# Patient Record
Sex: Female | Born: 1935 | Race: White | Hispanic: No | Marital: Single | Smoking: Never smoker
Health system: Southern US, Community
[De-identification: ages and names within clinical notes are randomized; demographics above are authoritative.]

## PROBLEM LIST (undated history)

## (undated) DIAGNOSIS — G809 Cerebral palsy, unspecified: Secondary | ICD-10-CM

## (undated) DIAGNOSIS — E785 Hyperlipidemia, unspecified: Secondary | ICD-10-CM

## (undated) DIAGNOSIS — R4189 Other symptoms and signs involving cognitive functions and awareness: Secondary | ICD-10-CM

## (undated) DIAGNOSIS — K5900 Constipation, unspecified: Secondary | ICD-10-CM

## (undated) DIAGNOSIS — J9601 Acute respiratory failure with hypoxia: Secondary | ICD-10-CM

## (undated) HISTORY — PX: OTHER SURGICAL HISTORY: SHX169

## (undated) HISTORY — PX: TONSILLECTOMY: SUR1361

## (undated) HISTORY — DX: Acute respiratory failure with hypoxia: J96.01

---

## 1998-11-15 ENCOUNTER — Other Ambulatory Visit: Admission: RE | Admit: 1998-11-15 | Discharge: 1998-11-15 | Payer: Self-pay | Admitting: Family Medicine

## 1999-08-25 ENCOUNTER — Encounter: Admission: RE | Admit: 1999-08-25 | Discharge: 1999-08-25 | Payer: Self-pay | Admitting: Family Medicine

## 1999-08-25 ENCOUNTER — Encounter: Payer: Self-pay | Admitting: Family Medicine

## 2001-06-17 ENCOUNTER — Emergency Department (HOSPITAL_COMMUNITY): Admission: EM | Admit: 2001-06-17 | Discharge: 2001-06-17 | Payer: Self-pay

## 2001-06-24 ENCOUNTER — Emergency Department (HOSPITAL_COMMUNITY): Admission: EM | Admit: 2001-06-24 | Discharge: 2001-06-24 | Payer: Self-pay | Admitting: Emergency Medicine

## 2002-11-26 ENCOUNTER — Encounter: Admission: RE | Admit: 2002-11-26 | Discharge: 2002-11-26 | Payer: Self-pay | Admitting: Family Medicine

## 2002-11-26 ENCOUNTER — Encounter: Payer: Self-pay | Admitting: Family Medicine

## 2008-01-28 ENCOUNTER — Encounter: Admission: RE | Admit: 2008-01-28 | Discharge: 2008-01-28 | Payer: Self-pay | Admitting: Family Medicine

## 2009-04-06 ENCOUNTER — Encounter: Admission: RE | Admit: 2009-04-06 | Discharge: 2009-04-06 | Payer: Self-pay | Admitting: Family Medicine

## 2009-08-26 ENCOUNTER — Emergency Department (HOSPITAL_COMMUNITY): Admission: EM | Admit: 2009-08-26 | Discharge: 2009-08-27 | Payer: Self-pay | Admitting: Emergency Medicine

## 2010-01-14 ENCOUNTER — Encounter: Admission: RE | Admit: 2010-01-14 | Discharge: 2010-01-14 | Payer: Self-pay | Admitting: Sports Medicine

## 2011-08-14 DIAGNOSIS — F4321 Adjustment disorder with depressed mood: Secondary | ICD-10-CM | POA: Diagnosis not present

## 2011-08-14 DIAGNOSIS — F411 Generalized anxiety disorder: Secondary | ICD-10-CM | POA: Diagnosis not present

## 2011-08-17 DIAGNOSIS — R062 Wheezing: Secondary | ICD-10-CM | POA: Diagnosis not present

## 2011-08-17 DIAGNOSIS — R498 Other voice and resonance disorders: Secondary | ICD-10-CM | POA: Diagnosis not present

## 2011-08-17 DIAGNOSIS — R05 Cough: Secondary | ICD-10-CM | POA: Diagnosis not present

## 2011-08-21 DIAGNOSIS — J159 Unspecified bacterial pneumonia: Secondary | ICD-10-CM | POA: Diagnosis not present

## 2011-08-21 DIAGNOSIS — F411 Generalized anxiety disorder: Secondary | ICD-10-CM | POA: Diagnosis not present

## 2011-08-21 DIAGNOSIS — I10 Essential (primary) hypertension: Secondary | ICD-10-CM | POA: Diagnosis not present

## 2011-10-05 DIAGNOSIS — G809 Cerebral palsy, unspecified: Secondary | ICD-10-CM | POA: Diagnosis not present

## 2011-10-05 DIAGNOSIS — B351 Tinea unguium: Secondary | ICD-10-CM | POA: Diagnosis not present

## 2011-10-05 DIAGNOSIS — M79609 Pain in unspecified limb: Secondary | ICD-10-CM | POA: Diagnosis not present

## 2011-10-05 DIAGNOSIS — M19079 Primary osteoarthritis, unspecified ankle and foot: Secondary | ICD-10-CM | POA: Diagnosis not present

## 2011-10-09 DIAGNOSIS — F4321 Adjustment disorder with depressed mood: Secondary | ICD-10-CM | POA: Diagnosis not present

## 2011-10-09 DIAGNOSIS — F411 Generalized anxiety disorder: Secondary | ICD-10-CM | POA: Diagnosis not present

## 2011-10-25 DIAGNOSIS — M1990 Unspecified osteoarthritis, unspecified site: Secondary | ICD-10-CM | POA: Diagnosis not present

## 2011-10-25 DIAGNOSIS — K5900 Constipation, unspecified: Secondary | ICD-10-CM | POA: Diagnosis not present

## 2011-10-25 DIAGNOSIS — G809 Cerebral palsy, unspecified: Secondary | ICD-10-CM | POA: Diagnosis not present

## 2011-10-25 DIAGNOSIS — F411 Generalized anxiety disorder: Secondary | ICD-10-CM | POA: Diagnosis not present

## 2011-11-27 DIAGNOSIS — F411 Generalized anxiety disorder: Secondary | ICD-10-CM | POA: Diagnosis not present

## 2011-11-30 DIAGNOSIS — E875 Hyperkalemia: Secondary | ICD-10-CM | POA: Diagnosis not present

## 2011-11-30 DIAGNOSIS — D649 Anemia, unspecified: Secondary | ICD-10-CM | POA: Diagnosis not present

## 2011-12-01 DIAGNOSIS — R059 Cough, unspecified: Secondary | ICD-10-CM | POA: Diagnosis not present

## 2011-12-01 DIAGNOSIS — R498 Other voice and resonance disorders: Secondary | ICD-10-CM | POA: Diagnosis not present

## 2011-12-01 DIAGNOSIS — R05 Cough: Secondary | ICD-10-CM | POA: Diagnosis not present

## 2011-12-01 DIAGNOSIS — J069 Acute upper respiratory infection, unspecified: Secondary | ICD-10-CM | POA: Diagnosis not present

## 2011-12-18 DIAGNOSIS — F411 Generalized anxiety disorder: Secondary | ICD-10-CM | POA: Diagnosis not present

## 2012-01-24 DIAGNOSIS — H3530 Unspecified macular degeneration: Secondary | ICD-10-CM | POA: Diagnosis not present

## 2012-01-24 DIAGNOSIS — H04129 Dry eye syndrome of unspecified lacrimal gland: Secondary | ICD-10-CM | POA: Diagnosis not present

## 2012-01-24 DIAGNOSIS — H52209 Unspecified astigmatism, unspecified eye: Secondary | ICD-10-CM | POA: Diagnosis not present

## 2012-01-24 DIAGNOSIS — Z961 Presence of intraocular lens: Secondary | ICD-10-CM | POA: Diagnosis not present

## 2012-01-28 ENCOUNTER — Encounter (HOSPITAL_COMMUNITY): Payer: Self-pay | Admitting: Emergency Medicine

## 2012-01-28 ENCOUNTER — Encounter (HOSPITAL_COMMUNITY)
Admission: EM | Disposition: A | Discharge status: Skilled Nursing Facility | Payer: Self-pay | Source: Ambulatory Visit | Attending: Internal Medicine

## 2012-01-28 ENCOUNTER — Inpatient Hospital Stay (HOSPITAL_COMMUNITY)
Admission: EM | Admit: 2012-01-28 | Discharge: 2012-01-31 | DRG: 382 | Disposition: A | Discharge status: Skilled Nursing Facility | Payer: Medicare Other | Source: Ambulatory Visit | Attending: Internal Medicine | Admitting: Internal Medicine

## 2012-01-28 DIAGNOSIS — Z5189 Encounter for other specified aftercare: Secondary | ICD-10-CM

## 2012-01-28 DIAGNOSIS — K2210 Ulcer of esophagus without bleeding: Secondary | ICD-10-CM | POA: Diagnosis not present

## 2012-01-28 DIAGNOSIS — K2211 Ulcer of esophagus with bleeding: Secondary | ICD-10-CM | POA: Diagnosis not present

## 2012-01-28 DIAGNOSIS — K920 Hematemesis: Secondary | ICD-10-CM | POA: Diagnosis present

## 2012-01-28 DIAGNOSIS — R58 Hemorrhage, not elsewhere classified: Secondary | ICD-10-CM | POA: Diagnosis not present

## 2012-01-28 DIAGNOSIS — G3184 Mild cognitive impairment, so stated: Secondary | ICD-10-CM | POA: Diagnosis present

## 2012-01-28 DIAGNOSIS — D500 Iron deficiency anemia secondary to blood loss (chronic): Secondary | ICD-10-CM | POA: Diagnosis not present

## 2012-01-28 DIAGNOSIS — K5900 Constipation, unspecified: Secondary | ICD-10-CM | POA: Diagnosis not present

## 2012-01-28 DIAGNOSIS — K259 Gastric ulcer, unspecified as acute or chronic, without hemorrhage or perforation: Secondary | ICD-10-CM | POA: Diagnosis not present

## 2012-01-28 DIAGNOSIS — D62 Acute posthemorrhagic anemia: Secondary | ICD-10-CM | POA: Diagnosis not present

## 2012-01-28 DIAGNOSIS — K2990 Gastroduodenitis, unspecified, without bleeding: Secondary | ICD-10-CM | POA: Diagnosis not present

## 2012-01-28 DIAGNOSIS — E876 Hypokalemia: Secondary | ICD-10-CM | POA: Diagnosis not present

## 2012-01-28 DIAGNOSIS — R112 Nausea with vomiting, unspecified: Secondary | ICD-10-CM | POA: Diagnosis not present

## 2012-01-28 DIAGNOSIS — K2901 Acute gastritis with bleeding: Secondary | ICD-10-CM | POA: Diagnosis not present

## 2012-01-28 DIAGNOSIS — F411 Generalized anxiety disorder: Secondary | ICD-10-CM | POA: Diagnosis not present

## 2012-01-28 DIAGNOSIS — K922 Gastrointestinal hemorrhage, unspecified: Secondary | ICD-10-CM | POA: Diagnosis present

## 2012-01-28 DIAGNOSIS — M2459 Contracture, other specified joint: Secondary | ICD-10-CM | POA: Diagnosis present

## 2012-01-28 DIAGNOSIS — D649 Anemia, unspecified: Secondary | ICD-10-CM | POA: Diagnosis present

## 2012-01-28 DIAGNOSIS — E785 Hyperlipidemia, unspecified: Secondary | ICD-10-CM

## 2012-01-28 DIAGNOSIS — R5381 Other malaise: Secondary | ICD-10-CM | POA: Diagnosis not present

## 2012-01-28 DIAGNOSIS — K449 Diaphragmatic hernia without obstruction or gangrene: Secondary | ICD-10-CM | POA: Diagnosis present

## 2012-01-28 DIAGNOSIS — G809 Cerebral palsy, unspecified: Secondary | ICD-10-CM | POA: Diagnosis present

## 2012-01-28 DIAGNOSIS — I499 Cardiac arrhythmia, unspecified: Secondary | ICD-10-CM | POA: Diagnosis not present

## 2012-01-28 HISTORY — DX: Constipation, unspecified: K59.00

## 2012-01-28 HISTORY — DX: Cerebral palsy, unspecified: G80.9

## 2012-01-28 HISTORY — DX: Hyperlipidemia, unspecified: E78.5

## 2012-01-28 HISTORY — DX: Hematemesis: K92.0

## 2012-01-28 HISTORY — PX: ESOPHAGOGASTRODUODENOSCOPY: SHX5428

## 2012-01-28 LAB — CBC WITH DIFFERENTIAL/PLATELET
Basophils Absolute: 0.1 10*3/uL (ref 0.0–0.1)
Basophils Relative: 1 % (ref 0–1)
Eosinophils Absolute: 0.1 10*3/uL (ref 0.0–0.7)
Hemoglobin: 13.7 g/dL (ref 12.0–15.0)
MCH: 29.1 pg (ref 26.0–34.0)
MCHC: 33.4 g/dL (ref 30.0–36.0)
Monocytes Absolute: 0.5 10*3/uL (ref 0.1–1.0)
Monocytes Relative: 7 % (ref 3–12)
Neutrophils Relative %: 68 % (ref 43–77)
RDW: 14.3 % (ref 11.5–15.5)

## 2012-01-28 LAB — TYPE AND SCREEN
ABO/RH(D): O POS
Antibody Screen: NEGATIVE

## 2012-01-28 LAB — COMPREHENSIVE METABOLIC PANEL
Albumin: 2.8 g/dL — ABNORMAL LOW (ref 3.5–5.2)
BUN: 15 mg/dL (ref 6–23)
Creatinine, Ser: 0.54 mg/dL (ref 0.50–1.10)
Potassium: 3.8 mEq/L (ref 3.5–5.1)
Total Protein: 5.5 g/dL — ABNORMAL LOW (ref 6.0–8.3)

## 2012-01-28 LAB — URINALYSIS, ROUTINE W REFLEX MICROSCOPIC
Glucose, UA: NEGATIVE mg/dL
pH: 7.5 (ref 5.0–8.0)

## 2012-01-28 LAB — URINE MICROSCOPIC-ADD ON

## 2012-01-28 LAB — PROTIME-INR: Prothrombin Time: 13.6 seconds (ref 11.6–15.2)

## 2012-01-28 LAB — HEMOGLOBIN: Hemoglobin: 12.7 g/dL (ref 12.0–15.0)

## 2012-01-28 LAB — OCCULT BLOOD, POC DEVICE: Fecal Occult Bld: POSITIVE

## 2012-01-28 LAB — HEMATOCRIT
HCT: 41.5 % (ref 36.0–46.0)
HCT: 38.2 % (ref 36.0–46.0)

## 2012-01-28 SURGERY — EGD (ESOPHAGOGASTRODUODENOSCOPY)
Anesthesia: Moderate Sedation

## 2012-01-28 MED ORDER — BUTAMBEN-TETRACAINE-BENZOCAINE 2-2-14 % EX AERO
INHALATION_SPRAY | CUTANEOUS | Status: DC | PRN
  Administered 2012-01-28: 2 via TOPICAL

## 2012-01-28 MED ORDER — MIDAZOLAM HCL 10 MG/2ML IJ SOLN
INTRAMUSCULAR | Status: AC
  Filled 2012-01-28: qty 4

## 2012-01-28 MED ORDER — ONDANSETRON HCL 4 MG/2ML IJ SOLN
INTRAMUSCULAR | Status: AC
  Filled 2012-01-28: qty 2

## 2012-01-28 MED ORDER — PANTOPRAZOLE SODIUM 40 MG IV SOLR
40.0000 mg | Freq: Two times a day (BID) | INTRAVENOUS | Status: DC
  Administered 2012-01-28 – 2012-01-31 (×6): 40 mg via INTRAVENOUS
  Filled 2012-01-28 (×8): qty 40

## 2012-01-28 MED ORDER — ONDANSETRON HCL 4 MG/2ML IJ SOLN
4.0000 mg | Freq: Once | INTRAMUSCULAR | Status: AC
  Administered 2012-01-28: 4 mg via INTRAVENOUS

## 2012-01-28 MED ORDER — ONDANSETRON HCL 4 MG/2ML IJ SOLN: 4.0000 mg | Freq: Four times a day (QID) | INTRAMUSCULAR | Status: DC | PRN

## 2012-01-28 MED ORDER — CARBOXYMETHYLCELLULOSE SODIUM 0.5 % OP SOLN: 1.0000 [drp] | Freq: Three times a day (TID) | OPHTHALMIC | Status: DC | PRN

## 2012-01-28 MED ORDER — PANTOPRAZOLE SODIUM 40 MG IV SOLR
40.0000 mg | Freq: Once | INTRAVENOUS | Status: AC
  Administered 2012-01-28: 40 mg via INTRAVENOUS

## 2012-01-28 MED ORDER — POLYVINYL ALCOHOL 1.4 % OP SOLN
1.0000 [drp] | Freq: Three times a day (TID) | OPHTHALMIC | Status: DC | PRN
  Filled 2012-01-28: qty 15

## 2012-01-28 MED ORDER — SODIUM CHLORIDE 0.9 % IJ SOLN
3.0000 mL | Freq: Two times a day (BID) | INTRAMUSCULAR | Status: DC
  Administered 2012-01-29 – 2012-01-31 (×4): 3 mL via INTRAVENOUS

## 2012-01-28 MED ORDER — SODIUM CHLORIDE 0.9 % IV SOLN
INTRAVENOUS | Status: DC
  Administered 2012-01-28: 75 mL via INTRAVENOUS
  Administered 2012-01-29: 06:00:00 via INTRAVENOUS

## 2012-01-28 MED ORDER — ACETAMINOPHEN 325 MG PO TABS
650.0000 mg | ORAL_TABLET | Freq: Four times a day (QID) | ORAL | Status: DC | PRN
  Administered 2012-01-29 – 2012-01-30 (×4): 650 mg via ORAL
  Filled 2012-01-28 (×5): qty 2

## 2012-01-28 MED ORDER — FENTANYL CITRATE 0.05 MG/ML IJ SOLN
INTRAMUSCULAR | Status: AC
  Filled 2012-01-28: qty 4

## 2012-01-28 MED ORDER — MIDAZOLAM HCL 10 MG/2ML IJ SOLN
INTRAMUSCULAR | Status: DC | PRN
  Administered 2012-01-28: 1 mg via INTRAVENOUS
  Administered 2012-01-28: 2 mg via INTRAVENOUS
  Administered 2012-01-28: 1 mg via INTRAVENOUS

## 2012-01-28 MED ORDER — SODIUM CHLORIDE 0.9 % IV SOLN
Freq: Once | INTRAVENOUS | Status: AC
  Administered 2012-01-28: 05:00:00 via INTRAVENOUS

## 2012-01-28 MED ORDER — FENTANYL CITRATE 0.05 MG/ML IJ SOLN
INTRAMUSCULAR | Status: DC | PRN
  Administered 2012-01-28: 25 ug via INTRAVENOUS
  Administered 2012-01-28 (×2): 12.5 ug via INTRAVENOUS

## 2012-01-28 MED ORDER — ALBUTEROL SULFATE (5 MG/ML) 0.5% IN NEBU: 2.5000 mg | INHALATION_SOLUTION | RESPIRATORY_TRACT | Status: DC | PRN

## 2012-01-28 MED ORDER — ONDANSETRON HCL 4 MG PO TABS: 4.0000 mg | ORAL_TABLET | Freq: Four times a day (QID) | ORAL | Status: DC | PRN

## 2012-01-28 MED ORDER — ACETAMINOPHEN 650 MG RE SUPP: 650.0000 mg | Freq: Four times a day (QID) | RECTAL | Status: DC | PRN

## 2012-01-28 MED ORDER — SODIUM CHLORIDE 0.9 % IV SOLN: Freq: Once | INTRAVENOUS | Status: DC

## 2012-01-28 NOTE — H&P (Signed)
PCP:   Oneal Grout, MD   Chief Complaint:  Vomiting black material.  HPI: 76 year old female patient, nursing home resident, with past medical history of cerebral palsy, lower extremity contractures, hyperlipidemia not on medications, no prior GI bleed was sent to the emergency department secondary to vomiting black material. Patient gives history of vomiting about 3 times approximately 3 weeks ago. At that time there was no coffee grounds or blood. This was not associated with abdominal pain or diarrhea. It resolved spontaneously. According to her first cousins at the bedside, this was probably secondary to overeating. Last night at approximately 9:25 PM, patient spontaneously started vomiting black colored material. There was no bright red blood. This was not associated with abdominal pain or black stools. No dizziness or lightheadedness. She had 2 such episodes at the nursing facility and she was transferred to the emergency department for further evaluation and management. In the emergency department she had 2 more episodes and the last one approximately 2 hours ago. The emesis tested positive for occult blood. Rectal exam by ED physician did not reveal melena and tested positive for blood. Patient denies using NSAIDs. Appetite is good. No history of weight loss. Last colonoscopy approximately 15 years ago. Patient has received IV Protonix in the hospitalist service is requested to admit for further evaluation and management. Gastroenterology has been consulted by the EDP.  Past Medical History: Past Medical History  Diagnosis Date  . Constipation   . Cerebral palsy   . Hyperlipidemia     Past Surgical History: Past Surgical History  Procedure Date  . Right hip surgery   . Tonsillectomy     Allergies:  No Known Allergies  Medications: Prior to Admission medications   Medication Sig Start Date End Date Taking? Authorizing Provider  butalbital-acetaminophen-caffeine (FIORICET,  ESGIC) 50-325-40 MG per tablet Take 1 tablet by mouth 2 (two) times daily as needed. headache   Yes Historical Provider, MD  carboxymethylcellulose (REFRESH PLUS) 0.5 % SOLN Place 1 drop into both eyes 3 (three) times daily as needed.   Yes Historical Provider, MD  citalopram (CELEXA) 20 MG tablet Take 20 mg by mouth daily.   Yes Historical Provider, MD  polyethylene glycol (MIRALAX / GLYCOLAX) packet Take 17 g by mouth at bedtime.   Yes Historical Provider, MD  promethazine (PHENERGAN) 25 MG suppository Place 25 mg rectally every 12 (twelve) hours as needed. nausea   Yes Historical Provider, MD    Family History: History reviewed. No pertinent family history. Patient and her first cousins at the bedside deny any family history.  Social History:  reports that she has never smoked. She does not have any smokeless tobacco history on file. She reports that she does not drink alcohol or use illicit drugs. patient is a resident of a skilled nursing facility. She is mostly wheelchair mobile but ambulates with the help of a walker. She has 4 cousins that look out for her and Mr. Marikay Alar is her healthcare power of attorney.  Review of Systems:  All systems reviewed and the proximal history of presenting illness, is negative. Specifically no dysuria or urinary frequency or fever or chills. No chest pain or dyspnea. Occasional headaches.  Physical Exam: Filed Vitals:   01/28/12 0600 01/28/12 0615 01/28/12 0645 01/28/12 0700  BP: 150/89 135/106 134/83 143/78  Pulse: 97 100 99 95  Temp:      TempSrc:      Resp: 19 23 21 18   SpO2: 96% 95% 99% 97%  General appearance: Moderately built and nourished female patient who is lying comfortably in the gurney and is in no obvious distress.  Head: Nontraumatic and normocephalic.  Eyes: Pupils equally reacting to light and accommodation.  Ears: Normal  Nose: No acute findings. No sinus tenderness.  Throat: Mucosa is moist. No oral thrush. No blood or  coffee grounds. Neck: Supple. No JVD or carotid bruit. Lymph nodes: No lymphadenopathy.  Resp: Clear to auscultation. No increased work of breathing.  Cardio: First and second heart sounds heard, regular rate and rythm. No murmurs or JVD or gallop or pedal edema.   GI: Non distended. Soft and nontender. No organomegaly or masses appreciated. Normal bowel sounds heard.  Extremities: Bilateral lower extremity muscle wasting and contractures. Grade 2/2 power at least in lower extremities. Symmetric 5/5 power in upper extremities.  Skin: No other acute findings.  Neurologic: Alert and oriented. No focal neurological deficits.   Labs on Admission:   [facility] 01/28/12 0536  NA 140  K 3.8  CL 107  CO2 24  GLUCOSE 104*  BUN 15  CREATININE 0.54  CALCIUM 8.3*  MG --  PHOS --    Basename 01/28/12 0536  AST 14  ALT 9  ALKPHOS 61  BILITOT 0.3  PROT 5.5*  ALBUMIN 2.8*   No results found for this basename: LIPASE:2,AMYLASE:2 in the last 72 hours  Basename 01/28/12 0536  WBC 7.1  NEUTROABS 4.9  HGB 13.7  HCT 41.0  MCV 87.0  PLT 206   No results found for this basename: CKTOTAL:3,CKMB:3,CKMBINDEX:3,TROPONINI:3 in the last 72 hours No results found for this basename: TSH,T4TOTAL,FREET3,T3FREE,THYROIDAB in the last 72 hours No results found for this basename: VITAMINB12:2,FOLATE:2,FERRITIN:2,TIBC:2,IRON:2,RETICCTPCT:2 in the last 72 hours  Radiological Exams on Admission: No results found.    Assessment/Plan Present on Admission:  .Acute upper GI bleed  1. Acute upper GI bleed: Differential diagnosis: Mallory-Weiss tear, gastritis or peptic ulcer disease (however no other symptoms such as abdominal pain, weight loss or decreased appetite). Admit to telemetry. Clear liquids by mouth. IV PPI. Monitor H&H frequently. GI consult and by EDP-will wait for recommendations-conservative management versus EGD. 2. History of hyperlipidemia: Patient was taken off Lipitor by her previous  PCP Dr. Lupe Carney, secondary to myalgia. 3. History of cerebral palsy with lower extremity contractures and possible mild cognitive impairment 4. Full Code: This was confirmed with the patient in the presence of a family.  Discussed the patient's care with the patient and her cousins Ms. Blondell Reveal and Mr. Julio Storr at the bedside, answered questions and updated care.  Shaima Sardinas 01/28/2012, 8:34 AM

## 2012-01-28 NOTE — ED Notes (Addendum)
MD at bedside. Dr. Hongalgi 

## 2012-01-28 NOTE — ED Provider Notes (Signed)
Medical screening examination/treatment/procedure(s) were conducted as a shared visit with non-physician practitioner(s) and myself.  I personally evaluated the patient during the encounter  Pt with upper GI bleed, though BP stable, HGB stable.  She did have coffee ground emesis here.  She denies pain or other complaints at this time D/w triad to admit Also, d/w dr schooler with GI he is aware of patient BP 143/78  Pulse 95  Temp 98.5 F (36.9 C) (Oral)  Resp 18  SpO2 97%   Joya Gaskins, MD 01/28/12 [phone]

## 2012-01-28 NOTE — Brief Op Note (Addendum)
Large distal esophageal ulcer and edematous and nodular GEJ concerning for a lesion. Biopsies taken. Patient still sedated postprocedure. Clears when awake. IV PPI Q 12 hours. See Endopro note.

## 2012-01-28 NOTE — ED Provider Notes (Signed)
History     CSN: [id number]  Arrival date & time 01/28/12  4098   First MD Initiated Contact with Patient 01/28/12 0440      Chief Complaint  Patient presents with  . Hematemesis    (Consider location/radiation/quality/duration/timing/severity/associated sxs/prior treatment) HPI Comments: Patient here with vomiting dark coffee ground emesis since this evening - patient reports normal appetite this evening ate all of her dinner and snack, she states that starting at 2300 she began to feel nauseous and asked for a nausea pill - states they gave her phenergan suppository instead which has not helped - she states that starting at about 3am she began to vomit - she reports dark black coffee ground emesis x 4 - denies bright red blood - denies fever, chills, abdominal pain, constipation or diarrhea, denies blood in stool recently and reports no prior history of GI bleed - she states she is not on coumadin or blood thinners.  Patient is a 76 y.o. female presenting with vomiting. The history is provided by the patient, the nursing home and the EMS personnel. No language interpreter was used.  Emesis  This is a new problem. The current episode started 1 to 2 hours ago. The problem occurs 2 to 4 times per day. The problem has not changed since onset.Vomiting appearance: black coffee ground emesis. There has been no fever. Pertinent negatives include no abdominal pain, no arthralgias, no chills, no cough, no diarrhea, no fever, no headaches, no myalgias, no sweats and no URI.    Past Medical History  Diagnosis Date  . Constipation   . Cerebral palsy     History reviewed. No pertinent past surgical history.  History reviewed. No pertinent family history.  History  Substance Use Topics  . Smoking status: Never Smoker   . Smokeless tobacco: Not on file  . Alcohol Use: No    OB History    Grav Para Term Preterm Abortions TAB SAB Ect Mult Living                  Review of Systems    Constitutional: Negative for fever and chills.  HENT: Negative for neck pain.   Eyes: Negative for pain.  Respiratory: Negative for cough.   Gastrointestinal: Positive for nausea and vomiting. Negative for abdominal pain, diarrhea, blood in stool and rectal pain.  Genitourinary: Negative for dysuria.  Musculoskeletal: Negative for myalgias and arthralgias.  Neurological: Negative for headaches.  All other systems reviewed and are negative.    Allergies  Review of patient's allergies indicates no known allergies.  Home Medications  No current outpatient prescriptions on file.  BP 149/85  Pulse 104  Temp 98.5 F (36.9 C) (Oral)  Resp 27  SpO2 97%  Physical Exam  Nursing note and vitals reviewed. Constitutional: She is oriented to person, place, and time. She appears well-developed and well-nourished. No distress.  HENT:  Head: Normocephalic and atraumatic.  Right Ear: External ear normal.  Left Ear: External ear normal.  Nose: Nose normal.  Mouth/Throat: Oropharynx is clear and moist. No oropharyngeal exudate.  Eyes: Conjunctivae are normal. Pupils are equal, round, and reactive to light. No scleral icterus.  Neck: Normal range of motion. Neck supple.  Cardiovascular: Normal rate, regular rhythm and normal heart sounds.  Exam reveals no gallop and no friction rub.   No murmur heard. Pulmonary/Chest: Effort normal and breath sounds normal. No respiratory distress. She has no wheezes. She has no rales. She exhibits no tenderness.  Abdominal: Soft.  Bowel sounds are normal. She exhibits no distension and no mass. There is no tenderness. There is no rebound and no guarding.  Musculoskeletal: Normal range of motion. She exhibits no edema and no tenderness.  Lymphadenopathy:    She has no cervical adenopathy.  Neurological: She is alert and oriented to person, place, and time. No cranial nerve deficit. She exhibits normal muscle tone. Coordination normal.  Skin: Skin is warm and  dry. No rash noted. No erythema. No pallor.  Psychiatric: She has a normal mood and affect. Her behavior is normal. Judgment and thought content normal.    ED Course  Procedures (including critical care time)  Labs Reviewed - No data to display No results found.   No diagnosis found.    MDM  Patient here with upper and lower GI bleed acutely - she has no prior history of GI bleed, denies any pain currently but feel that she will likely need admission for this as it is a new event.  As she is not having pain, I do not feel that imaging is necessary at this time - patient to be followed by Dr. Bebe Shaggy who will get the patient admitted.        Izola Price [place], [place] 01/28/12 [phone]

## 2012-01-28 NOTE — ED Notes (Addendum)
Per EMS:  Pt comes from [facility] and has been vomiting dark red blood since 2100 yesterday about 3-4 episodes of emesis.    Pt has had 4mg  zofran PTA

## 2012-01-28 NOTE — ED Notes (Signed)
Put pt on a bedpan, when getting her off she started vomiting. Pt was cleaned up and put a new gown on.

## 2012-01-28 NOTE — Progress Notes (Signed)
01/28/12 0836  OTHER  CSW Follow Up Status Follow-up required     Unit based LCSW to be consulted as pt is from [facility].   Dionne Milo MSW [facility] Emergency Dept. Weekend/Social Worker [phone]

## 2012-01-28 NOTE — ED Notes (Signed)
Family at bedside. 

## 2012-01-28 NOTE — Interval H&P Note (Signed)
History and Physical Interval Note:  01/28/2012 1:42 PM  [name]  has presented today for surgery, with the diagnosis of coffee ground emesis  The various methods of treatment have been discussed with the patient and family. After consideration of risks, benefits and other options for treatment, the patient has consented to  Procedure(s) (LRB): ESOPHAGOGASTRODUODENOSCOPY (EGD) (N/A) as a surgical intervention .  The patient's history has been reviewed, patient examined, no change in status, stable for surgery.  I have reviewed the patients' chart and labs.  Questions were answered to the patient's satisfaction.     Jody Silas C.

## 2012-01-28 NOTE — Progress Notes (Signed)
01/28/12 0835  Discharge Planning  Type of Residence Skilled nursing facility  Care Facility Name [facility] No  Support Systems Other relatives Tajuanna Burnett [phone])  Do you have any problems obtaining your medications? No  Family/patient expects to be discharged to: Skilled nursing facility  Once you are discharged, how will you get to your follow-up appointment? Other (Comment) (Facility)  Expected Discharge Date 02/02/12  Case Management Consult Needed No  Social Work Consult Needed Yes (Comment)

## 2012-01-28 NOTE — Consult Note (Signed)
Referring Provider: Dr. Zadie Rhine Primary Care Physician:  Oneal Grout, MD Primary Gastroenterologist:  Gentry Fitz  Reason for Consultation:  Coffee grounds emesis  HPI: [name] is a 76 y.o. female with cerebral palsy who is accompanied by two of her cousins and had the acute onset of coffee grounds emesis last night at "9:25pm" The vomiting of the black fluid occurred twice at the nursing home and then occurred again in the ER at 0600AM and was described by her family as a large amount. Denies any associated abdominal pain, denies melena/hematochezia. Denies any preceding nausea. No NSAIDs. Never had this happen before. Thinks she had an EGD years ago and had a colonoscopy over 15 years ago. Reports having non-bloody vomiting 3 weeks ago that she thinks was due to overeating.      Past Medical History  Diagnosis Date  . Constipation   . Cerebral palsy   . Hyperlipidemia     Past Surgical History  Procedure Date  . Right hip surgery   . Tonsillectomy     Prior to Admission medications   Medication Sig Start Date End Date Taking? Authorizing Provider  butalbital-acetaminophen-caffeine (FIORICET, ESGIC) 50-325-40 MG per tablet Take 1 tablet by mouth 2 (two) times daily as needed. headache   Yes Historical Provider, MD  carboxymethylcellulose (REFRESH PLUS) 0.5 % SOLN Place 1 drop into both eyes 3 (three) times daily as needed.   Yes Historical Provider, MD  citalopram (CELEXA) 20 MG tablet Take 20 mg by mouth daily.   Yes Historical Provider, MD  polyethylene glycol (MIRALAX / GLYCOLAX) packet Take 17 g by mouth at bedtime.   Yes Historical Provider, MD  promethazine (PHENERGAN) 25 MG suppository Place 25 mg rectally every 12 (twelve) hours as needed. nausea   Yes Historical Provider, MD    Scheduled Meds:   . sodium chloride   Intravenous Once  . ondansetron (ZOFRAN) IV  4 mg Intravenous Once  . pantoprazole (PROTONIX) IV  40 mg Intravenous Once   Continuous  Infusions:  PRN Meds:.  Allergies as of 01/28/2012  . (No Known Allergies)    History reviewed. No pertinent family history.  History   Social History  . Marital Status: Single    Spouse Name: N/A    Number of Children: N/A  . Years of Education: N/A   Occupational History  . Not on file.   Social History Main Topics  . Smoking status: Never Smoker   . Smokeless tobacco: Not on file  . Alcohol Use: No  . Drug Use: No  . Sexually Active:    Other Topics Concern  . Not on file   Social History Narrative  . No narrative on file    Review of Systems: All negative except as stated above in HPI.  Physical Exam: Vital signs: Filed Vitals:   01/28/12 1019  BP: 124/72  Pulse: 86  Temp: 98.5  Resp: 18     General:   Elderly, Awake, Alert,  Thin, lower extremity contractures, NAD  Lungs:  Clear throughout to auscultation.   No wheezes, crackles, or rhonchi. No acute distress. Heart:  Regular rate and rhythm; no murmurs, clicks, rubs,  or gallops. Abdomen: soft, NT, ND, +BS  Rectal:  Deferred Ext: LE contractures, no edema  GI:  Lab Results:  Basename 01/28/12 0536  WBC 7.1  HGB 13.7  HCT 41.0  PLT 206   BMET  Basename 01/28/12 0536  NA 140  K 3.8  CL 107  CO2 24  GLUCOSE 104*  BUN 15  CREATININE 0.54  CALCIUM 8.3*   LFT  Basename 01/28/12 0536  PROT 5.5*  ALBUMIN 2.8*  AST 14  ALT 9  ALKPHOS 61  BILITOT 0.3  BILIDIR --  IBILI --   PT/INR  Basename 01/28/12 0830  LABPROT 13.6  INR 1.02     Studies/Results: No results found.  Impression/Plan: 76yo with coffee grounds emesis X 3 without any abdominal pain or melena and no history of ulcers or NSAIDs. EGD planned for today to look for peptic ulcer disease. Continue IV PPI Q 12 hours. NPO. IVFs.    LOS: 0 days   Doniven Vanpatten C.  01/28/2012, 11:07 AM

## 2012-01-28 NOTE — Op Note (Signed)
[facility] [address] [place], [place]  [address]  ENDOSCOPY PROCEDURE REPORT  PATIENT:  [name], [name]  MR#:  [id number] BIRTHDATE:  05/06/1936, 76 yrs. old  GENDER:  female  ENDOSCOPIST:  Charlott Rakes, MD Referred by:  PROCEDURE DATE:  01/28/2012 PROCEDURE:  EGD with biopsy, 43239 ASA CLASS:  Class II INDICATIONS:  hematemesis  MEDICATIONS:   Fentanyl 50 mcg IV, Versed 4 mg IV, Cetacaine spray x 2  TOPICAL ANESTHETIC:  DESCRIPTION OF PROCEDURE:   After the risks benefits and alternatives of the procedure were thoroughly explained, informed consent was obtained.  The Pentax Gastroscope [id number] endoscope was introduced through the mouth and advanced to the second portion of the duodenum, without limitations.  The instrument was slowly withdrawn as the mucosa was fully examined. <<PROCEDUREIMAGES>>  FINDINGS:  The endoscope was inserted into the oropharynx and esophagus was intubated.  The gastroesophageal junction was noted to be 30 cm from the incisors. Endoscope was advanced into the stomach  which revealed clear fluid in the stomach without any mucosal abnormalities The endoscope was advanced to the duodenal bulb and second portion of duodenum which were unremarkable.  The endoscope was withdrawn back into the stomach and retroflexion was done which revealed edematous mucosa in the cardia with a medium-sized hiatal hernia and normal-appearing fundus. The endoscope was straightened and upon withdrawing back into the GE junction it was noted to be ulcerated circumferentially with an area of edema and erythema that covered half of the circumference of the lumen. This area was nodular and concerning for a gastroesophageal lesion. It was noted at the proximal portion of the hiatal hernia at the Z-line. The hiatal hernia extended from 30 cm from the incisors to 35 cm from the incisors. The circumferential esophageal ulcer was adjacent to  this gastroesophageal junction lesion. The circumferential ulcer extended from 30 cm to 25 cm from the incisors. Biopsies were taken of the lesion to send for histologic purposes in appearance of the lesion was concerning for adenocarcinoma.  COMPLICATIONS:  None  ENDOSCOPIC IMPRESSION:  1. Gastroesophageal junction lesion - status post biopsies of lesion 2. Large distal esophageal ulcer 3. Medium-sized hiatal hernia  RECOMMENDATIONS:    1. F/U on path 2. IV PPI Q 12 hours 3. Clear liquid diet  REPEAT EXAM:  N/A  ______________________________ Charlott Rakes, MD  CC:  n. eSIGNEDCharlott Rakes at 01/28/2012 02:26 PM  Page 2 of 3   Aquilla Hacker, [phone]

## 2012-01-29 ENCOUNTER — Encounter (HOSPITAL_COMMUNITY): Payer: Self-pay | Admitting: Gastroenterology

## 2012-01-29 DIAGNOSIS — E876 Hypokalemia: Secondary | ICD-10-CM | POA: Diagnosis not present

## 2012-01-29 DIAGNOSIS — K922 Gastrointestinal hemorrhage, unspecified: Secondary | ICD-10-CM

## 2012-01-29 DIAGNOSIS — D649 Anemia, unspecified: Secondary | ICD-10-CM

## 2012-01-29 DIAGNOSIS — K2210 Ulcer of esophagus without bleeding: Secondary | ICD-10-CM | POA: Diagnosis present

## 2012-01-29 HISTORY — DX: Hypokalemia: E87.6

## 2012-01-29 LAB — CBC
HCT: 35.7 % — ABNORMAL LOW (ref 36.0–46.0)
Hemoglobin: 11.8 g/dL — ABNORMAL LOW (ref 12.0–15.0)
MCH: 29.1 pg (ref 26.0–34.0)
MCV: 87.9 fL (ref 78.0–100.0)
Platelets: 207 10*3/uL (ref 150–400)
RBC: 4.06 MIL/uL (ref 3.87–5.11)
WBC: 6.1 10*3/uL (ref 4.0–10.5)

## 2012-01-29 LAB — BASIC METABOLIC PANEL
CO2: 23 mEq/L (ref 19–32)
Calcium: 8 mg/dL — ABNORMAL LOW (ref 8.4–10.5)
Chloride: 109 mEq/L (ref 96–112)
Glucose, Bld: 88 mg/dL (ref 70–99)
Potassium: 3.1 mEq/L — ABNORMAL LOW (ref 3.5–5.1)
Sodium: 141 mEq/L (ref 135–145)

## 2012-01-29 MED ORDER — POTASSIUM CHLORIDE CRYS ER 20 MEQ PO TBCR
40.0000 meq | EXTENDED_RELEASE_TABLET | Freq: Once | ORAL | Status: AC
  Administered 2012-01-29: 40 meq via ORAL
  Filled 2012-01-29: qty 2

## 2012-01-29 MED ORDER — POLYETHYLENE GLYCOL 3350 17 G PO PACK
17.0000 g | PACK | Freq: Every day | ORAL | Status: DC
  Administered 2012-01-29 – 2012-01-30 (×2): 17 g via ORAL
  Filled 2012-01-29 (×3): qty 1

## 2012-01-29 MED ORDER — CITALOPRAM HYDROBROMIDE 20 MG PO TABS
20.0000 mg | ORAL_TABLET | Freq: Every day | ORAL | Status: DC
  Administered 2012-01-29 – 2012-01-31 (×3): 20 mg via ORAL
  Filled 2012-01-29 (×3): qty 1

## 2012-01-29 NOTE — Clinical Social Work Psychosocial (Signed)
     Clinical Social Work Department BRIEF PSYCHOSOCIAL ASSESSMENT 01/29/2012  Patient:  [name], [name]     Account Number:  [id number]     Admit date:  01/28/2012  Clinical Social Worker:  Jacelyn Grip  Date/Time:  01/29/2012 03:30 PM  Referred by:  CSW  Date Referred:  01/29/2012 Referred for  SNF Placement   Other Referral:   Interview type:  Patient Other interview type:   and patient cousin, Karoline Fleer    PSYCHOSOCIAL DATA Living Status:  FACILITY Admitted from facility:  [facility] Level of care:  Skilled Nursing Facility Primary support name:  Vernona Rieger Ildefonso/cousin/[phone] Primary support relationship to patient:  FAMILY Degree of support available:   adequate    CURRENT CONCERNS Current Concerns  Post-Acute Placement   Other Concerns:    SOCIAL WORK ASSESSMENT / PLAN CSW reviewed chart and noted pt admitted from [facility]. CSW met with pt at bedside to discuss disposition planning. Pt confirmed that she is currently a resident at [facility] and is interested in exploring other facilities. Pt reported a few concerns that she has at facility. Pt provided permission for this CSW to contact pt cousin, Lyncoln Maskell to discuss. CSW contacted pt cousin, Alece Koppel who discussed that pt was placed into [facility] from home and pt is long term at [facility]. Pt cousin stated that pt has not used any Medicare days for placement and would like to clarify with other facilities if Medicare days could be used if Medicaid bed is not immediately available. Pt and pt cousin both report interest in [facility] and Rehab. CSW completed FL2 and initiated SNF search in [facility] with the exemption of [employer]. CSW to follow up with pt and pt cousin in regard to bed offers. CSW notified MD of pt interest in exploring other facilities and requested for pt to have PT evaluation. CSW to facilitate pt discharge needs when pt medically  stable for discharge.   Assessment/plan status:  Psychosocial Support/Ongoing Assessment of Needs Other assessment/ plan:   discharge planning   Information/referral to community resources:   [facility] list    PATIENTS/FAMILYS RESPONSE TO PLAN OF CARE: Pt alert and oriented x 4. Pt stated that she has been a resident at [facility] since May 2012 and hopeful for a new placement. Pt niece appears supportive and actively involved in pt care.

## 2012-01-29 NOTE — Progress Notes (Signed)
Subjective:   No further episodes of vomiting since yesterday morning. Tolerating clear liquids. Denies any other complaints. Last BM 2 days ago.  Objective  Vital signs in last 24 hours: Filed Vitals:   01/28/12 1550 01/28/12 2105 01/29/12 0621 01/29/12 1415  BP: 104/55 99/64 103/64 121/72  Pulse:  72 81 81  Temp:  98.2 F (36.8 C) 98.8 F (37.1 C) 98.1 F (36.7 C)  TempSrc:  Oral Oral Oral  Resp: 15 16 18 18   Height:      Weight:      SpO2: 96% 91% 91% 92%   Weight change:   Intake/Output Summary (Last 24 hours) at 01/29/12 1426 Last data filed at 01/29/12 0859  Gross per 24 hour  Intake   1180 ml  Output      0 ml  Net   1180 ml    Physical Exam:  General Exam: Comfortable.  Respiratory System: Clear. No increased work of breathing.  Cardiovascular System: First and second heart sounds heard. Regular rate and rhythm. No JVD/murmurs.  Gastrointestinal System: Abdomen is non distended, soft and normal bowel sounds heard. Non-tender. Central Nervous System: Alert and oriented. No focal neurological deficits. Extremities: Bilateral lower extremity contractures.  Labs:  Basic Metabolic Panel:  Lab 01/28/12 1610 01/28/12 0536  NA 141 140  K 3.1* 3.8  CL 109 107  CO2 23 24  GLUCOSE 88 104*  BUN 8 15  CREATININE 0.55 0.54  CALCIUM 8.0* 8.3*  ALB -- --  PHOS -- --   Liver Function Tests:  Lab 01/28/12 0536  AST 14  ALT 9  ALKPHOS 61  BILITOT 0.3  PROT 5.5*  ALBUMIN 2.8*   No results found for this basename: LIPASE:3,AMYLASE:3 in the last 168 hours No results found for this basename: AMMONIA:3 in the last 168 hours CBC:  Lab 01/28/12 2306 01/28/12 2030 01/28/12 1200 01/28/12 0536  WBC 6.1 -- -- 7.1  NEUTROABS -- -- -- 4.9  HGB 11.8* 12.7 13.7 --  HCT 35.7* 38.2 41.5 --  MCV 87.9 -- -- 87.0  PLT 207 -- -- 206   Cardiac Enzymes: No results found for this basename: CKTOTAL:5,CKMB:5,CKMBINDEX:5,TROPONINI:5 in the last 168 hours CBG: No results  found for this basename: GLUCAP:5 in the last 168 hours  Iron Studies: No results found for this basename: IRON,TIBC,TRANSFERRIN,FERRITIN in the last 72 hours Studies/Results: No results found. Medications:    . DISCONTD: sodium chloride 75 mL/hr at 01/29/12 0531      . citalopram  20 mg Oral Daily  . pantoprazole (PROTONIX) IV  40 mg Intravenous Q12H  . polyethylene glycol  17 g Oral QHS  . potassium chloride  40 mEq Oral Once  . sodium chloride  3 mL Intravenous Q12H  . DISCONTD: sodium chloride   Intravenous Once    I  have reviewed scheduled and prn medications.     Problem/Plan: Principal Problem:  *Acute upper GI bleed Active Problems:  Hematemesis  1. Acute upper GI bleed: Secondary to distal esophageal ulcer. EGD also showed nodular GEJ concerning for malignancy-biopsy pending. No further episodes of vomiting or hematemesis. Advancing to full liquid diet. Continue IV twice a day PPI. GI following. 2. Hypokalemia: Replete and follow BMP in a.m. 3. Anemia:? Dilutional. No overt bleeding. Follow up CBC in a.m. 4. History of hyperlipidemia: Patient was taken off Lipitor by her previous PCP Dr. Lupe Carney, secondary to myalgia. 5. History of cerebral palsy with lower extremity contractures and possible mild cognitive impairment  6. Full Code: This was confirmed with the patient in the presence of a family  Disposition: If stable, possible discharge 7/2.  [name] 01/29/2012,2:26 PM  LOS: 1 day

## 2012-01-29 NOTE — Progress Notes (Signed)
Patient ID: [name], female   DOB: 1935-10-26, 76 y.o.   MRN: [id number] [facility] Gastroenterology Progress Note  [name] 76 y.o. 1936-03-12   Subjective: Sitting up in bed and just finished clear liquids. Tolerated well without nausea or pain. Denies abdominal pain. No BMs or flatus.  Objective: Vital signs: Filed Vitals:   01/29/12 0621  BP: 103/64  Pulse: 81  Temp: 98.8 F (37.1 C)  Resp: 18    Physical Exam: Gen: alert, elderly, no acute distress   Lab Results:  [facility] 01/28/12 2306 01/28/12 0536  NA 141 140  K 3.1* 3.8  CL 109 107  CO2 23 24  GLUCOSE 88 104*  BUN 8 15  CREATININE 0.55 0.54  CALCIUM 8.0* 8.3*  MG -- --  PHOS -- --    Basename 01/28/12 0536  AST 14  ALT 9  ALKPHOS 61  BILITOT 0.3  PROT 5.5*  ALBUMIN 2.8*    Basename 01/28/12 2306 01/28/12 2030 01/28/12 0536  WBC 6.1 -- 7.1  NEUTROABS -- -- 4.9  HGB 11.8* 12.7 --  HCT 35.7* 38.2 --  MCV 87.9 -- 87.0  PLT 207 -- 206      Assessment/Plan: 76yo with distal esophageal ulcer and nodular GEJ concerning for malignancy. S/P biopsies that are pending. No more hematemesis. Continue IV PPI Q 12 hours. Will change to full liquids now and if tolerates advance further tomorrow.   Jaysiah Marchetta C. 01/29/2012, 1:08 PM

## 2012-01-29 NOTE — Care Management Note (Signed)
    Page 1 of 1   01/31/2012     1:15:40 PM   CARE MANAGEMENT NOTE 01/31/2012  Patient:  [name], [name]   Account Number:  [id number]  Date Initiated:  01/29/2012  Documentation initiated by:  Letha Cape  Subjective/Objective Assessment:   dx upper gib  admit- from [facility] SNF     Action/Plan:   Anticipated DC Date:  01/31/2012   Anticipated DC Plan:  SKILLED NURSING FACILITY  In-house referral  Clinical Social Worker      DC Planning Services  CM consult      Choice offered to / List presented to:             Status of service:  Completed, signed off Medicare Important Message given?   (If response is "NO", the following Medicare IM given date fields will be blank) Date Medicare IM given:   Date Additional Medicare IM given:    Discharge Disposition:  SKILLED NURSING FACILITY  Per UR Regulation:  Reviewed for med. necessity/level of care/duration of stay  If discussed at Long Length of Stay Meetings, dates discussed:    Comments:  01/31/12 13:14 Letha Cape RN, BSN [phone] patient dc to snf today.  01/29/12 14:31 Letha Cape RN, BSN [phone] patient is from [facility], CSW referral.

## 2012-01-29 NOTE — Plan of Care (Signed)
Problem: Phase I Progression Outcomes Goal: Initial discharge plan identified Outcome: Completed/Met Date Met:  01/29/12 Patient to return to snf when medically cleared.

## 2012-01-29 NOTE — Progress Notes (Addendum)
Clinical Social Work Department CLINICAL SOCIAL WORK PLACEMENT NOTE 01/29/2012  Patient:  [name], [name]  Account Number:  [id number] Admit date:  01/28/2012  Clinical Social Worker:  Jacelyn Grip  Date/time:  01/29/2012 04:45 PM  Clinical Social Work is seeking post-discharge placement for this patient at the following level of care:   SKILLED NURSING   (*CSW will update this form in Epic as items are completed)   01/29/2012  Patient/family provided with [facility] Department of Clinical Social Work's list of facilities offering this level of care within the geographic area requested by the patient (or if unable, by the patient's family).  01/29/2012  Patient/family informed of their freedom to choose among providers that offer the needed level of care, that participate in Medicare, Medicaid or managed care program needed by the patient, have an available bed and are willing to accept the patient.  01/29/2012  Patient/family informed of [facility]' ownership interest in [facility], as well as of the fact that they are under no obligation to receive care at this facility.  PASARR submitted to EDS on  PASARR number received from EDS on   FL2 transmitted to all facilities in geographic area requested by pt/family on  01/29/2012 FL2 transmitted to all facilities within larger geographic area on 01/30/2012  Patient informed that his/her managed care company has contracts with or will negotiate with  certain facilities, including the following:     Patient/family informed of bed offers received:  01/30/2012 Patient chooses bed at [facility] Physician recommends and patient chooses bed at  SNF  Patient to be transferred to  on  01/31/2012 Patient to be transferred to facility by   EMS  The following physician request were entered in Epic:   Additional Comments:   Jacklynn Lewis, MSW, [facility]  Clinical Social Work [phone]

## 2012-01-30 LAB — BASIC METABOLIC PANEL
BUN: 6 mg/dL (ref 6–23)
Creatinine, Ser: 0.58 mg/dL (ref 0.50–1.10)
GFR calc Af Amer: >90 mL/min (ref >90)
GFR calc non Af Amer: 89 mL/min — ABNORMAL LOW (ref >90)
Glucose, Bld: 89 mg/dL (ref 70–99)
Potassium: 4.2 mEq/L (ref 3.5–5.1)

## 2012-01-30 LAB — CBC
HCT: 38 % (ref 36.0–46.0)
Hemoglobin: 12.5 g/dL (ref 12.0–15.0)
MCH: 28.6 pg (ref 26.0–34.0)
MCHC: 32.9 g/dL (ref 30.0–36.0)
MCV: 87 fL (ref 78.0–100.0)
RDW: 14.2 % (ref 11.5–15.5)

## 2012-01-30 MED ORDER — SUCRALFATE 1 GM/10ML PO SUSP
1.0000 g | Freq: Three times a day (TID) | ORAL | Status: DC
  Administered 2012-01-30 – 2012-01-31 (×4): 1 g via ORAL
  Filled 2012-01-30 (×7): qty 10

## 2012-01-30 NOTE — Progress Notes (Signed)
Patient ID: [name], female   DOB: 23-Dec-1935, 76 y.o.   MRN: [id number] [facility] Gastroenterology Progress Note  LORELEY SCHWALL 76 y.o. 1935-12-26   Subjective: Doing well, tolerating full liquids. Biopsies showed ulcerated mucosa without malignancy. No H. Pylori.  Objective: Vital signs in last 24 hours: Filed Vitals:   01/30/12 0558  BP: 123/74  Pulse: 69  Temp: 98.2 F (36.8 C)  Resp: 18    Physical Exam: Gen: alert, no acute distress Abd: soft, NT, ND, +BS  Lab Results:  [facility] 01/30/12 0617 01/28/12 2306  NA 143 141  K 4.2 3.1*  CL 109 109  CO2 27 23  GLUCOSE 89 88  BUN 6 8  CREATININE 0.58 0.55  CALCIUM 8.7 8.0*  MG -- --  PHOS -- --    Basename 01/28/12 0536  AST 14  ALT 9  ALKPHOS 61  BILITOT 0.3  PROT 5.5*  ALBUMIN 2.8*    Basename 01/30/12 0617 01/28/12 2306 01/28/12 0536  WBC 4.8 6.1 --  NEUTROABS -- -- 4.9  HGB 12.5 11.8* --  HCT 38.0 35.7* --  MCV 87.0 87.9 --  PLT 205 207 --    Basename 01/28/12 0830  LABPROT 13.6  INR 1.02      Assessment/Plan: 76yo s/p hematemesis that has resolved and was found on EGD to have a large distal esophageal ulcer. Biopsies were benign. Will add Carafate and continue IV PPI until on solid food. Advance to soft food for dinner and advance further as tolerated. As outpt needs PPI PO BID and Carafate QID until seen by me in f/u. May need an EUS vs. Repeat EGD in 3-4 weeks to reevaluate esophageal ulcer and repeat biopsies, will do sooner if GI bleeding recurs. Patient in good spirits now since I told her the results of the biopsies. Please have her f/u with me in 3-4 weeks. Will sign off. Call us back if questions.   Samnang Shugars C. 01/30/2012, 12:26 PM

## 2012-01-30 NOTE — Progress Notes (Signed)
Clinical Social Worker continuing to follow for disposition planning. Clinical Social Worker spoke to pt at bedside and spoke to pt cousin, Vernona Rieger via telephone. Clinical Social Worker provided SNF bed offers. Pt cousin visited some other facilities and pt and pt cousin preference would be for pt to transfer to [facility] if availability. Clinical Social Worker spoke to [facility] and they do not have any availability at this time. Clinical Child psychotherapist discussed with pt and pt cousin and they would like pt to return to [facility] if [facility] has no availability. Clinical Social Worker notified [facility] and they can accept pt back on 7/3. Clinical Social Worker to facilitate pt discharge needs.  Jacklynn Lewis, MSW, LCSWA  Clinical Social Work [phone]

## 2012-01-30 NOTE — Progress Notes (Addendum)
Subjective:   No further episodes of vomiting. Mild headache and nausea. Had a normal BM today.  Objective  Vital signs in last 24 hours: Filed Vitals:   01/29/12 1415 01/29/12 2344 01/30/12 0558 01/30/12 1300  BP: 121/72 111/69 123/74 116/74  Pulse: 81 72 69 86  Temp: 98.1 F (36.7 C) 98.1 F (36.7 C) 98.2 F (36.8 C) 98.7 F (37.1 C)  TempSrc: Oral Oral Oral Oral  Resp: 18 16 18 18   Height:      Weight:      SpO2: 92% 92% 93% 92%   Weight change:   Intake/Output Summary (Last 24 hours) at 01/30/12 1802 Last data filed at 01/30/12 0957  Gross per 24 hour  Intake    360 ml  Output      0 ml  Net    360 ml    Physical Exam:  General Exam: Comfortable.  Respiratory System: Clear. No increased work of breathing.  Cardiovascular System: First and second heart sounds heard. Regular rate and rhythm. No JVD/murmurs.  Gastrointestinal System: Abdomen is non distended, soft and normal bowel sounds heard. Non-tender. Central Nervous System: Alert and oriented. No focal neurological deficits. Extremities: Bilateral lower extremity contractures.  Labs:  Basic Metabolic Panel:  Lab 01/30/12 1610 01/28/12 2306 01/28/12 0536  NA 143 141 140  K 4.2 3.1* 3.8  CL 109 109 107  CO2 27 23 24   GLUCOSE 89 88 104*  BUN 6 8 15   CREATININE 0.58 0.55 0.54  CALCIUM 8.7 8.0* 8.3*  ALB -- -- --  PHOS -- -- --   Liver Function Tests:  Lab 01/28/12 0536  AST 14  ALT 9  ALKPHOS 61  BILITOT 0.3  PROT 5.5*  ALBUMIN 2.8*   No results found for this basename: LIPASE:3,AMYLASE:3 in the last 168 hours No results found for this basename: AMMONIA:3 in the last 168 hours CBC:  Lab 01/30/12 0617 01/28/12 2306 01/28/12 2030 01/28/12 0536  WBC 4.8 6.1 -- 7.1  NEUTROABS -- -- -- 4.9  HGB 12.5 11.8* 12.7 --  HCT 38.0 35.7* 38.2 --  MCV 87.0 87.9 -- 87.0  PLT 205 207 -- 206   Cardiac Enzymes: No results found for this basename: CKTOTAL:5,CKMB:5,CKMBINDEX:5,TROPONINI:5 in the last 168  hours CBG: No results found for this basename: GLUCAP:5 in the last 168 hours  Iron Studies: No results found for this basename: IRON,TIBC,TRANSFERRIN,FERRITIN in the last 72 hours Studies/Results: No results found. Medications:      . citalopram  20 mg Oral Daily  . pantoprazole (PROTONIX) IV  40 mg Intravenous Q12H  . polyethylene glycol  17 g Oral QHS  . sodium chloride  3 mL Intravenous Q12H  . sucralfate  1 g Oral TID WC & HS    I  have reviewed scheduled and prn medications.     Problem/Plan: Principal Problem:  *Acute upper GI bleed Active Problems:  Hematemesis  Esophageal ulcer  Hypokalemia  Anemia  1. Acute upper GI bleed: Secondary to distal esophageal ulcer. EGD also showed nodular GEJ-biopsies were benign. No further episodes of vomiting or hematemesis. Continue IV PPI and GI is adding Carafate. Slowly advancing diet. As per GI, twice a day PPI and Carafate 4 times a day until followup with GI as outpatient. Patient may need EUS versus repeat EGD as outpatient to reevaluate esophageal ulcer. Followup with GI as outpatient in 3-4 weeks. 2. Hypokalemia: Repleted. 3. Anemia: Stable. 4. History of hyperlipidemia: Patient was taken off Lipitor by her  previous PCP Dr. Lupe Carney, secondary to myalgia. 5. History of cerebral palsy with lower extremity contractures and possible mild cognitive impairment  6. Full Code: This was confirmed with the patient in the presence of a family  Disposition: Possible discharge on 7/3, pending skilled nursing facility bed availability.   Addendum:  7 beat NSVT noted on telemetry. Asymptomatic and stable. Tele otherwise with NSR. Check Mg and 2 D Echo for LV function.  [name] 01/30/2012,6:02 PM  LOS: 2 days

## 2012-01-30 NOTE — Progress Notes (Signed)
Nutrition Brief Note:   RD pulled to pt from health history and low BMI. Pt states her weight is stable and appetite is normal. Denies drinking any nutrition supplements at the nursing facility. Denies any needs at this time.   Chart reviewed, no nutrition interventions at this time. Please consult if needed.   Clarene Duke RD, LDN Pager [phone] After Hours pager [phone]

## 2012-01-30 NOTE — Evaluation (Signed)
Physical Therapy Evaluation Patient Details Name: [name] MRN: [id number] DOB: 1936/02/19 Today's Date: 01/30/2012 Time: 4098-1191 PT Time Calculation (min): 28 min  PT Assessment / Plan / Recommendation Clinical Impression  [name] is 76 y/o female with history of cerebral palsy and bilateral LE contractures, admitted with black emesis. Presents to physical therapy with decreased mobility secondary to weakness and prolonged bed rest. Will benefit physical therapy in the acute setting to address these and the below problem list so as to decrease burden of care at the next venue. Agree with SNF for d/c plan.     PT Assessment  Patient needs continued PT services    Follow Up Recommendations  Skilled nursing facility    Barriers to Discharge        Equipment Recommendations  Defer to next venue    Recommendations for Other Services     Frequency Min 2X/week    Precautions / Restrictions Precautions Precautions: Fall Precaution Comments: baseline cerebral palsy, knee flexion contractures         Mobility  Bed Mobility Bed Mobility: Supine to Sit;Sitting - Scoot to Edge of Bed Supine to Sit: 3: Mod assist Sitting - Scoot to Edge of Bed: 3: Mod assist Details for Bed Mobility Assistance: faciliatory and verbal sequencing cues, use of pad to scoot hip EOB, heavy use of her arms to stabilize on the EOB Transfers Transfers: Sit to Stand;Stand to Sit;Stand Pivot Transfers Sit to Stand: 1: +2 Total assist;With upper extremity assist Sit to Stand: Patient Percentage: 40% Stand to Sit: 1: +2 Total assist;With upper extremity assist Stand to Sit: Patient Percentage: 40% Stand Pivot Transfers: 1: +2 Total assist Stand Pivot Transfers: Patient Percentage: 20% Details for Transfer Assistance: heavy assist to stand, pt not really extending either leg for supported standing, not able to take steps to pivot but holding onto therapist and nursing tech to support herself during  transfer Ambulation/Gait Ambulation/Gait Assistance: Not tested (comment)    Exercises     PT Diagnosis: Abnormality of gait;Difficulty walking;Generalized weakness  PT Problem List: Decreased strength;Decreased range of motion;Decreased activity tolerance;Decreased balance;Decreased mobility;Decreased coordination;Impaired tone PT Treatment Interventions: DME instruction;Functional mobility training;Therapeutic exercise;Balance training;Neuromuscular re-education;Patient/family education;Wheelchair mobility training;Therapeutic activities   PT Goals Acute Rehab PT Goals PT Goal Formulation: With patient Time For Goal Achievement: 02/13/12 Potential to Achieve Goals: Fair Pt will Roll Supine to Right Side: with supervision PT Goal: Rolling Supine to Right Side - Progress: Goal set today Pt will Roll Supine to Left Side: with supervision PT Goal: Rolling Supine to Left Side - Progress: Goal set today Pt will go Supine/Side to Sit: with min assist PT Goal: Supine/Side to Sit - Progress: Goal set today Pt will Sit at Edge of Bed: with modified independence;3-5 min PT Goal: Sit at Edge Of Bed - Progress: Goal set today Pt will go Sit to Supine/Side: with min assist PT Goal: Sit to Supine/Side - Progress: Goal set today Pt will go Sit to Stand: with mod assist PT Goal: Sit to Stand - Progress: Goal set today Pt will go Stand to Sit: with mod assist PT Goal: Stand to Sit - Progress: Goal set today Pt will Transfer Bed to Chair/Chair to Bed: with mod assist PT Transfer Goal: Bed to Chair/Chair to Bed - Progress: Goal set today Pt will Perform Home Exercise Program: with supervision, verbal cues required/provided PT Goal: Perform Home Exercise Program - Progress: Goal set today  Visit Information  Last PT Received On: 01/30/12 Assistance  Needed: +2    Subjective Data  Subjective: I tried calling but noone came so I believe I wet the bed.    Prior Functioning  Home Living Lives  With: Alone Available Help at Discharge: Skilled Nursing Facility Home Layout: One level Home Adaptive Equipment: Walker - standard Additional Comments: reports she was walking 100 ft with the therapist at [facility] Prior Function Level of Independence: Needs assistance Vocation: On disability Communication Communication: No difficulties    Cognition  Overall Cognitive Status: History of cognitive impairments - at baseline Arousal/Alertness: Awake/alert Orientation Level: Appears intact for tasks assessed Behavior During Session: [facility] for tasks performed Cognition - Other Comments: baseline cerebral palsy, somewhat slowed mentation but functional and good safety awareness, afraid of falls    Extremity/Trunk Assessment Right Upper Extremity Assessment RUE ROM/Strength/Tone: WFL for tasks assessed RUE Sensation: WFL - Light Touch;WFL - Proprioception RUE Coordination: WFL - gross/fine motor Left Upper Extremity Assessment LUE ROM/Strength/Tone: WFL for tasks assessed LUE Sensation: WFL - Light Touch;WFL - Proprioception LUE Coordination: WFL - gross/fine motor Right Lower Extremity Assessment RLE ROM/Strength/Tone: Deficits RLE ROM/Strength/Tone Deficits: knee flexion contracture, holds legs/trunk rotated to the right, legs adducted (history of CP), grossly 2/5 RLE Sensation: WFL - Light Touch RLE Coordination: Deficits RLE Coordination Deficits: hypertonal bilateral LE Left Lower Extremity Assessment LLE ROM/Strength/Tone: Deficits LLE ROM/Strength/Tone Deficits: knee flexion contracture, holds legs/trunk rotated to the right, legs adducted (history of CP), grossly 2/5 LLE Sensation: WFL - Light Touch LLE Coordination: Deficits LLE Coordination Deficits: hypertonal bil. LE Trunk Assessment Trunk Assessment: Kyphotic (decreased lordosis)   Balance Static Sitting Balance Static Sitting - Balance Support: Bilateral upper extremity supported Static Sitting - Level of  Assistance: 5: Stand by assistance Static Sitting - Comment/# of Minutes: significant posterior pelvic tilt with decreased lordosis needing heavy upper extremity assist to support herself EOB, decreased trunk control, increased kyphosis but able to keep herself on the EOB with upper extremity assist (diplegic CP?)   End of Session PT - End of Session Equipment Utilized During Treatment: Gait belt Activity Tolerance: Patient tolerated treatment well Patient left: in chair;with call bell/phone within reach Nurse Communication: Mobility status (nursing tech present during transfer)  GP     [facility] HELEN 01/30/2012, 2:30 PM

## 2012-01-31 DIAGNOSIS — D62 Acute posthemorrhagic anemia: Secondary | ICD-10-CM | POA: Diagnosis not present

## 2012-01-31 DIAGNOSIS — K5900 Constipation, unspecified: Secondary | ICD-10-CM | POA: Diagnosis not present

## 2012-01-31 DIAGNOSIS — G809 Cerebral palsy, unspecified: Secondary | ICD-10-CM | POA: Diagnosis not present

## 2012-01-31 DIAGNOSIS — D649 Anemia, unspecified: Secondary | ICD-10-CM | POA: Diagnosis not present

## 2012-01-31 DIAGNOSIS — K2211 Ulcer of esophagus with bleeding: Secondary | ICD-10-CM | POA: Diagnosis not present

## 2012-01-31 DIAGNOSIS — R58 Hemorrhage, not elsewhere classified: Secondary | ICD-10-CM | POA: Diagnosis not present

## 2012-01-31 DIAGNOSIS — F411 Generalized anxiety disorder: Secondary | ICD-10-CM | POA: Diagnosis not present

## 2012-01-31 DIAGNOSIS — R5381 Other malaise: Secondary | ICD-10-CM | POA: Diagnosis not present

## 2012-01-31 DIAGNOSIS — E876 Hypokalemia: Secondary | ICD-10-CM | POA: Diagnosis not present

## 2012-01-31 DIAGNOSIS — K922 Gastrointestinal hemorrhage, unspecified: Secondary | ICD-10-CM | POA: Diagnosis not present

## 2012-01-31 DIAGNOSIS — K2210 Ulcer of esophagus without bleeding: Secondary | ICD-10-CM | POA: Diagnosis not present

## 2012-01-31 MED ORDER — SUCRALFATE 1 GM/10ML PO SUSP: 1.0000 g | Freq: Three times a day (TID) | ORAL | Status: DC

## 2012-01-31 MED ORDER — PANTOPRAZOLE SODIUM 40 MG PO TBEC: 40.0000 mg | DELAYED_RELEASE_TABLET | Freq: Two times a day (BID) | ORAL | Status: DC

## 2012-01-31 NOTE — Progress Notes (Signed)
Clinical Social Work:  Spoke with patient's daughter Vernona Rieger with regards to discharge today.  Made contact with [facility] however there are no beds available today.  Family has placed patient on waiting list.  Patient to return back to [facility] today, via EMS.  All are agreeable.  Will complete dc paperwork and packet.  Will arrange transport.  No other needs, will sign off.  Hannah Nail, LCSW  For [employer]

## 2012-01-31 NOTE — Discharge Summary (Signed)
PATIENT DETAILS Name: [name] Age: 76 y.o. Sex: female Date of Birth: 13-Jan-1936 MRN: [id number]. Admit Date: 01/28/2012 Admitting Physician: Elease Etienne, MD [id number], [facility], MD  PRIMARY DISCHARGE DIAGNOSIS:  Principal Problem:  *Acute upper GI bleed Active Problems:  Hematemesis  Esophageal ulcer  Hypokalemia  Anemia      PAST MEDICAL HISTORY: Past Medical History  Diagnosis Date  . Constipation   . Cerebral palsy   . Hyperlipidemia     DISCHARGE MEDICATIONS: Medication List  As of 01/31/2012 10:10 AM   TAKE these medications         butalbital-acetaminophen-caffeine 50-325-40 MG per tablet   Commonly known as: FIORICET, ESGIC   Take 1 tablet by mouth 2 (two) times daily as needed. headache      carboxymethylcellulose 0.5 % Soln   Commonly known as: REFRESH PLUS   Place 1 drop into both eyes 3 (three) times daily as needed.      citalopram 20 MG tablet   Commonly known as: CELEXA   Take 20 mg by mouth daily.      pantoprazole 40 MG tablet   Commonly known as: PROTONIX   Take 1 tablet (40 mg total) by mouth 2 (two) times daily.      polyethylene glycol packet   Commonly known as: MIRALAX / GLYCOLAX   Take 17 g by mouth at bedtime.      promethazine 25 MG suppository   Commonly known as: PHENERGAN   Place 25 mg rectally every 12 (twelve) hours as needed. nausea      sucralfate 1 GM/10ML suspension   Commonly known as: CARAFATE   Take 10 mLs (1 g total) by mouth 4 (four) times daily -  with meals and at bedtime.             BRIEF HPI:  See H&P, Labs, Consult and Test reports for all details in brief, patient is a 76 year old resident of a skilled nursing facility with a past medical history of cerebral palsy and lower extremity contractures was sent to the hospital on 01/28/2012 4 hematemesis. There is no history of melena. Patient was then admitted to the hospitalist service for further evaluation and treatment.  CONSULTATIONS:   GI-Dr  Charlott Rakes  PERTINENT RADIOLOGIC STUDIES: No results found.   PERTINENT LAB RESULTS: CBC:  Basename 01/30/12 0617 01/28/12 2306  WBC 4.8 6.1  HGB 12.5 11.8*  HCT 38.0 35.7*  PLT 205 207   CMET CMP     Component Value Date/Time   NA 143 01/30/2012 0617   K 4.2 01/30/2012 0617   CL 109 01/30/2012 0617   CO2 27 01/30/2012 0617   GLUCOSE 89 01/30/2012 0617   BUN 6 01/30/2012 0617   CREATININE 0.58 01/30/2012 0617   CALCIUM 8.7 01/30/2012 0617   PROT 5.5* 01/28/2012 0536   ALBUMIN 2.8* 01/28/2012 0536   AST 14 01/28/2012 0536   ALT 9 01/28/2012 0536   ALKPHOS 61 01/28/2012 0536   BILITOT 0.3 01/28/2012 0536   GFRNONAA 89* 01/30/2012 0617   GFRAA >90 01/30/2012 0617    GFR Estimated Creatinine Clearance: 51.7 ml/min (by C-G formula based on Cr of 0.58). No results found for this basename: LIPASE:2,AMYLASE:2 in the last 72 hours No results found for this basename: CKTOTAL:3,CKMB:3,CKMBINDEX:3,TROPONINI:3 in the last 72 hours No components found with this basename: POCBNP:3 No results found for this basename: DDIMER:2 in the last 72 hours No results found for this basename: HGBA1C:2 in the last 72  hours No results found for this basename: CHOL:2,HDL:2,LDLCALC:2,TRIG:2,CHOLHDL:2,LDLDIRECT:2 in the last 72 hours No results found for this basename: TSH,T4TOTAL,FREET3,T3FREE,THYROIDAB in the last 72 hours No results found for this basename: VITAMINB12:2,FOLATE:2,FERRITIN:2,TIBC:2,IRON:2,RETICCTPCT:2 in the last 72 hours Coags: No results found for this basename: PT:2,INR:2 in the last 72 hours Microbiology: No results found for this or any previous visit (from the past 240 hour(s)).   BRIEF HOSPITAL COURSE:   Principal Problem:  *Acute upper GI bleed -This was secondary to a distal esophagitis ulcer -EGD was done this admission-biopsies were benign-there was no evidence of H. pylori. -Diet was slowly advanced to a dysphagia 3 diet -Await inpatient she was maintained on IV Protonix twice  daily and sucralfate, Dr. Bosie Clos recommends keeping the patient on twice a day dosing of PPI and 4 times a day dosing of Carafate and he sees her in his office -She probably will require a second look endoscopy anemia future to assess/document ulcer healing-we will defer this to gastroenterology  Anemia -Minimal anemia secondary to blood loss -She was not transfused PRBC this admission  Hypokalemia  -This is secondary to vomiting/GI loss  -This was repeated   Rest of the patient's medical issues were stable  TODAY-DAY OF DISCHARGE:  Subjective:   Aquilla Hacker today has no headache,no chest abdominal pain,no new weakness tingling or numbness, feels much better  and is tolerating a dysphagia 3 diet with no issues. There has been no further hematemesis  Objective:   Blood pressure 113/72, pulse 70, temperature 98.2 F (36.8 C), temperature source Oral, resp. rate 20, height 5\' 6"  (1.676 m), weight 53.071 kg (117 lb), SpO2 93.00%.  Intake/Output Summary (Last 24 hours) at 01/31/12 1010 Last data filed at 01/30/12 1854  Gross per 24 hour  Intake    240 ml  Output      0 ml  Net    240 ml    Exam Awake Alert, Oriented *3, No new F.N deficits, Normal affect [place].AT,PERRAL Supple Neck,No JVD, No cervical lymphadenopathy appriciated.  Symmetrical Chest wall movement, Good air movement bilaterally, CTAB RRR,No Gallops,Rubs or new Murmurs, No Parasternal Heave +ve B.Sounds, Abd Soft, Non tender, No organomegaly appriciated, No rebound -guarding or rigidity. No Cyanosis, Clubbing or edema, No new Rash or bruise  DISPOSITION:  Back to skilled nursing facility   DISCHARGE INSTRUCTIONS:    -Diet: Dysphagia 3 diet  Follow-up Information    Schedule an appointment as soon as possible for a visit with Oneal Grout, MD.   Contact information:   [address] [facility] [place] [address] [phone]       Follow up with Shirley Friar., MD. Schedule an  appointment as soon as possible for a visit in 2 weeks.   Contact information:   [address] [employer], [place]. [place] [address] [phone]          Total Time spent on discharge equals 45 minutes.  SignedJeoffrey Massed 01/31/2012 10:10 AM

## 2012-01-31 NOTE — Progress Notes (Addendum)
Pt discharged to floor ,report given to RN at [facility].

## 2012-02-05 DIAGNOSIS — G809 Cerebral palsy, unspecified: Secondary | ICD-10-CM | POA: Diagnosis not present

## 2012-02-05 DIAGNOSIS — F411 Generalized anxiety disorder: Secondary | ICD-10-CM | POA: Diagnosis not present

## 2012-02-05 DIAGNOSIS — K5900 Constipation, unspecified: Secondary | ICD-10-CM | POA: Diagnosis not present

## 2012-02-05 DIAGNOSIS — K922 Gastrointestinal hemorrhage, unspecified: Secondary | ICD-10-CM | POA: Diagnosis not present

## 2012-02-13 DIAGNOSIS — E785 Hyperlipidemia, unspecified: Secondary | ICD-10-CM | POA: Diagnosis not present

## 2012-02-13 DIAGNOSIS — E875 Hyperkalemia: Secondary | ICD-10-CM | POA: Diagnosis not present

## 2012-02-13 DIAGNOSIS — D649 Anemia, unspecified: Secondary | ICD-10-CM | POA: Diagnosis not present

## 2012-02-20 DIAGNOSIS — K922 Gastrointestinal hemorrhage, unspecified: Secondary | ICD-10-CM | POA: Diagnosis not present

## 2012-02-20 DIAGNOSIS — E876 Hypokalemia: Secondary | ICD-10-CM | POA: Diagnosis not present

## 2012-02-20 DIAGNOSIS — G44209 Tension-type headache, unspecified, not intractable: Secondary | ICD-10-CM | POA: Diagnosis not present

## 2012-02-20 DIAGNOSIS — G809 Cerebral palsy, unspecified: Secondary | ICD-10-CM | POA: Diagnosis not present

## 2012-02-20 DIAGNOSIS — H04129 Dry eye syndrome of unspecified lacrimal gland: Secondary | ICD-10-CM | POA: Diagnosis not present

## 2012-03-03 DIAGNOSIS — R05 Cough: Secondary | ICD-10-CM | POA: Diagnosis not present

## 2012-03-21 DIAGNOSIS — R197 Diarrhea, unspecified: Secondary | ICD-10-CM | POA: Diagnosis not present

## 2012-03-22 DIAGNOSIS — N390 Urinary tract infection, site not specified: Secondary | ICD-10-CM | POA: Diagnosis not present

## 2012-03-22 DIAGNOSIS — D649 Anemia, unspecified: Secondary | ICD-10-CM | POA: Diagnosis not present

## 2012-03-22 DIAGNOSIS — R197 Diarrhea, unspecified: Secondary | ICD-10-CM | POA: Diagnosis not present

## 2012-03-23 DIAGNOSIS — I509 Heart failure, unspecified: Secondary | ICD-10-CM | POA: Diagnosis not present

## 2012-03-23 DIAGNOSIS — E876 Hypokalemia: Secondary | ICD-10-CM | POA: Diagnosis not present

## 2012-03-25 ENCOUNTER — Encounter (HOSPITAL_COMMUNITY): Payer: Self-pay | Admitting: Internal Medicine

## 2012-03-25 ENCOUNTER — Inpatient Hospital Stay (HOSPITAL_COMMUNITY)
Admission: EM | Admit: 2012-03-25 | Discharge: 2012-03-30 | DRG: 372 | Disposition: A | Discharge status: Skilled Nursing Facility | Payer: Medicare Other | Attending: Internal Medicine | Admitting: Internal Medicine

## 2012-03-25 DIAGNOSIS — R279 Unspecified lack of coordination: Secondary | ICD-10-CM | POA: Diagnosis not present

## 2012-03-25 DIAGNOSIS — R197 Diarrhea, unspecified: Secondary | ICD-10-CM | POA: Diagnosis not present

## 2012-03-25 DIAGNOSIS — D649 Anemia, unspecified: Secondary | ICD-10-CM | POA: Diagnosis not present

## 2012-03-25 DIAGNOSIS — R262 Difficulty in walking, not elsewhere classified: Secondary | ICD-10-CM | POA: Diagnosis not present

## 2012-03-25 DIAGNOSIS — A0472 Enterocolitis due to Clostridium difficile, not specified as recurrent: Secondary | ICD-10-CM | POA: Diagnosis not present

## 2012-03-25 DIAGNOSIS — K529 Noninfective gastroenteritis and colitis, unspecified: Secondary | ICD-10-CM

## 2012-03-25 DIAGNOSIS — K2210 Ulcer of esophagus without bleeding: Secondary | ICD-10-CM | POA: Diagnosis not present

## 2012-03-25 DIAGNOSIS — K6389 Other specified diseases of intestine: Secondary | ICD-10-CM | POA: Diagnosis not present

## 2012-03-25 DIAGNOSIS — G809 Cerebral palsy, unspecified: Secondary | ICD-10-CM | POA: Diagnosis present

## 2012-03-25 DIAGNOSIS — Z8719 Personal history of other diseases of the digestive system: Secondary | ICD-10-CM

## 2012-03-25 DIAGNOSIS — E876 Hypokalemia: Secondary | ICD-10-CM | POA: Diagnosis not present

## 2012-03-25 DIAGNOSIS — K5289 Other specified noninfective gastroenteritis and colitis: Secondary | ICD-10-CM | POA: Diagnosis not present

## 2012-03-25 DIAGNOSIS — N390 Urinary tract infection, site not specified: Secondary | ICD-10-CM | POA: Diagnosis present

## 2012-03-25 DIAGNOSIS — K922 Gastrointestinal hemorrhage, unspecified: Secondary | ICD-10-CM

## 2012-03-25 DIAGNOSIS — K920 Hematemesis: Secondary | ICD-10-CM

## 2012-03-25 DIAGNOSIS — E785 Hyperlipidemia, unspecified: Secondary | ICD-10-CM | POA: Diagnosis not present

## 2012-03-25 DIAGNOSIS — R112 Nausea with vomiting, unspecified: Secondary | ICD-10-CM | POA: Diagnosis not present

## 2012-03-25 DIAGNOSIS — M6281 Muscle weakness (generalized): Secondary | ICD-10-CM | POA: Diagnosis not present

## 2012-03-25 DIAGNOSIS — K8689 Other specified diseases of pancreas: Secondary | ICD-10-CM | POA: Diagnosis not present

## 2012-03-25 DIAGNOSIS — M62838 Other muscle spasm: Secondary | ICD-10-CM | POA: Diagnosis not present

## 2012-03-25 HISTORY — DX: Urinary tract infection, site not specified: N39.0

## 2012-03-25 LAB — URINALYSIS, ROUTINE W REFLEX MICROSCOPIC
Bilirubin Urine: NEGATIVE
Glucose, UA: NEGATIVE mg/dL
Protein, ur: NEGATIVE mg/dL
Urobilinogen, UA: 0.2 mg/dL (ref 0.0–1.0)

## 2012-03-25 LAB — CBC WITH DIFFERENTIAL/PLATELET
Basophils Relative: 1 % (ref 0–1)
Eosinophils Absolute: 0.2 10*3/uL (ref 0.0–0.7)
HCT: 34.7 % — ABNORMAL LOW (ref 36.0–46.0)
Hemoglobin: 11.8 g/dL — ABNORMAL LOW (ref 12.0–15.0)
Lymphs Abs: 1.6 10*3/uL (ref 0.7–4.0)
MCH: 28.5 pg (ref 26.0–34.0)
MCHC: 34 g/dL (ref 30.0–36.0)
MCV: 83.8 fL (ref 78.0–100.0)
Monocytes Absolute: 1.5 10*3/uL — ABNORMAL HIGH (ref 0.1–1.0)
Neutro Abs: 5.3 10*3/uL (ref 1.7–7.7)
Neutrophils Relative %: 62 % (ref 43–77)

## 2012-03-25 LAB — URINE MICROSCOPIC-ADD ON

## 2012-03-25 LAB — BASIC METABOLIC PANEL
BUN: <3 mg/dL — ABNORMAL LOW (ref 6–23)
Calcium: 8.3 mg/dL — ABNORMAL LOW (ref 8.4–10.5)
Creatinine, Ser: 0.4 mg/dL — ABNORMAL LOW (ref 0.50–1.10)
GFR calc non Af Amer: >90 mL/min (ref >90)
Glucose, Bld: 88 mg/dL (ref 70–99)

## 2012-03-25 LAB — LIPASE, BLOOD: Lipase: 32 U/L (ref 11–59)

## 2012-03-25 LAB — HEPATIC FUNCTION PANEL
Albumin: 2.3 g/dL — ABNORMAL LOW (ref 3.5–5.2)
Total Bilirubin: 0.3 mg/dL (ref 0.3–1.2)

## 2012-03-25 MED ORDER — PANTOPRAZOLE SODIUM 40 MG PO TBEC
40.0000 mg | DELAYED_RELEASE_TABLET | Freq: Two times a day (BID) | ORAL | Status: DC
  Administered 2012-03-26 – 2012-03-27 (×4): 40 mg via ORAL
  Filled 2012-03-25 (×3): qty 1

## 2012-03-25 MED ORDER — POTASSIUM CHLORIDE CRYS ER 20 MEQ PO TBCR
40.0000 meq | EXTENDED_RELEASE_TABLET | Freq: Once | ORAL | Status: AC
  Administered 2012-03-25: 40 meq via ORAL
  Filled 2012-03-25: qty 2

## 2012-03-25 MED ORDER — CEFTRIAXONE SODIUM 1 G IJ SOLR
1.0000 g | INTRAMUSCULAR | Status: DC
  Administered 2012-03-26: 1 g via INTRAVENOUS
  Filled 2012-03-25: qty 10

## 2012-03-25 MED ORDER — SUCRALFATE 1 GM/10ML PO SUSP
1.0000 g | Freq: Three times a day (TID) | ORAL | Status: DC
  Administered 2012-03-27 – 2012-03-30 (×13): 1 g via ORAL
  Filled 2012-03-25 (×18): qty 10

## 2012-03-25 MED ORDER — POLYVINYL ALCOHOL 1.4 % OP SOLN
1.0000 [drp] | Freq: Three times a day (TID) | OPHTHALMIC | Status: DC | PRN
  Filled 2012-03-25: qty 15

## 2012-03-25 MED ORDER — DEXTROSE 5 % IV SOLN
1.0000 g | Freq: Once | INTRAVENOUS | Status: AC
  Administered 2012-03-25: 1 g via INTRAVENOUS
  Filled 2012-03-25: qty 10

## 2012-03-25 MED ORDER — KETOCONAZOLE 2 % EX SHAM
1.0000 "application " | MEDICATED_SHAMPOO | CUTANEOUS | Status: DC
  Filled 2012-03-25 (×2): qty 120

## 2012-03-25 MED ORDER — CITALOPRAM HYDROBROMIDE 20 MG PO TABS
20.0000 mg | ORAL_TABLET | Freq: Every day | ORAL | Status: DC
  Administered 2012-03-26 – 2012-03-30 (×5): 20 mg via ORAL
  Filled 2012-03-25 (×5): qty 1

## 2012-03-25 MED ORDER — TRIAMCINOLONE ACETONIDE 0.1 % EX CREA
1.0000 "application " | TOPICAL_CREAM | Freq: Two times a day (BID) | CUTANEOUS | Status: DC | PRN
  Filled 2012-03-25: qty 15

## 2012-03-25 MED ORDER — SODIUM CHLORIDE 0.9 % IV BOLUS (SEPSIS)
500.0000 mL | Freq: Once | INTRAVENOUS | Status: AC
  Administered 2012-03-25: 1000 mL via INTRAVENOUS

## 2012-03-25 MED ORDER — ACETAMINOPHEN 325 MG PO TABS: 650.0000 mg | ORAL_TABLET | Freq: Four times a day (QID) | ORAL | Status: DC | PRN

## 2012-03-25 MED ORDER — POTASSIUM CHLORIDE IN NACL 20-0.9 MEQ/L-% IV SOLN
INTRAVENOUS | Status: AC
  Administered 2012-03-26: via INTRAVENOUS
  Filled 2012-03-25 (×2): qty 1000

## 2012-03-25 MED ORDER — BUTALBITAL-APAP-CAFFEINE 50-325-40 MG PO TABS: 1.0000 | ORAL_TABLET | Freq: Two times a day (BID) | ORAL | Status: DC | PRN

## 2012-03-25 MED ORDER — ACETAMINOPHEN 650 MG RE SUPP: 650.0000 mg | Freq: Four times a day (QID) | RECTAL | Status: DC | PRN

## 2012-03-25 MED ORDER — SODIUM CHLORIDE 0.9 % IV SOLN: Freq: Once | INTRAVENOUS | Status: DC

## 2012-03-25 MED ORDER — SODIUM CHLORIDE 0.9 % IV BOLUS (SEPSIS): 500.0000 mL | Freq: Once | INTRAVENOUS | Status: DC

## 2012-03-25 MED ORDER — ONDANSETRON HCL 4 MG/2ML IJ SOLN
4.0000 mg | Freq: Four times a day (QID) | INTRAMUSCULAR | Status: DC | PRN
  Administered 2012-03-27: 4 mg via INTRAVENOUS
  Filled 2012-03-25: qty 2

## 2012-03-25 MED ORDER — PROMETHAZINE HCL 25 MG RE SUPP: 25.0000 mg | Freq: Two times a day (BID) | RECTAL | Status: DC | PRN

## 2012-03-25 MED ORDER — POTASSIUM CHLORIDE ER 10 MEQ PO TBCR
20.0000 meq | EXTENDED_RELEASE_TABLET | ORAL | Status: DC
  Filled 2012-03-25: qty 2

## 2012-03-25 MED ORDER — ONDANSETRON HCL 4 MG PO TABS: 4.0000 mg | ORAL_TABLET | Freq: Four times a day (QID) | ORAL | Status: DC | PRN

## 2012-03-25 NOTE — ED Notes (Signed)
Family requesting to speak with registration representative.  Secretary asked to call and ensure registration completed.  Family informed of efforts.

## 2012-03-25 NOTE — H&P (Signed)
[name] is an 75 y.o. female.   Patient was seen and examined on March 25, 2012 at 8:15 PM. PCP - Dr. Oneal Grout. Chief Complaint: Diarrhea. HPI: 76 year-old female with history of cerebral palsy with lower extremity contractures who was recently admitted for upper GI bleed secondary to esophageal ulcer has been experiencing diarrhea over the last one week. Initially the diarrhea was associated nausea and vomiting. Denies any abdominal pain though did have some subjective feeling of fever and chills. Denies any blood in the diarrhea. At the nursing home patient had stool for C. difficile which was negative. Patient was found to have leukocytosis. UA examined at the nursing home was negative. Since patient has persistent diarrhea patient was transferred to ER for further workup. In the ER workup shows patient has UTI. Patient is hemodynamic stable. Patient will be admitted for further observation and management.  Past Medical History  Diagnosis Date  . Constipation   . Cerebral palsy   . Hyperlipidemia     Past Surgical History  Procedure Date  . Right hip surgery   . Tonsillectomy   . Esophagogastroduodenoscopy 01/28/2012    Procedure: ESOPHAGOGASTRODUODENOSCOPY (EGD);  Surgeon: Shirley Friar, MD;  Location: [facility] ENDOSCOPY;  Service: Endoscopy;  Laterality: N/A;    History reviewed. No pertinent family history. Social History:  reports that she has never smoked. She does not have any smokeless tobacco history on file. She reports that she does not drink alcohol or use illicit drugs.  Allergies: No Known Allergies   (Not in a hospital admission)  Results for orders placed during the hospital encounter of 03/25/12 (from the past 48 hour(s))  CBC WITH DIFFERENTIAL     Status: Abnormal   Collection Time   03/25/12  5:16 PM      Component Value Range Comment   WBC 8.7  4.0 - 10.5 K/uL    RBC 4.14  3.87 - 5.11 MIL/uL    Hemoglobin 11.8 (*) 12.0 - 15.0 g/dL    HCT 96.0 (*)  45.4 - 46.0 %    MCV 83.8  78.0 - 100.0 fL    MCH 28.5  26.0 - 34.0 pg    MCHC 34.0  30.0 - 36.0 g/dL    RDW 09.8  11.9 - 14.7 %    Platelets 300  150 - 400 K/uL    Neutrophils Relative 62  43 - 77 %    Lymphocytes Relative 18  12 - 46 %    Monocytes Relative 17 (*) 3 - 12 %    Eosinophils Relative 2  0 - 5 %    Basophils Relative 1  0 - 1 %    Neutro Abs 5.3  1.7 - 7.7 K/uL    Lymphs Abs 1.6  0.7 - 4.0 K/uL    Monocytes Absolute 1.5 (*) 0.1 - 1.0 K/uL    Eosinophils Absolute 0.2  0.0 - 0.7 K/uL    Basophils Absolute 0.1  0.0 - 0.1 K/uL    Smear Review MORPHOLOGY UNREMARKABLE     BASIC METABOLIC PANEL     Status: Abnormal   Collection Time   03/25/12  5:16 PM      Component Value Range Comment   Sodium 138  135 - 145 mEq/L    Potassium 3.2 (*) 3.5 - 5.1 mEq/L    Chloride 105  96 - 112 mEq/L    CO2 24  19 - 32 mEq/L    Glucose, Bld 88  70 - 99 mg/dL    BUN <3 (*) 6 - 23 mg/dL    Creatinine, Ser 6.21 (*) 0.50 - 1.10 mg/dL    Calcium 8.3 (*) 8.4 - 10.5 mg/dL    GFR calc non Af Amer >90  >90 mL/min    GFR calc Af Amer >90  >90 mL/min   URINALYSIS, ROUTINE W REFLEX MICROSCOPIC     Status: Abnormal   Collection Time   03/25/12  6:14 PM      Component Value Range Comment   Color, Urine YELLOW  YELLOW    APPearance CLOUDY (*) CLEAR    Specific Gravity, Urine 1.011  1.005 - 1.030    pH 7.0  5.0 - 8.0    Glucose, UA NEGATIVE  NEGATIVE mg/dL    Hgb urine dipstick MODERATE (*) NEGATIVE    Bilirubin Urine NEGATIVE  NEGATIVE    Ketones, ur 40 (*) NEGATIVE mg/dL    Protein, ur NEGATIVE  NEGATIVE mg/dL    Urobilinogen, UA 0.2  0.0 - 1.0 mg/dL    Nitrite NEGATIVE  NEGATIVE    Leukocytes, UA LARGE (*) NEGATIVE   URINE MICROSCOPIC-ADD ON     Status: Abnormal   Collection Time   03/25/12  6:14 PM      Component Value Range Comment   WBC, UA TOO NUMEROUS TO COUNT  <3 WBC/hpf    RBC / HPF 0-2  <3 RBC/hpf    Bacteria, UA FEW (*) RARE    Urine-Other MUCOUS PRESENT      No results  found.  Review of Systems  Constitutional: Positive for fever.  HENT: Negative.   Eyes: Negative.   Respiratory: Negative.   Cardiovascular: Negative.   Gastrointestinal: Positive for nausea, vomiting and diarrhea.  Genitourinary: Negative.   Musculoskeletal: Negative.   Skin: Negative.   Neurological: Negative.   Endo/Heme/Allergies: Negative.   Psychiatric/Behavioral: Negative.     Blood pressure 113/65, pulse 84, temperature 99.5 F (37.5 C), temperature source Oral, resp. rate 21, SpO2 93.00%. Physical Exam  Constitutional: She is oriented to person, place, and time. She appears well-developed and well-nourished. No distress.  HENT:  Head: Normocephalic and atraumatic.  Right Ear: External ear normal.  Left Ear: External ear normal.  Nose: Nose normal.  Mouth/Throat: Oropharynx is clear and moist. No oropharyngeal exudate.  Eyes: Conjunctivae are normal. Pupils are equal, round, and reactive to light. Right eye exhibits no discharge. Left eye exhibits no discharge. No scleral icterus.  Neck: Normal range of motion. Neck supple.  Cardiovascular: Normal rate and regular rhythm.   Respiratory: Effort normal and breath sounds normal. No respiratory distress. She has no wheezes. She has no rales.  GI: Soft. Bowel sounds are normal. She exhibits no distension. There is no tenderness. There is no rebound and no guarding.  Musculoskeletal:       Contractures both lower extremities.  Neurological: She is alert and oriented to person, place, and time.  Skin: Skin is warm and dry. She is not diaphoretic.  Psychiatric: Her behavior is normal.     Assessment/Plan #1. Diarrhea - most likely viral gastroenteritis. Check for stool culture and C. difficile. Gently hydrate. Since the symptoms have been ongoing for last one week we will check LFTs lipase and CT abdomen pelvis. #2. UTI - patient has been started on ceftriaxone which will be continued. Check urine culture. #3. Recent  admission for GI bleed secondary to esophageal ulcer - continue Protonix and Carafate. Monitor CBC. #4. Cerebral palsy with  lower extremity contractures.  CODE STATUS - full code.   Kris No N. 03/25/2012, 8:30 PM

## 2012-03-25 NOTE — ED Notes (Signed)
Patient provided with clear liquids and more warm blankets per request.  Toilet offered at this time but patient declined.

## 2012-03-25 NOTE — ED Provider Notes (Signed)
History     CSN: [id number]  Arrival date & time 03/25/12  1327   First MD Initiated Contact with Patient 03/25/12 1616      Chief Complaint  Patient presents with  . Diarrhea    (Consider location/radiation/quality/duration/timing/severity/associated sxs/prior treatment) HPI Comments: [name] 76 y.o. female   The chief complaint is: Patient presents with:   Diarrhea   The patient has medical history significant for:   Past Medical History:   Constipation                                                 Cerebral palsy                                               Hyperlipidemia                                              Patient presents with NVD that began Tuesday night. Patient has history of PUD, diagnosed 6.29.13 and states she has been adherent to her medications, but has pain on eating. Patient states that Saturday she had a fever of 101, with associated chills. Reports dysuria and frequency. Denies sweats. Denies CP, palpitations, SOB. Denies hematemesis. Denies abdominal surgeries. Patient relative was told by nursing staff at "[facility]" that a "GI bug" has been going around.       The history is provided by the patient.    Past Medical History  Diagnosis Date  . Constipation   . Cerebral palsy   . Hyperlipidemia     Past Surgical History  Procedure Date  . Right hip surgery   . Tonsillectomy   . Esophagogastroduodenoscopy 01/28/2012    Procedure: ESOPHAGOGASTRODUODENOSCOPY (EGD);  Surgeon: Shirley Friar, MD;  [facility];  Service: Endoscopy;  Laterality: N/A;    No family history on file.  History  Substance Use Topics  . Smoking status: Never Smoker   . Smokeless tobacco: Not on file  . Alcohol Use: No    OB History    Grav Para Term Preterm Abortions TAB SAB Ect Mult Living                  Review of Systems  Constitutional: Positive for chills. Negative for fever and diaphoresis.  Respiratory: Negative for  shortness of breath.   Cardiovascular: Negative for chest pain and palpitations.  Gastrointestinal: Positive for nausea, vomiting and diarrhea. Negative for blood in stool.  Genitourinary: Positive for dysuria and frequency.  All other systems reviewed and are negative.    Allergies  Review of patient's allergies indicates no known allergies.  Home Medications   Current Outpatient Rx  Name Route Sig Dispense Refill  . BUTALBITAL-APAP-CAFFEINE 50-325-40 MG PO TABS Oral Take 1 tablet by mouth 2 (two) times daily as needed. headache    . CARBOXYMETHYLCELLULOSE SODIUM 0.5 % OP SOLN Both Eyes Place 1 drop into both eyes 3 (three) times daily as needed.    Marland Kitchen CITALOPRAM HYDROBROMIDE 20 MG PO TABS Oral Take 20 mg by mouth daily.    Marland Kitchen KETOCONAZOLE 2 %  EX SHAM Topical Apply 1 application topically once a week. For dry itchy scalp    . PANTOPRAZOLE SODIUM 40 MG PO TBEC Oral Take 1 tablet (40 mg total) by mouth 2 (two) times daily.    Marland Kitchen POLYETHYLENE GLYCOL 3350 PO PACK Oral Take 17 g by mouth at bedtime.    Marland Kitchen POTASSIUM CHLORIDE ER 10 MEQ PO TBCR Oral Take 20 mEq by mouth 3 (three) times a week. Sat, sun, mon    . PROMETHAZINE HCL 25 MG RE SUPP Rectal Place 25 mg rectally every 12 (twelve) hours as needed. nausea    . SUCRALFATE 1 GM/10ML PO SUSP Oral Take 1 g by mouth 4 (four) times daily -  with meals and at bedtime.    . TRIAMCINOLONE ACETONIDE 0.1 % EX CREA Topical Apply 1 application topically 2 (two) times daily as needed. For dermatitis areas      BP 120/75  Pulse 85  Temp 99.5 F (37.5 C) (Oral)  Resp 18  SpO2 92%  Physical Exam  Nursing note and vitals reviewed. Constitutional: She appears well-developed and well-nourished. She appears distressed.  HENT:  Head: Normocephalic and atraumatic.  Mouth/Throat: Oropharynx is clear and moist.  Neck: Normal range of motion. Neck supple.  Cardiovascular: Normal rate, regular rhythm and normal heart sounds.   Pulmonary/Chest: Effort  normal and breath sounds normal.  Abdominal: Soft. Bowel sounds are normal. There is no tenderness.  Neurological: She is alert.  Skin: Skin is warm and dry.    ED Course  Procedures (including critical care time)  Labs Reviewed  CBC WITH DIFFERENTIAL - Abnormal; Notable for the following:    Hemoglobin 11.8 (*)     HCT 34.7 (*)     Monocytes Relative 17 (*)     Monocytes Absolute 1.5 (*)     All other components within normal limits  BASIC METABOLIC PANEL - Abnormal; Notable for the following:    Potassium 3.2 (*)     BUN <3 (*)     Creatinine, Ser 0.40 (*)     Calcium 8.3 (*)     All other components within normal limits  URINALYSIS, ROUTINE W REFLEX MICROSCOPIC - Abnormal; Notable for the following:    APPearance CLOUDY (*)     Hgb urine dipstick MODERATE (*)     Ketones, ur 40 (*)     Leukocytes, UA LARGE (*)     All other components within normal limits  URINE MICROSCOPIC-ADD ON - Abnormal; Notable for the following:    Bacteria, UA FEW (*)     All other components within normal limits   No results found.   1. Diarrhea   2. Hypokalemia   3. UTI (lower urinary tract infection)       MDM  Patient presented for NVD since Tuesday. Patient is a resident of [facility]. Patient given fluids in ED. CBC: unremarkable. BMP: hypokalemia, replacement given. UA: remarkable for moderate blood, bacteria, TNTC WBCS. Patient started on Rocephin in ED. Patient will most likely be admitted per Dr. Donnetta Hutching.        Pixie Casino, PA-C 03/25/12 2132

## 2012-03-25 NOTE — ED Notes (Signed)
This RN and 2 others of the nursing staff attempted to place in and out catheter without success.  Patient is now on the bed pan.  Patient did void today while in the ED.  Communicated with MD, Adriana Simas regarding difficulty with in and out catheter.  Patient is too contracted with her CP for in and out catheter.

## 2012-03-25 NOTE — ED Notes (Signed)
Patient still awaiting initial assessment by provider.  Patient continues to request something to eat or ice chips.  Per MD, Zammit, patient okay to have ice chips.

## 2012-03-25 NOTE — ED Notes (Signed)
Labs drawn with PIV placement

## 2012-03-26 ENCOUNTER — Inpatient Hospital Stay (HOSPITAL_COMMUNITY): Payer: Medicare Other

## 2012-03-26 ENCOUNTER — Encounter (HOSPITAL_COMMUNITY): Payer: Self-pay | Admitting: Radiology

## 2012-03-26 DIAGNOSIS — E876 Hypokalemia: Secondary | ICD-10-CM

## 2012-03-26 DIAGNOSIS — K2210 Ulcer of esophagus without bleeding: Secondary | ICD-10-CM

## 2012-03-26 DIAGNOSIS — K529 Noninfective gastroenteritis and colitis, unspecified: Secondary | ICD-10-CM

## 2012-03-26 DIAGNOSIS — R197 Diarrhea, unspecified: Secondary | ICD-10-CM

## 2012-03-26 HISTORY — DX: Noninfective gastroenteritis and colitis, unspecified: K52.9

## 2012-03-26 LAB — COMPREHENSIVE METABOLIC PANEL
Alkaline Phosphatase: 102 U/L (ref 39–117)
BUN: <3 mg/dL — ABNORMAL LOW (ref 6–23)
Chloride: 108 mEq/L (ref 96–112)
Creatinine, Ser: 0.38 mg/dL — ABNORMAL LOW (ref 0.50–1.10)
GFR calc Af Amer: >90 mL/min (ref >90)
Glucose, Bld: 92 mg/dL (ref 70–99)
Potassium: 3.3 mEq/L — ABNORMAL LOW (ref 3.5–5.1)
Total Bilirubin: 0.2 mg/dL — ABNORMAL LOW (ref 0.3–1.2)

## 2012-03-26 LAB — CBC
HCT: 35 % — ABNORMAL LOW (ref 36.0–46.0)
MCH: 28.4 pg (ref 26.0–34.0)
MCV: 83.5 fL (ref 78.0–100.0)
Platelets: 316 10*3/uL (ref 150–400)
Platelets: 331 10*3/uL (ref 150–400)
RBC: 4.05 MIL/uL (ref 3.87–5.11)
RBC: 4.19 MIL/uL (ref 3.87–5.11)
RDW: 14.7 % (ref 11.5–15.5)
WBC: 7.7 10*3/uL (ref 4.0–10.5)

## 2012-03-26 MED ORDER — METRONIDAZOLE IN NACL 5-0.79 MG/ML-% IV SOLN
500.0000 mg | Freq: Three times a day (TID) | INTRAVENOUS | Status: DC
  Administered 2012-03-26 – 2012-03-27 (×4): 500 mg via INTRAVENOUS
  Filled 2012-03-26 (×7): qty 100

## 2012-03-26 MED ORDER — IOHEXOL 300 MG/ML  SOLN
100.0000 mL | Freq: Once | INTRAMUSCULAR | Status: AC | PRN
  Administered 2012-03-26: 100 mL via INTRAVENOUS

## 2012-03-26 MED ORDER — CIPROFLOXACIN IN D5W 400 MG/200ML IV SOLN
400.0000 mg | Freq: Two times a day (BID) | INTRAVENOUS | Status: DC
  Administered 2012-03-26 (×2): 400 mg via INTRAVENOUS
  Filled 2012-03-26 (×3): qty 200

## 2012-03-26 MED ORDER — MUPIROCIN 2 % EX OINT
1.0000 "application " | TOPICAL_OINTMENT | Freq: Two times a day (BID) | CUTANEOUS | Status: DC
  Administered 2012-03-26 – 2012-03-29 (×8): 1 via NASAL
  Filled 2012-03-26: qty 22

## 2012-03-26 MED ORDER — IOHEXOL 300 MG/ML  SOLN
20.0000 mL | INTRAMUSCULAR | Status: AC
  Administered 2012-03-26 (×2): 20 mL via ORAL

## 2012-03-26 MED ORDER — POTASSIUM CHLORIDE 10 MEQ/100ML IV SOLN
10.0000 meq | INTRAVENOUS | Status: AC
  Administered 2012-03-26 (×3): 10 meq via INTRAVENOUS
  Filled 2012-03-26 (×3): qty 100

## 2012-03-26 MED ORDER — CHLORHEXIDINE GLUCONATE CLOTH 2 % EX PADS
6.0000 | MEDICATED_PAD | Freq: Every day | CUTANEOUS | Status: AC
  Administered 2012-03-26 – 2012-03-30 (×5): 6 via TOPICAL

## 2012-03-26 NOTE — Clinical Documentation Improvement (Signed)
DOCUMENTATION CLARIFICATION QUERY  THIS DOCUMENT IS NOT A PERMANENT PART OF THE MEDICAL RECORD  Please update your documentation within the medical record to reflect your response to this query.                                                                                        03/26/12   Dear Dr.Jahmya Onofrio / Associates,  In a better effort to capture your patient's severity of illness, reflect appropriate length of stay and utilization of resources, a review of the patient medical record has revealed the following indicators.   Based on your clinical judgment, please clarify and document in a progress note and/or discharge summary the clinical condition associated with the following supporting information: In responding to this query please exercise your independent judgment.  The fact that a query is asked, does not imply that any particular answer is desired or expected.   Hello Dr. Isidoro Donning!  [name] was admitted with experiencing diarrhea over the last one week. Patient also with history of cerebral palsy with lower extremity contractures. If possible, please help clarify the suspected diagnosis causing (these symptoms/observations). Thank you!   Possible Clinical Conditions?  - Lower Extremities Paraplegia  - Lower Extremities Paraparesis  - Other condition (please document in the progress notes and/or discharge summary)  - Cannot Clinically determine at this time      Reviewed: Patient has diarrhea due to C. difficile colitis. I cannot clinically determine at this time what the diagnosis is about chronic left extremity contractures. The C. difficile colitis has been mentioned in my progress notes. Rod Can, MD  Thank [name],  Saul Fordyce  Clinical Documentation Specialist: [phone] Pager  Health Information Management [place]

## 2012-03-26 NOTE — Progress Notes (Signed)
Subjective: Patient seen and examined. Patient relates abdominal pain when she eats. She has had multiple BM.  She denies abdominal pain.  Objective: Filed Vitals:   03/25/12 2115 03/25/12 2130 03/25/12 2145 03/25/12 2221  BP: 118/71 125/68  132/81  Pulse: 85 84  84  Temp:   97.9 F (36.6 C) 97.9 F (36.6 C)  TempSrc:   Oral Oral  Resp: 22 22  20   Height:    4\' 10"  (1.473 m)  Weight:    54.4 kg (119 lb 14.9 oz)  SpO2: 92% 94% 94% 93%   Weight change:    General: Alert, awake,in no acute distress.  HEENT: No bruits, no goiter.  Heart: Regular rate and rhythm, without murmurs, rubs, gallops.  Lungs: Crackles bases, bilateral air movement.  Abdomen: Soft, nontender, nondistended, positive bowel sounds, no rigidity, no gurding.  Extremities; No edema.    Lab Results:  [facility] 03/25/12 1716  NA 138  K 3.2*  CL 105  CO2 24  GLUCOSE 88  BUN <3*  CREATININE 0.40*  CALCIUM 8.3*  MG --  PHOS --    Basename 03/25/12 2230  AST 21  ALT 14  ALKPHOS 103  BILITOT 0.3  PROT 5.5*  ALBUMIN 2.3*    Basename 03/25/12 2230  LIPASE 32  AMYLASE --    Basename 03/25/12 1716  WBC 8.7  NEUTROABS 5.3  HGB 11.8*  HCT 34.7*  MCV 83.8  PLT 300    Micro Results: Recent Results (from the past 240 hour(s))  MRSA PCR SCREENING     Status: Abnormal   Collection Time   03/26/12  1:18 AM      Component Value Range Status Comment   MRSA by PCR POSITIVE (*) NEGATIVE Final     Studies/Results: Ct Abdomen Pelvis W Contrast  03/26/2012  *RADIOLOGY REPORT*  Clinical Data: Nausea, vomiting, diarrhea.  CT ABDOMEN AND PELVIS WITH CONTRAST  Technique:  Multidetector CT imaging of the abdomen and pelvis was performed following the standard protocol during bolus administration of intravenous contrast.  Contrast: 1 OMNIPAQUE IOHEXOL 300 MG/ML  SOLN, OMNIPAQUE IOHEXOL 300 MG/ML  SOLN  Comparison: None.  Findings: Bilateral pleural effusions with associated consolidations;  atelectasis versus infiltrate. The heart size is upper normal.  Moderate hiatal hernia.  Unremarkable liver.  Distended gallbladder.  No radiodense gallstones.  No biliary duct dilatation.  Mild prominence of the main pancreatic duct.  Otherwise homogeneous pancreatic enhancement.  Unremarkable spleen and adrenal glands.  Symmetric renal enhancement.  No hydronephrosis or hydroureter.  Colonic wall thickening, most pronounced involving the transverse, ascending colon, and cecum.  The appendix is distended up to 12 mm and fails to opacify with contrast.  No free intraperitoneal air or fluid.  No lymphadenopathy.  There is scattered atherosclerotic calcification of the aorta and its branches. No aneurysmal dilatation.  Thin-walled bladder.  Unremarkable CT appearance to the uterus.  No adnexal mass.  Small amount of free fluid within the pelvis.  Mild stranding of the mesorectal fat.  Postoperative changes of the right femur.  Osteopenia.  Multilevel degenerative changes. Fatty atrophy of the paraspinal musculature.  IMPRESSION: Colonic wall thickening, most pronounced proximally.  In keeping with a nonspecific colitis with infectious, inflammatory, and ischemic considerations.  Pseudomembranous colitis is on the differential.  The appendix is distended up to 12 mm.  This may be secondary to the above described process; however, appendicitis or mucocele is not excluded.  Bilateral pleural effusions with associated consolidations; atelectasis versus  pneumonia.  Moderate hiatal hernia.  Dilated main pancreatic duct.  No obstructing lesion identified by CT.  Can be further evaluated with ERCP or MRCP.  Discussed via telephone with [name], NP covering the patient (pager [phone]) at 04:30 a.m. on 03/26/2012   Original Report Authenticated By: Waneta Martins, M.D.     Medications: I have reviewed the patient's current medications.  1. Diarrhea - Check for stool culture. CT show colitis, infectious, inflammatory,  vs ischemic. I will start Cipro and flagyl. Will make patient NPO. C diff pending. Continue with IV fluids.   2. UTI - Received a dose of Ceftriaxone. Cipro should cover. Follow up urine culture.  3. Recent admission for GI bleed secondary to esophageal ulcer - continue Protonix and Carafate. Monitor CBC.  4. Cerebral palsy with lower extremity contractures: support care.  5-Hypokalemia: received 1 dose 40 meq. K-CL with IV. B-met pending this am.  6-CT result: The appendix is distended up to 12 mm:  appendicitis or mucocele is  not excluded: I will check Lactic acid, surgery consulted.        LOS: 1 day   Xzander Gilham M.D.  [facility] 03/26/2012, 7:34 AM

## 2012-03-26 NOTE — Consult Note (Signed)
Reason for Consult:Possible appendicitis Referring Physician: [name] is an 76 y.o. female.  HPI: This is a 76 yo female with cerebral palsy who is a resident at [facility].  She presents with crampy diarrhea over the last week.  The patient states that when she eats, she develops cramping in the mid-abdomen, followed by watery greenish diarrhea.  The cramping resolves after the diarrhea.  She remains hungry.  She was also found to have a urinary tract infection with leukocytosis.  She is currently being ruled out for C. Diff.  Currently she has no other complaints except for the diarrhea.   Past Medical History  Diagnosis Date  . Constipation   . Cerebral palsy   . Hyperlipidemia     Past Surgical History  Procedure Date  . Right hip surgery   . Tonsillectomy   . Esophagogastroduodenoscopy 01/28/2012    Procedure: ESOPHAGOGASTRODUODENOSCOPY (EGD);  Surgeon: Shirley Friar, MD;  Location: [facility] ENDOSCOPY;  Service: Endoscopy;  Laterality: N/A;    History reviewed. No pertinent family history.  Social History:  reports that she has never smoked. She does not have any smokeless tobacco history on file. She reports that she does not drink alcohol or use illicit drugs.  Allergies: No Known Allergies  Medications: I have reviewed the patient's current medications.  Results for orders placed during the hospital encounter of 03/25/12 (from the past 48 hour(s))  CBC WITH DIFFERENTIAL     Status: Abnormal   Collection Time   03/25/12  5:16 PM      Component Value Range Comment   WBC 8.7  4.0 - 10.5 K/uL    RBC 4.14  3.87 - 5.11 MIL/uL    Hemoglobin 11.8 (*) 12.0 - 15.0 g/dL    HCT 16.1 (*) 09.6 - 46.0 %    MCV 83.8  78.0 - 100.0 fL    MCH 28.5  26.0 - 34.0 pg    MCHC 34.0  30.0 - 36.0 g/dL    RDW 04.5  40.9 - 81.1 %    Platelets 300  150 - 400 K/uL    Neutrophils Relative 62  43 - 77 %    Lymphocytes Relative 18  12 - 46 %    Monocytes Relative 17 (*) 3 - 12 %     Eosinophils Relative 2  0 - 5 %    Basophils Relative 1  0 - 1 %    Neutro Abs 5.3  1.7 - 7.7 K/uL    Lymphs Abs 1.6  0.7 - 4.0 K/uL    Monocytes Absolute 1.5 (*) 0.1 - 1.0 K/uL    Eosinophils Absolute 0.2  0.0 - 0.7 K/uL    Basophils Absolute 0.1  0.0 - 0.1 K/uL    Smear Review MORPHOLOGY UNREMARKABLE     BASIC METABOLIC PANEL     Status: Abnormal   Collection Time   03/25/12  5:16 PM      Component Value Range Comment   Sodium 138  135 - 145 mEq/L    Potassium 3.2 (*) 3.5 - 5.1 mEq/L    Chloride 105  96 - 112 mEq/L    CO2 24  19 - 32 mEq/L    Glucose, Bld 88  70 - 99 mg/dL    BUN <3 (*) 6 - 23 mg/dL    Creatinine, Ser 9.14 (*) 0.50 - 1.10 mg/dL    Calcium 8.3 (*) 8.4 - 10.5 mg/dL    GFR calc  non Af Amer >90  >90 mL/min    GFR calc Af Amer >90  >90 mL/min   URINALYSIS, ROUTINE W REFLEX MICROSCOPIC     Status: Abnormal   Collection Time   03/25/12  6:14 PM      Component Value Range Comment   Color, Urine YELLOW  YELLOW    APPearance CLOUDY (*) CLEAR    Specific Gravity, Urine 1.011  1.005 - 1.030    pH 7.0  5.0 - 8.0    Glucose, UA NEGATIVE  NEGATIVE mg/dL    Hgb urine dipstick MODERATE (*) NEGATIVE    Bilirubin Urine NEGATIVE  NEGATIVE    Ketones, ur 40 (*) NEGATIVE mg/dL    Protein, ur NEGATIVE  NEGATIVE mg/dL    Urobilinogen, UA 0.2  0.0 - 1.0 mg/dL    Nitrite NEGATIVE  NEGATIVE    Leukocytes, UA LARGE (*) NEGATIVE   URINE MICROSCOPIC-ADD ON     Status: Abnormal   Collection Time   03/25/12  6:14 PM      Component Value Range Comment   WBC, UA TOO NUMEROUS TO COUNT  <3 WBC/hpf    RBC / HPF 0-2  <3 RBC/hpf    Bacteria, UA FEW (*) RARE    Urine-Other MUCOUS PRESENT     HEPATIC FUNCTION PANEL     Status: Abnormal   Collection Time   03/25/12 10:30 PM      Component Value Range Comment   Total Protein 5.5 (*) 6.0 - 8.3 g/dL    Albumin 2.3 (*) 3.5 - 5.2 g/dL    AST 21  0 - 37 U/L    ALT 14  0 - 35 U/L    Alkaline Phosphatase 103  39 - 117 U/L    Total Bilirubin  0.3  0.3 - 1.2 mg/dL    Bilirubin, Direct <1.6  0.0 - 0.3 mg/dL    Indirect Bilirubin NOT CALCULATED  0.3 - 0.9 mg/dL   LIPASE, BLOOD     Status: Normal   Collection Time   03/25/12 10:30 PM      Component Value Range Comment   Lipase 32  11 - 59 U/L   MRSA PCR SCREENING     Status: Abnormal   Collection Time   03/26/12  1:18 AM      Component Value Range Comment   MRSA by PCR POSITIVE (*) NEGATIVE   CBC     Status: Abnormal   Collection Time   03/26/12  7:49 AM      Component Value Range Comment   WBC 7.7  4.0 - 10.5 K/uL    RBC 4.05  3.87 - 5.11 MIL/uL    Hemoglobin 11.4 (*) 12.0 - 15.0 g/dL    HCT 10.9 (*) 60.4 - 46.0 %    MCV 84.0  78.0 - 100.0 fL    MCH 28.1  26.0 - 34.0 pg    MCHC 33.5  30.0 - 36.0 g/dL    RDW 54.0  98.1 - 19.1 %    Platelets 316  150 - 400 K/uL     Ct Abdomen Pelvis W Contrast  03/26/2012  *RADIOLOGY REPORT*  Clinical Data: Nausea, vomiting, diarrhea.  CT ABDOMEN AND PELVIS WITH CONTRAST  Technique:  Multidetector CT imaging of the abdomen and pelvis was performed following the standard protocol during bolus administration of intravenous contrast.  Contrast: 1 OMNIPAQUE IOHEXOL 300 MG/ML  SOLN, OMNIPAQUE IOHEXOL 300 MG/ML  SOLN  Comparison: None.  Findings: Bilateral  pleural effusions with associated consolidations; atelectasis versus infiltrate. The heart size is upper normal.  Moderate hiatal hernia.  Unremarkable liver.  Distended gallbladder.  No radiodense gallstones.  No biliary duct dilatation.  Mild prominence of the main pancreatic duct.  Otherwise homogeneous pancreatic enhancement.  Unremarkable spleen and adrenal glands.  Symmetric renal enhancement.  No hydronephrosis or hydroureter.  Colonic wall thickening, most pronounced involving the transverse, ascending colon, and cecum.  The appendix is distended up to 12 mm and fails to opacify with contrast.  No free intraperitoneal air or fluid.  No lymphadenopathy.  There is scattered atherosclerotic  calcification of the aorta and its branches. No aneurysmal dilatation.  Thin-walled bladder.  Unremarkable CT appearance to the uterus.  No adnexal mass.  Small amount of free fluid within the pelvis.  Mild stranding of the mesorectal fat.  Postoperative changes of the right femur.  Osteopenia.  Multilevel degenerative changes. Fatty atrophy of the paraspinal musculature.  IMPRESSION: Colonic wall thickening, most pronounced proximally.  In keeping with a nonspecific colitis with infectious, inflammatory, and ischemic considerations.  Pseudomembranous colitis is on the differential.  The appendix is distended up to 12 mm.  This may be secondary to the above described process; however, appendicitis or mucocele is not excluded.  Bilateral pleural effusions with associated consolidations; atelectasis versus pneumonia.  Moderate hiatal hernia.  Dilated main pancreatic duct.  No obstructing lesion identified by CT.  Can be further evaluated with ERCP or MRCP.  Discussed via telephone with [name], NP covering the patient (pager [phone]) at 04:30 a.m. on 03/26/2012   Original Report Authenticated By: Waneta Martins, M.D.     Review of Systems  Eyes: Negative.   Gastrointestinal: Positive for abdominal pain and diarrhea.  Genitourinary: Positive for dysuria.  Neurological: Negative.   Endo/Heme/Allergies: Negative.    Blood pressure 132/81, pulse 84, temperature 97.9 F (36.6 C), temperature source Oral, resp. rate 20, height 4\' 10"  (1.473 m), weight 119 lb 14.9 oz (54.4 kg), SpO2 93.00%. Physical Exam Elderly female in NAD Lungs - CTA B CV - RRR Abd - soft, non-distended; no palpable masses No abdominal tenderness in any quadrants No sign of hernia  Assessment/Plan: Colitis - possible C. Diff; significant thickening of the colonic wall on CT scan.  Doubt this is ischemic, as the patient has very little abdominal tenderness.  Mildly distended appendix, but no clinical signs of appendicitis (normal  WBC this AM, no tenderness)  Recs:  No acute surgical indiciations.  Rule out and treat C. Diff.  Agree with current antibiotics.  Will follow for now.   Prestyn Mahn K. 03/26/2012, 8:53 AM

## 2012-03-26 NOTE — Progress Notes (Signed)
CRITICAL VALUE ALERT  Critical value received:  POSITIVE MRSA PCR  Date of notification:  03/26/2012  Time of notification:  0450  Critical value read back:yes  Nurse who received alert:  Danaly Bari   MD notified (1st page):  NP [facility]  Time of first page:  0455  MD notified (2nd page):  Time of second page:  Responding MD:  NP [facility]  Time MD responded:  [phone]

## 2012-03-27 DIAGNOSIS — A0472 Enterocolitis due to Clostridium difficile, not specified as recurrent: Secondary | ICD-10-CM | POA: Diagnosis present

## 2012-03-27 DIAGNOSIS — Z8719 Personal history of other diseases of the digestive system: Secondary | ICD-10-CM

## 2012-03-27 DIAGNOSIS — K5289 Other specified noninfective gastroenteritis and colitis: Secondary | ICD-10-CM

## 2012-03-27 LAB — URINE CULTURE

## 2012-03-27 LAB — BASIC METABOLIC PANEL
BUN: <3 mg/dL — ABNORMAL LOW (ref 6–23)
Calcium: 8.7 mg/dL (ref 8.4–10.5)
Creatinine, Ser: 0.42 mg/dL — ABNORMAL LOW (ref 0.50–1.10)
GFR calc Af Amer: >90 mL/min (ref >90)
GFR calc non Af Amer: >90 mL/min (ref >90)
Glucose, Bld: 80 mg/dL (ref 70–99)
Potassium: 3.4 mEq/L — ABNORMAL LOW (ref 3.5–5.1)

## 2012-03-27 LAB — CBC
HCT: 34.7 % — ABNORMAL LOW (ref 36.0–46.0)
MCH: 28.4 pg (ref 26.0–34.0)
MCHC: 33.4 g/dL (ref 30.0–36.0)
RDW: 15 % (ref 11.5–15.5)

## 2012-03-27 MED ORDER — METRONIDAZOLE IN NACL 5-0.79 MG/ML-% IV SOLN
500.0000 mg | Freq: Three times a day (TID) | INTRAVENOUS | Status: DC
  Administered 2012-03-27 – 2012-03-30 (×9): 500 mg via INTRAVENOUS
  Filled 2012-03-27 (×10): qty 100

## 2012-03-27 MED ORDER — FAMOTIDINE 20 MG PO TABS
20.0000 mg | ORAL_TABLET | Freq: Two times a day (BID) | ORAL | Status: DC
  Administered 2012-03-27 – 2012-03-30 (×7): 20 mg via ORAL
  Filled 2012-03-27 (×8): qty 1

## 2012-03-27 MED ORDER — VANCOMYCIN 50 MG/ML ORAL SOLUTION
500.0000 mg | Freq: Four times a day (QID) | ORAL | Status: DC
  Administered 2012-03-27 – 2012-03-30 (×11): 500 mg via ORAL
  Filled 2012-03-27 (×15): qty 10

## 2012-03-27 MED ORDER — SACCHAROMYCES BOULARDII 250 MG PO CAPS
250.0000 mg | ORAL_CAPSULE | Freq: Two times a day (BID) | ORAL | Status: DC
  Administered 2012-03-27 – 2012-03-30 (×7): 250 mg via ORAL
  Filled 2012-03-27 (×8): qty 1

## 2012-03-27 NOTE — Progress Notes (Addendum)
Patient ID: [name]  female  ZOX:[id number]    DOB: [birth year]    DOA: 03/25/2012  PCP: Oneal Grout, MD  Subjective: Diarrhea resolving, no abdominal pain, no nausea vomiting. No fever or chills  Objective: Weight change:  No intake or output data in the 24 hours ending 03/27/12 1159 Blood pressure 136/63, pulse 74, temperature 98.1 F (36.7 C), temperature source Oral, resp. rate 18, height 4\' 10"  (1.473 m), weight 54.4 kg (119 lb 14.9 oz), SpO2 91.00%.  Physical Exam: General: Alert and awake, not in any acute distress. HEENT: anicteric sclera, pupils reactive to light and accommodation, EOMI CVS: S1-S2 clear, no murmur rubs or gallops Chest: clear to auscultation bilaterally, no wheezing, rales or rhonchi Abdomen: soft nontender, nondistended, normal bowel sounds, no organomegaly Extremities: no cyanosis, clubbing or edema noted bilaterally   Lab Results: Basic Metabolic Panel:  Lab 03/27/12 8119 03/26/12 0749  NA 141 142  K 3.4* 3.3*  CL 107 108  CO2 24 24  GLUCOSE 80 92  BUN <3* <3*  CREATININE 0.42* 0.38*  CALCIUM 8.7 8.2*  MG -- --  PHOS -- --   Liver Function Tests:  Lab 03/26/12 0749 03/25/12 2230  AST 14 21  ALT 11 14  ALKPHOS 102 103  BILITOT 0.2* 0.3  PROT 5.3* 5.5*  ALBUMIN 2.2* 2.3*    Lab 03/25/12 2230  LIPASE 32  AMYLASE --   CBC:  Lab 03/27/12 0648 03/26/12 0846 03/25/12 1716  WBC 7.1 8.6 --  NEUTROABS -- -- 5.3  HGB 11.6* 11.9* --  HCT 34.7* 35.0* --  MCV 84.8 83.5 --  PLT 341 331 --      Micro Results: Recent Results (from the past 240 hour(s))  CLOSTRIDIUM DIFFICILE BY PCR     Status: Abnormal   Collection Time   03/25/12 11:33 PM      Component Value Range Status Comment   C difficile by pcr POSITIVE (*) NEGATIVE Final   STOOL CULTURE     Status: Normal (Preliminary result)   Collection Time   03/25/12 11:33 PM      Component Value Range Status Comment   Specimen Description STOOL   Final    Special Requests NONE    Final    Culture Culture reincubated for better growth   Final    Report Status PENDING   Incomplete   URINE CULTURE     Status: Normal   Collection Time   03/26/12 12:46 AM      Component Value Range Status Comment   Specimen Description URINE, RANDOM   Final    Special Requests NONE   Final    Culture  Setup Time 03/26/2012 08:52   Final    Colony Count 9,000 COLONIES/ML   Final    Culture INSIGNIFICANT GROWTH   Final    Report Status 03/27/2012 FINAL   Final   MRSA PCR SCREENING     Status: Abnormal   Collection Time   03/26/12  1:18 AM      Component Value Range Status Comment   MRSA by PCR POSITIVE (*) NEGATIVE Final     Studies/Results: Ct Abdomen Pelvis W Contrast  03/26/2012  *RADIOLOGY REPORT*  Clinical Data: Nausea, vomiting, diarrhea.  CT ABDOMEN AND PELVIS WITH CONTRAST  Technique:  Multidetector CT imaging of the abdomen and pelvis was performed following the standard protocol during bolus administration of intravenous contrast.  Contrast: 1 OMNIPAQUE IOHEXOL 300 MG/ML  SOLN, OMNIPAQUE IOHEXOL 300 MG/ML  SOLN  Comparison: None.  Findings: Bilateral pleural effusions with associated consolidations; atelectasis versus infiltrate. The heart size is upper normal.  Moderate hiatal hernia.  Unremarkable liver.  Distended gallbladder.  No radiodense gallstones.  No biliary duct dilatation.  Mild prominence of the main pancreatic duct.  Otherwise homogeneous pancreatic enhancement.  Unremarkable spleen and adrenal glands.  Symmetric renal enhancement.  No hydronephrosis or hydroureter.  Colonic wall thickening, most pronounced involving the transverse, ascending colon, and cecum.  The appendix is distended up to 12 mm and fails to opacify with contrast.  No free intraperitoneal air or fluid.  No lymphadenopathy.  There is scattered atherosclerotic calcification of the aorta and its branches. No aneurysmal dilatation.  Thin-walled bladder.  Unremarkable CT appearance to the uterus.  No  adnexal mass.  Small amount of free fluid within the pelvis.  Mild stranding of the mesorectal fat.  Postoperative changes of the right femur.  Osteopenia.  Multilevel degenerative changes. Fatty atrophy of the paraspinal musculature.  IMPRESSION: Colonic wall thickening, most pronounced proximally.  In keeping with a nonspecific colitis with infectious, inflammatory, and ischemic considerations.  Pseudomembranous colitis is on the differential.  The appendix is distended up to 12 mm.  This may be secondary to the above described process; however, appendicitis or mucocele is not excluded.  Bilateral pleural effusions with associated consolidations; atelectasis versus pneumonia.  Moderate hiatal hernia.  Dilated main pancreatic duct.  No obstructing lesion identified by CT.  Can be further evaluated with ERCP or MRCP.  Discussed via telephone with [name], NP covering the patient (pager [phone]) at 04:30 a.m. on 03/26/2012   Original Report Authenticated By: Waneta Martins, M.D.     Medications: Scheduled Meds:   . Chlorhexidine Gluconate Cloth  6 each Topical Q0600  . citalopram  20 mg Oral Daily  . ketoconazole  1 application Topical Weekly  . metronidazole  500 mg Intravenous Q8H  . mupirocin ointment  1 application Nasal BID  . pantoprazole  40 mg Oral BID  . potassium chloride  10 mEq Intravenous Q1 Hr x 3  . sodium chloride  500 mL Intravenous Once  . sucralfate  1 g Oral TID WC & HS  . DISCONTD: ciprofloxacin  400 mg Intravenous Q12H   Continuous Infusions:   . 0.9 % NaCl with KCl 20 mEq / L 75 mL/hr at 03/26/12 0025     Assessment/Plan: Principal Problem:  *C. difficile colitis: - C. diff PCR positive. Continue Flagyl IV for today, if patient tolerating liquid diet, will change to oral Flagyl for 14 days - Added florastor, appreciate surgery recommendations  Active Problems:  UTI (lower urinary tract infection) - Urine culture shows only 9000 colonies, with insignificant  growth, DC ciprofloxacin in light of ongoing C. difficile colitis  Cerebral palsy; at baseline   H/O: GI bleed - Will place on Pepcid twice a day. Patient can be transitioned back to PPI once C. difficile colitis has resolved.  Hypokalemia: Replaced  DVT Prophylaxis: SCDs  Code Status: Full   Disposition: Not medically ready   LOS: 2 days   Sarena Jezek M.D. [facility] 03/27/2012, 11:59 AM Pager: [phone]  If 7PM-7AM, please contact night-coverage [email or web address] Password [email or web address]  Addendum: - Patient is now tolerating liquid diet. As patient had pseudomembranous colitis on the CT abdomen and pelvis, will treat the C. difficile colitis as severe per the order pathway. I also discussed in detail with Dr. Algis Liming (ID on-call who also recommended treating the patient with  oral vancomycin x 14 days and IV Flagyl for couple of more days until she has improved.    Allyn Bartelson M.D. [facility] 03/27/2012, 3:38 PM  Pager: [phone]

## 2012-03-27 NOTE — Clinical Social Work Psychosocial (Signed)
     Clinical Social Work Department BRIEF PSYCHOSOCIAL ASSESSMENT 03/27/2012  Patient:  [name], [name]     Account Number:  [id number]     Admit date:  03/25/2012  Clinical Social Worker:  Peggyann Shoals  Date/Time:  03/27/2012 05:03 PM  Referred by:  Physician  Date Referred:  03/27/2012 Referred for  Other - See comment   Other Referral:   Pt was admitted from a facility   Interview type:  Patient Other interview type:    PSYCHOSOCIAL DATA Living Status:  FACILITY Admitted from facility:  [facility] Level of care:  Skilled Nursing Facility Primary support name:  [name] Primary support relationship to patient:  FAMILY Degree of support available:   Supportive. Pt's cousins    CURRENT CONCERNS Current Concerns  Post-Acute Placement   Other Concerns:    SOCIAL WORK ASSESSMENT / PLAN CSW met with pt to address consult. Pt was admitted from [facility]. Facility is able to accept pt when she is ready. Pt stated that she has been at [facility] for a year, however she would like to go to another facility. CSW explained the process of transfering SNFs. Pt stated that she was on the list for [facility].    Pt stated that her cousin is a support and is agreeable to this CSW speaking with her. CSW will attempt to contact pt's counsin and address SNF transfer.    CSW will send referral for SNF to [facility] (pt's choice) to inquire about bed availability. CSW will continue to follow.   Assessment/plan status:  Psychosocial Support/Ongoing Assessment of Needs Other assessment/ plan:   Information/referral to community resources:   SNF List    PATIENTS/FAMILYS RESPONSE TO PLAN OF CARE: Pt was alert and oriented. Pt was very pleasant and would like to go to [facility] at discharge.

## 2012-03-27 NOTE — Progress Notes (Signed)
Subjective: Resting in bed comfortably, no c/o of further diarrhea episodes per patient.  Objective: Vital signs in last 24 hours: Temp:  [97.5 F (36.4 C)-98.1 F (36.7 C)] 98.1 F (36.7 C) (08/28 0600) Pulse Rate:  [74-88] 74  (08/28 0600) Resp:  [18] 18  (08/28 0600) BP: (130-136)/(63-88) 136/63 mmHg (08/28 0600) SpO2:  [91 %-95 %] 91 % (08/28 0600) Last BM Date: 03/26/12  Intake/Output from previous day:   Intake/Output this shift:    General appearance: alert, cooperative, appears stated age and no distress Abdomen: diffusely tender, but no focal area of discomfort. No bloating, +BS,flatus. Stool x1 recorded. Stool was C-Diff +  on Flagyl and Cipro. VSS, afebrile WBC wnl.  Lab Results:   Basename 03/27/12 0648 03/26/12 0846  WBC 7.1 8.6  HGB 11.6* 11.9*  HCT 34.7* 35.0*  PLT 341 331   BMET  Basename 03/27/12 0648 03/26/12 0749  NA 141 142  K 3.4* 3.3*  CL 107 108  CO2 24 24  GLUCOSE 80 92  BUN <3* <3*  CREATININE 0.42* 0.38*  CALCIUM 8.7 8.2*   PT/INR No results found for this basename: LABPROT:2,INR:2 in the last 72 hours ABG No results found for this basename: PHART:2,PCO2:2,PO2:2,HCO3:2 in the last 72 hours  Studies/Results: Ct Abdomen Pelvis W Contrast  03/26/2012  *RADIOLOGY REPORT*  Clinical Data: Nausea, vomiting, diarrhea.  CT ABDOMEN AND PELVIS WITH CONTRAST  Technique:  Multidetector CT imaging of the abdomen and pelvis was performed following the standard protocol during bolus administration of intravenous contrast.  Contrast: 1 OMNIPAQUE IOHEXOL 300 MG/ML  SOLN, OMNIPAQUE IOHEXOL 300 MG/ML  SOLN  Comparison: None.  Findings: Bilateral pleural effusions with associated consolidations; atelectasis versus infiltrate. The heart size is upper normal.  Moderate hiatal hernia.  Unremarkable liver.  Distended gallbladder.  No radiodense gallstones.  No biliary duct dilatation.  Mild prominence of the main pancreatic duct.  Otherwise homogeneous  pancreatic enhancement.  Unremarkable spleen and adrenal glands.  Symmetric renal enhancement.  No hydronephrosis or hydroureter.  Colonic wall thickening, most pronounced involving the transverse, ascending colon, and cecum.  The appendix is distended up to 12 mm and fails to opacify with contrast.  No free intraperitoneal air or fluid.  No lymphadenopathy.  There is scattered atherosclerotic calcification of the aorta and its branches. No aneurysmal dilatation.  Thin-walled bladder.  Unremarkable CT appearance to the uterus.  No adnexal mass.  Small amount of free fluid within the pelvis.  Mild stranding of the mesorectal fat.  Postoperative changes of the right femur.  Osteopenia.  Multilevel degenerative changes. Fatty atrophy of the paraspinal musculature.  IMPRESSION: Colonic wall thickening, most pronounced proximally.  In keeping with a nonspecific colitis with infectious, inflammatory, and ischemic considerations.  Pseudomembranous colitis is on the differential.  The appendix is distended up to 12 mm.  This may be secondary to the above described process; however, appendicitis or mucocele is not excluded.  Bilateral pleural effusions with associated consolidations; atelectasis versus pneumonia.  Moderate hiatal hernia.  Dilated main pancreatic duct.  No obstructing lesion identified by CT.  Can be further evaluated with ERCP or MRCP.  Discussed via telephone with [name], NP covering the patient (pager [phone]) at 04:30 a.m. on 03/26/2012   Original Report Authenticated By: Waneta Martins, M.D.     Anti-infectives: Anti-infectives     Start     Dose/Rate Route Frequency Ordered Stop   03/26/12 0800   ciprofloxacin (CIPRO) IVPB 400 mg  Status:  Discontinued        400 mg 200 mL/hr over 60 Minutes Intravenous Every 12 hours 03/26/12 0723 03/27/12 0756   03/26/12 0800   metroNIDAZOLE (FLAGYL) IVPB 500 mg        500 mg 100 mL/hr over 60 Minutes Intravenous Every 8 hours 03/26/12 0723      03/25/12 2230   cefTRIAXone (ROCEPHIN) 1 g in dextrose 5 % 50 mL IVPB  Status:  Discontinued        1 g 100 mL/hr over 30 Minutes Intravenous Every 24 hours 03/25/12 2214 03/26/12 0722   03/25/12 1930   cefTRIAXone (ROCEPHIN) 1 g in dextrose 5 % 50 mL IVPB        1 g 100 mL/hr over 30 Minutes Intravenous  Once 03/25/12 1921 03/25/12 2034          Assessment/Plan: s/p * No surgery found *  1. Colitis: significant thickening of the colonic wall on CT scan. 2. C-Diff positive: Agree with current abx therapy. 3. No acute surgical indications:  4.WBC continues to trend downward, and she remains w/o any clinical signs of acute appendicitis.  5.We will sign off at this point.    LOS: 2 days    [name] 03/27/2012

## 2012-03-27 NOTE — Progress Notes (Signed)
Agree with above.  No clinical signs of appendicitis.  Diarrhea resolving.  Call us if needed.  Wilmon Arms. Corliss Skains, MD, [facility]  03/27/2012 9:58 AM

## 2012-03-28 NOTE — Progress Notes (Signed)
Patient ID: [name]  female  [id number]:[id number]    DOB: 1936/06/02    DOA: 03/25/2012  PCP: Oneal Grout, MD  Subjective: Diarrhea resolving, no abdominal pain, no nausea vomiting. No fever or chills. Tolerating full liquid diet.   Objective: Weight change:   Intake/Output Summary (Last 24 hours) at 03/28/12 1052 Last data filed at 03/27/12 1847  Gross per 24 hour  Intake    100 ml  Output      0 ml  Net    100 ml   Blood pressure 119/62, pulse 77, temperature 97.8 F (36.6 C), temperature source Oral, resp. rate 18, height 4\' 10"  (1.473 m), weight 54.4 kg (119 lb 14.9 oz), SpO2 92.00%.  Physical Exam: General: Alert and awake, not in any acute distress. HEENT: anicteric sclera, pupils reactive to light and accommodation, EOMI CVS: S1-S2 clear, no murmur rubs or gallops Chest: clear to auscultation bilaterally, no wheezing, rales or rhonchi Abdomen: soft nontender, nondistended, normal bowel sounds, no organomegaly Extremities: no cyanosis, clubbing or edema noted bilaterally   Lab Results: Basic Metabolic Panel:  Lab 03/27/12 4034 03/26/12 0749  NA 141 142  K 3.4* 3.3*  CL 107 108  CO2 24 24  GLUCOSE 80 92  BUN <3* <3*  CREATININE 0.42* 0.38*  CALCIUM 8.7 8.2*  MG -- --  PHOS -- --   Liver Function Tests:  Lab 03/26/12 0749 03/25/12 2230  AST 14 21  ALT 11 14  ALKPHOS 102 103  BILITOT 0.2* 0.3  PROT 5.3* 5.5*  ALBUMIN 2.2* 2.3*    Lab 03/25/12 2230  LIPASE 32  AMYLASE --   CBC:  Lab 03/27/12 0648 03/26/12 0846 03/25/12 1716  WBC 7.1 8.6 --  NEUTROABS -- -- 5.3  HGB 11.6* 11.9* --  HCT 34.7* 35.0* --  MCV 84.8 83.5 --  PLT 341 331 --      Micro Results: Recent Results (from the past 240 hour(s))  CLOSTRIDIUM DIFFICILE BY PCR     Status: Abnormal   Collection Time   03/25/12 11:33 PM      Component Value Range Status Comment   C difficile by pcr POSITIVE (*) NEGATIVE Final   STOOL CULTURE     Status: Normal (Preliminary result)   Collection Time   03/25/12 11:33 PM      Component Value Range Status Comment   Specimen Description STOOL   Final    Special Requests NONE   Final    Culture Culture reincubated for better growth   Final    Report Status PENDING   Incomplete   URINE CULTURE     Status: Normal   Collection Time   03/26/12 12:46 AM      Component Value Range Status Comment   Specimen Description URINE, RANDOM   Final    Special Requests NONE   Final    Culture  Setup Time 03/26/2012 08:52   Final    Colony Count 9,000 COLONIES/ML   Final    Culture INSIGNIFICANT GROWTH   Final    Report Status 03/27/2012 FINAL   Final   MRSA PCR SCREENING     Status: Abnormal   Collection Time   03/26/12  1:18 AM      Component Value Range Status Comment   MRSA by PCR POSITIVE (*) NEGATIVE Final     Studies/Results: Ct Abdomen Pelvis W Contrast  03/26/2012  *RADIOLOGY REPORT*  Clinical Data: Nausea, vomiting, diarrhea.  CT ABDOMEN AND PELVIS WITH CONTRAST  Technique:  Multidetector CT imaging of the abdomen and pelvis was performed following the standard protocol during bolus administration of intravenous contrast.  Contrast: 1 OMNIPAQUE IOHEXOL 300 MG/ML  SOLN, OMNIPAQUE IOHEXOL 300 MG/ML  SOLN  Comparison: None.  Findings: Bilateral pleural effusions with associated consolidations; atelectasis versus infiltrate. The heart size is upper normal.  Moderate hiatal hernia.  Unremarkable liver.  Distended gallbladder.  No radiodense gallstones.  No biliary duct dilatation.  Mild prominence of the main pancreatic duct.  Otherwise homogeneous pancreatic enhancement.  Unremarkable spleen and adrenal glands.  Symmetric renal enhancement.  No hydronephrosis or hydroureter.  Colonic wall thickening, most pronounced involving the transverse, ascending colon, and cecum.  The appendix is distended up to 12 mm and fails to opacify with contrast.  No free intraperitoneal air or fluid.  No lymphadenopathy.  There is scattered  atherosclerotic calcification of the aorta and its branches. No aneurysmal dilatation.  Thin-walled bladder.  Unremarkable CT appearance to the uterus.  No adnexal mass.  Small amount of free fluid within the pelvis.  Mild stranding of the mesorectal fat.  Postoperative changes of the right femur.  Osteopenia.  Multilevel degenerative changes. Fatty atrophy of the paraspinal musculature.  IMPRESSION: Colonic wall thickening, most pronounced proximally.  In keeping with a nonspecific colitis with infectious, inflammatory, and ischemic considerations.  Pseudomembranous colitis is on the differential.  The appendix is distended up to 12 mm.  This may be secondary to the above described process; however, appendicitis or mucocele is not excluded.  Bilateral pleural effusions with associated consolidations; atelectasis versus pneumonia.  Moderate hiatal hernia.  Dilated main pancreatic duct.  No obstructing lesion identified by CT.  Can be further evaluated with ERCP or MRCP.  Discussed via telephone with [name], NP covering the patient (pager [phone]) at 04:30 a.m. on 03/26/2012   Original Report Authenticated By: Waneta Martins, M.D.     Medications: Scheduled Meds:    . Chlorhexidine Gluconate Cloth  6 each Topical Q0600  . citalopram  20 mg Oral Daily  . famotidine  20 mg Oral BID  . ketoconazole  1 application Topical Weekly  . vancomycin  500 mg Oral Q6H   And  . metronidazole  500 mg Intravenous Q8H  . mupirocin ointment  1 application Nasal BID  . saccharomyces boulardii  250 mg Oral BID  . sodium chloride  500 mL Intravenous Once  . sucralfate  1 g Oral TID WC & HS  . DISCONTD: metronidazole  500 mg Intravenous Q8H  . DISCONTD: pantoprazole  40 mg Oral BID   Continuous Infusions:    Assessment/Plan: Principal Problem:  *C. difficile colitis: severe with pseudomembranous colitis - Continue Flagyl IV (started on 8/27) for another day or two, vancomycin oral x 14 days, discussed with  ID - if diarrhea is completely resolved, will DC flagyl - Added florastor, appreciate surgery recommendations  Active Problems:  UTI (lower urinary tract infection) - Urine culture shows only 9000 colonies, with insignificant growth, DC'ed ciprofloxacin in light of ongoing C. difficile colitis  Cerebral palsy; at baseline   H/O: GI bleed - Continue Pepcid twice a day. No PPI until C. difficile colitis has resolved.  DVT Prophylaxis: SCDs  Code Status: Full   Disposition: Not medically ready, may be in next 24-48hrs if diarrhea improves   LOS: 3 days   RAI,RIPUDEEP M.D. [facility] 03/28/2012, 10:52 AM Pager: [phone]  If 7PM-7AM, please contact night-coverage [email or web address] Password [email or web address]

## 2012-03-28 NOTE — ED Provider Notes (Signed)
Medical screening examination/treatment/procedure(s) were conducted as a shared visit with non-physician practitioner(s) and myself.  I personally evaluated the patient during the encounter.  Elderly  patient with diarrhea and urinary tract infection.  Not suitable to return to assisted living.  Admit  Donnetta Hutching, MD 03/28/12 [phone]

## 2012-03-28 NOTE — Clinical Social Work Note (Signed)
CSW spoke with pt's cousin, Vernona Rieger, regarding discharge plan. Pt's cousin explained that pt and family has initiated referral for [facility] and pt is a long term SNF pt. CSW shared that referral has been sent to [facility] to potential placement. CSW will continue to follow.   Dede Query, MSW, [facility] [phone]

## 2012-03-28 NOTE — Clinical Social Work Placement (Addendum)
    Clinical Social Work Department CLINICAL SOCIAL WORK PLACEMENT NOTE 03/28/2012  Patient:  [name], [name]  Account Number:  [id number] Admit date:  03/25/2012  Clinical Social Worker:  Peggyann Shoals  Date/time:  03/28/2012 05:10 PM  Clinical Social Work is seeking post-discharge placement for this patient at the following level of care:   SKILLED NURSING   (*CSW will update this form in Epic as items are completed)   03/27/2012  Patient/family provided with [facility] Department of Clinical Social Work's list of facilities offering this level of care within the geographic area requested by the patient (or if unable, by the patient's family).  03/27/2012  Patient/family informed of their freedom to choose among providers that offer the needed level of care, that participate in Medicare, Medicaid or managed care program needed by the patient, have an available bed and are willing to accept the patient.  03/27/2012  Patient/family informed of [facility]' ownership interest in [facility], as well as of the fact that they are under no obligation to receive care at this facility.  PASARR submitted to [employer]DS on Existing PASARR PASARR number received from [employer] on   FL2 transmitted to all facilities in geographic area requested by pt/family on  03/28/2012 FL2 transmitted to all facilities within larger geographic area on   Patient informed that his/her managed care company has contracts with or will negotiate with  certain facilities, including the following:     Patient/family informed of bed offers received:  03/28/2012  Patient chooses bed at [facility] Physician recommends and patient chooses bed at    Patient to be transferred to  on  03/30/2012 Patient to be transferred to facility by [facility] Dorma Russell)  The following physician request were entered in Epic:   Additional Comments:  Ashley Jacobs, MSW LCSW [phone]

## 2012-03-29 LAB — BASIC METABOLIC PANEL
BUN: <3 mg/dL — ABNORMAL LOW (ref 6–23)
Calcium: 8.9 mg/dL (ref 8.4–10.5)
Chloride: 103 mEq/L (ref 96–112)
Creatinine, Ser: 0.47 mg/dL — ABNORMAL LOW (ref 0.50–1.10)
GFR calc Af Amer: >90 mL/min (ref >90)
GFR calc non Af Amer: >90 mL/min (ref >90)

## 2012-03-29 LAB — STOOL CULTURE

## 2012-03-29 MED ORDER — SACCHAROMYCES BOULARDII 250 MG PO CAPS: 250.0000 mg | ORAL_CAPSULE | Freq: Two times a day (BID) | ORAL | Status: AC

## 2012-03-29 MED ORDER — POTASSIUM CHLORIDE CRYS ER 20 MEQ PO TBCR
40.0000 meq | EXTENDED_RELEASE_TABLET | Freq: Once | ORAL | Status: AC
  Administered 2012-03-29: 40 meq via ORAL
  Filled 2012-03-29 (×2): qty 2

## 2012-03-29 MED ORDER — CHOLESTYRAMINE LIGHT 4 G PO PACK
4.0000 g | PACK | Freq: Two times a day (BID) | ORAL | Status: AC
  Administered 2012-03-29 (×2): 4 g via ORAL
  Filled 2012-03-29 (×2): qty 1

## 2012-03-29 MED ORDER — ACETAMINOPHEN 325 MG PO TABS: 650.0000 mg | ORAL_TABLET | Freq: Four times a day (QID) | ORAL | Status: AC | PRN

## 2012-03-29 MED ORDER — VANCOMYCIN 50 MG/ML ORAL SOLUTION: 500.0000 mg | Freq: Four times a day (QID) | ORAL | Status: AC

## 2012-03-29 MED ORDER — FAMOTIDINE 20 MG PO TABS: 20.0000 mg | ORAL_TABLET | Freq: Two times a day (BID) | ORAL | Status: DC

## 2012-03-29 NOTE — Progress Notes (Signed)
Patient ID: [name]  female  WUJ:[id number]    DOB: Jun 14, 1936    DOA: 03/25/2012  PCP: Oneal Grout, MD  Subjective: Diarrhea resolving, although 1 episode today AM. no abdominal pain, no nausea vomiting. No fever or chills. Tolerating solids.   Objective: Weight change:   Intake/Output Summary (Last 24 hours) at 03/29/12 1521 Last data filed at 03/29/12 0951  Gross per 24 hour  Intake    440 ml  Output      0 ml  Net    440 ml   Blood pressure 133/71, pulse 74, temperature 98.2 F (36.8 C), temperature source Oral, resp. rate 18, height 4\' 10"  (1.473 m), weight 54.4 kg (119 lb 14.9 oz), SpO2 94.00%.  Physical Exam: General: Alert and awake, not in any acute distress. HEENT: anicteric sclera, pupils reactive to light and accommodation, EOMI CVS: S1-S2 clear, no murmur rubs or gallops Chest: clear to auscultation bilaterally, no wheezing, rales or rhonchi Abdomen: soft nontender, nondistended, normal bowel sounds, no organomegaly Extremities: no cyanosis, clubbing or edema noted bilaterally   Lab Results: Basic Metabolic Panel:  Lab 03/29/12 9562 03/27/12 0648  NA 138 141  K 3.2* 3.4*  CL 103 107  CO2 24 24  GLUCOSE 120* 80  BUN <3* <3*  CREATININE 0.47* 0.42*  CALCIUM 8.9 8.7  MG -- --  PHOS -- --   Liver Function Tests:  Lab 03/26/12 0749 03/25/12 2230  AST 14 21  ALT 11 14  ALKPHOS 102 103  BILITOT 0.2* 0.3  PROT 5.3* 5.5*  ALBUMIN 2.2* 2.3*    Lab 03/25/12 2230  LIPASE 32  AMYLASE --   CBC:  Lab 03/27/12 0648 03/26/12 0846 03/25/12 1716  WBC 7.1 8.6 --  NEUTROABS -- -- 5.3  HGB 11.6* 11.9* --  HCT 34.7* 35.0* --  MCV 84.8 83.5 --  PLT 341 331 --      Micro Results: Recent Results (from the past 240 hour(s))  CLOSTRIDIUM DIFFICILE BY PCR     Status: Abnormal   Collection Time   03/25/12 11:33 PM      Component Value Range Status Comment   C difficile by pcr POSITIVE (*) NEGATIVE Final   STOOL CULTURE     Status: Normal   Collection  Time   03/25/12 11:33 PM      Component Value Range Status Comment   Specimen Description STOOL   Final    Special Requests NONE   Final    Culture     Final    Value: NO SALMONELLA, SHIGELLA, CAMPYLOBACTER, YERSINIA, OR E.COLI 0157:H7 ISOLATED   Report Status 03/29/2012 FINAL   Final   URINE CULTURE     Status: Normal   Collection Time   03/26/12 12:46 AM      Component Value Range Status Comment   Specimen Description URINE, RANDOM   Final    Special Requests NONE   Final    Culture  Setup Time 03/26/2012 08:52   Final    Colony Count 9,000 COLONIES/ML   Final    Culture INSIGNIFICANT GROWTH   Final    Report Status 03/27/2012 FINAL   Final   MRSA PCR SCREENING     Status: Abnormal   Collection Time   03/26/12  1:18 AM      Component Value Range Status Comment   MRSA by PCR POSITIVE (*) NEGATIVE Final     Studies/Results: Ct Abdomen Pelvis W Contrast  03/26/2012  *RADIOLOGY REPORT*  Clinical Data: Nausea, vomiting, diarrhea.  CT ABDOMEN AND PELVIS WITH CONTRAST  Technique:  Multidetector CT imaging of the abdomen and pelvis was performed following the standard protocol during bolus administration of intravenous contrast.  Contrast: 1 OMNIPAQUE IOHEXOL 300 MG/ML  SOLN, OMNIPAQUE IOHEXOL 300 MG/ML  SOLN  Comparison: None.  Findings: Bilateral pleural effusions with associated consolidations; atelectasis versus infiltrate. The heart size is upper normal.  Moderate hiatal hernia.  Unremarkable liver.  Distended gallbladder.  No radiodense gallstones.  No biliary duct dilatation.  Mild prominence of the main pancreatic duct.  Otherwise homogeneous pancreatic enhancement.  Unremarkable spleen and adrenal glands.  Symmetric renal enhancement.  No hydronephrosis or hydroureter.  Colonic wall thickening, most pronounced involving the transverse, ascending colon, and cecum.  The appendix is distended up to 12 mm and fails to opacify with contrast.  No free intraperitoneal air or fluid.  No  lymphadenopathy.  There is scattered atherosclerotic calcification of the aorta and its branches. No aneurysmal dilatation.  Thin-walled bladder.  Unremarkable CT appearance to the uterus.  No adnexal mass.  Small amount of free fluid within the pelvis.  Mild stranding of the mesorectal fat.  Postoperative changes of the right femur.  Osteopenia.  Multilevel degenerative changes. Fatty atrophy of the paraspinal musculature.  IMPRESSION: Colonic wall thickening, most pronounced proximally.  In keeping with a nonspecific colitis with infectious, inflammatory, and ischemic considerations.  Pseudomembranous colitis is on the differential.  The appendix is distended up to 12 mm.  This may be secondary to the above described process; however, appendicitis or mucocele is not excluded.  Bilateral pleural effusions with associated consolidations; atelectasis versus pneumonia.  Moderate hiatal hernia.  Dilated main pancreatic duct.  No obstructing lesion identified by CT.  Can be further evaluated with ERCP or MRCP.  Discussed via telephone with [name], NP covering the patient (pager [phone]) at 04:30 a.m. on 03/26/2012   Original Report Authenticated By: Waneta Martins, M.D.     Medications: Scheduled Meds:    . Chlorhexidine Gluconate Cloth  6 each Topical Q0600  . cholestyramine light  4 g Oral BID  . citalopram  20 mg Oral Daily  . famotidine  20 mg Oral BID  . ketoconazole  1 application Topical Weekly  . vancomycin  500 mg Oral Q6H   And  . metronidazole  500 mg Intravenous Q8H  . mupirocin ointment  1 application Nasal BID  . saccharomyces boulardii  250 mg Oral BID  . sodium chloride  500 mL Intravenous Once  . sucralfate  1 g Oral TID WC & HS   Continuous Infusions:    Assessment/Plan: Principal Problem:  *C. difficile colitis: severe with possible pseudomembranous colitis: 1 episode of diarrhea today - Continue Flagyl IV (started on 8/27),  vancomycin oral x 14 days, discussed with  ID -Will give cholestyramine today, continue florastor, no surgery, low fiber diet    Active Problems:  UTI (lower urinary tract infection) - Urine culture shows only 9000 colonies, with insignificant growth, DC'ed ciprofloxacin in light of ongoing C. difficile colitis  Cerebral palsy; at baseline   H/O: GI bleed - Continue Pepcid twice a day. No PPI until C. difficile colitis has resolved.  Hypokalemia: Replaced  DVT Prophylaxis: SCDs  Code Status: Full   Disposition: Hold DC today, plan in AM hopefully if no diarrhea   LOS: 4 days   RAI,RIPUDEEP M.D. [facility] 03/29/2012, 3:21 PM Pager: [phone]  If 7PM-7AM, please contact night-coverage [email or web address]  Password [email or web address]

## 2012-03-29 NOTE — Progress Notes (Signed)
Clinical Social Work: Coverage for 4N  Patient to dc to SNF today, however due to some progressive medical issues, will hold dc. Patient has a bed at [facility] and will dc on 03/30/2012. Will not return to [facility] at DC, but wants [facility].  Patient aware of hold of DC and plan is for DC summary and med list to be faxed to facility before 4pm today. Family will sign admission paperwork this afternoon around 2pm. DC will be for Saturday 03/30/2012.  Will leave report for weekend CSW.  Ashley Jacobs, MSW LCSW [phone]

## 2012-03-29 NOTE — Discharge Summary (Signed)
Physician Discharge Summary  Patient ID: [name] MRN: [id number] DOB/AGE: [id number] y.o.  Admit date: 03/25/2012 Discharge date: 03/29/2012  Primary Care Physician:  Oneal Grout, MD  Discharge Diagnoses:    .Diarrhea .UTI (lower urinary tract infection) .Cerebral palsy .C. difficile colitis Hypokalemia likely secondary to diarrhea replaced  Consults:  General surgery, Dr Corliss Skains  Discharge Medications: Medication List  As of 03/29/2012  3:04 PM   STOP taking these medications         pantoprazole 40 MG tablet      polyethylene glycol packet      potassium chloride 10 MEQ tablet      promethazine 25 MG suppository         TAKE these medications         acetaminophen 325 MG tablet   Commonly known as: TYLENOL   Take 2 tablets (650 mg total) by mouth every 6 (six) hours as needed (or Fever >/= 101).      butalbital-acetaminophen-caffeine 50-325-40 MG per tablet   Commonly known as: FIORICET, ESGIC   Take 1 tablet by mouth 2 (two) times daily as needed. headache      carboxymethylcellulose 0.5 % Soln   Commonly known as: REFRESH PLUS   Place 1 drop into both eyes 3 (three) times daily as needed.      citalopram 20 MG tablet   Commonly known as: CELEXA   Take 20 mg by mouth daily.      famotidine 20 MG tablet   Commonly known as: PEPCID   Take 1 tablet (20 mg total) by mouth 2 (two) times daily.      ketoconazole 2 % shampoo   Commonly known as: NIZORAL   Apply 1 application topically once a week. For dry itchy scalp      saccharomyces boulardii 250 MG capsule   Commonly known as: FLORASTOR   Take 1 capsule (250 mg total) by mouth 2 (two) times daily.      sucralfate 1 GM/10ML suspension   Commonly known as: CARAFATE   Take 1 g by mouth 4 (four) times daily -  with meals and at bedtime.      triamcinolone cream 0.1 %   Commonly known as: KENALOG   Apply 1 application topically 2 (two) times daily as needed. For dermatitis areas      vancomycin  50 mg/mL oral solution   Commonly known as: VANCOCIN   Take 10 mLs (500 mg total) by mouth every 6 (six) hours. For 14 days, stop date on 04/14/12             Brief H and P: For complete details please refer to admission H and P, but in brief patient is a 76 year old female with history of cerebral palsy, hyperlipidemia, constipation who was recently admitted for upper GI bleed secondary to esophageal ulcer has been experiencing diarrhea over the last one week. Initially the diarrhea was associated nausea and vomiting. Denies any abdominal pain though did have some subjective feeling of fever and chills. Denies any blood in the diarrhea. At the nursing home patient had stool for C. difficile which was negative. Patient was found to have leukocytosis. UA examined at the nursing home was negative. Since patient has persistent diarrhea patient was transferred to ER for further workup. In the ER workup shows patient has UTI.   Hospital Course:  C. difficile colitis: Considered severe with possible pseudomembranous colitis, however patient had no abdominal tenderness or leukocytosis, fevers or  chills. Patient had presented with crampy diarrhea over the last week and had a recent hospitalization. CT of the abdomen and pelvis was obtained which showed colonic wall thickening, most pronounced proximally possible infectious/inflammatory or ischemic colitis or pseudomembranous colitis is also on differential. Appendix was distended up to 12 MM, hence general surgery was consulted Per their recommendations, doubt this was ischemic colitis as patient had no abdominal tenderness. Patient had no clinical signs of appendicitis with normal WBC, no abdominal tenderness. Patient was found to have positive C-Diff which explains her symptoms of diarrhea and CT findings. Ciprofloxacin was discontinued and patient was continued on Flagyl and oral vancomycin was added. She is to continue her vancomycin for 14 days per ID  recommendations, and I discussed in detail with Dr. Algis Liming on the phone. Patient is currently tolerating low fiber diet and should continue until the diarrhea has completely resolved.  UTI (lower urinary tract infection)  - Urine culture shows only 9000 colonies, with insignificant growth, DC'ed ciprofloxacin in light of ongoing C. difficile colitis.   Cerebral palsy; at baseline   H/O: GI bleed, patient was placed on Pepcid. No PPI until C. difficile colitis has resolved.    Day of Discharge BP 133/71  Pulse 74  Temp 98.2 F (36.8 C) (Oral)  Resp 18  Ht 4\' 10"  (1.473 m)  Wt 54.4 kg (119 lb 14.9 oz)  BMI 25.07 kg/m2  SpO2 94%  Physical Exam: General: Alert and awake oriented, not in any acute distress. HEENT: anicteric sclera, pupils reactive to light and accommodation CVS: S1-S2 clear no murmur rubs or gallops Chest: clear to auscultation bilaterally, no wheezing rales or rhonchi Abdomen: soft nontender, nondistended, normal bowel sounds, no organomegaly Extremities: no cyanosis, clubbing or edema noted bilaterally    The results of significant diagnostics from this hospitalization (including imaging, microbiology, ancillary and laboratory) are listed below for reference.    LAB RESULTS: Basic Metabolic Panel:  Lab 03/29/12 4098 03/27/12 0648  NA 138 141  K 3.2* 3.4*  CL 103 107  CO2 24 24  GLUCOSE 120* 80  BUN <3* <3*  CREATININE 0.47* 0.42*  CALCIUM 8.9 8.7  MG -- --  PHOS -- --   Liver Function Tests:  Lab 03/26/12 0749 03/25/12 2230  AST 14 21  ALT 11 14  ALKPHOS 102 103  BILITOT 0.2* 0.3  PROT 5.3* 5.5*  ALBUMIN 2.2* 2.3*    Lab 03/25/12 2230  LIPASE 32  AMYLASE --   CBC:  Lab 03/27/12 0648 03/26/12 0846 03/25/12 1716  WBC 7.1 8.6 --  NEUTROABS -- -- 5.3  HGB 11.6* 11.9* --  HCT 34.7* 35.0* --  MCV 84.8 -- --  PLT 341 331 --    Significant Diagnostic Studies:  Ct Abdomen Pelvis W Contrast  03/26/2012  *RADIOLOGY REPORT*  Clinical  Data: Nausea, vomiting, diarrhea.  CT ABDOMEN AND PELVIS WITH CONTRAST  Technique:  Multidetector CT imaging of the abdomen and pelvis was performed following the standard protocol during bolus administration of intravenous contrast.  Contrast: 1 OMNIPAQUE IOHEXOL 300 MG/ML  SOLN, OMNIPAQUE IOHEXOL 300 MG/ML  SOLN  Comparison: None.  Findings: Bilateral pleural effusions with associated consolidations; atelectasis versus infiltrate. The heart size is upper normal.  Moderate hiatal hernia.  Unremarkable liver.  Distended gallbladder.  No radiodense gallstones.  No biliary duct dilatation.  Mild prominence of the main pancreatic duct.  Otherwise homogeneous pancreatic enhancement.  Unremarkable spleen and adrenal glands.  Symmetric renal enhancement.  No  hydronephrosis or hydroureter.  Colonic wall thickening, most pronounced involving the transverse, ascending colon, and cecum.  The appendix is distended up to 12 mm and fails to opacify with contrast.  No free intraperitoneal air or fluid.  No lymphadenopathy.  There is scattered atherosclerotic calcification of the aorta and its branches. No aneurysmal dilatation.  Thin-walled bladder.  Unremarkable CT appearance to the uterus.  No adnexal mass.  Small amount of free fluid within the pelvis.  Mild stranding of the mesorectal fat.  Postoperative changes of the right femur.  Osteopenia.  Multilevel degenerative changes. Fatty atrophy of the paraspinal musculature.  IMPRESSION: Colonic wall thickening, most pronounced proximally.  In keeping with a nonspecific colitis with infectious, inflammatory, and ischemic considerations.  Pseudomembranous colitis is on the differential.  The appendix is distended up to 12 mm.  This may be secondary to the above described process; however, appendicitis or mucocele is not excluded.  Bilateral pleural effusions with associated consolidations; atelectasis versus pneumonia.  Moderate hiatal hernia.  Dilated main pancreatic duct.   No obstructing lesion identified by CT.  Can be further evaluated with ERCP or MRCP.  Discussed via telephone with [name], NP covering the patient (pager [phone]) at 04:30 a.m. on 03/26/2012   Original Report Authenticated By: Waneta Martins, M.D.      Disposition and Follow-up: Discharge Orders    Future Orders Please Complete By Expires   Increase activity slowly      Discharge instructions      Comments:   Discharge diet: low fiber until diarrhea resolves completely       DISPOSITION: Skilled nursing Saturday  DIET: Low fiber diet until her aerosols completely ACTIVITY: As tolerated   DISCHARGE FOLLOW-UP Follow-up Information    Follow up with Oneal Grout, MD. Schedule an appointment as soon as possible for a visit in 10 days. (for hospital follow-up)    Contact information:   [address] [facility] [place] [address] [phone]          Time spent on Discharge: 45 minutes  Signed:   Khamille Beynon M.D. [facility] 03/29/2012, 3:04 PM Pager: [phone]

## 2012-03-30 DIAGNOSIS — G589 Mononeuropathy, unspecified: Secondary | ICD-10-CM | POA: Diagnosis not present

## 2012-03-30 DIAGNOSIS — F4321 Adjustment disorder with depressed mood: Secondary | ICD-10-CM | POA: Diagnosis not present

## 2012-03-30 DIAGNOSIS — D239 Other benign neoplasm of skin, unspecified: Secondary | ICD-10-CM | POA: Diagnosis not present

## 2012-03-30 DIAGNOSIS — K2210 Ulcer of esophagus without bleeding: Secondary | ICD-10-CM | POA: Diagnosis not present

## 2012-03-30 DIAGNOSIS — A0472 Enterocolitis due to Clostridium difficile, not specified as recurrent: Secondary | ICD-10-CM | POA: Diagnosis not present

## 2012-03-30 DIAGNOSIS — I70209 Unspecified atherosclerosis of native arteries of extremities, unspecified extremity: Secondary | ICD-10-CM | POA: Diagnosis not present

## 2012-03-30 DIAGNOSIS — B351 Tinea unguium: Secondary | ICD-10-CM | POA: Diagnosis not present

## 2012-03-30 DIAGNOSIS — R059 Cough, unspecified: Secondary | ICD-10-CM | POA: Diagnosis not present

## 2012-03-30 DIAGNOSIS — F329 Major depressive disorder, single episode, unspecified: Secondary | ICD-10-CM | POA: Diagnosis not present

## 2012-03-30 DIAGNOSIS — M6281 Muscle weakness (generalized): Secondary | ICD-10-CM | POA: Diagnosis not present

## 2012-03-30 DIAGNOSIS — M2010 Hallux valgus (acquired), unspecified foot: Secondary | ICD-10-CM | POA: Diagnosis not present

## 2012-03-30 DIAGNOSIS — Z961 Presence of intraocular lens: Secondary | ICD-10-CM | POA: Diagnosis not present

## 2012-03-30 DIAGNOSIS — R262 Difficulty in walking, not elsewhere classified: Secondary | ICD-10-CM | POA: Diagnosis not present

## 2012-03-30 DIAGNOSIS — R109 Unspecified abdominal pain: Secondary | ICD-10-CM | POA: Diagnosis not present

## 2012-03-30 DIAGNOSIS — E039 Hypothyroidism, unspecified: Secondary | ICD-10-CM | POA: Diagnosis not present

## 2012-03-30 DIAGNOSIS — H3530 Unspecified macular degeneration: Secondary | ICD-10-CM | POA: Diagnosis not present

## 2012-03-30 DIAGNOSIS — L570 Actinic keratosis: Secondary | ICD-10-CM | POA: Diagnosis not present

## 2012-03-30 DIAGNOSIS — R279 Unspecified lack of coordination: Secondary | ICD-10-CM | POA: Diagnosis not present

## 2012-03-30 DIAGNOSIS — M62838 Other muscle spasm: Secondary | ICD-10-CM | POA: Diagnosis not present

## 2012-03-30 DIAGNOSIS — R112 Nausea with vomiting, unspecified: Secondary | ICD-10-CM | POA: Diagnosis not present

## 2012-03-30 DIAGNOSIS — K5289 Other specified noninfective gastroenteritis and colitis: Secondary | ICD-10-CM | POA: Diagnosis not present

## 2012-03-30 DIAGNOSIS — N390 Urinary tract infection, site not specified: Secondary | ICD-10-CM | POA: Diagnosis not present

## 2012-03-30 DIAGNOSIS — D649 Anemia, unspecified: Secondary | ICD-10-CM | POA: Diagnosis not present

## 2012-03-30 DIAGNOSIS — G809 Cerebral palsy, unspecified: Secondary | ICD-10-CM | POA: Diagnosis not present

## 2012-03-30 DIAGNOSIS — R509 Fever, unspecified: Secondary | ICD-10-CM | POA: Diagnosis not present

## 2012-03-30 DIAGNOSIS — C4492 Squamous cell carcinoma of skin, unspecified: Secondary | ICD-10-CM | POA: Diagnosis not present

## 2012-03-30 DIAGNOSIS — J811 Chronic pulmonary edema: Secondary | ICD-10-CM | POA: Diagnosis not present

## 2012-03-30 DIAGNOSIS — M25669 Stiffness of unspecified knee, not elsewhere classified: Secondary | ICD-10-CM | POA: Diagnosis not present

## 2012-03-30 DIAGNOSIS — J111 Influenza due to unidentified influenza virus with other respiratory manifestations: Secondary | ICD-10-CM | POA: Diagnosis not present

## 2012-03-30 DIAGNOSIS — R293 Abnormal posture: Secondary | ICD-10-CM | POA: Diagnosis not present

## 2012-03-30 DIAGNOSIS — Z5189 Encounter for other specified aftercare: Secondary | ICD-10-CM | POA: Diagnosis not present

## 2012-03-30 DIAGNOSIS — K219 Gastro-esophageal reflux disease without esophagitis: Secondary | ICD-10-CM | POA: Diagnosis not present

## 2012-03-30 DIAGNOSIS — M25569 Pain in unspecified knee: Secondary | ICD-10-CM | POA: Diagnosis not present

## 2012-03-30 DIAGNOSIS — Z8719 Personal history of other diseases of the digestive system: Secondary | ICD-10-CM | POA: Diagnosis not present

## 2012-03-30 DIAGNOSIS — E559 Vitamin D deficiency, unspecified: Secondary | ICD-10-CM | POA: Diagnosis not present

## 2012-03-30 DIAGNOSIS — R197 Diarrhea, unspecified: Secondary | ICD-10-CM | POA: Diagnosis not present

## 2012-03-30 DIAGNOSIS — E876 Hypokalemia: Secondary | ICD-10-CM | POA: Diagnosis not present

## 2012-03-30 DIAGNOSIS — N360 Urethral fistula: Secondary | ICD-10-CM | POA: Diagnosis not present

## 2012-03-30 DIAGNOSIS — E785 Hyperlipidemia, unspecified: Secondary | ICD-10-CM | POA: Diagnosis not present

## 2012-03-30 DIAGNOSIS — K922 Gastrointestinal hemorrhage, unspecified: Secondary | ICD-10-CM | POA: Diagnosis not present

## 2012-03-30 DIAGNOSIS — J069 Acute upper respiratory infection, unspecified: Secondary | ICD-10-CM | POA: Diagnosis not present

## 2012-03-30 DIAGNOSIS — M79609 Pain in unspecified limb: Secondary | ICD-10-CM | POA: Diagnosis not present

## 2012-03-30 DIAGNOSIS — I10 Essential (primary) hypertension: Secondary | ICD-10-CM | POA: Diagnosis not present

## 2012-03-30 DIAGNOSIS — R6889 Other general symptoms and signs: Secondary | ICD-10-CM | POA: Diagnosis not present

## 2012-03-30 DIAGNOSIS — M21619 Bunion of unspecified foot: Secondary | ICD-10-CM | POA: Diagnosis not present

## 2012-03-30 LAB — BASIC METABOLIC PANEL
Calcium: 8.8 mg/dL (ref 8.4–10.5)
Creatinine, Ser: 0.5 mg/dL (ref 0.50–1.10)
GFR calc Af Amer: >90 mL/min (ref >90)
GFR calc non Af Amer: >90 mL/min (ref >90)
Sodium: 138 mEq/L (ref 135–145)

## 2012-03-30 NOTE — Progress Notes (Signed)
Patient ID: [name]  female  ZOX:[id number]    DOB: 07-21-1936    DOA: 03/25/2012  PCP: Oneal Grout, MD  Subjective: Diarrhea resolved, formed stool today. no abdominal pain, no nausea vomiting. No fever or chills. Tolerating solids.   Objective: Weight change:   Intake/Output Summary (Last 24 hours) at 03/30/12 0927 Last data filed at 03/30/12 0817  Gross per 24 hour  Intake    480 ml  Output      0 ml  Net    480 ml   Blood pressure 151/89, pulse 72, temperature 98.3 F (36.8 C), temperature source Oral, resp. rate 18, height 4\' 10"  (1.473 m), weight 54.4 kg (119 lb 14.9 oz), SpO2 90.00%.  Physical Exam: General: Alert and awake, not in any acute distress. HEENT: anicteric sclera, pupils reactive to light and accommodation, EOMI CVS: S1-S2 clear, no murmur rubs or gallops Chest: clear to auscultation bilaterally, no wheezing, rales or rhonchi Abdomen: soft nontender, nondistended, normal bowel sounds, no organomegaly Extremities: no cyanosis, clubbing or edema noted bilaterally   Lab Results: Basic Metabolic Panel:  Lab 03/30/12 8119 03/29/12 0856  NA 138 138  K 3.4* 3.2*  CL 106 103  CO2 24 24  GLUCOSE 99 120*  BUN 3* <3*  CREATININE 0.50 0.47*  CALCIUM 8.8 8.9  MG -- --  PHOS -- --   Liver Function Tests:  Lab 03/26/12 0749 03/25/12 2230  AST 14 21  ALT 11 14  ALKPHOS 102 103  BILITOT 0.2* 0.3  PROT 5.3* 5.5*  ALBUMIN 2.2* 2.3*    Lab 03/25/12 2230  LIPASE 32  AMYLASE --   CBC:  Lab 03/27/12 0648 03/26/12 0846 03/25/12 1716  WBC 7.1 8.6 --  NEUTROABS -- -- 5.3  HGB 11.6* 11.9* --  HCT 34.7* 35.0* --  MCV 84.8 83.5 --  PLT 341 331 --      Micro Results: Recent Results (from the past 240 hour(s))  CLOSTRIDIUM DIFFICILE BY PCR     Status: Abnormal   Collection Time   03/25/12 11:33 PM      Component Value Range Status Comment   C difficile by pcr POSITIVE (*) NEGATIVE Final   STOOL CULTURE     Status: Normal   Collection Time   03/25/12 11:33 PM      Component Value Range Status Comment   Specimen Description STOOL   Final    Special Requests NONE   Final    Culture     Final    Value: NO SALMONELLA, SHIGELLA, CAMPYLOBACTER, YERSINIA, OR E.COLI 0157:H7 ISOLATED   Report Status 03/29/2012 FINAL   Final   URINE CULTURE     Status: Normal   Collection Time   03/26/12 12:46 AM      Component Value Range Status Comment   Specimen Description URINE, RANDOM   Final    Special Requests NONE   Final    Culture  Setup Time 03/26/2012 08:52   Final    Colony Count 9,000 COLONIES/ML   Final    Culture INSIGNIFICANT GROWTH   Final    Report Status 03/27/2012 FINAL   Final   MRSA PCR SCREENING     Status: Abnormal   Collection Time   03/26/12  1:18 AM      Component Value Range Status Comment   MRSA by PCR POSITIVE (*) NEGATIVE Final     Studies/Results: Ct Abdomen Pelvis W Contrast  03/26/2012  *RADIOLOGY REPORT*  Clinical Data: Nausea,  vomiting, diarrhea.  CT ABDOMEN AND PELVIS WITH CONTRAST  Technique:  Multidetector CT imaging of the abdomen and pelvis was performed following the standard protocol during bolus administration of intravenous contrast.  Contrast: 1 OMNIPAQUE IOHEXOL 300 MG/ML  SOLN, OMNIPAQUE IOHEXOL 300 MG/ML  SOLN  Comparison: None.  Findings: Bilateral pleural effusions with associated consolidations; atelectasis versus infiltrate. The heart size is upper normal.  Moderate hiatal hernia.  Unremarkable liver.  Distended gallbladder.  No radiodense gallstones.  No biliary duct dilatation.  Mild prominence of the main pancreatic duct.  Otherwise homogeneous pancreatic enhancement.  Unremarkable spleen and adrenal glands.  Symmetric renal enhancement.  No hydronephrosis or hydroureter.  Colonic wall thickening, most pronounced involving the transverse, ascending colon, and cecum.  The appendix is distended up to 12 mm and fails to opacify with contrast.  No free intraperitoneal air or fluid.  No  lymphadenopathy.  There is scattered atherosclerotic calcification of the aorta and its branches. No aneurysmal dilatation.  Thin-walled bladder.  Unremarkable CT appearance to the uterus.  No adnexal mass.  Small amount of free fluid within the pelvis.  Mild stranding of the mesorectal fat.  Postoperative changes of the right femur.  Osteopenia.  Multilevel degenerative changes. Fatty atrophy of the paraspinal musculature.  IMPRESSION: Colonic wall thickening, most pronounced proximally.  In keeping with a nonspecific colitis with infectious, inflammatory, and ischemic considerations.  Pseudomembranous colitis is on the differential.  The appendix is distended up to 12 mm.  This may be secondary to the above described process; however, appendicitis or mucocele is not excluded.  Bilateral pleural effusions with associated consolidations; atelectasis versus pneumonia.  Moderate hiatal hernia.  Dilated main pancreatic duct.  No obstructing lesion identified by CT.  Can be further evaluated with ERCP or MRCP.  Discussed via telephone with [name], NP covering the patient (pager [phone]) at 04:30 a.m. on 03/26/2012   Original Report Authenticated By: Waneta Martins, M.D.     Medications: Scheduled Meds:    . Chlorhexidine Gluconate Cloth  6 each Topical Q0600  . cholestyramine light  4 g Oral BID  . citalopram  20 mg Oral Daily  . famotidine  20 mg Oral BID  . ketoconazole  1 application Topical Weekly  . vancomycin  500 mg Oral Q6H   And  . metronidazole  500 mg Intravenous Q8H  . mupirocin ointment  1 application Nasal BID  . potassium chloride  40 mEq Oral Once  . saccharomyces boulardii  250 mg Oral BID  . sodium chloride  500 mL Intravenous Once  . sucralfate  1 g Oral TID WC & HS   Continuous Infusions:    Assessment/Plan: Principal Problem:  *C. difficile colitis: severe with possible pseudomembranous colitis: diarrhea resolved - DC flagyl IV, cont  vancomycin oral x 14 days,  discussed with ID - continue florastor, no surgery, low fiber diet    Active Problems:  UTI (lower urinary tract infection) - Urine culture shows only 9000 colonies, with insignificant growth, DC'ed ciprofloxacin in light of ongoing C. difficile colitis  Cerebral palsy; at baseline   H/O: GI bleed - Continue Pepcid twice a day. No PPI until C. difficile colitis has resolved.  Hypokalemia: Replaced  DVT Prophylaxis: SCDs  Code Status: Full   Disposition: DC summary done, patient stable for DC today to SNF   LOS: 5 days   Platon Arocho M.D. [facility] 03/30/2012, 9:27 AM Pager: [phone]  If 7PM-7AM, please contact night-coverage [email or web address] Password [email or web address]

## 2012-03-30 NOTE — Progress Notes (Signed)
Pt seen asleep in bed, RR even and regular.

## 2012-03-30 NOTE — Progress Notes (Signed)
Pt to transfer to [facility] today via [facility]. Pt, pt's family Cassandr Cederberg), and SNF aware of d/c. RN to call report to SNF. D/C packet complete with chart copy and signed FL2. CSW signing off.  Dellie Burns, MSW, LCSWA [phone] (Weekends 8:00am-4:30pm)

## 2012-03-30 NOTE — Progress Notes (Signed)
Patient discharged to SNF via EMS transport.  Assisted patient to get dressed in personal clothing.  Patient left unit with discharge packet.  Patient left unit in stable condition.  Family requested to come pick up personal belongings instead of being sent via EMS.  Family arrived on unit to pick up patients belongings, including braces, teds and shoes.

## 2012-04-03 DIAGNOSIS — A0472 Enterocolitis due to Clostridium difficile, not specified as recurrent: Secondary | ICD-10-CM | POA: Diagnosis not present

## 2012-04-03 DIAGNOSIS — G809 Cerebral palsy, unspecified: Secondary | ICD-10-CM | POA: Diagnosis not present

## 2012-04-03 DIAGNOSIS — N390 Urinary tract infection, site not specified: Secondary | ICD-10-CM | POA: Diagnosis not present

## 2012-04-22 DIAGNOSIS — K219 Gastro-esophageal reflux disease without esophagitis: Secondary | ICD-10-CM | POA: Diagnosis not present

## 2012-04-22 DIAGNOSIS — K2210 Ulcer of esophagus without bleeding: Secondary | ICD-10-CM | POA: Diagnosis not present

## 2012-04-24 DIAGNOSIS — Z8719 Personal history of other diseases of the digestive system: Secondary | ICD-10-CM | POA: Diagnosis not present

## 2012-04-24 DIAGNOSIS — N390 Urinary tract infection, site not specified: Secondary | ICD-10-CM | POA: Diagnosis not present

## 2012-04-24 DIAGNOSIS — K5289 Other specified noninfective gastroenteritis and colitis: Secondary | ICD-10-CM | POA: Diagnosis not present

## 2012-04-24 DIAGNOSIS — A0472 Enterocolitis due to Clostridium difficile, not specified as recurrent: Secondary | ICD-10-CM | POA: Diagnosis not present

## 2012-04-30 DIAGNOSIS — B351 Tinea unguium: Secondary | ICD-10-CM | POA: Diagnosis not present

## 2012-04-30 DIAGNOSIS — H3530 Unspecified macular degeneration: Secondary | ICD-10-CM | POA: Diagnosis not present

## 2012-04-30 DIAGNOSIS — M25569 Pain in unspecified knee: Secondary | ICD-10-CM | POA: Diagnosis not present

## 2012-04-30 DIAGNOSIS — R197 Diarrhea, unspecified: Secondary | ICD-10-CM | POA: Diagnosis not present

## 2012-04-30 DIAGNOSIS — I70209 Unspecified atherosclerosis of native arteries of extremities, unspecified extremity: Secondary | ICD-10-CM | POA: Diagnosis not present

## 2012-04-30 DIAGNOSIS — M6281 Muscle weakness (generalized): Secondary | ICD-10-CM | POA: Diagnosis not present

## 2012-04-30 DIAGNOSIS — R262 Difficulty in walking, not elsewhere classified: Secondary | ICD-10-CM | POA: Diagnosis not present

## 2012-04-30 DIAGNOSIS — M21619 Bunion of unspecified foot: Secondary | ICD-10-CM | POA: Diagnosis not present

## 2012-04-30 DIAGNOSIS — D239 Other benign neoplasm of skin, unspecified: Secondary | ICD-10-CM | POA: Diagnosis not present

## 2012-04-30 DIAGNOSIS — M2010 Hallux valgus (acquired), unspecified foot: Secondary | ICD-10-CM | POA: Diagnosis not present

## 2012-04-30 DIAGNOSIS — K219 Gastro-esophageal reflux disease without esophagitis: Secondary | ICD-10-CM | POA: Diagnosis not present

## 2012-04-30 DIAGNOSIS — M25669 Stiffness of unspecified knee, not elsewhere classified: Secondary | ICD-10-CM | POA: Diagnosis not present

## 2012-04-30 DIAGNOSIS — L570 Actinic keratosis: Secondary | ICD-10-CM | POA: Diagnosis not present

## 2012-04-30 DIAGNOSIS — M62838 Other muscle spasm: Secondary | ICD-10-CM | POA: Diagnosis not present

## 2012-04-30 DIAGNOSIS — G809 Cerebral palsy, unspecified: Secondary | ICD-10-CM | POA: Diagnosis not present

## 2012-04-30 DIAGNOSIS — R112 Nausea with vomiting, unspecified: Secondary | ICD-10-CM | POA: Diagnosis not present

## 2012-04-30 DIAGNOSIS — N390 Urinary tract infection, site not specified: Secondary | ICD-10-CM | POA: Diagnosis not present

## 2012-04-30 DIAGNOSIS — J069 Acute upper respiratory infection, unspecified: Secondary | ICD-10-CM | POA: Diagnosis not present

## 2012-04-30 DIAGNOSIS — D649 Anemia, unspecified: Secondary | ICD-10-CM | POA: Diagnosis not present

## 2012-04-30 DIAGNOSIS — Z5189 Encounter for other specified aftercare: Secondary | ICD-10-CM | POA: Diagnosis not present

## 2012-04-30 DIAGNOSIS — J811 Chronic pulmonary edema: Secondary | ICD-10-CM | POA: Diagnosis not present

## 2012-04-30 DIAGNOSIS — E559 Vitamin D deficiency, unspecified: Secondary | ICD-10-CM | POA: Diagnosis not present

## 2012-04-30 DIAGNOSIS — E039 Hypothyroidism, unspecified: Secondary | ICD-10-CM | POA: Diagnosis not present

## 2012-04-30 DIAGNOSIS — I10 Essential (primary) hypertension: Secondary | ICD-10-CM | POA: Diagnosis not present

## 2012-04-30 DIAGNOSIS — R6889 Other general symptoms and signs: Secondary | ICD-10-CM | POA: Diagnosis not present

## 2012-04-30 DIAGNOSIS — R279 Unspecified lack of coordination: Secondary | ICD-10-CM | POA: Diagnosis not present

## 2012-04-30 DIAGNOSIS — E785 Hyperlipidemia, unspecified: Secondary | ICD-10-CM | POA: Diagnosis not present

## 2012-04-30 DIAGNOSIS — E876 Hypokalemia: Secondary | ICD-10-CM | POA: Diagnosis not present

## 2012-04-30 DIAGNOSIS — R293 Abnormal posture: Secondary | ICD-10-CM | POA: Diagnosis not present

## 2012-04-30 DIAGNOSIS — M79609 Pain in unspecified limb: Secondary | ICD-10-CM | POA: Diagnosis not present

## 2012-04-30 DIAGNOSIS — R05 Cough: Secondary | ICD-10-CM | POA: Diagnosis not present

## 2012-04-30 DIAGNOSIS — R509 Fever, unspecified: Secondary | ICD-10-CM | POA: Diagnosis not present

## 2012-04-30 DIAGNOSIS — A0472 Enterocolitis due to Clostridium difficile, not specified as recurrent: Secondary | ICD-10-CM | POA: Diagnosis not present

## 2012-04-30 DIAGNOSIS — K5289 Other specified noninfective gastroenteritis and colitis: Secondary | ICD-10-CM | POA: Diagnosis not present

## 2012-04-30 DIAGNOSIS — Z961 Presence of intraocular lens: Secondary | ICD-10-CM | POA: Diagnosis not present

## 2012-04-30 DIAGNOSIS — C4492 Squamous cell carcinoma of skin, unspecified: Secondary | ICD-10-CM | POA: Diagnosis not present

## 2012-05-06 DIAGNOSIS — R112 Nausea with vomiting, unspecified: Secondary | ICD-10-CM | POA: Diagnosis not present

## 2012-05-06 DIAGNOSIS — R197 Diarrhea, unspecified: Secondary | ICD-10-CM | POA: Diagnosis not present

## 2012-05-06 DIAGNOSIS — R509 Fever, unspecified: Secondary | ICD-10-CM | POA: Diagnosis not present

## 2012-05-07 DIAGNOSIS — R112 Nausea with vomiting, unspecified: Secondary | ICD-10-CM | POA: Diagnosis not present

## 2012-05-07 DIAGNOSIS — R197 Diarrhea, unspecified: Secondary | ICD-10-CM | POA: Diagnosis not present

## 2012-05-08 DIAGNOSIS — R197 Diarrhea, unspecified: Secondary | ICD-10-CM | POA: Diagnosis not present

## 2012-05-08 DIAGNOSIS — R112 Nausea with vomiting, unspecified: Secondary | ICD-10-CM | POA: Diagnosis not present

## 2012-05-08 DIAGNOSIS — E876 Hypokalemia: Secondary | ICD-10-CM | POA: Diagnosis not present

## 2012-05-09 DIAGNOSIS — E876 Hypokalemia: Secondary | ICD-10-CM | POA: Diagnosis not present

## 2012-05-09 DIAGNOSIS — R112 Nausea with vomiting, unspecified: Secondary | ICD-10-CM | POA: Diagnosis not present

## 2012-05-09 DIAGNOSIS — R509 Fever, unspecified: Secondary | ICD-10-CM | POA: Diagnosis not present

## 2012-05-09 DIAGNOSIS — R197 Diarrhea, unspecified: Secondary | ICD-10-CM | POA: Diagnosis not present

## 2012-05-10 DIAGNOSIS — R197 Diarrhea, unspecified: Secondary | ICD-10-CM | POA: Diagnosis not present

## 2012-05-10 DIAGNOSIS — E876 Hypokalemia: Secondary | ICD-10-CM | POA: Diagnosis not present

## 2012-05-10 DIAGNOSIS — A0472 Enterocolitis due to Clostridium difficile, not specified as recurrent: Secondary | ICD-10-CM | POA: Diagnosis not present

## 2012-05-10 DIAGNOSIS — R112 Nausea with vomiting, unspecified: Secondary | ICD-10-CM | POA: Diagnosis not present

## 2012-05-14 DIAGNOSIS — A0472 Enterocolitis due to Clostridium difficile, not specified as recurrent: Secondary | ICD-10-CM | POA: Diagnosis not present

## 2012-05-14 DIAGNOSIS — E876 Hypokalemia: Secondary | ICD-10-CM | POA: Diagnosis not present

## 2012-05-14 DIAGNOSIS — N390 Urinary tract infection, site not specified: Secondary | ICD-10-CM | POA: Diagnosis not present

## 2012-08-07 DIAGNOSIS — K5289 Other specified noninfective gastroenteritis and colitis: Secondary | ICD-10-CM | POA: Diagnosis not present

## 2012-08-08 DIAGNOSIS — K5289 Other specified noninfective gastroenteritis and colitis: Secondary | ICD-10-CM | POA: Diagnosis not present

## 2012-09-09 DIAGNOSIS — B351 Tinea unguium: Secondary | ICD-10-CM | POA: Diagnosis not present

## 2012-09-09 DIAGNOSIS — M79609 Pain in unspecified limb: Secondary | ICD-10-CM | POA: Diagnosis not present

## 2012-09-09 DIAGNOSIS — M21619 Bunion of unspecified foot: Secondary | ICD-10-CM | POA: Diagnosis not present

## 2012-09-18 DIAGNOSIS — J069 Acute upper respiratory infection, unspecified: Secondary | ICD-10-CM | POA: Diagnosis not present

## 2012-09-25 DIAGNOSIS — J069 Acute upper respiratory infection, unspecified: Secondary | ICD-10-CM | POA: Diagnosis not present

## 2012-10-07 DIAGNOSIS — E876 Hypokalemia: Secondary | ICD-10-CM | POA: Diagnosis not present

## 2012-10-07 DIAGNOSIS — G809 Cerebral palsy, unspecified: Secondary | ICD-10-CM | POA: Diagnosis not present

## 2012-10-07 DIAGNOSIS — A0472 Enterocolitis due to Clostridium difficile, not specified as recurrent: Secondary | ICD-10-CM | POA: Diagnosis not present

## 2012-12-02 DIAGNOSIS — E876 Hypokalemia: Secondary | ICD-10-CM | POA: Diagnosis not present

## 2012-12-02 DIAGNOSIS — G809 Cerebral palsy, unspecified: Secondary | ICD-10-CM | POA: Diagnosis not present

## 2012-12-02 DIAGNOSIS — A0472 Enterocolitis due to Clostridium difficile, not specified as recurrent: Secondary | ICD-10-CM | POA: Diagnosis not present

## 2013-01-22 DIAGNOSIS — Z961 Presence of intraocular lens: Secondary | ICD-10-CM | POA: Diagnosis not present

## 2013-01-22 DIAGNOSIS — H3530 Unspecified macular degeneration: Secondary | ICD-10-CM | POA: Diagnosis not present

## 2013-02-13 DIAGNOSIS — J069 Acute upper respiratory infection, unspecified: Secondary | ICD-10-CM | POA: Diagnosis not present

## 2013-02-27 DIAGNOSIS — G809 Cerebral palsy, unspecified: Secondary | ICD-10-CM | POA: Diagnosis not present

## 2013-02-27 DIAGNOSIS — M25569 Pain in unspecified knee: Secondary | ICD-10-CM | POA: Diagnosis not present

## 2013-02-27 DIAGNOSIS — E876 Hypokalemia: Secondary | ICD-10-CM | POA: Diagnosis not present

## 2013-03-10 DIAGNOSIS — C4492 Squamous cell carcinoma of skin, unspecified: Secondary | ICD-10-CM | POA: Diagnosis not present

## 2013-03-10 DIAGNOSIS — D239 Other benign neoplasm of skin, unspecified: Secondary | ICD-10-CM | POA: Diagnosis not present

## 2013-03-10 DIAGNOSIS — L570 Actinic keratosis: Secondary | ICD-10-CM | POA: Diagnosis not present

## 2013-04-09 DIAGNOSIS — A0472 Enterocolitis due to Clostridium difficile, not specified as recurrent: Secondary | ICD-10-CM | POA: Diagnosis not present

## 2013-04-09 DIAGNOSIS — E876 Hypokalemia: Secondary | ICD-10-CM | POA: Diagnosis not present

## 2013-04-09 DIAGNOSIS — J069 Acute upper respiratory infection, unspecified: Secondary | ICD-10-CM | POA: Diagnosis not present

## 2013-04-11 DIAGNOSIS — E876 Hypokalemia: Secondary | ICD-10-CM | POA: Diagnosis not present

## 2013-04-11 DIAGNOSIS — J069 Acute upper respiratory infection, unspecified: Secondary | ICD-10-CM | POA: Diagnosis not present

## 2013-04-11 DIAGNOSIS — A0472 Enterocolitis due to Clostridium difficile, not specified as recurrent: Secondary | ICD-10-CM | POA: Diagnosis not present

## 2013-04-15 DIAGNOSIS — K219 Gastro-esophageal reflux disease without esophagitis: Secondary | ICD-10-CM | POA: Diagnosis not present

## 2013-06-12 DIAGNOSIS — J069 Acute upper respiratory infection, unspecified: Secondary | ICD-10-CM | POA: Diagnosis not present

## 2013-06-12 DIAGNOSIS — A0472 Enterocolitis due to Clostridium difficile, not specified as recurrent: Secondary | ICD-10-CM | POA: Diagnosis not present

## 2013-06-12 DIAGNOSIS — E876 Hypokalemia: Secondary | ICD-10-CM | POA: Diagnosis not present

## 2013-08-01 DIAGNOSIS — J811 Chronic pulmonary edema: Secondary | ICD-10-CM | POA: Diagnosis not present

## 2013-08-07 DIAGNOSIS — M25569 Pain in unspecified knee: Secondary | ICD-10-CM | POA: Diagnosis not present

## 2013-08-07 DIAGNOSIS — F329 Major depressive disorder, single episode, unspecified: Secondary | ICD-10-CM | POA: Diagnosis not present

## 2013-08-07 DIAGNOSIS — J111 Influenza due to unidentified influenza virus with other respiratory manifestations: Secondary | ICD-10-CM | POA: Diagnosis not present

## 2013-08-07 DIAGNOSIS — F3289 Other specified depressive episodes: Secondary | ICD-10-CM | POA: Diagnosis not present

## 2013-08-07 DIAGNOSIS — K219 Gastro-esophageal reflux disease without esophagitis: Secondary | ICD-10-CM | POA: Diagnosis not present

## 2013-08-14 DIAGNOSIS — F4321 Adjustment disorder with depressed mood: Secondary | ICD-10-CM | POA: Diagnosis not present

## 2013-08-20 DIAGNOSIS — J069 Acute upper respiratory infection, unspecified: Secondary | ICD-10-CM | POA: Diagnosis not present

## 2013-08-20 DIAGNOSIS — A0472 Enterocolitis due to Clostridium difficile, not specified as recurrent: Secondary | ICD-10-CM | POA: Diagnosis not present

## 2013-08-20 DIAGNOSIS — E876 Hypokalemia: Secondary | ICD-10-CM | POA: Diagnosis not present

## 2013-09-02 DIAGNOSIS — R109 Unspecified abdominal pain: Secondary | ICD-10-CM | POA: Diagnosis not present

## 2013-09-02 DIAGNOSIS — A0472 Enterocolitis due to Clostridium difficile, not specified as recurrent: Secondary | ICD-10-CM | POA: Diagnosis not present

## 2013-09-02 DIAGNOSIS — E876 Hypokalemia: Secondary | ICD-10-CM | POA: Diagnosis not present

## 2013-09-08 DIAGNOSIS — E876 Hypokalemia: Secondary | ICD-10-CM | POA: Diagnosis not present

## 2013-09-08 DIAGNOSIS — M6281 Muscle weakness (generalized): Secondary | ICD-10-CM | POA: Diagnosis not present

## 2013-09-08 DIAGNOSIS — G809 Cerebral palsy, unspecified: Secondary | ICD-10-CM | POA: Diagnosis not present

## 2013-09-11 DIAGNOSIS — D649 Anemia, unspecified: Secondary | ICD-10-CM | POA: Diagnosis not present

## 2013-09-11 DIAGNOSIS — E785 Hyperlipidemia, unspecified: Secondary | ICD-10-CM | POA: Diagnosis not present

## 2013-09-11 DIAGNOSIS — I10 Essential (primary) hypertension: Secondary | ICD-10-CM | POA: Diagnosis not present

## 2013-10-15 DIAGNOSIS — B351 Tinea unguium: Secondary | ICD-10-CM | POA: Diagnosis not present

## 2013-10-15 DIAGNOSIS — M79609 Pain in unspecified limb: Secondary | ICD-10-CM | POA: Diagnosis not present

## 2013-10-15 DIAGNOSIS — G589 Mononeuropathy, unspecified: Secondary | ICD-10-CM | POA: Diagnosis not present

## 2013-10-15 DIAGNOSIS — I70209 Unspecified atherosclerosis of native arteries of extremities, unspecified extremity: Secondary | ICD-10-CM | POA: Diagnosis not present

## 2013-10-16 DIAGNOSIS — F4321 Adjustment disorder with depressed mood: Secondary | ICD-10-CM | POA: Diagnosis not present

## 2013-10-31 DIAGNOSIS — F3289 Other specified depressive episodes: Secondary | ICD-10-CM | POA: Diagnosis not present

## 2013-10-31 DIAGNOSIS — M25569 Pain in unspecified knee: Secondary | ICD-10-CM | POA: Diagnosis not present

## 2013-10-31 DIAGNOSIS — K219 Gastro-esophageal reflux disease without esophagitis: Secondary | ICD-10-CM | POA: Diagnosis not present

## 2013-10-31 DIAGNOSIS — F329 Major depressive disorder, single episode, unspecified: Secondary | ICD-10-CM | POA: Diagnosis not present

## 2013-11-28 DIAGNOSIS — D649 Anemia, unspecified: Secondary | ICD-10-CM | POA: Diagnosis not present

## 2014-01-06 DIAGNOSIS — N390 Urinary tract infection, site not specified: Secondary | ICD-10-CM | POA: Diagnosis not present

## 2014-01-12 DIAGNOSIS — N390 Urinary tract infection, site not specified: Secondary | ICD-10-CM | POA: Diagnosis not present

## 2014-01-12 DIAGNOSIS — IMO0002 Reserved for concepts with insufficient information to code with codable children: Secondary | ICD-10-CM | POA: Diagnosis not present

## 2014-01-12 DIAGNOSIS — M6281 Muscle weakness (generalized): Secondary | ICD-10-CM | POA: Diagnosis not present

## 2014-01-16 DIAGNOSIS — M79609 Pain in unspecified limb: Secondary | ICD-10-CM | POA: Diagnosis not present

## 2014-01-16 DIAGNOSIS — M2010 Hallux valgus (acquired), unspecified foot: Secondary | ICD-10-CM | POA: Diagnosis not present

## 2014-01-16 DIAGNOSIS — D649 Anemia, unspecified: Secondary | ICD-10-CM | POA: Diagnosis not present

## 2014-01-16 DIAGNOSIS — M6281 Muscle weakness (generalized): Secondary | ICD-10-CM | POA: Diagnosis not present

## 2014-01-16 DIAGNOSIS — N390 Urinary tract infection, site not specified: Secondary | ICD-10-CM | POA: Diagnosis not present

## 2014-01-16 DIAGNOSIS — IMO0002 Reserved for concepts with insufficient information to code with codable children: Secondary | ICD-10-CM | POA: Diagnosis not present

## 2014-01-16 DIAGNOSIS — B351 Tinea unguium: Secondary | ICD-10-CM | POA: Diagnosis not present

## 2014-01-20 DIAGNOSIS — G809 Cerebral palsy, unspecified: Secondary | ICD-10-CM | POA: Diagnosis not present

## 2014-01-20 DIAGNOSIS — N390 Urinary tract infection, site not specified: Secondary | ICD-10-CM | POA: Diagnosis not present

## 2014-01-20 DIAGNOSIS — R21 Rash and other nonspecific skin eruption: Secondary | ICD-10-CM | POA: Diagnosis not present

## 2014-01-20 DIAGNOSIS — M6281 Muscle weakness (generalized): Secondary | ICD-10-CM | POA: Diagnosis not present

## 2014-03-02 DIAGNOSIS — H52209 Unspecified astigmatism, unspecified eye: Secondary | ICD-10-CM | POA: Diagnosis not present

## 2014-03-02 DIAGNOSIS — H3530 Unspecified macular degeneration: Secondary | ICD-10-CM | POA: Diagnosis not present

## 2014-03-02 DIAGNOSIS — H524 Presbyopia: Secondary | ICD-10-CM | POA: Diagnosis not present

## 2014-03-02 DIAGNOSIS — Z961 Presence of intraocular lens: Secondary | ICD-10-CM | POA: Diagnosis not present

## 2014-03-05 DIAGNOSIS — E785 Hyperlipidemia, unspecified: Secondary | ICD-10-CM | POA: Diagnosis not present

## 2014-03-05 DIAGNOSIS — E559 Vitamin D deficiency, unspecified: Secondary | ICD-10-CM | POA: Diagnosis not present

## 2014-03-05 DIAGNOSIS — E039 Hypothyroidism, unspecified: Secondary | ICD-10-CM | POA: Diagnosis not present

## 2014-03-05 DIAGNOSIS — I10 Essential (primary) hypertension: Secondary | ICD-10-CM | POA: Diagnosis not present

## 2014-03-05 DIAGNOSIS — D649 Anemia, unspecified: Secondary | ICD-10-CM | POA: Diagnosis not present

## 2014-03-09 DIAGNOSIS — M25569 Pain in unspecified knee: Secondary | ICD-10-CM | POA: Diagnosis not present

## 2014-03-09 DIAGNOSIS — G809 Cerebral palsy, unspecified: Secondary | ICD-10-CM | POA: Diagnosis not present

## 2014-03-09 DIAGNOSIS — M2559 Pain in other specified joint: Secondary | ICD-10-CM | POA: Diagnosis not present

## 2014-03-10 DIAGNOSIS — M24569 Contracture, unspecified knee: Secondary | ICD-10-CM | POA: Diagnosis not present

## 2014-03-10 DIAGNOSIS — G809 Cerebral palsy, unspecified: Secondary | ICD-10-CM | POA: Diagnosis not present

## 2014-03-10 DIAGNOSIS — M25669 Stiffness of unspecified knee, not elsewhere classified: Secondary | ICD-10-CM | POA: Diagnosis not present

## 2014-03-11 DIAGNOSIS — G809 Cerebral palsy, unspecified: Secondary | ICD-10-CM | POA: Diagnosis not present

## 2014-03-11 DIAGNOSIS — M25669 Stiffness of unspecified knee, not elsewhere classified: Secondary | ICD-10-CM | POA: Diagnosis not present

## 2014-03-11 DIAGNOSIS — M24569 Contracture, unspecified knee: Secondary | ICD-10-CM | POA: Diagnosis not present

## 2014-03-12 DIAGNOSIS — G809 Cerebral palsy, unspecified: Secondary | ICD-10-CM | POA: Diagnosis not present

## 2014-03-12 DIAGNOSIS — M25669 Stiffness of unspecified knee, not elsewhere classified: Secondary | ICD-10-CM | POA: Diagnosis not present

## 2014-03-12 DIAGNOSIS — M24569 Contracture, unspecified knee: Secondary | ICD-10-CM | POA: Diagnosis not present

## 2014-03-13 DIAGNOSIS — G809 Cerebral palsy, unspecified: Secondary | ICD-10-CM | POA: Diagnosis not present

## 2014-03-13 DIAGNOSIS — M24569 Contracture, unspecified knee: Secondary | ICD-10-CM | POA: Diagnosis not present

## 2014-03-13 DIAGNOSIS — M25669 Stiffness of unspecified knee, not elsewhere classified: Secondary | ICD-10-CM | POA: Diagnosis not present

## 2014-03-16 DIAGNOSIS — M25669 Stiffness of unspecified knee, not elsewhere classified: Secondary | ICD-10-CM | POA: Diagnosis not present

## 2014-03-16 DIAGNOSIS — M24569 Contracture, unspecified knee: Secondary | ICD-10-CM | POA: Diagnosis not present

## 2014-03-16 DIAGNOSIS — G809 Cerebral palsy, unspecified: Secondary | ICD-10-CM | POA: Diagnosis not present

## 2014-03-17 DIAGNOSIS — M25669 Stiffness of unspecified knee, not elsewhere classified: Secondary | ICD-10-CM | POA: Diagnosis not present

## 2014-03-17 DIAGNOSIS — G809 Cerebral palsy, unspecified: Secondary | ICD-10-CM | POA: Diagnosis not present

## 2014-03-17 DIAGNOSIS — M24569 Contracture, unspecified knee: Secondary | ICD-10-CM | POA: Diagnosis not present

## 2014-03-18 DIAGNOSIS — G809 Cerebral palsy, unspecified: Secondary | ICD-10-CM | POA: Diagnosis not present

## 2014-03-18 DIAGNOSIS — M25669 Stiffness of unspecified knee, not elsewhere classified: Secondary | ICD-10-CM | POA: Diagnosis not present

## 2014-03-18 DIAGNOSIS — M24569 Contracture, unspecified knee: Secondary | ICD-10-CM | POA: Diagnosis not present

## 2014-03-19 DIAGNOSIS — G809 Cerebral palsy, unspecified: Secondary | ICD-10-CM | POA: Diagnosis not present

## 2014-03-19 DIAGNOSIS — M25669 Stiffness of unspecified knee, not elsewhere classified: Secondary | ICD-10-CM | POA: Diagnosis not present

## 2014-03-19 DIAGNOSIS — M24569 Contracture, unspecified knee: Secondary | ICD-10-CM | POA: Diagnosis not present

## 2014-03-20 DIAGNOSIS — M25669 Stiffness of unspecified knee, not elsewhere classified: Secondary | ICD-10-CM | POA: Diagnosis not present

## 2014-03-20 DIAGNOSIS — G809 Cerebral palsy, unspecified: Secondary | ICD-10-CM | POA: Diagnosis not present

## 2014-03-20 DIAGNOSIS — M24569 Contracture, unspecified knee: Secondary | ICD-10-CM | POA: Diagnosis not present

## 2014-03-23 DIAGNOSIS — G809 Cerebral palsy, unspecified: Secondary | ICD-10-CM | POA: Diagnosis not present

## 2014-03-23 DIAGNOSIS — M25669 Stiffness of unspecified knee, not elsewhere classified: Secondary | ICD-10-CM | POA: Diagnosis not present

## 2014-03-23 DIAGNOSIS — M24569 Contracture, unspecified knee: Secondary | ICD-10-CM | POA: Diagnosis not present

## 2014-03-24 DIAGNOSIS — G809 Cerebral palsy, unspecified: Secondary | ICD-10-CM | POA: Diagnosis not present

## 2014-03-24 DIAGNOSIS — M24569 Contracture, unspecified knee: Secondary | ICD-10-CM | POA: Diagnosis not present

## 2014-03-24 DIAGNOSIS — M25669 Stiffness of unspecified knee, not elsewhere classified: Secondary | ICD-10-CM | POA: Diagnosis not present

## 2014-03-25 DIAGNOSIS — G809 Cerebral palsy, unspecified: Secondary | ICD-10-CM | POA: Diagnosis not present

## 2014-03-25 DIAGNOSIS — M24569 Contracture, unspecified knee: Secondary | ICD-10-CM | POA: Diagnosis not present

## 2014-03-25 DIAGNOSIS — M25669 Stiffness of unspecified knee, not elsewhere classified: Secondary | ICD-10-CM | POA: Diagnosis not present

## 2014-03-26 DIAGNOSIS — M25669 Stiffness of unspecified knee, not elsewhere classified: Secondary | ICD-10-CM | POA: Diagnosis not present

## 2014-03-26 DIAGNOSIS — G809 Cerebral palsy, unspecified: Secondary | ICD-10-CM | POA: Diagnosis not present

## 2014-03-26 DIAGNOSIS — M24569 Contracture, unspecified knee: Secondary | ICD-10-CM | POA: Diagnosis not present

## 2014-03-27 DIAGNOSIS — G809 Cerebral palsy, unspecified: Secondary | ICD-10-CM | POA: Diagnosis not present

## 2014-03-27 DIAGNOSIS — M25669 Stiffness of unspecified knee, not elsewhere classified: Secondary | ICD-10-CM | POA: Diagnosis not present

## 2014-03-27 DIAGNOSIS — M24569 Contracture, unspecified knee: Secondary | ICD-10-CM | POA: Diagnosis not present

## 2014-03-29 DIAGNOSIS — M25569 Pain in unspecified knee: Secondary | ICD-10-CM | POA: Diagnosis not present

## 2014-03-30 DIAGNOSIS — M25669 Stiffness of unspecified knee, not elsewhere classified: Secondary | ICD-10-CM | POA: Diagnosis not present

## 2014-03-30 DIAGNOSIS — G809 Cerebral palsy, unspecified: Secondary | ICD-10-CM | POA: Diagnosis not present

## 2014-03-30 DIAGNOSIS — M24569 Contracture, unspecified knee: Secondary | ICD-10-CM | POA: Diagnosis not present

## 2014-03-30 DIAGNOSIS — IMO0001 Reserved for inherently not codable concepts without codable children: Secondary | ICD-10-CM | POA: Diagnosis not present

## 2014-03-31 DIAGNOSIS — M24569 Contracture, unspecified knee: Secondary | ICD-10-CM | POA: Diagnosis not present

## 2014-03-31 DIAGNOSIS — M25669 Stiffness of unspecified knee, not elsewhere classified: Secondary | ICD-10-CM | POA: Diagnosis not present

## 2014-03-31 DIAGNOSIS — G809 Cerebral palsy, unspecified: Secondary | ICD-10-CM | POA: Diagnosis not present

## 2014-04-01 DIAGNOSIS — IMO0002 Reserved for concepts with insufficient information to code with codable children: Secondary | ICD-10-CM | POA: Diagnosis not present

## 2014-04-01 DIAGNOSIS — M1710 Unilateral primary osteoarthritis, unspecified knee: Secondary | ICD-10-CM | POA: Diagnosis not present

## 2014-04-01 DIAGNOSIS — M25569 Pain in unspecified knee: Secondary | ICD-10-CM | POA: Diagnosis not present

## 2014-04-01 DIAGNOSIS — N390 Urinary tract infection, site not specified: Secondary | ICD-10-CM | POA: Diagnosis not present

## 2014-04-01 DIAGNOSIS — M25669 Stiffness of unspecified knee, not elsewhere classified: Secondary | ICD-10-CM | POA: Diagnosis not present

## 2014-04-01 DIAGNOSIS — M24569 Contracture, unspecified knee: Secondary | ICD-10-CM | POA: Diagnosis not present

## 2014-04-01 DIAGNOSIS — G809 Cerebral palsy, unspecified: Secondary | ICD-10-CM | POA: Diagnosis not present

## 2014-04-02 DIAGNOSIS — M24569 Contracture, unspecified knee: Secondary | ICD-10-CM | POA: Diagnosis not present

## 2014-04-02 DIAGNOSIS — M25669 Stiffness of unspecified knee, not elsewhere classified: Secondary | ICD-10-CM | POA: Diagnosis not present

## 2014-04-02 DIAGNOSIS — G809 Cerebral palsy, unspecified: Secondary | ICD-10-CM | POA: Diagnosis not present

## 2014-04-03 DIAGNOSIS — M24569 Contracture, unspecified knee: Secondary | ICD-10-CM | POA: Diagnosis not present

## 2014-04-03 DIAGNOSIS — M25669 Stiffness of unspecified knee, not elsewhere classified: Secondary | ICD-10-CM | POA: Diagnosis not present

## 2014-04-03 DIAGNOSIS — G809 Cerebral palsy, unspecified: Secondary | ICD-10-CM | POA: Diagnosis not present

## 2014-04-03 DIAGNOSIS — M25569 Pain in unspecified knee: Secondary | ICD-10-CM | POA: Diagnosis not present

## 2014-04-07 DIAGNOSIS — M24569 Contracture, unspecified knee: Secondary | ICD-10-CM | POA: Diagnosis not present

## 2014-04-07 DIAGNOSIS — M25669 Stiffness of unspecified knee, not elsewhere classified: Secondary | ICD-10-CM | POA: Diagnosis not present

## 2014-04-07 DIAGNOSIS — G809 Cerebral palsy, unspecified: Secondary | ICD-10-CM | POA: Diagnosis not present

## 2014-04-09 DIAGNOSIS — G809 Cerebral palsy, unspecified: Secondary | ICD-10-CM | POA: Diagnosis not present

## 2014-04-09 DIAGNOSIS — M25669 Stiffness of unspecified knee, not elsewhere classified: Secondary | ICD-10-CM | POA: Diagnosis not present

## 2014-04-09 DIAGNOSIS — M24569 Contracture, unspecified knee: Secondary | ICD-10-CM | POA: Diagnosis not present

## 2014-04-14 DIAGNOSIS — G809 Cerebral palsy, unspecified: Secondary | ICD-10-CM | POA: Diagnosis not present

## 2014-04-14 DIAGNOSIS — M25669 Stiffness of unspecified knee, not elsewhere classified: Secondary | ICD-10-CM | POA: Diagnosis not present

## 2014-04-14 DIAGNOSIS — M24569 Contracture, unspecified knee: Secondary | ICD-10-CM | POA: Diagnosis not present

## 2014-04-15 DIAGNOSIS — G809 Cerebral palsy, unspecified: Secondary | ICD-10-CM | POA: Diagnosis not present

## 2014-04-15 DIAGNOSIS — M25669 Stiffness of unspecified knee, not elsewhere classified: Secondary | ICD-10-CM | POA: Diagnosis not present

## 2014-04-15 DIAGNOSIS — M24569 Contracture, unspecified knee: Secondary | ICD-10-CM | POA: Diagnosis not present

## 2014-04-20 DIAGNOSIS — M1990 Unspecified osteoarthritis, unspecified site: Secondary | ICD-10-CM | POA: Diagnosis not present

## 2014-04-20 DIAGNOSIS — M25569 Pain in unspecified knee: Secondary | ICD-10-CM | POA: Diagnosis not present

## 2014-04-20 DIAGNOSIS — G809 Cerebral palsy, unspecified: Secondary | ICD-10-CM | POA: Diagnosis not present

## 2014-04-22 DIAGNOSIS — D649 Anemia, unspecified: Secondary | ICD-10-CM | POA: Diagnosis not present

## 2014-04-22 DIAGNOSIS — Z79899 Other long term (current) drug therapy: Secondary | ICD-10-CM | POA: Diagnosis not present

## 2014-04-22 DIAGNOSIS — R112 Nausea with vomiting, unspecified: Secondary | ICD-10-CM | POA: Diagnosis not present

## 2014-04-22 DIAGNOSIS — R52 Pain, unspecified: Secondary | ICD-10-CM | POA: Diagnosis not present

## 2014-04-24 DIAGNOSIS — B351 Tinea unguium: Secondary | ICD-10-CM | POA: Diagnosis not present

## 2014-04-24 DIAGNOSIS — M2010 Hallux valgus (acquired), unspecified foot: Secondary | ICD-10-CM | POA: Diagnosis not present

## 2014-04-24 DIAGNOSIS — M79609 Pain in unspecified limb: Secondary | ICD-10-CM | POA: Diagnosis not present

## 2014-04-24 DIAGNOSIS — D649 Anemia, unspecified: Secondary | ICD-10-CM | POA: Diagnosis not present

## 2014-04-27 DIAGNOSIS — M25569 Pain in unspecified knee: Secondary | ICD-10-CM | POA: Diagnosis not present

## 2014-05-04 ENCOUNTER — Encounter: Payer: Self-pay | Admitting: Neurology

## 2014-05-04 ENCOUNTER — Encounter (INDEPENDENT_AMBULATORY_CARE_PROVIDER_SITE_OTHER): Payer: Self-pay

## 2014-05-04 ENCOUNTER — Ambulatory Visit (INDEPENDENT_AMBULATORY_CARE_PROVIDER_SITE_OTHER): Payer: Medicare Other | Admitting: Neurology

## 2014-05-04 DIAGNOSIS — G8389 Other specified paralytic syndromes: Secondary | ICD-10-CM | POA: Diagnosis not present

## 2014-05-04 DIAGNOSIS — IMO0002 Reserved for concepts with insufficient information to code with codable children: Secondary | ICD-10-CM

## 2014-05-04 DIAGNOSIS — G809 Cerebral palsy, unspecified: Secondary | ICD-10-CM | POA: Diagnosis not present

## 2014-05-04 NOTE — Progress Notes (Signed)
PATIENT: [name] DOB: [birth year]  HISTORICAL  [name] is a 44 Caucasian female, accompanied by her cousin Darol Destine, who is her POA. Referred by [facility]  orthopedic surgeon Dr. Erlinda Hong for evaluation of bilateral lower extremity spasticity, potential benefit of Botox injection.  She was born with cerebral palsy, was able to ambulate with crutches, whackers when she was younger, has been wheelchair bound over the past for 5 years, since 2010, she used to live with her parents at home, now they both passed away, she has been in facilities since 2013  Now she is totally wheelchair-bound, need help, lifter, to transport her in and out of wheelchair, she denies swallowing difficulty, is able to feed herself, no bowel and bladder incontinence.  About 2 weeks ago, in September 2015, she developed bilateral lower extremity deep achy pain from knee down, muscle spasm, she was given Flexeril 10 mg As needed, which has been helpful, over the past few days, there was no lower extremity deep achy pain, muscle spasm anymore.  She does reported difficulty with abducting her legs, sometimes difficulty with personal hygiene, she has no bowel and bladder incontinence.  REVIEW OF SYSTEMS: Full 14 system review of systems performed and notable only for as above ALLERGIES: No Known Allergies  HOME MEDICATIONS: Current Outpatient Prescriptions on File Prior to Visit  Medication Sig Dispense Refill  . butalbital-acetaminophen-caffeine (FIORICET, ESGIC) 50-325-40 MG per tablet Take 1 tablet by mouth 2 (two) times daily as needed. headache      . carboxymethylcellulose (REFRESH PLUS) 0.5 % SOLN Place 1 drop into both eyes 3 (three) times daily as needed.      . citalopram (CELEXA) 20 MG tablet Take 20 mg by mouth daily.      Marland Kitchen ketoconazole (NIZORAL) 2 % shampoo Apply 1 application topically once a week. For dry itchy scalp      . sucralfate (CARAFATE) 1 GM/10ML suspension Take 1 g by mouth 4 (four)  times daily -  with meals and at bedtime.      . triamcinolone cream (KENALOG) 0.1 % Apply 1 application topically 2 (two) times daily as needed. For dermatitis areas      . famotidine (PEPCID) 20 MG tablet Take 1 tablet (20 mg total) by mouth 2 (two) times daily.       No current facility-administered medications on file prior to visit.    PAST MEDICAL HISTORY: Past Medical History  Diagnosis Date  . Constipation   . Cerebral palsy   . Hyperlipidemia     PAST SURGICAL HISTORY: Past Surgical History  Procedure Laterality Date  . Right hip surgery  April 1991  . Tonsillectomy    . Esophagogastroduodenoscopy  01/28/2012    Procedure: ESOPHAGOGASTRODUODENOSCOPY (EGD);  Surgeon: Lear Ng, MD;  Location: [facility] ENDOSCOPY;  Service: Endoscopy;  Laterality: N/A;    FAMILY HISTORY: History reviewed. No pertinent family history.  SOCIAL HISTORY:  History   Social History  . Marital Status: Single    Spouse Name: N/A    Number of Children: N/A  . Years of Education: N/A   Occupational History  . Not on file.   Social History Main Topics  . Smoking status: Never Smoker   . Smokeless tobacco: Never Used  . Alcohol Use: No  . Drug Use: No  . Sexual Activity: Not on file   Other Topics Concern  . Not on file   Social History Narrative  . No narrative on file  PHYSICAL EXAM   Filed Vitals:    Not recorded    Cannot calculate BMI with a height equal to zero.   Generalized: In no acute distress  Neck: Supple, no carotid bruits   Cardiac: Regular rate rhythm  Pulmonary: Clear to auscultation bilaterally  Musculoskeletal: No deformity  Neurological examination  Mentation: She slumped in a wheelchair, with severe spine scoliosis, alert, oriented, following instruction  Cranial nerve II-XII: Pupils were equal round reactive to light. Extraocular movements were full.  Visual field were full on confrontational test.Facial sensation and strength were  normal. Hearing was intact to finger rubbing bilaterally. Uvula tongue midline.  Head turning and shoulder shrug and were normal and symmetric.Tongue protrusion into cheek strength was normal.  Motor: She has relative free movement of bilateral upper extremity, there was no significant weakness, she only has trace movement of both lower extremity movement, involving proximal, and distal lower extremity. She has fixed bilateral knee contraction, stay at 95, she also has fixed bilateral hip adduction, I was not able to further abducting her hips, even with passive movements.  Sensory: Intact to fine touch, pinprick, preserved vibratory sensation, and proprioception at toes.  Coordination: Normal finger to nose bilaterally  Gait: Deferred  Deep tendon reflexes: Hypoactive and symmetric. plantar responses were flexor bilaterally.   DIAGNOSTIC DATA (LABS, IMAGING, TESTING) - I reviewed patient records, labs, notes, testing and imaging myself where available.  Lab Results  Component Value Date   WBC 7.1 03/27/2012   HGB 11.6* 03/27/2012   HCT 34.7* 03/27/2012   MCV 84.8 03/27/2012   PLT 341 03/27/2012      Component Value Date/Time   NA 138 03/30/2012 0600   K 3.4* 03/30/2012 0600   CL 106 03/30/2012 0600   CO2 24 03/30/2012 0600   GLUCOSE 99 03/30/2012 0600   BUN 3* 03/30/2012 0600   CREATININE 0.50 03/30/2012 0600   CALCIUM 8.8 03/30/2012 0600   PROT 5.3* 03/26/2012 0749   ALBUMIN 2.2* 03/26/2012 0749   AST 14 03/26/2012 0749   ALT 11 03/26/2012 0749   ALKPHOS 102 03/26/2012 0749   BILITOT 0.2* 03/26/2012 0749   GFRNONAA >90 03/30/2012 0600   GFRAA >90 03/30/2012 0600   ASSESSMENT AND PLAN  JAIONNA Cummings is a 78 y.o. female born with cerebral palsy, with fixed bilateral lower extremity knee flexion, hip adduction, she is now complaining of transient episode of bilateral lower extremity muscle spasm, deep achy pain, which has improved after taking Flexeril as needed  Continue Flexeril as  needed  I do not think she is a good candidate for Botox injection because of her fixed contraction of bilateral lower extremity,  She will return to clinic as needed    Marcial Pacas, M.D. Ph.D.  [facility] [address] [place], [place] [address] [phone]

## 2014-05-06 DIAGNOSIS — IMO0002 Reserved for concepts with insufficient information to code with codable children: Secondary | ICD-10-CM | POA: Insufficient documentation

## 2014-05-10 ENCOUNTER — Encounter (HOSPITAL_COMMUNITY)
Admission: EM | Disposition: A | Discharge status: Skilled Nursing Facility | Payer: Self-pay | Source: Home / Self Care | Attending: Internal Medicine

## 2014-05-10 ENCOUNTER — Emergency Department (HOSPITAL_COMMUNITY): Payer: Medicare Other

## 2014-05-10 ENCOUNTER — Inpatient Hospital Stay (HOSPITAL_COMMUNITY)
Admission: EM | Admit: 2014-05-10 | Discharge: 2014-05-12 | DRG: 392 | Disposition: A | Discharge status: Skilled Nursing Facility | Payer: Medicare Other | Attending: Internal Medicine | Admitting: Internal Medicine

## 2014-05-10 ENCOUNTER — Encounter (HOSPITAL_COMMUNITY): Payer: Self-pay | Admitting: Emergency Medicine

## 2014-05-10 DIAGNOSIS — R1033 Periumbilical pain: Secondary | ICD-10-CM | POA: Diagnosis not present

## 2014-05-10 DIAGNOSIS — K922 Gastrointestinal hemorrhage, unspecified: Secondary | ICD-10-CM

## 2014-05-10 DIAGNOSIS — K219 Gastro-esophageal reflux disease without esophagitis: Secondary | ICD-10-CM

## 2014-05-10 DIAGNOSIS — G809 Cerebral palsy, unspecified: Secondary | ICD-10-CM | POA: Diagnosis present

## 2014-05-10 DIAGNOSIS — K5900 Constipation, unspecified: Secondary | ICD-10-CM | POA: Diagnosis present

## 2014-05-10 DIAGNOSIS — Z79899 Other long term (current) drug therapy: Secondary | ICD-10-CM | POA: Diagnosis not present

## 2014-05-10 DIAGNOSIS — K920 Hematemesis: Secondary | ICD-10-CM | POA: Diagnosis not present

## 2014-05-10 DIAGNOSIS — K449 Diaphragmatic hernia without obstruction or gangrene: Secondary | ICD-10-CM | POA: Diagnosis not present

## 2014-05-10 DIAGNOSIS — Z8711 Personal history of peptic ulcer disease: Secondary | ICD-10-CM

## 2014-05-10 DIAGNOSIS — E785 Hyperlipidemia, unspecified: Secondary | ICD-10-CM | POA: Diagnosis present

## 2014-05-10 DIAGNOSIS — K210 Gastro-esophageal reflux disease with esophagitis: Principal | ICD-10-CM | POA: Diagnosis present

## 2014-05-10 DIAGNOSIS — R112 Nausea with vomiting, unspecified: Secondary | ICD-10-CM

## 2014-05-10 DIAGNOSIS — R109 Unspecified abdominal pain: Secondary | ICD-10-CM | POA: Diagnosis not present

## 2014-05-10 HISTORY — PX: ESOPHAGOGASTRODUODENOSCOPY: SHX5428

## 2014-05-10 LAB — LIPASE, BLOOD: LIPASE: 26 U/L (ref 11–59)

## 2014-05-10 LAB — CBC WITH DIFFERENTIAL/PLATELET
BASOS ABS: 0.1 10*3/uL (ref 0.0–0.1)
BASOS PCT: 1 % (ref 0–1)
EOS PCT: 1 % (ref 0–5)
Eosinophils Absolute: 0.1 10*3/uL (ref 0.0–0.7)
HCT: 40.6 % (ref 36.0–46.0)
Hemoglobin: 13.3 g/dL (ref 12.0–15.0)
LYMPHS PCT: 14 % (ref 12–46)
Lymphs Abs: 1.6 10*3/uL (ref 0.7–4.0)
MCH: 28.5 pg (ref 26.0–34.0)
MCHC: 32.8 g/dL (ref 30.0–36.0)
MCV: 86.9 fL (ref 78.0–100.0)
Monocytes Absolute: 1.4 10*3/uL — ABNORMAL HIGH (ref 0.1–1.0)
Monocytes Relative: 12 % (ref 3–12)
NEUTROS ABS: 8.8 10*3/uL — AB (ref 1.7–7.7)
Neutrophils Relative %: 74 % (ref 43–77)
PLATELETS: 307 10*3/uL (ref 150–400)
RBC: 4.67 MIL/uL (ref 3.87–5.11)
RDW: 15.3 % (ref 11.5–15.5)
WBC: 11.9 10*3/uL — AB (ref 4.0–10.5)

## 2014-05-10 LAB — HEMOGLOBIN AND HEMATOCRIT, BLOOD
HCT: 32.4 % — ABNORMAL LOW (ref 36.0–46.0)
HEMOGLOBIN: 10.6 g/dL — AB (ref 12.0–15.0)

## 2014-05-10 LAB — COMPREHENSIVE METABOLIC PANEL
ALBUMIN: 3.7 g/dL (ref 3.5–5.2)
ALT: 10 U/L (ref 0–35)
AST: 21 U/L (ref 0–37)
Alkaline Phosphatase: 78 U/L (ref 39–117)
Anion gap: 17 — ABNORMAL HIGH (ref 5–15)
BUN: 15 mg/dL (ref 6–23)
CALCIUM: 9.5 mg/dL (ref 8.4–10.5)
CHLORIDE: 99 meq/L (ref 96–112)
CO2: 22 meq/L (ref 19–32)
CREATININE: 0.62 mg/dL (ref 0.50–1.10)
GFR calc Af Amer: >90 mL/min (ref >90)
GFR, EST NON AFRICAN AMERICAN: 85 mL/min — AB (ref >90)
Glucose, Bld: 110 mg/dL — ABNORMAL HIGH (ref 70–99)
Potassium: 3.6 mEq/L — ABNORMAL LOW (ref 3.7–5.3)
SODIUM: 138 meq/L (ref 137–147)
Total Bilirubin: 0.3 mg/dL (ref 0.3–1.2)
Total Protein: 7.4 g/dL (ref 6.0–8.3)

## 2014-05-10 LAB — I-STAT CG4 LACTIC ACID, ED: LACTIC ACID, VENOUS: 1.37 mmol/L (ref 0.5–2.2)

## 2014-05-10 LAB — POC OCCULT BLOOD, ED: Fecal Occult Bld: POSITIVE — AB

## 2014-05-10 LAB — TYPE AND SCREEN
ABO/RH(D): O POS
ANTIBODY SCREEN: POSITIVE
DAT, IgG: NEGATIVE
PT AG TYPE: NEGATIVE

## 2014-05-10 SURGERY — EGD (ESOPHAGOGASTRODUODENOSCOPY)
Anesthesia: Moderate Sedation

## 2014-05-10 MED ORDER — FENTANYL CITRATE 0.05 MG/ML IJ SOLN
INTRAMUSCULAR | Status: AC
  Filled 2014-05-10: qty 2

## 2014-05-10 MED ORDER — BUTAMBEN-TETRACAINE-BENZOCAINE 2-2-14 % EX AERO
INHALATION_SPRAY | CUTANEOUS | Status: DC | PRN
  Administered 2014-05-10: 2 via TOPICAL

## 2014-05-10 MED ORDER — POTASSIUM CHLORIDE IN NACL 20-0.9 MEQ/L-% IV SOLN
INTRAVENOUS | Status: DC
  Administered 2014-05-10 – 2014-05-12 (×3): via INTRAVENOUS
  Filled 2014-05-10 (×4): qty 1000

## 2014-05-10 MED ORDER — PROCHLORPERAZINE MALEATE 5 MG PO TABS
5.0000 mg | ORAL_TABLET | Freq: Three times a day (TID) | ORAL | Status: DC
  Administered 2014-05-10 – 2014-05-12 (×5): 5 mg via ORAL
  Filled 2014-05-10 (×10): qty 1

## 2014-05-10 MED ORDER — HYDROCODONE-ACETAMINOPHEN 5-325 MG PO TABS
1.0000 | ORAL_TABLET | ORAL | Status: DC | PRN
  Administered 2014-05-10: 1 via ORAL
  Filled 2014-05-10: qty 1

## 2014-05-10 MED ORDER — POLYETHYLENE GLYCOL 3350 17 G PO PACK
17.0000 g | PACK | Freq: Every day | ORAL | Status: DC
  Administered 2014-05-10 – 2014-05-11 (×2): 17 g via ORAL
  Filled 2014-05-10 (×3): qty 1

## 2014-05-10 MED ORDER — DOCUSATE SODIUM 100 MG PO CAPS
100.0000 mg | ORAL_CAPSULE | Freq: Two times a day (BID) | ORAL | Status: DC
  Administered 2014-05-10 – 2014-05-12 (×4): 100 mg via ORAL
  Filled 2014-05-10 (×4): qty 1

## 2014-05-10 MED ORDER — SACCHAROMYCES BOULARDII 250 MG PO CAPS
250.0000 mg | ORAL_CAPSULE | Freq: Two times a day (BID) | ORAL | Status: DC
  Administered 2014-05-10 – 2014-05-12 (×4): 250 mg via ORAL
  Filled 2014-05-10 (×5): qty 1

## 2014-05-10 MED ORDER — FERROUS GLUCONATE 324 (38 FE) MG PO TABS
324.0000 mg | ORAL_TABLET | Freq: Every evening | ORAL | Status: DC
  Administered 2014-05-10: 324 mg via ORAL
  Filled 2014-05-10 (×3): qty 1

## 2014-05-10 MED ORDER — MIDAZOLAM HCL 5 MG/ML IJ SOLN
INTRAMUSCULAR | Status: AC
  Filled 2014-05-10: qty 2

## 2014-05-10 MED ORDER — DIPHENHYDRAMINE HCL 50 MG/ML IJ SOLN
INTRAMUSCULAR | Status: AC
  Filled 2014-05-10: qty 1

## 2014-05-10 MED ORDER — IOHEXOL 300 MG/ML  SOLN
100.0000 mL | Freq: Once | INTRAMUSCULAR | Status: AC | PRN
Start: 2014-05-10 — End: 2014-05-10
  Administered 2014-05-10: 80 mL via INTRAVENOUS

## 2014-05-10 MED ORDER — ACETAMINOPHEN 650 MG RE SUPP: 650.0000 mg | Freq: Four times a day (QID) | RECTAL | Status: DC | PRN

## 2014-05-10 MED ORDER — MIDAZOLAM HCL 10 MG/2ML IJ SOLN
INTRAMUSCULAR | Status: DC | PRN
  Administered 2014-05-10 (×3): 1 mg via INTRAVENOUS

## 2014-05-10 MED ORDER — CITALOPRAM HYDROBROMIDE 40 MG PO TABS
40.0000 mg | ORAL_TABLET | Freq: Every day | ORAL | Status: DC
  Administered 2014-05-10 – 2014-05-12 (×3): 40 mg via ORAL
  Filled 2014-05-10 (×3): qty 1

## 2014-05-10 MED ORDER — PANTOPRAZOLE SODIUM 40 MG IV SOLR
40.0000 mg | Freq: Two times a day (BID) | INTRAVENOUS | Status: DC
  Administered 2014-05-10 – 2014-05-12 (×5): 40 mg via INTRAVENOUS
  Filled 2014-05-10 (×7): qty 40

## 2014-05-10 MED ORDER — ENOXAPARIN SODIUM 40 MG/0.4ML ~~LOC~~ SOLN
40.0000 mg | SUBCUTANEOUS | Status: DC
  Administered 2014-05-10 – 2014-05-12 (×3): 40 mg via SUBCUTANEOUS
  Filled 2014-05-10 (×3): qty 0.4

## 2014-05-10 MED ORDER — LORATADINE 10 MG PO TABS
10.0000 mg | ORAL_TABLET | Freq: Every day | ORAL | Status: DC
  Administered 2014-05-10 – 2014-05-12 (×3): 10 mg via ORAL
  Filled 2014-05-10 (×3): qty 1

## 2014-05-10 MED ORDER — ACETAMINOPHEN 325 MG PO TABS
650.0000 mg | ORAL_TABLET | Freq: Four times a day (QID) | ORAL | Status: DC | PRN
  Filled 2014-05-10: qty 2

## 2014-05-10 MED ORDER — CYCLOBENZAPRINE HCL 10 MG PO TABS: 10.0000 mg | ORAL_TABLET | Freq: Three times a day (TID) | ORAL | Status: DC | PRN

## 2014-05-10 MED ORDER — INFLUENZA VAC SPLIT QUAD 0.5 ML IM SUSY
0.5000 mL | PREFILLED_SYRINGE | INTRAMUSCULAR | Status: AC
  Administered 2014-05-11: 0.5 mL via INTRAMUSCULAR
  Filled 2014-05-10: qty 0.5

## 2014-05-10 MED ORDER — IOHEXOL 300 MG/ML  SOLN
25.0000 mL | INTRAMUSCULAR | Status: DC
  Administered 2014-05-10: 25 mL via ORAL

## 2014-05-10 MED ORDER — VITAMIN D3 25 MCG (1000 UNIT) PO TABS
2000.0000 [IU] | ORAL_TABLET | Freq: Every day | ORAL | Status: DC
  Administered 2014-05-10 – 2014-05-12 (×3): 2000 [IU] via ORAL
  Filled 2014-05-10 (×3): qty 2

## 2014-05-10 MED ORDER — TRAMADOL HCL 50 MG PO TABS
50.0000 mg | ORAL_TABLET | Freq: Three times a day (TID) | ORAL | Status: DC
  Administered 2014-05-10 – 2014-05-12 (×6): 50 mg via ORAL
  Filled 2014-05-10 (×6): qty 1

## 2014-05-10 MED ORDER — ONDANSETRON HCL 4 MG/2ML IJ SOLN
4.0000 mg | Freq: Four times a day (QID) | INTRAMUSCULAR | Status: DC | PRN
  Administered 2014-05-10: 4 mg via INTRAVENOUS
  Filled 2014-05-10: qty 2

## 2014-05-10 NOTE — ED Notes (Signed)
Per EMS, pt from [facility].  Staff reports pt has had n/v x 2 days, and tonight emesis was coffee ground consistency.  Pt c/o of lower abdominal pain but has no pain currently.  Pt denies diarrhea, pain, lightheadedness, dizziness, or falls.  Pt has a DNR.  20g in L wrist and 4mg  of zofran given by EMS.  Pt NAD at this time.

## 2014-05-10 NOTE — Interval H&P Note (Signed)
History and Physical Interval Note:  05/10/2014 12:17 PM  [name]  has presented today for surgery, with the diagnosis of coffee ground emesis  The various methods of treatment have been discussed with the patient and family. After consideration of risks, benefits and other options for treatment, the patient has consented to  Procedure(s): ESOPHAGOGASTRODUODENOSCOPY (EGD) (N/A) as a surgical intervention .  The patient's history has been reviewed, patient examined, no change in status, stable for surgery.  I have reviewed the patient's chart and labs.  Questions were answered to the patient's satisfaction.     Eleri Ruben C

## 2014-05-10 NOTE — H&P (Signed)
PCP:   Reynold Bowen, MD   Chief Complaint:  "Coffee ground" emesis, can't eat without pain  HPI: Patient is a 78 year old female resident of [facility] x 2 years who was taken to the ER after I was called at 3am with the patient having heme positive "coffee ground" emesis.  She is not on Coumadin, antricoagulants or antiplatelets, and per her med list was taking Pepcid.  Workup at the ER revealed her to be heme positive, a somewhat elevated BUN for her as hers is normally 3 or less, and a CT of her abdomen which revealed a "massive" hiatal hernia with much of her stomach contained in the chest and some fluid in the colon possibly related to enteritis, although she does not have lower GI symptoms.  Of note, her last EGD was per Dr. Michail Sermon 2 years ago.  She relates 2 days worth of symptoms and a chronic nausea sensation as well.  Legs cramping a bit from the gurney.  Nursing has noted considerable dark stool output and another episode of emesis in the ER which appeared coffee ground as well.  Review of Systems:  Review of Systems - [place] reviewed and are negative for acute change except as noted in the HPI.   Past Medical History: Past Medical History  Diagnosis Date  . Constipation   . Cerebral palsy   . Hyperlipidemia    Past Surgical History  Procedure Laterality Date  . Right hip surgery    . Tonsillectomy    . Esophagogastroduodenoscopy  01/28/2012    Procedure: ESOPHAGOGASTRODUODENOSCOPY (EGD);  Surgeon: Lear Ng, MD;  Location: [facility] ENDOSCOPY;  Service: Endoscopy;  Laterality: N/A;    Medications: Prior to Admission medications   Medication Sig Start Date End Date Taking? Authorizing Provider  cholecalciferol (VITAMIN D) 1000 UNITS tablet Take 2,000 Units by mouth daily.   Yes Historical Provider, MD  citalopram (CELEXA) 40 MG tablet Take 40 mg by mouth daily.   Yes Historical Provider, MD  Cranberry 450 MG CAPS Take 1 capsule by mouth 2 (two)  times daily.   Yes Historical Provider, MD  cyclobenzaprine (FLEXERIL) 10 MG tablet Take 10 mg by mouth 3 (three) times daily as needed for muscle spasms.   Yes Historical Provider, MD  diclofenac sodium (VOLTAREN) 1 % GEL Apply 2 g topically 3 (three) times daily. To each knee   Yes Historical Provider, MD  docusate sodium (COLACE) 100 MG capsule Take 100 mg by mouth 2 (two) times daily.   Yes Historical Provider, MD  famotidine (PEPCID) 20 MG tablet Take 20 mg by mouth 2 (two) times daily.   Yes Historical Provider, MD  ferrous gluconate (FERGON) 324 MG tablet Take 324 mg by mouth every evening.   Yes Historical Provider, MD  HYDROcodone-acetaminophen (NORCO/VICODIN) 5-325 MG per tablet Take 1 tablet by mouth every 4 (four) hours as needed for moderate pain.   Yes Historical Provider, MD  loratadine (CLARITIN) 10 MG tablet Take 10 mg by mouth daily.   Yes Historical Provider, MD  polyethylene glycol (MIRALAX / GLYCOLAX) packet Take 17 g by mouth at bedtime.   Yes Historical Provider, MD  prochlorperazine (COMPAZINE) 5 MG tablet Take 5 mg by mouth 3 (three) times daily.   Yes Historical Provider, MD  saccharomyces boulardii (FLORASTOR) 250 MG capsule Take 250 mg by mouth 2 (two) times daily.   Yes Historical Provider, MD  traMADol (ULTRAM) 50 MG tablet Take 50 mg by mouth 3 (  three) times daily.   Yes Historical Provider, MD    Allergies:  No Known Allergies  Social History:  reports that she has never smoked. She has never used smokeless tobacco. She reports that she does not drink alcohol or use illicit drugs.  Family History: History reviewed. No pertinent family history.  Physical Exam: Filed Vitals:   05/10/14 0645 05/10/14 0700 05/10/14 0803 05/10/14 0906  BP: 135/114 147/81 131/57 130/75  Pulse: 96 106 96 96  Temp:      TempSrc:      Resp: 16 24 19 20   SpO2: 97% 97% 96%    General appearance: alert, cooperative and appears stated age Head: Normocephalic, without obvious  abnormality, atraumatic Eyes: conjunctivae/corneas clear. PERRL, EOM's intact.  Nose: Nares normal. Septum midline. Mucosa normal. No drainage or sinus tenderness. Throat: lips, mucosa, and tongue normal; teeth and gums normal Neck: no adenopathy, no carotid bruit, no JVD and thyroid not enlarged, symmetric, no tenderness/mass/nodules Resp: clear to auscultation bilaterally Cardio: regular rate and rhythm, S1, S2 normal, no murmur, click, rub or gallop GI: soft, mild mid and upper epigastric tenderness; bowel sounds normal; no masses,  no organomegaly Contractures c/w Cerebral Palsy Pulses: 2+ and symmetric Lymph nodes: Cervical adenopathy: no cervical lymphadenopathy Neurologic: Alert and oriented X 3, Cerebral palsy changes as per prior notation, nonacute   Labs on Admission:   Recent Labs  05/10/14 0430  NA 138  K 3.6*  CL 99  CO2 22  GLUCOSE 110*  BUN 15  CREATININE 0.62  CALCIUM 9.5    Recent Labs  05/10/14 0430  AST 21  ALT 10  ALKPHOS 78  BILITOT 0.3  PROT 7.4  ALBUMIN 3.7    Recent Labs  05/10/14 0430  LIPASE 26    Recent Labs  05/10/14 0430  WBC 11.9*  NEUTROABS 8.8*  HGB 13.3  HCT 40.6  MCV 86.9  PLT 307    Lab Results  Component Value Date   INR 1.02 01/28/2012    Radiological Exams on Admission: Ct Abdomen Pelvis W Contrast  05/10/2014   CLINICAL DATA:  Periumbilical abdominal pain, nausea and history of colitis.  EXAM: CT ABDOMEN AND PELVIS WITH CONTRAST  TECHNIQUE: Multidetector CT imaging of the abdomen and pelvis was performed using the standard protocol following bolus administration of intravenous contrast.  CONTRAST:  19mL OMNIPAQUE IOHEXOL 300 MG/ML  SOLN  COMPARISON:  03/26/2012  FINDINGS: The lower chest shows a massive hiatal hernia with much of the stomach contained in the chest. In the abdomen, the colon is largely fluid filled. Small bowel shows no evidence of dilatation or thickening. No free air, free fluid or abscess is  identified.  The liver, gallbladder, pancreas, spleen, adrenal glands and kidneys are within normal limits. No hernias, masses or enlarged lymph nodes are identified. No vascular abnormalities are seen. The bladder is moderately distended and unremarkable in appearance. No significant bony abnormalities.  IMPRESSION: 1. Stable large hiatal hernia. 2. Fluid throughout the colon may be reflective of enteritis. There is no evidence of bowel obstruction or significant bowel wall thickening.   Electronically Signed   By: Aletta Edouard M.D.   On: 05/10/2014 09:08   ENDOSCOPY PROCEDURE REPORT 01/28/2012 PATIENT: Tanikka, Bresnan MR#: [id number]  BIRTHDATE: 1936-03-23, 76 yrs. old GENDER: female  ENDOSCOPIST: Wilford Corner, MD  Referred by:  PROCEDURE DATE: 01/28/2012  PROCEDURE: EGD with biopsy, 67591  ASA CLASS: Class II  INDICATIONS: hematemesis  MEDICATIONS: Fentanyl 50 mcg IV, Versed 4  mg IV, Cetacaine spray  x 2  TOPICAL ANESTHETIC:  DESCRIPTION OF PROCEDURE: After the risks benefits and  alternatives of the procedure were thoroughly explained, informed  consent was obtained. The Pentax Gastroscope [id number] endoscope  was introduced through the mouth and advanced to the second  portion of the duodenum, without limitations. The instrument was  slowly withdrawn as the mucosa was fully examined.  <<PROCEDUREIMAGES>>  FINDINGS: The endoscope was inserted into the oropharynx and  esophagus was intubated. The gastroesophageal junction was noted  to be 30 cm from the incisors. Endoscope was advanced into the  stomach which revealed clear fluid in the stomach without any  mucosal abnormalities The endoscope was advanced to the duodenal  bulb and second portion of duodenum which were unremarkable. The  endoscope was withdrawn back into the stomach and retroflexion was  done which revealed edematous mucosa in the cardia with a  medium-sized hiatal hernia and normal-appearing fundus. The  endoscope was  straightened and upon withdrawing back into the GE  junction it was noted to be ulcerated circumferentially with an  area of edema and erythema that covered half of the circumference  of the lumen. This area was nodular and concerning for a  gastroesophageal lesion. It was noted at the proximal portion of  the hiatal hernia at the Z-line. The hiatal hernia extended from  30 cm from the incisors to 35 cm from the incisors. The  circumferential esophageal ulcer was adjacent to this  gastroesophageal junction lesion. The circumferential ulcer  extended from 30 cm to 25 cm from the incisors. Biopsies were  taken of the lesion to send for histologic purposes in appearance  of the lesion was concerning for adenocarcinoma.  COMPLICATIONS: None  ENDOSCOPIC IMPRESSION: 1. Gastroesophageal junction lesion -  status post biopsies of lesion  2. Large distal esophageal ulcer  3. Medium-sized hiatal hernia  RECOMMENDATIONS: 1. F/U on path  2. IV PPI Q 12 hours  3. Clear liquid diet  REPEAT EXAM: N/A  ______________________________  Wilford Corner, MD   CT ABDOMEN AND PELVIS WITH CONTRAST 03/26/2012 Technique: Multidetector CT imaging of the abdomen and pelvis was  performed following the standard protocol during bolus  administration of intravenous contrast.  Contrast: 1 OMNIPAQUE IOHEXOL 300 MG/ML SOLN, 145mL OMNIPAQUE  IOHEXOL 300 MG/ML SOLN  Comparison: None.  Findings: Bilateral pleural effusions with associated  consolidations; atelectasis versus infiltrate. The heart size is  upper normal. Moderate hiatal hernia.  Unremarkable liver. Distended gallbladder. No radiodense  gallstones. No biliary duct dilatation. Mild prominence of the  main pancreatic duct. Otherwise homogeneous pancreatic  enhancement.  Unremarkable spleen and adrenal glands.  Symmetric renal enhancement. No hydronephrosis or hydroureter.  Colonic wall thickening, most pronounced involving the transverse,  ascending  colon, and cecum. The appendix is distended up to 12 mm  and fails to opacify with contrast. No free intraperitoneal air or  fluid. No lymphadenopathy.  There is scattered atherosclerotic calcification of the aorta and  its branches. No aneurysmal dilatation.  Thin-walled bladder. Unremarkable CT appearance to the uterus. No  adnexal mass. Small amount of free fluid within the pelvis. Mild  stranding of the mesorectal fat.  Postoperative changes of the right femur. Osteopenia. Multilevel  degenerative changes. Fatty atrophy of the paraspinal musculature.  IMPRESSION:  Colonic wall thickening, most pronounced proximally. In keeping  with a nonspecific colitis with infectious, inflammatory, and  ischemic considerations. Pseudomembranous colitis is on the  differential.  The appendix is distended up to  12 mm. This may be secondary to  the above described process; however, appendicitis or mucocele is  not excluded.  Bilateral pleural effusions with associated consolidations;  atelectasis versus pneumonia.  Moderate hiatal hernia.  Dilated main pancreatic duct. No obstructing lesion identified by  CT. Can be further evaluated with ERCP or MRCP.  Discussed via telephone with [name], NP covering the patient (pager  [phone]) at 04:30 a.m. on 03/26/2012  Original Report Authenticated By: Suanne Marker, M.D.  Orders placed during the hospital encounter of 05/10/14  . EKG 12-LEAD  . EKG 12-LEAD    Assessment/Plan Active Problems:   Hiatal hernia with gastroesophageal reflux her presentation is strikingly similar to that of 2 years ago.  The CT read seems to show an increase in her hiatal hernia size.  I am not certain if given her function status, she would even be a candidate for Lap Nissen.  Will have GI see first as there is concern of her being heme positive despite not having appreciable blood loss, will check her H+H q12 hours and put on IV Protonix.  NPO for now in case  intervention needed as well.  Not on antiplatelets or anticoagulants at baseline.  Possible Cameron's ulcer given HH.  Awaiting GI's return call. Cerebral Palsy:  Chronic nursing home resident.  Will continue muscle relaxers and pain meds. Chronic constipation:  Her PO contrast likely helped.  TISOVEC,RICHARD W 05/10/2014, 9:45 AM

## 2014-05-10 NOTE — Progress Notes (Signed)
CT contrast at the bedside pt starting 1st cup

## 2014-05-10 NOTE — ED Notes (Signed)
Dr. Rosana Hoes in to see pt. Orders-- pt can come off telemetry.

## 2014-05-10 NOTE — ED Notes (Signed)
Pt is incontinent and very contracted on her legs, I and O cath attempted with no success. Dr. Claudine Mouton notified.

## 2014-05-10 NOTE — Progress Notes (Signed)
Unable to deliver contrast at this time, patient being cleaned up by nursing staff, unable to wait, as other exams waiting to be done. Will attempt again as soon as able

## 2014-05-10 NOTE — Consult Note (Signed)
[facility] Consult Note  Referring Provider: No ref. provider found Primary Care Physician:  Blanchie Serve, MD Primary Gastroenterologist:  Dr.  Laurel Dimmer Complaint: Coffee-ground emesis HPI: [name] is an 77 y.o. white female  nursing home resident with cerebral palsy and a known large hiatal hernia with a history of esophageal ulcer. She presented with coffee-ground emesis today with a hemoglobin of 13.3 and a CT scan of the abdomen she is showing a massive hiatal hernia. BUN was normal. She is apparently occult heme positive on stool guaiac. She denies any abdominal pain. She's not many blood thinners or nonsteroidal anti-inflammatory drugs.  Past Medical History  Diagnosis Date  . Constipation   . Cerebral palsy   . Hyperlipidemia     Past Surgical History  Procedure Laterality Date  . Right hip surgery    . Tonsillectomy    . Esophagogastroduodenoscopy  01/28/2012    Procedure: ESOPHAGOGASTRODUODENOSCOPY (EGD);  Surgeon: Lear Ng, MD;  Location: [facility] ENDOSCOPY;  Service: Endoscopy;  Laterality: N/A;    Medications Prior to Admission  Medication Sig Dispense Refill  . cholecalciferol (VITAMIN D) 1000 UNITS tablet Take 2,000 Units by mouth daily.      . citalopram (CELEXA) 40 MG tablet Take 40 mg by mouth daily.      . Cranberry 450 MG CAPS Take 1 capsule by mouth 2 (two) times daily.      . cyclobenzaprine (FLEXERIL) 10 MG tablet Take 10 mg by mouth 3 (three) times daily as needed for muscle spasms.      . diclofenac sodium (VOLTAREN) 1 % GEL Apply 2 g topically 3 (three) times daily. To each knee      . docusate sodium (COLACE) 100 MG capsule Take 100 mg by mouth 2 (two) times daily.      . famotidine (PEPCID) 20 MG tablet Take 20 mg by mouth 2 (two) times daily.      . ferrous gluconate (FERGON) 324 MG tablet Take 324 mg by mouth every evening.      Marland Kitchen HYDROcodone-acetaminophen (NORCO/VICODIN) 5-325 MG per tablet Take 1 tablet by mouth every 4 (four)  hours as needed for moderate pain.      Marland Kitchen loratadine (CLARITIN) 10 MG tablet Take 10 mg by mouth daily.      . polyethylene glycol (MIRALAX / GLYCOLAX) packet Take 17 g by mouth at bedtime.      . prochlorperazine (COMPAZINE) 5 MG tablet Take 5 mg by mouth 3 (three) times daily.      Marland Kitchen saccharomyces boulardii (FLORASTOR) 250 MG capsule Take 250 mg by mouth 2 (two) times daily.      . traMADol (ULTRAM) 50 MG tablet Take 50 mg by mouth 3 (three) times daily.        Allergies: No Known Allergies  History reviewed. No pertinent family history.  Social History:  reports that she has never smoked. She has never used smokeless tobacco. She reports that she does not drink alcohol or use illicit drugs.  Review of Systems: negative except as above   Blood pressure 113/62, pulse 86, temperature 98.1 F (36.7 C), temperature source Oral, resp. rate 15, SpO2 99.00%. Head: Normocephalic, without obvious abnormality, atraumatic Neck: no adenopathy, no carotid bruit, no JVD, supple, symmetrical, trachea midline and thyroid not enlarged, symmetric, no tenderness/mass/nodules Resp: clear to auscultation bilaterally Cardio: regular rate and rhythm, S1, S2 normal, no murmur, click, rub or gallop GI: Abdomen soft scaphoid nondistended with normoactive bowel sounds. No hepatosplenomegaly mass  or guarding. Extremities: extremities normal, atraumatic, no cyanosis or edema  Results for orders placed during the hospital encounter of 05/10/14 (from the past 48 hour(s))  CBC WITH DIFFERENTIAL     Status: Abnormal   Collection Time    05/10/14  4:30 AM      Result Value Ref Range   WBC 11.9 (*) 4.0 - 10.5 K/uL   RBC 4.67  3.87 - 5.11 MIL/uL   Hemoglobin 13.3  12.0 - 15.0 g/dL   HCT 40.6  36.0 - 46.0 %   MCV 86.9  78.0 - 100.0 fL   MCH 28.5  26.0 - 34.0 pg   MCHC 32.8  30.0 - 36.0 g/dL   RDW 15.3  11.5 - 15.5 %   Platelets 307  150 - 400 K/uL   Neutrophils Relative % 74  43 - 77 %   Neutro Abs 8.8 (*) 1.7  - 7.7 K/uL   Lymphocytes Relative 14  12 - 46 %   Lymphs Abs 1.6  0.7 - 4.0 K/uL   Monocytes Relative 12  3 - 12 %   Monocytes Absolute 1.4 (*) 0.1 - 1.0 K/uL   Eosinophils Relative 1  0 - 5 %   Eosinophils Absolute 0.1  0.0 - 0.7 K/uL   Basophils Relative 1  0 - 1 %   Basophils Absolute 0.1  0.0 - 0.1 K/uL  COMPREHENSIVE METABOLIC PANEL     Status: Abnormal   Collection Time    05/10/14  4:30 AM      Result Value Ref Range   Sodium 138  137 - 147 mEq/L   Potassium 3.6 (*) 3.7 - 5.3 mEq/L   Chloride 99  96 - 112 mEq/L   CO2 22  19 - 32 mEq/L   Glucose, Bld 110 (*) 70 - 99 mg/dL   BUN 15  6 - 23 mg/dL   Creatinine, Ser 0.62  0.50 - 1.10 mg/dL   Calcium 9.5  8.4 - 10.5 mg/dL   Total Protein 7.4  6.0 - 8.3 g/dL   Albumin 3.7  3.5 - 5.2 g/dL   AST 21  0 - 37 U/L   ALT 10  0 - 35 U/L   Alkaline Phosphatase 78  39 - 117 U/L   Total Bilirubin 0.3  0.3 - 1.2 mg/dL   GFR calc non Af Amer 85 (*) >90 mL/min   GFR calc Af Amer >90  >90 mL/min   Comment: (NOTE)     The eGFR has been calculated using the CKD EPI equation.     This calculation has not been validated in all clinical situations.     eGFR's persistently <90 mL/min signify possible Chronic Kidney     Disease.   Anion gap 17 (*) 5 - 15  LIPASE, BLOOD     Status: None   Collection Time    05/10/14  4:30 AM      Result Value Ref Range   Lipase 26  11 - 59 U/L  POC OCCULT BLOOD, ED     Status: Abnormal   Collection Time    05/10/14  5:18 AM      Result Value Ref Range   Fecal Occult Bld POSITIVE (*) NEGATIVE  I-STAT CG4 LACTIC ACID, ED     Status: None   Collection Time    05/10/14  5:19 AM      Result Value Ref Range   Lactic Acid, Venous 1.37  0.5 - 2.2 mmol/L  TYPE AND SCREEN     Status: None   Collection Time    05/10/14  6:21 AM      Result Value Ref Range   ABO/RH(D) O POS     Antibody Screen POS     Sample Expiration 05/13/2014     Antibody Identification ANTI K     DAT, IgG NEG     PT AG Type NEGATIVE FOR  KELL ANTIGEN     Ct Abdomen Pelvis W Contrast  05/10/2014   CLINICAL DATA:  Periumbilical abdominal pain, nausea and history of colitis.  EXAM: CT ABDOMEN AND PELVIS WITH CONTRAST  TECHNIQUE: Multidetector CT imaging of the abdomen and pelvis was performed using the standard protocol following bolus administration of intravenous contrast.  CONTRAST:  45m OMNIPAQUE IOHEXOL 300 MG/ML  SOLN  COMPARISON:  03/26/2012  FINDINGS: The lower chest shows a massive hiatal hernia with much of the stomach contained in the chest. In the abdomen, the colon is largely fluid filled. Small bowel shows no evidence of dilatation or thickening. No free air, free fluid or abscess is identified.  The liver, gallbladder, pancreas, spleen, adrenal glands and kidneys are within normal limits. No hernias, masses or enlarged lymph nodes are identified. No vascular abnormalities are seen. The bladder is moderately distended and unremarkable in appearance. No significant bony abnormalities.  IMPRESSION: 1. Stable large hiatal hernia. 2. Fluid throughout the colon may be reflective of enteritis. There is no evidence of bowel obstruction or significant bowel wall thickening.   Electronically Signed   By: GAletta EdouardM.D.   On: 05/10/2014 09:08    Assessment: Presentation consistent with upper GI bleed with known hiatal hernia, rule out recurrent or persistent esophageal ulcer, possible gastric volvulus. Plan:  Addition, empiric PPI therapy, and we'll plan endoscopy today. HLLIYI,YUWCC 05/10/2014, 12:18 PM

## 2014-05-10 NOTE — ED Provider Notes (Signed)
CSN: [id number]     Arrival date & time 05/10/14  0416 History   First MD Initiated Contact with Patient 05/10/14 0441     Chief Complaint  Patient presents with  . Emesis     (Consider location/radiation/quality/duration/timing/severity/associated sxs/prior Treatment) HPI [name] is a 78 y.o. female with past medical history of cerebral palsy coming in with vomiting. She states this has been going on for the past 2 days. She states is worse than her sheets but she gets very nauseated. She initially, she has periumbilical pain as well however when asked to describe it she states that it feels like a nauseating sensation. Difficult to obtain a history whether or not she has true abdominal pain. She denies fever, and she is incontinent at baseline. There is no chest pain or shortness of breath. Nothing has made her symptoms better over the last 2 days. Patient has no further complaints.  10 Systems reviewed and are negative for acute change except as noted in the HPI.     Past Medical History  Diagnosis Date  . Constipation   . Cerebral palsy   . Hyperlipidemia    Past Surgical History  Procedure Laterality Date  . Right hip surgery    . Tonsillectomy    . Esophagogastroduodenoscopy  01/28/2012    Procedure: ESOPHAGOGASTRODUODENOSCOPY (EGD);  Surgeon: Lear Ng, MD;  Location: [facility] ENDOSCOPY;  Service: Endoscopy;  Laterality: N/A;   History reviewed. No pertinent family history. History  Substance Use Topics  . Smoking status: Never Smoker   . Smokeless tobacco: Never Used  . Alcohol Use: No   OB History   Grav Para Term Preterm Abortions TAB SAB Ect Mult Living                 Review of Systems    Allergies  Review of patient's allergies indicates no known allergies.  Home Medications   Prior to Admission medications   Medication Sig Start Date End Date Taking? Authorizing Provider  cholecalciferol (VITAMIN D) 1000 UNITS tablet Take 2,000 Units by  mouth daily.   Yes Historical Provider, MD  citalopram (CELEXA) 40 MG tablet Take 40 mg by mouth daily.   Yes Historical Provider, MD  Cranberry 450 MG CAPS Take 1 capsule by mouth 2 (two) times daily.   Yes Historical Provider, MD  cyclobenzaprine (FLEXERIL) 10 MG tablet Take 10 mg by mouth 3 (three) times daily as needed for muscle spasms.   Yes Historical Provider, MD  diclofenac sodium (VOLTAREN) 1 % GEL Apply 2 g topically 3 (three) times daily. To each knee   Yes Historical Provider, MD  docusate sodium (COLACE) 100 MG capsule Take 100 mg by mouth 2 (two) times daily.   Yes Historical Provider, MD  famotidine (PEPCID) 20 MG tablet Take 20 mg by mouth 2 (two) times daily.   Yes Historical Provider, MD  ferrous gluconate (FERGON) 324 MG tablet Take 324 mg by mouth every evening.   Yes Historical Provider, MD  HYDROcodone-acetaminophen (NORCO/VICODIN) 5-325 MG per tablet Take 1 tablet by mouth every 4 (four) hours as needed for moderate pain.   Yes Historical Provider, MD  loratadine (CLARITIN) 10 MG tablet Take 10 mg by mouth daily.   Yes Historical Provider, MD  polyethylene glycol (MIRALAX / GLYCOLAX) packet Take 17 g by mouth at bedtime.   Yes Historical Provider, MD  prochlorperazine (COMPAZINE) 5 MG tablet Take 5 mg by mouth 3 (three) times daily.   Yes Historical  Provider, MD  saccharomyces boulardii (FLORASTOR) 250 MG capsule Take 250 mg by mouth 2 (two) times daily.   Yes Historical Provider, MD  traMADol (ULTRAM) 50 MG tablet Take 50 mg by mouth 3 (three) times daily.   Yes Historical Provider, MD   BP 114/69  Pulse 96  Temp(Src) 98.1 F (36.7 C) (Oral)  Resp 32  SpO2 91% Physical Exam  Nursing note and vitals reviewed. Constitutional: She is oriented to person, place, and time. She appears well-developed and well-nourished. No distress.  HENT:  Head: Normocephalic and atraumatic.  Nose: Nose normal.  Mouth/Throat: Oropharynx is clear and moist. No oropharyngeal exudate.   Eyes: Conjunctivae and EOM are normal. Pupils are equal, round, and reactive to light. No scleral icterus.  Neck: Normal range of motion. Neck supple. No JVD present. No tracheal deviation present. No thyromegaly present.  Cardiovascular: Normal rate, regular rhythm and normal heart sounds.  Exam reveals no gallop and no friction rub.   No murmur heard. Pulmonary/Chest: Effort normal and breath sounds normal. No respiratory distress. She has no wheezes. She exhibits no tenderness.  Abdominal: Soft. Bowel sounds are normal. She exhibits no distension and no mass. There is no tenderness. There is no rebound and no guarding.  Musculoskeletal: Normal range of motion. She exhibits no edema and no tenderness.  Bilateral lower extremity contractures  Lymphadenopathy:    She has no cervical adenopathy.  Neurological: She is alert and oriented to person, place, and time. No cranial nerve deficit. She exhibits abnormal muscle tone.  Skin: Skin is warm and dry. No rash noted. She is not diaphoretic. No erythema. No pallor.    ED Course  Procedures (including critical care time) Labs Review Labs Reviewed  CBC WITH DIFFERENTIAL - Abnormal; Notable for the following:    WBC 11.9 (*)    Neutro Abs 8.8 (*)    Monocytes Absolute 1.4 (*)    All other components within normal limits  COMPREHENSIVE METABOLIC PANEL - Abnormal; Notable for the following:    Potassium 3.6 (*)    Glucose, Bld 110 (*)    GFR calc non Af Amer 85 (*)    Anion gap 17 (*)    All other components within normal limits  POC OCCULT BLOOD, ED - Abnormal; Notable for the following:    Fecal Occult Bld POSITIVE (*)    All other components within normal limits  URINE CULTURE  LIPASE, BLOOD  URINALYSIS, ROUTINE W REFLEX MICROSCOPIC  INFLUENZA PANEL BY PCR (TYPE A & B, H1N1)  CBC  CREATININE, SERUM  HEMOGLOBIN AND HEMATOCRIT, BLOOD  I-STAT CG4 LACTIC ACID, ED  TYPE AND SCREEN    Imaging Review No results found.   EKG  Interpretation None      MDM   Final diagnoses:  None    Patient presents emergency department for nausea and vomiting for 2 days. She possibly has abdominal pain as well. Will evaluate with laboratory studies and CT scan of abdomen. Hemoccult was positive but hemoglobin is normal.  CT scan is pending for this high risk elderly patient.  She was signed out to Dr. Venora Maples with instructions to DC if scan is normal.  Please see his note for the ultimate disposition.      Everlene Balls, MD 05/10/14 1431

## 2014-05-10 NOTE — ED Notes (Signed)
CT made aware of pt finishing contrast. Only able to drink 1/2 of each cup. MD aware as well.

## 2014-05-10 NOTE — Op Note (Signed)
[facility] [place], [id number]   ENDOSCOPY PROCEDURE REPORT  PATIENT: Sharon, Cummings  MR#: [id number] BIRTHDATE: 01/20/1936 , 78  yrs. old GENDER: female ENDOSCOPIST:Ade Stmarie Amedeo Plenty, MD REFERRED BY: PROCEDURE DATE:  2014-05-11 PROCEDURE: ASA CLASS: INDICATIONS: coffee-ground emesis MEDICATION: Versed 3 mg TOPICAL ANESTHETIC:   Cetacaine spray  DESCRIPTION OF PROCEDURE:   After the risks and benefits of the procedure were explained, informed consent was obtained.  The Pentax Gastroscope [id number]  endoscope was introduced through the mouth  and advanced to the second portion of the duodenum .  The instrument was slowly withdrawn as the mucosa was fully examined.    severe erosive esophagitis from the squamocolumnar align at 28 cm to approximately 21 cm with 2 or 3 small friable areas and intermittent exudate no definite ulcer or visible vessel or active bleeding. Very large probably 10 cm hiatal hernia, some small amount of coffee ground material in the hernia sac, no definite ulcer or Cameron erosions. Distal stomach unremarkable with pylorus and antrum nondeformed. Minimal coffee-ground material in the distal stomach Duodenum normal bulb and second portion               The scope was then withdrawn from the patient and the procedure completed.  COMPLICATIONS: There were no immediate complications.  ENDOSCOPIC IMPRESSION: severe erosive esophagitis with very large hiatal hernia no evidence of gastric volvulus no active bleeding source identified except for esophagitis RECOMMENDATIONS: double dose PPI therapy, hold any anticoagulants as possible, elevate head of the bed, anti-reflex precautions. Will follow with you.   _______________________________ eSigned:  Teena Irani, MD May 11, 2014 12:46 PM     cc:  CPT CODES: ICD CODES:  The ICD and CPT codes recommended by this software are interpretations from the data that the  clinical staff has captured with the software.  The verification of the translation of this report to the ICD and CPT codes and modifiers is the sole responsibility of the health care institution and practicing physician where this report was generated.  [employer] will not be held responsible for the validity of the ICD and CPT codes included on this report.  AMA assumes no liability for data contained or not contained herein. CPT is a Designer, television/film set of the [employer].

## 2014-05-10 NOTE — ED Notes (Signed)
Tried to cath x2attempt unable

## 2014-05-10 NOTE — ED Notes (Signed)
Pt complaining of knee pain-- requesting the "knee pill" that she gets at the nursing home. Does not want the Tylenol that is ordered.

## 2014-05-10 NOTE — ED Provider Notes (Signed)
9:37 AM Patient vomited much of her contrast back up as well.  CT scan demonstrates large hiatal hernia with much of the stomach up in the chest cavity.  She reports she's been nauseated every time she eats over the past several weeks.  She began vomiting 2 days ago.  IV fluids were initiated emergency department.  Given the fact she continues to vomit with oral intake in emergency apartment I think the patient will benefit from hospitalization.  I think she'll likely benefit from general surgery consultation as well she may be a candidate for repair of her hiatal hernia.  i will discuss the case with her primary care team which is Dr. Forde Dandy.   Hoy Morn, MD 05/10/14 [phone]

## 2014-05-11 ENCOUNTER — Encounter (HOSPITAL_COMMUNITY): Payer: Self-pay | Admitting: Gastroenterology

## 2014-05-11 DIAGNOSIS — K210 Gastro-esophageal reflux disease with esophagitis: Secondary | ICD-10-CM | POA: Diagnosis not present

## 2014-05-11 LAB — BASIC METABOLIC PANEL
Anion gap: 13 (ref 5–15)
BUN: 9 mg/dL (ref 6–23)
CO2: 21 mEq/L (ref 19–32)
Calcium: 8.4 mg/dL (ref 8.4–10.5)
Chloride: 107 mEq/L (ref 96–112)
Creatinine, Ser: 0.53 mg/dL (ref 0.50–1.10)
GFR calc Af Amer: >90 mL/min (ref >90)
GFR calc non Af Amer: 90 mL/min — ABNORMAL LOW (ref >90)
Glucose, Bld: 61 mg/dL — ABNORMAL LOW (ref 70–99)
Potassium: 3.6 mEq/L — ABNORMAL LOW (ref 3.7–5.3)
SODIUM: 141 meq/L (ref 137–147)

## 2014-05-11 LAB — CBC
HCT: 34.9 % — ABNORMAL LOW (ref 36.0–46.0)
Hemoglobin: 11.2 g/dL — ABNORMAL LOW (ref 12.0–15.0)
MCH: 27.7 pg (ref 26.0–34.0)
MCHC: 32.1 g/dL (ref 30.0–36.0)
MCV: 86.4 fL (ref 78.0–100.0)
PLATELETS: 206 10*3/uL (ref 150–400)
RBC: 4.04 MIL/uL (ref 3.87–5.11)
RDW: 15.5 % (ref 11.5–15.5)
WBC: 4.4 10*3/uL (ref 4.0–10.5)

## 2014-05-11 LAB — INFLUENZA PANEL BY PCR (TYPE A & B)
H1N1FLUPCR: NOT DETECTED
INFLAPCR: NEGATIVE
INFLBPCR: NEGATIVE

## 2014-05-11 LAB — MRSA PCR SCREENING: MRSA by PCR: POSITIVE — AB

## 2014-05-11 MED ORDER — MUPIROCIN 2 % EX OINT
1.0000 "application " | TOPICAL_OINTMENT | Freq: Two times a day (BID) | CUTANEOUS | Status: DC
  Administered 2014-05-11 – 2014-05-12 (×3): 1 via NASAL
  Filled 2014-05-11: qty 22

## 2014-05-11 MED ORDER — CHLORHEXIDINE GLUCONATE CLOTH 2 % EX PADS
6.0000 | MEDICATED_PAD | Freq: Every day | CUTANEOUS | Status: DC
  Administered 2014-05-11 – 2014-05-12 (×2): 6 via TOPICAL

## 2014-05-11 NOTE — Progress Notes (Signed)
Subjective: Alert, no complaints  Objective: Vital signs in last 24 hours: Temp:  [98.1 F (36.7 C)-98.6 F (37 C)] 98.1 F (36.7 C) (10/12 0622) Pulse Rate:  [69-101] 70 (10/12 0622) Resp:  [15-20] 18 (10/12 0622) BP: (94-163)/(40-92) 119/61 mmHg (10/12 0622) SpO2:  [90 %-100 %] 100 % (10/12 0622) Weight:  [48.943 kg (107 lb 14.4 oz)] 48.943 kg (107 lb 14.4 oz) (10/11 1309) Weight change:   CBG (last 3)  No results found for this basename: GLUCAP,  in the last 72 hours  Intake/Output from previous day: 10/11 0701 - 10/12 0700 In: 391.3 [I.V.:391.3] Out: 3 [Stool:3]  Physical Exam:  General appearance: alert, cooperative and appears stated age  Head: Normocephalic, without obvious abnormality, atraumatic  Eyes: conjunctivae/corneas clear. PERRL, EOM's intact.  Nose: Nares normal. Septum midline. Mucosa normal. No drainage or sinus tenderness.  Throat: lips, mucosa, and tongue normal; teeth and gums normal  Neck: no adenopathy, no carotid bruit, no JVD and thyroid not enlarged, symmetric, no tenderness/mass/nodules  Resp: clear to auscultation bilaterally  Cardio: regular rate and rhythm, S1, S2 normal, no murmur, click, rub or gallop  GI: soft, mild mid and upper epigastric tenderness; bowel sounds normal; no masses, no organomegaly  Contractures c/w Cerebral Palsy  Pulses: 2+ and symmetric  Lymph nodes: Cervical adenopathy: no cervical lymphadenopathy  Neurologic: Alert and oriented X 3, Cerebral palsy changes as per prior notation, nonacute   Lab Results:  Recent Labs  05/10/14 0430 05/11/14 0558  NA 138 141  K 3.6* 3.6*  CL 99 107  CO2 22 21  GLUCOSE 110* 61*  BUN 15 9  CREATININE 0.62 0.53  CALCIUM 9.5 8.4    Recent Labs  05/10/14 0430  AST 21  ALT 10  ALKPHOS 78  BILITOT 0.3  PROT 7.4  ALBUMIN 3.7    Recent Labs  05/10/14 0430 05/10/14 1819 05/11/14 0558  WBC 11.9*  --  4.4  NEUTROABS 8.8*  --   --   HGB 13.3 10.6* 11.2*  HCT 40.6 32.4*  34.9*  MCV 86.9  --  86.4  PLT 307  --  206   Lab Results  Component Value Date   INR 1.02 01/28/2012   No results found for this basename: CKTOTAL, CKMB, CKMBINDEX, TROPONINI,  in the last 72 hours No results found for this basename: TSH, T4TOTAL, FREET3, T3FREE, THYROIDAB,  in the last 72 hours No results found for this basename: VITAMINB12, FOLATE, FERRITIN, TIBC, IRON, RETICCTPCT,  in the last 72 hours  Studies/Results: Ct Abdomen Pelvis W Contrast  05/10/2014   CLINICAL DATA:  Periumbilical abdominal pain, nausea and history of colitis.  EXAM: CT ABDOMEN AND PELVIS WITH CONTRAST  TECHNIQUE: Multidetector CT imaging of the abdomen and pelvis was performed using the standard protocol following bolus administration of intravenous contrast.  CONTRAST:  43mL OMNIPAQUE IOHEXOL 300 MG/ML  SOLN  COMPARISON:  03/26/2012  FINDINGS: The lower chest shows a massive hiatal hernia with much of the stomach contained in the chest. In the abdomen, the colon is largely fluid filled. Small bowel shows no evidence of dilatation or thickening. No free air, free fluid or abscess is identified.  The liver, gallbladder, pancreas, spleen, adrenal glands and kidneys are within normal limits. No hernias, masses or enlarged lymph nodes are identified. No vascular abnormalities are seen. The bladder is moderately distended and unremarkable in appearance. No significant bony abnormalities.  IMPRESSION: 1. Stable large hiatal hernia. 2. Fluid throughout the colon may be  reflective of enteritis. There is no evidence of bowel obstruction or significant bowel wall thickening.   Electronically Signed   By: Aletta Edouard M.D.   On: 05/10/2014 09:08     Assessment/Plan: 1. UGI bleed- cbc stable, adv diet   LOS: 1 day   [place] Monds A 05/11/2014, 11:02 AM

## 2014-05-11 NOTE — Progress Notes (Signed)
[facility] Progress Note  Subjective: No nausea abdominal pain or chest pain. No further vomiting  Objective: Vital signs in last 24 hours: Temp:  [98.1 F (36.7 C)-98.6 F (37 C)] 98.1 F (36.7 C) (10/12 0622) Pulse Rate:  [69-101] 70 (10/12 0622) Resp:  [15-20] 18 (10/12 0622) BP: (94-163)/(40-105) 119/61 mmHg (10/12 0622) SpO2:  [90 %-100 %] 100 % (10/12 0622) Weight:  [48.943 kg (107 lb 14.4 oz)] 48.943 kg (107 lb 14.4 oz) (10/11 1309) Weight change:    PE: Abdomen soft  Lab Results: Results for orders placed during the hospital encounter of 05/10/14 (from the past 24 hour(s))  HEMOGLOBIN AND HEMATOCRIT, BLOOD     Status: Abnormal   Collection Time    05/10/14  6:19 PM      Result Value Ref Range   Hemoglobin 10.6 (*) 12.0 - 15.0 g/dL   HCT 32.4 (*) 36.0 - 46.0 %  MRSA PCR SCREENING     Status: Abnormal   Collection Time    05/11/14  2:05 AM      Result Value Ref Range   MRSA by PCR POSITIVE (*) NEGATIVE  CBC     Status: Abnormal   Collection Time    05/11/14  5:58 AM      Result Value Ref Range   WBC 4.4  4.0 - 10.5 K/uL   RBC 4.04  3.87 - 5.11 MIL/uL   Hemoglobin 11.2 (*) 12.0 - 15.0 g/dL   HCT 34.9 (*) 36.0 - 46.0 %   MCV 86.4  78.0 - 100.0 fL   MCH 27.7  26.0 - 34.0 pg   MCHC 32.1  30.0 - 36.0 g/dL   RDW 15.5  11.5 - 15.5 %   Platelets 206  150 - 400 K/uL  BASIC METABOLIC PANEL     Status: Abnormal   Collection Time    05/11/14  5:58 AM      Result Value Ref Range   Sodium 141  137 - 147 mEq/L   Potassium 3.6 (*) 3.7 - 5.3 mEq/L   Chloride 107  96 - 112 mEq/L   CO2 21  19 - 32 mEq/L   Glucose, Bld 61 (*) 70 - 99 mg/dL   BUN 9  6 - 23 mg/dL   Creatinine, Ser 0.53  0.50 - 1.10 mg/dL   Calcium 8.4  8.4 - 10.5 mg/dL   GFR calc non Af Amer 90 (*) >90 mL/min   GFR calc Af Amer >90  >90 mL/min   Anion gap 13  5 - 15    Studies/Results: Ct Abdomen Pelvis W Contrast  05/10/2014   CLINICAL DATA:  Periumbilical abdominal pain, nausea and  history of colitis.  EXAM: CT ABDOMEN AND PELVIS WITH CONTRAST  TECHNIQUE: Multidetector CT imaging of the abdomen and pelvis was performed using the standard protocol following bolus administration of intravenous contrast.  CONTRAST:  34mL OMNIPAQUE IOHEXOL 300 MG/ML  SOLN  COMPARISON:  03/26/2012  FINDINGS: The lower chest shows a massive hiatal hernia with much of the stomach contained in the chest. In the abdomen, the colon is largely fluid filled. Small bowel shows no evidence of dilatation or thickening. No free air, free fluid or abscess is identified.  The liver, gallbladder, pancreas, spleen, adrenal glands and kidneys are within normal limits. No hernias, masses or enlarged lymph nodes are identified. No vascular abnormalities are seen. The bladder is moderately distended and unremarkable in appearance. No significant bony abnormalities.  IMPRESSION: 1. Stable large  hiatal hernia. 2. Fluid throughout the colon may be reflective of enteritis. There is no evidence of bowel obstruction or significant bowel wall thickening.   Electronically Signed   By: Aletta Edouard M.D.   On: 05/10/2014 09:08      Assessment: Coffee-ground emesis associated with large hiatal hernia and diffuse friable esophagitis no other bleeding source seen on EGD.  Plan: 1. Continue IV proton pump inhibitor 2. Will try clear liquid diet today and advance as tolerated.    Tamantha Saline C 05/11/2014, 7:50 AM

## 2014-05-12 MED ORDER — PANTOPRAZOLE SODIUM 40 MG PO TBEC: 40.0000 mg | DELAYED_RELEASE_TABLET | Freq: Two times a day (BID) | ORAL | Status: AC

## 2014-05-12 MED ORDER — POLYETHYLENE GLYCOL 3350 17 G PO PACK
17.0000 g | PACK | Freq: Two times a day (BID) | ORAL | Status: DC
  Administered 2014-05-12: 17 g via ORAL

## 2014-05-12 NOTE — Clinical Social Work Note (Signed)
Pt to be discharged to [facility]. Pt's daughter, Wendall Stade, updated via phone.  [facility]: [phone] Transportation: EMS ([address])  Lubertha Sayres, [place] ([phone]) Licensed Clinical Social Worker Orthopedics [phone]) and Surgical [phone])

## 2014-05-12 NOTE — Discharge Summary (Signed)
DISCHARGE SUMMARY  SHARESA Cummings  MR#: [id number]  DOB:06-02-1936  Date of Admission: 05/10/2014 Date of Discharge: 05/12/2014  Attending Physician:[name] A  Patient's WUJ:WJXBJY, Sharon Height, MD  Consults:Treatment Team:  Sharon Sabins, MD  Discharge Diagnoses: Active Problems:   Hiatal hernia with gastroesophageal reflux   UGI bleed due to esophagitis   Cerebral palsy   Chronic constipation     Discharge Medications:   Medication List    STOP taking these medications       diclofenac sodium 1 % Gel  Commonly known as:  VOLTAREN     famotidine 20 MG tablet  Commonly known as:  PEPCID      TAKE these medications       cholecalciferol 1000 UNITS tablet  Commonly known as:  VITAMIN D  Take 2,000 Units by mouth daily.     citalopram 40 MG tablet  Commonly known as:  CELEXA  Take 40 mg by mouth daily.     Cranberry 450 MG Caps  Take 1 capsule by mouth 2 (two) times daily.     cyclobenzaprine 10 MG tablet  Commonly known as:  FLEXERIL  Take 10 mg by mouth 3 (three) times daily as needed for muscle spasms.     docusate sodium 100 MG capsule  Commonly known as:  COLACE  Take 100 mg by mouth 2 (two) times daily.     ferrous gluconate 324 MG tablet  Commonly known as:  FERGON  Take 324 mg by mouth every evening.     HYDROcodone-acetaminophen 5-325 MG per tablet  Commonly known as:  NORCO/VICODIN  Take 1 tablet by mouth every 4 (four) hours as needed for moderate pain.     loratadine 10 MG tablet  Commonly known as:  CLARITIN  Take 10 mg by mouth daily.     pantoprazole 40 MG tablet  Commonly known as:  PROTONIX  Take 1 tablet (40 mg total) by mouth 2 (two) times daily.     polyethylene glycol packet  Commonly known as:  MIRALAX / GLYCOLAX  Take 17 g by mouth at bedtime.     prochlorperazine 5 MG tablet  Commonly known as:  COMPAZINE  Take 5 mg by mouth 3 (three) times daily.     saccharomyces boulardii 250 MG capsule  Commonly known as:   FLORASTOR  Take 250 mg by mouth 2 (two) times daily.     traMADol 50 MG tablet  Commonly known as:  ULTRAM  Take 50 mg by mouth 3 (three) times daily.        Hospital Procedures: Ct Abdomen Pelvis W Contrast  05/10/2014   CLINICAL DATA:  Periumbilical abdominal pain, nausea and history of colitis.  EXAM: CT ABDOMEN AND PELVIS WITH CONTRAST  TECHNIQUE: Multidetector CT imaging of the abdomen and pelvis was performed using the standard protocol following bolus administration of intravenous contrast.  CONTRAST:  44mL OMNIPAQUE IOHEXOL 300 MG/ML  SOLN  COMPARISON:  03/26/2012  FINDINGS: The lower chest shows a massive hiatal hernia with much of the stomach contained in the chest. In the abdomen, the colon is largely fluid filled. Small bowel shows no evidence of dilatation or thickening. No free air, free fluid or abscess is identified.  The liver, gallbladder, pancreas, spleen, adrenal glands and kidneys are within normal limits. No hernias, masses or enlarged lymph nodes are identified. No vascular abnormalities are seen. The bladder is moderately distended and unremarkable in appearance. No significant bony abnormalities.  IMPRESSION: 1. Stable large  hiatal hernia. 2. Fluid throughout the colon may be reflective of enteritis. There is no evidence of bowel obstruction or significant bowel wall thickening.   Electronically Signed   By: [name] M.D.   On: 05/10/2014 09:08    History of Present Illness:  Patient is a 78 year old female resident of [facility] x 2 years who was taken to the ER after I was called at 3am with the patient having heme positive "coffee ground" emesis. She is not on Coumadin, antricoagulants or antiplatelets, and per her med list was taking Pepcid. Workup at the ER revealed her to be heme positive, a somewhat elevated BUN for her as hers is normally 3 or less, and a CT of her abdomen which revealed a "massive" hiatal hernia with much of her stomach  contained in the chest and some fluid in the colon possibly related to enteritis, although she does not have lower GI symptoms. Of note, her last EGD was per Dr. Michail Cummings 2 years ago.  She relates 2 days worth of symptoms and a chronic nausea sensation as well. Legs cramping a bit from the gurney. Nursing has noted considerable dark stool output and another episode of emesis in the ER which appeared coffee ground as well.    Hospital Course: Admitted, hydrated and placed on PPI.  EGD confirmed esophagitris and gastritis.  Diet advanced and dischgarded back to snf.  Hemoglobin stable.  Day of Discharge Exam BP 106/58  Pulse 61  Temp(Src) 98.4 F (36.9 C) (Oral)  Resp 16  Wt 48.943 kg (107 lb 14.4 oz)  SpO2 95%  Physical Exam: General appearance: alert Eyes: no scleral icterus Throat: oropharynx moist without erythema Resp: clear to auscultation bilaterally Cardio: regular rate and rhythm Extremities: no clubbing, cyanosis or edema  Discharge Labs:  Recent Labs  05/10/14 0430 05/11/14 0558  NA 138 141  K 3.6* 3.6*  CL 99 107  CO2 22 21  GLUCOSE 110* 61*  BUN 15 9  CREATININE 0.62 0.53  CALCIUM 9.5 8.4    Recent Labs  05/10/14 0430  AST 21  ALT 10  ALKPHOS 78  BILITOT 0.3  PROT 7.4  ALBUMIN 3.7    Recent Labs  05/10/14 0430 05/10/14 1819 05/11/14 0558  WBC 11.9*  --  4.4  NEUTROABS 8.8*  --   --   HGB 13.3 10.6* 11.2*  HCT 40.6 32.4* 34.9*  MCV 86.9  --  86.4  PLT 307  --  206   No results found for this basename: CKTOTAL, CKMB, CKMBINDEX, TROPONINI,  in the last 72 hours No results found for this basename: TSH, T4TOTAL, FREET3, T3FREE, THYROIDAB,  in the last 72 hours No results found for this basename: VITAMINB12, FOLATE, FERRITIN, TIBC, IRON, RETICCTPCT,  in the last 72 hours  Discharge instructions:     Discharge Instructions   Diet - low sodium heart healthy    Complete by:  As directed      Increase activity slowly    Complete by:  As  directed            Disposition: snf  Follow-up Appts: Follow-up with Dr. Reynaldo Cummings at [facility] in 1week.  Call for appointment.  Condition on Discharge: stable  Tests Needing Follow-up: Cbc 1 week  Signed: Sheniya Cummings A 05/12/2014, 12:43 PM

## 2014-05-12 NOTE — Progress Notes (Signed)
[facility] Progress Note  Subjective: Tolerating clear liquid diet no nausea vomiting or significant heartburn. Worried about not having a bowel movement  Objective: Vital signs in last 24 hours: Temp:  [97.4 F (36.3 C)-98.4 F (36.9 C)] 98.4 F (36.9 C) (10/13 0512) Pulse Rate:  [59-76] 61 (10/13 0512) Resp:  [16-18] 16 (10/13 0512) BP: (106-110)/(54-59) 106/58 mmHg (10/13 0512) SpO2:  [95 %-97 %] 95 % (10/13 0512) Weight change:    PE: Alert oriented abdomen soft  Lab Results: No results found for this or any previous visit (from the past 24 hour(s)).  Studies/Results: No results found.    Assessment: GI bleed related to esophagitis and large hiatal hernia, clinically stable  Plan: 1. Continue double dose PPI 2. Advance diet today 3. MiraLAX for constipation 4. Possibly home tomorrow if stable    [name] C 05/12/2014, 9:26 AM

## 2014-05-12 NOTE — Clinical Social Work Psychosocial (Signed)
Clinical Social Work Department BRIEF PSYCHOSOCIAL ASSESSMENT 05/12/2014  Patient:  [name], [name]     Account Number:  [id number]     Admit date:  05/10/2014  Clinical Social Worker:  Delrae Sawyers  Date/Time:  05/12/2014 02:02 PM  Referred by:  Physician  Date Referred:  05/12/2014 Referred for  SNF Placement   Other Referral:   none.   Interview type:  Family Other interview type:   CSW spoke with pt's daughter, Wendall Stade ([phone]).    PSYCHOSOCIAL DATA Living Status:  FACILITY Admitted from facility:  [facility] Level of care:  [place] Primary support name:  Wendall Stade Primary support relationship to patient:  CHILD, ADULT Degree of support available:   Strong support system.    CURRENT CONCERNS Current Concerns  Post-Acute Placement   Other Concerns:   none.    SOCIAL WORK ASSESSMENT / PLAN CSW received referral stating pt is from [facility] (long-term). CSW spoke with pt's daughter who stated pt's family would prefer for pt to return to [facility] once medically stable for discharge.    CSW to continue to follow and assist with discharge planning needs.   Assessment/plan status:  Psychosocial Support/Ongoing Assessment of Needs Other assessment/ plan:   none.   Information/referral to community resources:   Pt to return to [facility].    PATIENT'S/FAMILY'S RESPONSE TO PLAN OF CARE: Pt's family understanding and agreeable to CSW plan of care. Pt's family expressed no further questions or concerns at this time.       Lubertha Sayres, [place] ([phone]) Licensed Clinical Social Worker Orthopedics [phone]) and Surgical [phone])

## 2014-05-12 NOTE — Progress Notes (Signed)
Patient for discharge to [facility], report given to nurse Baker Janus, now awaiting transporter.

## 2014-05-12 NOTE — Progress Notes (Signed)
Patient discharged via [facility].  

## 2014-05-29 DIAGNOSIS — M25569 Pain in unspecified knee: Secondary | ICD-10-CM | POA: Diagnosis not present

## 2014-05-29 DIAGNOSIS — R110 Nausea: Secondary | ICD-10-CM | POA: Diagnosis not present

## 2014-05-29 DIAGNOSIS — R197 Diarrhea, unspecified: Secondary | ICD-10-CM | POA: Diagnosis not present

## 2014-06-04 DIAGNOSIS — F320 Major depressive disorder, single episode, mild: Secondary | ICD-10-CM | POA: Diagnosis not present

## 2014-06-04 DIAGNOSIS — K219 Gastro-esophageal reflux disease without esophagitis: Secondary | ICD-10-CM | POA: Diagnosis not present

## 2014-06-04 DIAGNOSIS — G809 Cerebral palsy, unspecified: Secondary | ICD-10-CM | POA: Diagnosis not present

## 2014-06-04 DIAGNOSIS — M25569 Pain in unspecified knee: Secondary | ICD-10-CM | POA: Diagnosis not present

## 2014-06-12 DIAGNOSIS — M179 Osteoarthritis of knee, unspecified: Secondary | ICD-10-CM | POA: Diagnosis not present

## 2014-06-12 DIAGNOSIS — M25569 Pain in unspecified knee: Secondary | ICD-10-CM | POA: Diagnosis not present

## 2014-06-12 DIAGNOSIS — G809 Cerebral palsy, unspecified: Secondary | ICD-10-CM | POA: Diagnosis not present

## 2014-06-12 DIAGNOSIS — K117 Disturbances of salivary secretion: Secondary | ICD-10-CM | POA: Diagnosis not present

## 2014-06-15 DIAGNOSIS — M6240 Contracture of muscle, unspecified site: Secondary | ICD-10-CM | POA: Diagnosis not present

## 2014-06-23 DIAGNOSIS — G809 Cerebral palsy, unspecified: Secondary | ICD-10-CM | POA: Diagnosis not present

## 2014-06-23 DIAGNOSIS — L309 Dermatitis, unspecified: Secondary | ICD-10-CM | POA: Diagnosis not present

## 2014-06-23 DIAGNOSIS — M25569 Pain in unspecified knee: Secondary | ICD-10-CM | POA: Diagnosis not present

## 2014-06-23 DIAGNOSIS — M1990 Unspecified osteoarthritis, unspecified site: Secondary | ICD-10-CM | POA: Diagnosis not present

## 2014-07-09 DIAGNOSIS — F419 Anxiety disorder, unspecified: Secondary | ICD-10-CM | POA: Diagnosis not present

## 2014-07-13 DIAGNOSIS — M79606 Pain in leg, unspecified: Secondary | ICD-10-CM | POA: Diagnosis not present

## 2014-07-15 DIAGNOSIS — G809 Cerebral palsy, unspecified: Secondary | ICD-10-CM | POA: Diagnosis not present

## 2014-07-15 DIAGNOSIS — M24561 Contracture, right knee: Secondary | ICD-10-CM | POA: Diagnosis not present

## 2014-07-15 DIAGNOSIS — M24562 Contracture, left knee: Secondary | ICD-10-CM | POA: Diagnosis not present

## 2014-07-16 DIAGNOSIS — G809 Cerebral palsy, unspecified: Secondary | ICD-10-CM | POA: Diagnosis not present

## 2014-07-16 DIAGNOSIS — M24562 Contracture, left knee: Secondary | ICD-10-CM | POA: Diagnosis not present

## 2014-07-16 DIAGNOSIS — M24561 Contracture, right knee: Secondary | ICD-10-CM | POA: Diagnosis not present

## 2014-07-17 DIAGNOSIS — M24561 Contracture, right knee: Secondary | ICD-10-CM | POA: Diagnosis not present

## 2014-07-17 DIAGNOSIS — M24562 Contracture, left knee: Secondary | ICD-10-CM | POA: Diagnosis not present

## 2014-07-17 DIAGNOSIS — G809 Cerebral palsy, unspecified: Secondary | ICD-10-CM | POA: Diagnosis not present

## 2014-07-20 DIAGNOSIS — B351 Tinea unguium: Secondary | ICD-10-CM | POA: Diagnosis not present

## 2014-07-20 DIAGNOSIS — G809 Cerebral palsy, unspecified: Secondary | ICD-10-CM | POA: Diagnosis not present

## 2014-07-20 DIAGNOSIS — M79674 Pain in right toe(s): Secondary | ICD-10-CM | POA: Diagnosis not present

## 2014-07-20 DIAGNOSIS — M79675 Pain in left toe(s): Secondary | ICD-10-CM | POA: Diagnosis not present

## 2014-07-20 DIAGNOSIS — M24562 Contracture, left knee: Secondary | ICD-10-CM | POA: Diagnosis not present

## 2014-07-20 DIAGNOSIS — M24561 Contracture, right knee: Secondary | ICD-10-CM | POA: Diagnosis not present

## 2014-07-21 DIAGNOSIS — M24561 Contracture, right knee: Secondary | ICD-10-CM | POA: Diagnosis not present

## 2014-07-21 DIAGNOSIS — M24562 Contracture, left knee: Secondary | ICD-10-CM | POA: Diagnosis not present

## 2014-07-21 DIAGNOSIS — G809 Cerebral palsy, unspecified: Secondary | ICD-10-CM | POA: Diagnosis not present

## 2014-07-22 DIAGNOSIS — G809 Cerebral palsy, unspecified: Secondary | ICD-10-CM | POA: Diagnosis not present

## 2014-07-22 DIAGNOSIS — M24562 Contracture, left knee: Secondary | ICD-10-CM | POA: Diagnosis not present

## 2014-07-22 DIAGNOSIS — M24561 Contracture, right knee: Secondary | ICD-10-CM | POA: Diagnosis not present

## 2014-07-23 DIAGNOSIS — M24562 Contracture, left knee: Secondary | ICD-10-CM | POA: Diagnosis not present

## 2014-07-23 DIAGNOSIS — M24561 Contracture, right knee: Secondary | ICD-10-CM | POA: Diagnosis not present

## 2014-07-23 DIAGNOSIS — G809 Cerebral palsy, unspecified: Secondary | ICD-10-CM | POA: Diagnosis not present

## 2014-07-24 DIAGNOSIS — M24561 Contracture, right knee: Secondary | ICD-10-CM | POA: Diagnosis not present

## 2014-07-24 DIAGNOSIS — M24562 Contracture, left knee: Secondary | ICD-10-CM | POA: Diagnosis not present

## 2014-07-24 DIAGNOSIS — G809 Cerebral palsy, unspecified: Secondary | ICD-10-CM | POA: Diagnosis not present

## 2014-07-26 DIAGNOSIS — M24561 Contracture, right knee: Secondary | ICD-10-CM | POA: Diagnosis not present

## 2014-07-26 DIAGNOSIS — G809 Cerebral palsy, unspecified: Secondary | ICD-10-CM | POA: Diagnosis not present

## 2014-07-26 DIAGNOSIS — M24562 Contracture, left knee: Secondary | ICD-10-CM | POA: Diagnosis not present

## 2014-07-27 DIAGNOSIS — M24561 Contracture, right knee: Secondary | ICD-10-CM | POA: Diagnosis not present

## 2014-07-27 DIAGNOSIS — G809 Cerebral palsy, unspecified: Secondary | ICD-10-CM | POA: Diagnosis not present

## 2014-07-27 DIAGNOSIS — M24562 Contracture, left knee: Secondary | ICD-10-CM | POA: Diagnosis not present

## 2014-07-29 DIAGNOSIS — G809 Cerebral palsy, unspecified: Secondary | ICD-10-CM | POA: Diagnosis not present

## 2014-07-29 DIAGNOSIS — M24562 Contracture, left knee: Secondary | ICD-10-CM | POA: Diagnosis not present

## 2014-07-29 DIAGNOSIS — M24561 Contracture, right knee: Secondary | ICD-10-CM | POA: Diagnosis not present

## 2014-07-30 DIAGNOSIS — M24562 Contracture, left knee: Secondary | ICD-10-CM | POA: Diagnosis not present

## 2014-07-30 DIAGNOSIS — G809 Cerebral palsy, unspecified: Secondary | ICD-10-CM | POA: Diagnosis not present

## 2014-07-30 DIAGNOSIS — N390 Urinary tract infection, site not specified: Secondary | ICD-10-CM | POA: Diagnosis not present

## 2014-07-30 DIAGNOSIS — A047 Enterocolitis due to Clostridium difficile: Secondary | ICD-10-CM | POA: Diagnosis not present

## 2014-07-30 DIAGNOSIS — M24561 Contracture, right knee: Secondary | ICD-10-CM | POA: Diagnosis not present

## 2014-08-02 DIAGNOSIS — G809 Cerebral palsy, unspecified: Secondary | ICD-10-CM | POA: Diagnosis not present

## 2014-08-02 DIAGNOSIS — M24561 Contracture, right knee: Secondary | ICD-10-CM | POA: Diagnosis not present

## 2014-08-02 DIAGNOSIS — M24562 Contracture, left knee: Secondary | ICD-10-CM | POA: Diagnosis not present

## 2014-08-03 DIAGNOSIS — M24561 Contracture, right knee: Secondary | ICD-10-CM | POA: Diagnosis not present

## 2014-08-03 DIAGNOSIS — G809 Cerebral palsy, unspecified: Secondary | ICD-10-CM | POA: Diagnosis not present

## 2014-08-03 DIAGNOSIS — M24562 Contracture, left knee: Secondary | ICD-10-CM | POA: Diagnosis not present

## 2014-08-04 DIAGNOSIS — M24562 Contracture, left knee: Secondary | ICD-10-CM | POA: Diagnosis not present

## 2014-08-04 DIAGNOSIS — M24561 Contracture, right knee: Secondary | ICD-10-CM | POA: Diagnosis not present

## 2014-08-04 DIAGNOSIS — G809 Cerebral palsy, unspecified: Secondary | ICD-10-CM | POA: Diagnosis not present

## 2014-08-05 DIAGNOSIS — M24561 Contracture, right knee: Secondary | ICD-10-CM | POA: Diagnosis not present

## 2014-08-05 DIAGNOSIS — G809 Cerebral palsy, unspecified: Secondary | ICD-10-CM | POA: Diagnosis not present

## 2014-08-05 DIAGNOSIS — M24562 Contracture, left knee: Secondary | ICD-10-CM | POA: Diagnosis not present

## 2014-08-06 DIAGNOSIS — G809 Cerebral palsy, unspecified: Secondary | ICD-10-CM | POA: Diagnosis not present

## 2014-08-06 DIAGNOSIS — M24562 Contracture, left knee: Secondary | ICD-10-CM | POA: Diagnosis not present

## 2014-08-06 DIAGNOSIS — M24561 Contracture, right knee: Secondary | ICD-10-CM | POA: Diagnosis not present

## 2014-08-07 DIAGNOSIS — G809 Cerebral palsy, unspecified: Secondary | ICD-10-CM | POA: Diagnosis not present

## 2014-08-07 DIAGNOSIS — M24561 Contracture, right knee: Secondary | ICD-10-CM | POA: Diagnosis not present

## 2014-08-07 DIAGNOSIS — M24562 Contracture, left knee: Secondary | ICD-10-CM | POA: Diagnosis not present

## 2014-08-08 DIAGNOSIS — M24562 Contracture, left knee: Secondary | ICD-10-CM | POA: Diagnosis not present

## 2014-08-08 DIAGNOSIS — G809 Cerebral palsy, unspecified: Secondary | ICD-10-CM | POA: Diagnosis not present

## 2014-08-08 DIAGNOSIS — M24561 Contracture, right knee: Secondary | ICD-10-CM | POA: Diagnosis not present

## 2014-08-09 DIAGNOSIS — M24562 Contracture, left knee: Secondary | ICD-10-CM | POA: Diagnosis not present

## 2014-08-09 DIAGNOSIS — M24561 Contracture, right knee: Secondary | ICD-10-CM | POA: Diagnosis not present

## 2014-08-09 DIAGNOSIS — G809 Cerebral palsy, unspecified: Secondary | ICD-10-CM | POA: Diagnosis not present

## 2014-08-12 DIAGNOSIS — M24562 Contracture, left knee: Secondary | ICD-10-CM | POA: Diagnosis not present

## 2014-08-12 DIAGNOSIS — G809 Cerebral palsy, unspecified: Secondary | ICD-10-CM | POA: Diagnosis not present

## 2014-08-12 DIAGNOSIS — M24561 Contracture, right knee: Secondary | ICD-10-CM | POA: Diagnosis not present

## 2014-08-13 DIAGNOSIS — M24561 Contracture, right knee: Secondary | ICD-10-CM | POA: Diagnosis not present

## 2014-08-13 DIAGNOSIS — M24562 Contracture, left knee: Secondary | ICD-10-CM | POA: Diagnosis not present

## 2014-08-13 DIAGNOSIS — G809 Cerebral palsy, unspecified: Secondary | ICD-10-CM | POA: Diagnosis not present

## 2014-08-14 DIAGNOSIS — G809 Cerebral palsy, unspecified: Secondary | ICD-10-CM | POA: Diagnosis not present

## 2014-08-14 DIAGNOSIS — M24561 Contracture, right knee: Secondary | ICD-10-CM | POA: Diagnosis not present

## 2014-08-14 DIAGNOSIS — M24562 Contracture, left knee: Secondary | ICD-10-CM | POA: Diagnosis not present

## 2014-08-16 DIAGNOSIS — M24562 Contracture, left knee: Secondary | ICD-10-CM | POA: Diagnosis not present

## 2014-08-16 DIAGNOSIS — G809 Cerebral palsy, unspecified: Secondary | ICD-10-CM | POA: Diagnosis not present

## 2014-08-16 DIAGNOSIS — M24561 Contracture, right knee: Secondary | ICD-10-CM | POA: Diagnosis not present

## 2014-08-17 DIAGNOSIS — M24562 Contracture, left knee: Secondary | ICD-10-CM | POA: Diagnosis not present

## 2014-08-17 DIAGNOSIS — M24561 Contracture, right knee: Secondary | ICD-10-CM | POA: Diagnosis not present

## 2014-08-17 DIAGNOSIS — G809 Cerebral palsy, unspecified: Secondary | ICD-10-CM | POA: Diagnosis not present

## 2014-08-19 DIAGNOSIS — M24562 Contracture, left knee: Secondary | ICD-10-CM | POA: Diagnosis not present

## 2014-08-19 DIAGNOSIS — G809 Cerebral palsy, unspecified: Secondary | ICD-10-CM | POA: Diagnosis not present

## 2014-08-19 DIAGNOSIS — L723 Sebaceous cyst: Secondary | ICD-10-CM | POA: Diagnosis not present

## 2014-08-19 DIAGNOSIS — M24561 Contracture, right knee: Secondary | ICD-10-CM | POA: Diagnosis not present

## 2014-09-03 DIAGNOSIS — I10 Essential (primary) hypertension: Secondary | ICD-10-CM | POA: Diagnosis not present

## 2014-09-03 DIAGNOSIS — E785 Hyperlipidemia, unspecified: Secondary | ICD-10-CM | POA: Diagnosis not present

## 2014-09-03 DIAGNOSIS — G809 Cerebral palsy, unspecified: Secondary | ICD-10-CM | POA: Diagnosis not present

## 2014-09-03 DIAGNOSIS — Z79899 Other long term (current) drug therapy: Secondary | ICD-10-CM | POA: Diagnosis not present

## 2014-09-03 DIAGNOSIS — M25569 Pain in unspecified knee: Secondary | ICD-10-CM | POA: Diagnosis not present

## 2014-09-03 DIAGNOSIS — F0390 Unspecified dementia without behavioral disturbance: Secondary | ICD-10-CM | POA: Diagnosis not present

## 2014-09-03 DIAGNOSIS — K219 Gastro-esophageal reflux disease without esophagitis: Secondary | ICD-10-CM | POA: Diagnosis not present

## 2014-09-03 DIAGNOSIS — D649 Anemia, unspecified: Secondary | ICD-10-CM | POA: Diagnosis not present

## 2014-09-07 DIAGNOSIS — G809 Cerebral palsy, unspecified: Secondary | ICD-10-CM | POA: Diagnosis not present

## 2014-09-07 DIAGNOSIS — R1110 Vomiting, unspecified: Secondary | ICD-10-CM | POA: Diagnosis not present

## 2014-09-07 DIAGNOSIS — K5900 Constipation, unspecified: Secondary | ICD-10-CM | POA: Diagnosis not present

## 2014-09-07 DIAGNOSIS — L723 Sebaceous cyst: Secondary | ICD-10-CM | POA: Diagnosis not present

## 2014-09-17 DIAGNOSIS — L723 Sebaceous cyst: Secondary | ICD-10-CM | POA: Diagnosis not present

## 2014-09-17 DIAGNOSIS — M25569 Pain in unspecified knee: Secondary | ICD-10-CM | POA: Diagnosis not present

## 2014-09-17 DIAGNOSIS — M1990 Unspecified osteoarthritis, unspecified site: Secondary | ICD-10-CM | POA: Diagnosis not present

## 2014-09-17 DIAGNOSIS — R110 Nausea: Secondary | ICD-10-CM | POA: Diagnosis not present

## 2014-10-06 DIAGNOSIS — G809 Cerebral palsy, unspecified: Secondary | ICD-10-CM | POA: Diagnosis not present

## 2014-10-06 DIAGNOSIS — F341 Dysthymic disorder: Secondary | ICD-10-CM | POA: Diagnosis not present

## 2014-10-06 DIAGNOSIS — M1990 Unspecified osteoarthritis, unspecified site: Secondary | ICD-10-CM | POA: Diagnosis not present

## 2014-10-06 DIAGNOSIS — M25569 Pain in unspecified knee: Secondary | ICD-10-CM | POA: Diagnosis not present

## 2014-10-30 DIAGNOSIS — M2012 Hallux valgus (acquired), left foot: Secondary | ICD-10-CM | POA: Diagnosis not present

## 2014-10-30 DIAGNOSIS — M79675 Pain in left toe(s): Secondary | ICD-10-CM | POA: Diagnosis not present

## 2014-10-30 DIAGNOSIS — M79674 Pain in right toe(s): Secondary | ICD-10-CM | POA: Diagnosis not present

## 2014-10-30 DIAGNOSIS — M1990 Unspecified osteoarthritis, unspecified site: Secondary | ICD-10-CM | POA: Diagnosis not present

## 2014-10-30 DIAGNOSIS — B351 Tinea unguium: Secondary | ICD-10-CM | POA: Diagnosis not present

## 2014-10-30 DIAGNOSIS — G809 Cerebral palsy, unspecified: Secondary | ICD-10-CM | POA: Diagnosis not present

## 2014-10-30 DIAGNOSIS — M2011 Hallux valgus (acquired), right foot: Secondary | ICD-10-CM | POA: Diagnosis not present

## 2014-11-04 DIAGNOSIS — M6281 Muscle weakness (generalized): Secondary | ICD-10-CM | POA: Diagnosis not present

## 2014-11-04 DIAGNOSIS — G809 Cerebral palsy, unspecified: Secondary | ICD-10-CM | POA: Diagnosis not present

## 2014-11-04 DIAGNOSIS — M24569 Contracture, unspecified knee: Secondary | ICD-10-CM | POA: Diagnosis not present

## 2014-11-04 DIAGNOSIS — M810 Age-related osteoporosis without current pathological fracture: Secondary | ICD-10-CM | POA: Diagnosis not present

## 2014-11-06 DIAGNOSIS — K219 Gastro-esophageal reflux disease without esophagitis: Secondary | ICD-10-CM | POA: Diagnosis not present

## 2014-11-06 DIAGNOSIS — M25569 Pain in unspecified knee: Secondary | ICD-10-CM | POA: Diagnosis not present

## 2014-11-06 DIAGNOSIS — F329 Major depressive disorder, single episode, unspecified: Secondary | ICD-10-CM | POA: Diagnosis not present

## 2014-11-06 DIAGNOSIS — G809 Cerebral palsy, unspecified: Secondary | ICD-10-CM | POA: Diagnosis not present

## 2014-11-12 DIAGNOSIS — M6281 Muscle weakness (generalized): Secondary | ICD-10-CM | POA: Diagnosis not present

## 2014-11-12 DIAGNOSIS — M810 Age-related osteoporosis without current pathological fracture: Secondary | ICD-10-CM | POA: Diagnosis not present

## 2014-11-12 DIAGNOSIS — M24569 Contracture, unspecified knee: Secondary | ICD-10-CM | POA: Diagnosis not present

## 2014-11-12 DIAGNOSIS — G809 Cerebral palsy, unspecified: Secondary | ICD-10-CM | POA: Diagnosis not present

## 2014-11-13 DIAGNOSIS — G809 Cerebral palsy, unspecified: Secondary | ICD-10-CM | POA: Diagnosis not present

## 2014-11-13 DIAGNOSIS — M6281 Muscle weakness (generalized): Secondary | ICD-10-CM | POA: Diagnosis not present

## 2014-11-13 DIAGNOSIS — M24569 Contracture, unspecified knee: Secondary | ICD-10-CM | POA: Diagnosis not present

## 2014-11-13 DIAGNOSIS — M810 Age-related osteoporosis without current pathological fracture: Secondary | ICD-10-CM | POA: Diagnosis not present

## 2014-11-15 DIAGNOSIS — M6281 Muscle weakness (generalized): Secondary | ICD-10-CM | POA: Diagnosis not present

## 2014-11-15 DIAGNOSIS — M24569 Contracture, unspecified knee: Secondary | ICD-10-CM | POA: Diagnosis not present

## 2014-11-15 DIAGNOSIS — G809 Cerebral palsy, unspecified: Secondary | ICD-10-CM | POA: Diagnosis not present

## 2014-11-15 DIAGNOSIS — M810 Age-related osteoporosis without current pathological fracture: Secondary | ICD-10-CM | POA: Diagnosis not present

## 2014-11-16 DIAGNOSIS — G809 Cerebral palsy, unspecified: Secondary | ICD-10-CM | POA: Diagnosis not present

## 2014-11-16 DIAGNOSIS — M6281 Muscle weakness (generalized): Secondary | ICD-10-CM | POA: Diagnosis not present

## 2014-11-16 DIAGNOSIS — M810 Age-related osteoporosis without current pathological fracture: Secondary | ICD-10-CM | POA: Diagnosis not present

## 2014-11-16 DIAGNOSIS — M24569 Contracture, unspecified knee: Secondary | ICD-10-CM | POA: Diagnosis not present

## 2014-11-17 DIAGNOSIS — G809 Cerebral palsy, unspecified: Secondary | ICD-10-CM | POA: Diagnosis not present

## 2014-11-17 DIAGNOSIS — M6281 Muscle weakness (generalized): Secondary | ICD-10-CM | POA: Diagnosis not present

## 2014-11-17 DIAGNOSIS — M810 Age-related osteoporosis without current pathological fracture: Secondary | ICD-10-CM | POA: Diagnosis not present

## 2014-11-17 DIAGNOSIS — M24569 Contracture, unspecified knee: Secondary | ICD-10-CM | POA: Diagnosis not present

## 2014-11-19 DIAGNOSIS — G809 Cerebral palsy, unspecified: Secondary | ICD-10-CM | POA: Diagnosis not present

## 2014-11-19 DIAGNOSIS — M810 Age-related osteoporosis without current pathological fracture: Secondary | ICD-10-CM | POA: Diagnosis not present

## 2014-11-19 DIAGNOSIS — M6281 Muscle weakness (generalized): Secondary | ICD-10-CM | POA: Diagnosis not present

## 2014-11-19 DIAGNOSIS — M24569 Contracture, unspecified knee: Secondary | ICD-10-CM | POA: Diagnosis not present

## 2015-01-25 DIAGNOSIS — E039 Hypothyroidism, unspecified: Secondary | ICD-10-CM | POA: Diagnosis not present

## 2015-01-29 DIAGNOSIS — M6281 Muscle weakness (generalized): Secondary | ICD-10-CM | POA: Diagnosis not present

## 2015-01-29 DIAGNOSIS — M24561 Contracture, right knee: Secondary | ICD-10-CM | POA: Diagnosis not present

## 2015-01-29 DIAGNOSIS — G809 Cerebral palsy, unspecified: Secondary | ICD-10-CM | POA: Diagnosis not present

## 2015-01-29 DIAGNOSIS — E784 Other hyperlipidemia: Secondary | ICD-10-CM | POA: Diagnosis not present

## 2015-01-29 DIAGNOSIS — M24562 Contracture, left knee: Secondary | ICD-10-CM | POA: Diagnosis not present

## 2015-01-29 DIAGNOSIS — D6489 Other specified anemias: Secondary | ICD-10-CM | POA: Diagnosis not present

## 2015-01-29 DIAGNOSIS — K219 Gastro-esophageal reflux disease without esophagitis: Secondary | ICD-10-CM | POA: Diagnosis not present

## 2015-01-29 DIAGNOSIS — K449 Diaphragmatic hernia without obstruction or gangrene: Secondary | ICD-10-CM | POA: Diagnosis not present

## 2015-01-29 DIAGNOSIS — M810 Age-related osteoporosis without current pathological fracture: Secondary | ICD-10-CM | POA: Diagnosis not present

## 2015-01-29 DIAGNOSIS — E876 Hypokalemia: Secondary | ICD-10-CM | POA: Diagnosis not present

## 2015-03-03 DIAGNOSIS — E789 Disorder of lipoprotein metabolism, unspecified: Secondary | ICD-10-CM | POA: Diagnosis not present

## 2015-03-03 DIAGNOSIS — E559 Vitamin D deficiency, unspecified: Secondary | ICD-10-CM | POA: Diagnosis not present

## 2015-03-03 DIAGNOSIS — D649 Anemia, unspecified: Secondary | ICD-10-CM | POA: Diagnosis not present

## 2015-03-03 DIAGNOSIS — E039 Hypothyroidism, unspecified: Secondary | ICD-10-CM | POA: Diagnosis not present

## 2015-03-03 DIAGNOSIS — Z79899 Other long term (current) drug therapy: Secondary | ICD-10-CM | POA: Diagnosis not present

## 2015-03-05 DIAGNOSIS — M79674 Pain in right toe(s): Secondary | ICD-10-CM | POA: Diagnosis not present

## 2015-03-05 DIAGNOSIS — B351 Tinea unguium: Secondary | ICD-10-CM | POA: Diagnosis not present

## 2015-03-05 DIAGNOSIS — M79675 Pain in left toe(s): Secondary | ICD-10-CM | POA: Diagnosis not present

## 2015-03-05 DIAGNOSIS — I70203 Unspecified atherosclerosis of native arteries of extremities, bilateral legs: Secondary | ICD-10-CM | POA: Diagnosis not present

## 2015-03-08 DIAGNOSIS — Z961 Presence of intraocular lens: Secondary | ICD-10-CM | POA: Diagnosis not present

## 2015-03-08 DIAGNOSIS — G809 Cerebral palsy, unspecified: Secondary | ICD-10-CM | POA: Diagnosis not present

## 2015-04-19 DIAGNOSIS — Z8719 Personal history of other diseases of the digestive system: Secondary | ICD-10-CM | POA: Diagnosis not present

## 2015-04-19 DIAGNOSIS — G809 Cerebral palsy, unspecified: Secondary | ICD-10-CM | POA: Diagnosis not present

## 2015-04-19 DIAGNOSIS — K219 Gastro-esophageal reflux disease without esophagitis: Secondary | ICD-10-CM | POA: Diagnosis not present

## 2015-04-19 DIAGNOSIS — F3340 Major depressive disorder, recurrent, in remission, unspecified: Secondary | ICD-10-CM | POA: Diagnosis not present

## 2015-04-19 DIAGNOSIS — H05229 Edema of unspecified orbit: Secondary | ICD-10-CM | POA: Diagnosis not present

## 2015-04-19 DIAGNOSIS — M25569 Pain in unspecified knee: Secondary | ICD-10-CM | POA: Diagnosis not present

## 2015-04-26 DIAGNOSIS — G809 Cerebral palsy, unspecified: Secondary | ICD-10-CM | POA: Diagnosis not present

## 2015-04-26 DIAGNOSIS — M24569 Contracture, unspecified knee: Secondary | ICD-10-CM | POA: Diagnosis not present

## 2015-04-26 DIAGNOSIS — M6281 Muscle weakness (generalized): Secondary | ICD-10-CM | POA: Diagnosis not present

## 2015-04-26 DIAGNOSIS — M810 Age-related osteoporosis without current pathological fracture: Secondary | ICD-10-CM | POA: Diagnosis not present

## 2015-04-27 DIAGNOSIS — M24569 Contracture, unspecified knee: Secondary | ICD-10-CM | POA: Diagnosis not present

## 2015-04-27 DIAGNOSIS — G809 Cerebral palsy, unspecified: Secondary | ICD-10-CM | POA: Diagnosis not present

## 2015-04-27 DIAGNOSIS — M6281 Muscle weakness (generalized): Secondary | ICD-10-CM | POA: Diagnosis not present

## 2015-04-27 DIAGNOSIS — M810 Age-related osteoporosis without current pathological fracture: Secondary | ICD-10-CM | POA: Diagnosis not present

## 2015-04-28 DIAGNOSIS — M6281 Muscle weakness (generalized): Secondary | ICD-10-CM | POA: Diagnosis not present

## 2015-04-28 DIAGNOSIS — G809 Cerebral palsy, unspecified: Secondary | ICD-10-CM | POA: Diagnosis not present

## 2015-04-28 DIAGNOSIS — M24569 Contracture, unspecified knee: Secondary | ICD-10-CM | POA: Diagnosis not present

## 2015-04-28 DIAGNOSIS — M810 Age-related osteoporosis without current pathological fracture: Secondary | ICD-10-CM | POA: Diagnosis not present

## 2015-04-29 DIAGNOSIS — G809 Cerebral palsy, unspecified: Secondary | ICD-10-CM | POA: Diagnosis not present

## 2015-04-29 DIAGNOSIS — M810 Age-related osteoporosis without current pathological fracture: Secondary | ICD-10-CM | POA: Diagnosis not present

## 2015-04-29 DIAGNOSIS — M6281 Muscle weakness (generalized): Secondary | ICD-10-CM | POA: Diagnosis not present

## 2015-04-29 DIAGNOSIS — M24569 Contracture, unspecified knee: Secondary | ICD-10-CM | POA: Diagnosis not present

## 2015-04-30 DIAGNOSIS — M6281 Muscle weakness (generalized): Secondary | ICD-10-CM | POA: Diagnosis not present

## 2015-04-30 DIAGNOSIS — M24569 Contracture, unspecified knee: Secondary | ICD-10-CM | POA: Diagnosis not present

## 2015-04-30 DIAGNOSIS — G809 Cerebral palsy, unspecified: Secondary | ICD-10-CM | POA: Diagnosis not present

## 2015-04-30 DIAGNOSIS — M810 Age-related osteoporosis without current pathological fracture: Secondary | ICD-10-CM | POA: Diagnosis not present

## 2015-05-01 DIAGNOSIS — M24562 Contracture, left knee: Secondary | ICD-10-CM | POA: Diagnosis not present

## 2015-05-01 DIAGNOSIS — G809 Cerebral palsy, unspecified: Secondary | ICD-10-CM | POA: Diagnosis not present

## 2015-05-01 DIAGNOSIS — M24561 Contracture, right knee: Secondary | ICD-10-CM | POA: Diagnosis not present

## 2015-05-03 DIAGNOSIS — G809 Cerebral palsy, unspecified: Secondary | ICD-10-CM | POA: Diagnosis not present

## 2015-05-03 DIAGNOSIS — M24561 Contracture, right knee: Secondary | ICD-10-CM | POA: Diagnosis not present

## 2015-05-03 DIAGNOSIS — M24562 Contracture, left knee: Secondary | ICD-10-CM | POA: Diagnosis not present

## 2015-05-04 DIAGNOSIS — G809 Cerebral palsy, unspecified: Secondary | ICD-10-CM | POA: Diagnosis not present

## 2015-05-04 DIAGNOSIS — M24562 Contracture, left knee: Secondary | ICD-10-CM | POA: Diagnosis not present

## 2015-05-04 DIAGNOSIS — M24561 Contracture, right knee: Secondary | ICD-10-CM | POA: Diagnosis not present

## 2015-05-05 DIAGNOSIS — M24562 Contracture, left knee: Secondary | ICD-10-CM | POA: Diagnosis not present

## 2015-05-05 DIAGNOSIS — G809 Cerebral palsy, unspecified: Secondary | ICD-10-CM | POA: Diagnosis not present

## 2015-05-05 DIAGNOSIS — M24561 Contracture, right knee: Secondary | ICD-10-CM | POA: Diagnosis not present

## 2015-05-06 DIAGNOSIS — M24562 Contracture, left knee: Secondary | ICD-10-CM | POA: Diagnosis not present

## 2015-05-06 DIAGNOSIS — M24561 Contracture, right knee: Secondary | ICD-10-CM | POA: Diagnosis not present

## 2015-05-06 DIAGNOSIS — G809 Cerebral palsy, unspecified: Secondary | ICD-10-CM | POA: Diagnosis not present

## 2015-05-07 DIAGNOSIS — M24562 Contracture, left knee: Secondary | ICD-10-CM | POA: Diagnosis not present

## 2015-05-07 DIAGNOSIS — M24561 Contracture, right knee: Secondary | ICD-10-CM | POA: Diagnosis not present

## 2015-05-07 DIAGNOSIS — G809 Cerebral palsy, unspecified: Secondary | ICD-10-CM | POA: Diagnosis not present

## 2015-05-10 DIAGNOSIS — G809 Cerebral palsy, unspecified: Secondary | ICD-10-CM | POA: Diagnosis not present

## 2015-05-10 DIAGNOSIS — M24562 Contracture, left knee: Secondary | ICD-10-CM | POA: Diagnosis not present

## 2015-05-10 DIAGNOSIS — M24561 Contracture, right knee: Secondary | ICD-10-CM | POA: Diagnosis not present

## 2015-05-11 DIAGNOSIS — M24562 Contracture, left knee: Secondary | ICD-10-CM | POA: Diagnosis not present

## 2015-05-11 DIAGNOSIS — M24561 Contracture, right knee: Secondary | ICD-10-CM | POA: Diagnosis not present

## 2015-05-11 DIAGNOSIS — G809 Cerebral palsy, unspecified: Secondary | ICD-10-CM | POA: Diagnosis not present

## 2015-05-12 DIAGNOSIS — M24562 Contracture, left knee: Secondary | ICD-10-CM | POA: Diagnosis not present

## 2015-05-12 DIAGNOSIS — G809 Cerebral palsy, unspecified: Secondary | ICD-10-CM | POA: Diagnosis not present

## 2015-05-12 DIAGNOSIS — M24561 Contracture, right knee: Secondary | ICD-10-CM | POA: Diagnosis not present

## 2015-05-13 DIAGNOSIS — G809 Cerebral palsy, unspecified: Secondary | ICD-10-CM | POA: Diagnosis not present

## 2015-05-13 DIAGNOSIS — M24562 Contracture, left knee: Secondary | ICD-10-CM | POA: Diagnosis not present

## 2015-05-13 DIAGNOSIS — M24561 Contracture, right knee: Secondary | ICD-10-CM | POA: Diagnosis not present

## 2015-05-14 DIAGNOSIS — M79671 Pain in right foot: Secondary | ICD-10-CM | POA: Diagnosis not present

## 2015-05-14 DIAGNOSIS — G809 Cerebral palsy, unspecified: Secondary | ICD-10-CM | POA: Diagnosis not present

## 2015-05-14 DIAGNOSIS — M79672 Pain in left foot: Secondary | ICD-10-CM | POA: Diagnosis not present

## 2015-05-14 DIAGNOSIS — B351 Tinea unguium: Secondary | ICD-10-CM | POA: Diagnosis not present

## 2015-05-14 DIAGNOSIS — M24562 Contracture, left knee: Secondary | ICD-10-CM | POA: Diagnosis not present

## 2015-05-14 DIAGNOSIS — M24561 Contracture, right knee: Secondary | ICD-10-CM | POA: Diagnosis not present

## 2015-05-17 DIAGNOSIS — M24562 Contracture, left knee: Secondary | ICD-10-CM | POA: Diagnosis not present

## 2015-05-17 DIAGNOSIS — M24561 Contracture, right knee: Secondary | ICD-10-CM | POA: Diagnosis not present

## 2015-05-17 DIAGNOSIS — G809 Cerebral palsy, unspecified: Secondary | ICD-10-CM | POA: Diagnosis not present

## 2015-05-18 DIAGNOSIS — G809 Cerebral palsy, unspecified: Secondary | ICD-10-CM | POA: Diagnosis not present

## 2015-05-18 DIAGNOSIS — M24562 Contracture, left knee: Secondary | ICD-10-CM | POA: Diagnosis not present

## 2015-05-18 DIAGNOSIS — M24561 Contracture, right knee: Secondary | ICD-10-CM | POA: Diagnosis not present

## 2015-05-19 DIAGNOSIS — M24562 Contracture, left knee: Secondary | ICD-10-CM | POA: Diagnosis not present

## 2015-05-19 DIAGNOSIS — M24561 Contracture, right knee: Secondary | ICD-10-CM | POA: Diagnosis not present

## 2015-05-19 DIAGNOSIS — G809 Cerebral palsy, unspecified: Secondary | ICD-10-CM | POA: Diagnosis not present

## 2015-05-20 DIAGNOSIS — M24562 Contracture, left knee: Secondary | ICD-10-CM | POA: Diagnosis not present

## 2015-05-20 DIAGNOSIS — G809 Cerebral palsy, unspecified: Secondary | ICD-10-CM | POA: Diagnosis not present

## 2015-05-20 DIAGNOSIS — M24561 Contracture, right knee: Secondary | ICD-10-CM | POA: Diagnosis not present

## 2015-05-21 DIAGNOSIS — G809 Cerebral palsy, unspecified: Secondary | ICD-10-CM | POA: Diagnosis not present

## 2015-05-21 DIAGNOSIS — M24561 Contracture, right knee: Secondary | ICD-10-CM | POA: Diagnosis not present

## 2015-05-21 DIAGNOSIS — M24562 Contracture, left knee: Secondary | ICD-10-CM | POA: Diagnosis not present

## 2015-07-01 DIAGNOSIS — Z8719 Personal history of other diseases of the digestive system: Secondary | ICD-10-CM | POA: Diagnosis not present

## 2015-07-01 DIAGNOSIS — K219 Gastro-esophageal reflux disease without esophagitis: Secondary | ICD-10-CM | POA: Diagnosis not present

## 2015-07-01 DIAGNOSIS — H05229 Edema of unspecified orbit: Secondary | ICD-10-CM | POA: Diagnosis not present

## 2015-07-01 DIAGNOSIS — M25569 Pain in unspecified knee: Secondary | ICD-10-CM | POA: Diagnosis not present

## 2015-07-01 DIAGNOSIS — F3340 Major depressive disorder, recurrent, in remission, unspecified: Secondary | ICD-10-CM | POA: Diagnosis not present

## 2015-07-01 DIAGNOSIS — G809 Cerebral palsy, unspecified: Secondary | ICD-10-CM | POA: Diagnosis not present

## 2015-08-04 DIAGNOSIS — G809 Cerebral palsy, unspecified: Secondary | ICD-10-CM | POA: Diagnosis not present

## 2015-08-04 DIAGNOSIS — M24562 Contracture, left knee: Secondary | ICD-10-CM | POA: Diagnosis not present

## 2015-08-04 DIAGNOSIS — M24561 Contracture, right knee: Secondary | ICD-10-CM | POA: Diagnosis not present

## 2015-08-05 DIAGNOSIS — M24562 Contracture, left knee: Secondary | ICD-10-CM | POA: Diagnosis not present

## 2015-08-05 DIAGNOSIS — G809 Cerebral palsy, unspecified: Secondary | ICD-10-CM | POA: Diagnosis not present

## 2015-08-05 DIAGNOSIS — M24561 Contracture, right knee: Secondary | ICD-10-CM | POA: Diagnosis not present

## 2015-08-06 DIAGNOSIS — B351 Tinea unguium: Secondary | ICD-10-CM | POA: Diagnosis not present

## 2015-08-06 DIAGNOSIS — M24562 Contracture, left knee: Secondary | ICD-10-CM | POA: Diagnosis not present

## 2015-08-06 DIAGNOSIS — M79672 Pain in left foot: Secondary | ICD-10-CM | POA: Diagnosis not present

## 2015-08-06 DIAGNOSIS — I2510 Atherosclerotic heart disease of native coronary artery without angina pectoris: Secondary | ICD-10-CM | POA: Diagnosis not present

## 2015-08-06 DIAGNOSIS — G809 Cerebral palsy, unspecified: Secondary | ICD-10-CM | POA: Diagnosis not present

## 2015-08-06 DIAGNOSIS — M24561 Contracture, right knee: Secondary | ICD-10-CM | POA: Diagnosis not present

## 2015-08-06 DIAGNOSIS — M79671 Pain in right foot: Secondary | ICD-10-CM | POA: Diagnosis not present

## 2015-08-08 DIAGNOSIS — G809 Cerebral palsy, unspecified: Secondary | ICD-10-CM | POA: Diagnosis not present

## 2015-08-08 DIAGNOSIS — M24562 Contracture, left knee: Secondary | ICD-10-CM | POA: Diagnosis not present

## 2015-08-08 DIAGNOSIS — M24561 Contracture, right knee: Secondary | ICD-10-CM | POA: Diagnosis not present

## 2015-08-09 DIAGNOSIS — M24561 Contracture, right knee: Secondary | ICD-10-CM | POA: Diagnosis not present

## 2015-08-09 DIAGNOSIS — G809 Cerebral palsy, unspecified: Secondary | ICD-10-CM | POA: Diagnosis not present

## 2015-08-09 DIAGNOSIS — M24562 Contracture, left knee: Secondary | ICD-10-CM | POA: Diagnosis not present

## 2015-08-11 DIAGNOSIS — G809 Cerebral palsy, unspecified: Secondary | ICD-10-CM | POA: Diagnosis not present

## 2015-08-11 DIAGNOSIS — M24562 Contracture, left knee: Secondary | ICD-10-CM | POA: Diagnosis not present

## 2015-08-11 DIAGNOSIS — M24561 Contracture, right knee: Secondary | ICD-10-CM | POA: Diagnosis not present

## 2015-08-12 DIAGNOSIS — G809 Cerebral palsy, unspecified: Secondary | ICD-10-CM | POA: Diagnosis not present

## 2015-08-12 DIAGNOSIS — M24561 Contracture, right knee: Secondary | ICD-10-CM | POA: Diagnosis not present

## 2015-08-12 DIAGNOSIS — M24562 Contracture, left knee: Secondary | ICD-10-CM | POA: Diagnosis not present

## 2015-08-13 DIAGNOSIS — G809 Cerebral palsy, unspecified: Secondary | ICD-10-CM | POA: Diagnosis not present

## 2015-08-13 DIAGNOSIS — M24562 Contracture, left knee: Secondary | ICD-10-CM | POA: Diagnosis not present

## 2015-08-13 DIAGNOSIS — M24561 Contracture, right knee: Secondary | ICD-10-CM | POA: Diagnosis not present

## 2015-08-16 DIAGNOSIS — M24561 Contracture, right knee: Secondary | ICD-10-CM | POA: Diagnosis not present

## 2015-08-16 DIAGNOSIS — G809 Cerebral palsy, unspecified: Secondary | ICD-10-CM | POA: Diagnosis not present

## 2015-08-16 DIAGNOSIS — M24562 Contracture, left knee: Secondary | ICD-10-CM | POA: Diagnosis not present

## 2015-08-17 DIAGNOSIS — G809 Cerebral palsy, unspecified: Secondary | ICD-10-CM | POA: Diagnosis not present

## 2015-08-17 DIAGNOSIS — M24562 Contracture, left knee: Secondary | ICD-10-CM | POA: Diagnosis not present

## 2015-08-17 DIAGNOSIS — M24561 Contracture, right knee: Secondary | ICD-10-CM | POA: Diagnosis not present

## 2015-08-18 DIAGNOSIS — M24562 Contracture, left knee: Secondary | ICD-10-CM | POA: Diagnosis not present

## 2015-08-18 DIAGNOSIS — G809 Cerebral palsy, unspecified: Secondary | ICD-10-CM | POA: Diagnosis not present

## 2015-08-18 DIAGNOSIS — M24561 Contracture, right knee: Secondary | ICD-10-CM | POA: Diagnosis not present

## 2015-08-19 DIAGNOSIS — G809 Cerebral palsy, unspecified: Secondary | ICD-10-CM | POA: Diagnosis not present

## 2015-08-19 DIAGNOSIS — M24561 Contracture, right knee: Secondary | ICD-10-CM | POA: Diagnosis not present

## 2015-08-19 DIAGNOSIS — M24562 Contracture, left knee: Secondary | ICD-10-CM | POA: Diagnosis not present

## 2015-08-20 DIAGNOSIS — M24561 Contracture, right knee: Secondary | ICD-10-CM | POA: Diagnosis not present

## 2015-08-20 DIAGNOSIS — M24562 Contracture, left knee: Secondary | ICD-10-CM | POA: Diagnosis not present

## 2015-08-20 DIAGNOSIS — G809 Cerebral palsy, unspecified: Secondary | ICD-10-CM | POA: Diagnosis not present

## 2015-08-23 DIAGNOSIS — M24561 Contracture, right knee: Secondary | ICD-10-CM | POA: Diagnosis not present

## 2015-08-23 DIAGNOSIS — M24562 Contracture, left knee: Secondary | ICD-10-CM | POA: Diagnosis not present

## 2015-08-23 DIAGNOSIS — G809 Cerebral palsy, unspecified: Secondary | ICD-10-CM | POA: Diagnosis not present

## 2015-08-24 DIAGNOSIS — M24561 Contracture, right knee: Secondary | ICD-10-CM | POA: Diagnosis not present

## 2015-08-24 DIAGNOSIS — M24562 Contracture, left knee: Secondary | ICD-10-CM | POA: Diagnosis not present

## 2015-08-24 DIAGNOSIS — G809 Cerebral palsy, unspecified: Secondary | ICD-10-CM | POA: Diagnosis not present

## 2015-08-25 DIAGNOSIS — M24561 Contracture, right knee: Secondary | ICD-10-CM | POA: Diagnosis not present

## 2015-08-25 DIAGNOSIS — G809 Cerebral palsy, unspecified: Secondary | ICD-10-CM | POA: Diagnosis not present

## 2015-08-25 DIAGNOSIS — M24562 Contracture, left knee: Secondary | ICD-10-CM | POA: Diagnosis not present

## 2015-08-26 DIAGNOSIS — M24561 Contracture, right knee: Secondary | ICD-10-CM | POA: Diagnosis not present

## 2015-08-26 DIAGNOSIS — G809 Cerebral palsy, unspecified: Secondary | ICD-10-CM | POA: Diagnosis not present

## 2015-08-26 DIAGNOSIS — M24562 Contracture, left knee: Secondary | ICD-10-CM | POA: Diagnosis not present

## 2015-08-27 DIAGNOSIS — M24562 Contracture, left knee: Secondary | ICD-10-CM | POA: Diagnosis not present

## 2015-08-27 DIAGNOSIS — G809 Cerebral palsy, unspecified: Secondary | ICD-10-CM | POA: Diagnosis not present

## 2015-08-27 DIAGNOSIS — K219 Gastro-esophageal reflux disease without esophagitis: Secondary | ICD-10-CM | POA: Diagnosis not present

## 2015-08-27 DIAGNOSIS — M24561 Contracture, right knee: Secondary | ICD-10-CM | POA: Diagnosis not present

## 2015-08-27 DIAGNOSIS — Z8719 Personal history of other diseases of the digestive system: Secondary | ICD-10-CM | POA: Diagnosis not present

## 2015-08-27 DIAGNOSIS — H05229 Edema of unspecified orbit: Secondary | ICD-10-CM | POA: Diagnosis not present

## 2015-08-27 DIAGNOSIS — F3340 Major depressive disorder, recurrent, in remission, unspecified: Secondary | ICD-10-CM | POA: Diagnosis not present

## 2015-08-30 DIAGNOSIS — M24561 Contracture, right knee: Secondary | ICD-10-CM | POA: Diagnosis not present

## 2015-08-30 DIAGNOSIS — M24562 Contracture, left knee: Secondary | ICD-10-CM | POA: Diagnosis not present

## 2015-08-30 DIAGNOSIS — G809 Cerebral palsy, unspecified: Secondary | ICD-10-CM | POA: Diagnosis not present

## 2015-08-31 DIAGNOSIS — M24561 Contracture, right knee: Secondary | ICD-10-CM | POA: Diagnosis not present

## 2015-08-31 DIAGNOSIS — N390 Urinary tract infection, site not specified: Secondary | ICD-10-CM | POA: Diagnosis not present

## 2015-08-31 DIAGNOSIS — M24562 Contracture, left knee: Secondary | ICD-10-CM | POA: Diagnosis not present

## 2015-08-31 DIAGNOSIS — Z79899 Other long term (current) drug therapy: Secondary | ICD-10-CM | POA: Diagnosis not present

## 2015-08-31 DIAGNOSIS — G809 Cerebral palsy, unspecified: Secondary | ICD-10-CM | POA: Diagnosis not present

## 2015-09-01 DIAGNOSIS — G809 Cerebral palsy, unspecified: Secondary | ICD-10-CM | POA: Diagnosis not present

## 2015-09-01 DIAGNOSIS — M24561 Contracture, right knee: Secondary | ICD-10-CM | POA: Diagnosis not present

## 2015-09-01 DIAGNOSIS — R1311 Dysphagia, oral phase: Secondary | ICD-10-CM | POA: Diagnosis not present

## 2015-09-01 DIAGNOSIS — M24562 Contracture, left knee: Secondary | ICD-10-CM | POA: Diagnosis not present

## 2015-09-02 DIAGNOSIS — G809 Cerebral palsy, unspecified: Secondary | ICD-10-CM | POA: Diagnosis not present

## 2015-09-02 DIAGNOSIS — M24562 Contracture, left knee: Secondary | ICD-10-CM | POA: Diagnosis not present

## 2015-09-02 DIAGNOSIS — M24561 Contracture, right knee: Secondary | ICD-10-CM | POA: Diagnosis not present

## 2015-09-02 DIAGNOSIS — R1311 Dysphagia, oral phase: Secondary | ICD-10-CM | POA: Diagnosis not present

## 2015-09-03 DIAGNOSIS — M24562 Contracture, left knee: Secondary | ICD-10-CM | POA: Diagnosis not present

## 2015-09-03 DIAGNOSIS — G809 Cerebral palsy, unspecified: Secondary | ICD-10-CM | POA: Diagnosis not present

## 2015-09-03 DIAGNOSIS — M24561 Contracture, right knee: Secondary | ICD-10-CM | POA: Diagnosis not present

## 2015-09-03 DIAGNOSIS — R1311 Dysphagia, oral phase: Secondary | ICD-10-CM | POA: Diagnosis not present

## 2015-09-04 DIAGNOSIS — R1311 Dysphagia, oral phase: Secondary | ICD-10-CM | POA: Diagnosis not present

## 2015-09-04 DIAGNOSIS — M24561 Contracture, right knee: Secondary | ICD-10-CM | POA: Diagnosis not present

## 2015-09-04 DIAGNOSIS — M24562 Contracture, left knee: Secondary | ICD-10-CM | POA: Diagnosis not present

## 2015-09-04 DIAGNOSIS — G809 Cerebral palsy, unspecified: Secondary | ICD-10-CM | POA: Diagnosis not present

## 2015-09-05 DIAGNOSIS — R0989 Other specified symptoms and signs involving the circulatory and respiratory systems: Secondary | ICD-10-CM | POA: Diagnosis not present

## 2015-09-05 DIAGNOSIS — R05 Cough: Secondary | ICD-10-CM | POA: Diagnosis not present

## 2015-09-05 DIAGNOSIS — D649 Anemia, unspecified: Secondary | ICD-10-CM | POA: Diagnosis not present

## 2015-09-06 DIAGNOSIS — M24562 Contracture, left knee: Secondary | ICD-10-CM | POA: Diagnosis not present

## 2015-09-06 DIAGNOSIS — E785 Hyperlipidemia, unspecified: Secondary | ICD-10-CM | POA: Diagnosis not present

## 2015-09-06 DIAGNOSIS — R1311 Dysphagia, oral phase: Secondary | ICD-10-CM | POA: Diagnosis not present

## 2015-09-06 DIAGNOSIS — G809 Cerebral palsy, unspecified: Secondary | ICD-10-CM | POA: Diagnosis not present

## 2015-09-06 DIAGNOSIS — I10 Essential (primary) hypertension: Secondary | ICD-10-CM | POA: Diagnosis not present

## 2015-09-06 DIAGNOSIS — D649 Anemia, unspecified: Secondary | ICD-10-CM | POA: Diagnosis not present

## 2015-09-06 DIAGNOSIS — M24561 Contracture, right knee: Secondary | ICD-10-CM | POA: Diagnosis not present

## 2015-09-15 DIAGNOSIS — R1311 Dysphagia, oral phase: Secondary | ICD-10-CM | POA: Diagnosis not present

## 2015-09-15 DIAGNOSIS — M24562 Contracture, left knee: Secondary | ICD-10-CM | POA: Diagnosis not present

## 2015-09-15 DIAGNOSIS — G809 Cerebral palsy, unspecified: Secondary | ICD-10-CM | POA: Diagnosis not present

## 2015-09-15 DIAGNOSIS — M24561 Contracture, right knee: Secondary | ICD-10-CM | POA: Diagnosis not present

## 2015-09-15 DIAGNOSIS — N390 Urinary tract infection, site not specified: Secondary | ICD-10-CM | POA: Diagnosis not present

## 2015-09-16 DIAGNOSIS — R1311 Dysphagia, oral phase: Secondary | ICD-10-CM | POA: Diagnosis not present

## 2015-09-16 DIAGNOSIS — M24561 Contracture, right knee: Secondary | ICD-10-CM | POA: Diagnosis not present

## 2015-09-16 DIAGNOSIS — G809 Cerebral palsy, unspecified: Secondary | ICD-10-CM | POA: Diagnosis not present

## 2015-09-16 DIAGNOSIS — M24562 Contracture, left knee: Secondary | ICD-10-CM | POA: Diagnosis not present

## 2015-09-17 DIAGNOSIS — G809 Cerebral palsy, unspecified: Secondary | ICD-10-CM | POA: Diagnosis not present

## 2015-09-17 DIAGNOSIS — R110 Nausea: Secondary | ICD-10-CM | POA: Diagnosis not present

## 2015-09-17 DIAGNOSIS — K219 Gastro-esophageal reflux disease without esophagitis: Secondary | ICD-10-CM | POA: Diagnosis not present

## 2015-09-17 DIAGNOSIS — R1311 Dysphagia, oral phase: Secondary | ICD-10-CM | POA: Diagnosis not present

## 2015-09-17 DIAGNOSIS — M24562 Contracture, left knee: Secondary | ICD-10-CM | POA: Diagnosis not present

## 2015-09-17 DIAGNOSIS — M24561 Contracture, right knee: Secondary | ICD-10-CM | POA: Diagnosis not present

## 2015-09-20 DIAGNOSIS — M24561 Contracture, right knee: Secondary | ICD-10-CM | POA: Diagnosis not present

## 2015-09-20 DIAGNOSIS — G809 Cerebral palsy, unspecified: Secondary | ICD-10-CM | POA: Diagnosis not present

## 2015-09-20 DIAGNOSIS — R1311 Dysphagia, oral phase: Secondary | ICD-10-CM | POA: Diagnosis not present

## 2015-09-20 DIAGNOSIS — M24562 Contracture, left knee: Secondary | ICD-10-CM | POA: Diagnosis not present

## 2015-09-21 DIAGNOSIS — M24562 Contracture, left knee: Secondary | ICD-10-CM | POA: Diagnosis not present

## 2015-09-21 DIAGNOSIS — G809 Cerebral palsy, unspecified: Secondary | ICD-10-CM | POA: Diagnosis not present

## 2015-09-21 DIAGNOSIS — M24561 Contracture, right knee: Secondary | ICD-10-CM | POA: Diagnosis not present

## 2015-09-21 DIAGNOSIS — R1311 Dysphagia, oral phase: Secondary | ICD-10-CM | POA: Diagnosis not present

## 2015-09-22 DIAGNOSIS — M24562 Contracture, left knee: Secondary | ICD-10-CM | POA: Diagnosis not present

## 2015-09-22 DIAGNOSIS — M24561 Contracture, right knee: Secondary | ICD-10-CM | POA: Diagnosis not present

## 2015-09-22 DIAGNOSIS — G809 Cerebral palsy, unspecified: Secondary | ICD-10-CM | POA: Diagnosis not present

## 2015-09-22 DIAGNOSIS — R1311 Dysphagia, oral phase: Secondary | ICD-10-CM | POA: Diagnosis not present

## 2015-09-23 DIAGNOSIS — M24561 Contracture, right knee: Secondary | ICD-10-CM | POA: Diagnosis not present

## 2015-09-23 DIAGNOSIS — M24562 Contracture, left knee: Secondary | ICD-10-CM | POA: Diagnosis not present

## 2015-09-23 DIAGNOSIS — R1311 Dysphagia, oral phase: Secondary | ICD-10-CM | POA: Diagnosis not present

## 2015-09-23 DIAGNOSIS — G809 Cerebral palsy, unspecified: Secondary | ICD-10-CM | POA: Diagnosis not present

## 2015-09-24 DIAGNOSIS — M24562 Contracture, left knee: Secondary | ICD-10-CM | POA: Diagnosis not present

## 2015-09-24 DIAGNOSIS — M24561 Contracture, right knee: Secondary | ICD-10-CM | POA: Diagnosis not present

## 2015-09-24 DIAGNOSIS — G809 Cerebral palsy, unspecified: Secondary | ICD-10-CM | POA: Diagnosis not present

## 2015-09-24 DIAGNOSIS — R1311 Dysphagia, oral phase: Secondary | ICD-10-CM | POA: Diagnosis not present

## 2015-09-27 DIAGNOSIS — M24561 Contracture, right knee: Secondary | ICD-10-CM | POA: Diagnosis not present

## 2015-09-27 DIAGNOSIS — G809 Cerebral palsy, unspecified: Secondary | ICD-10-CM | POA: Diagnosis not present

## 2015-09-27 DIAGNOSIS — M24562 Contracture, left knee: Secondary | ICD-10-CM | POA: Diagnosis not present

## 2015-09-27 DIAGNOSIS — R1311 Dysphagia, oral phase: Secondary | ICD-10-CM | POA: Diagnosis not present

## 2015-09-28 DIAGNOSIS — R110 Nausea: Secondary | ICD-10-CM | POA: Diagnosis not present

## 2015-09-28 DIAGNOSIS — R1311 Dysphagia, oral phase: Secondary | ICD-10-CM | POA: Diagnosis not present

## 2015-09-28 DIAGNOSIS — M24561 Contracture, right knee: Secondary | ICD-10-CM | POA: Diagnosis not present

## 2015-09-28 DIAGNOSIS — G809 Cerebral palsy, unspecified: Secondary | ICD-10-CM | POA: Diagnosis not present

## 2015-09-28 DIAGNOSIS — M24562 Contracture, left knee: Secondary | ICD-10-CM | POA: Diagnosis not present

## 2015-09-28 DIAGNOSIS — K219 Gastro-esophageal reflux disease without esophagitis: Secondary | ICD-10-CM | POA: Diagnosis not present

## 2015-09-29 DIAGNOSIS — R1311 Dysphagia, oral phase: Secondary | ICD-10-CM | POA: Diagnosis not present

## 2015-09-29 DIAGNOSIS — G809 Cerebral palsy, unspecified: Secondary | ICD-10-CM | POA: Diagnosis not present

## 2015-09-29 DIAGNOSIS — M24562 Contracture, left knee: Secondary | ICD-10-CM | POA: Diagnosis not present

## 2015-09-29 DIAGNOSIS — M24561 Contracture, right knee: Secondary | ICD-10-CM | POA: Diagnosis not present

## 2015-09-30 DIAGNOSIS — M24561 Contracture, right knee: Secondary | ICD-10-CM | POA: Diagnosis not present

## 2015-09-30 DIAGNOSIS — R110 Nausea: Secondary | ICD-10-CM | POA: Diagnosis not present

## 2015-09-30 DIAGNOSIS — R1311 Dysphagia, oral phase: Secondary | ICD-10-CM | POA: Diagnosis not present

## 2015-09-30 DIAGNOSIS — M24562 Contracture, left knee: Secondary | ICD-10-CM | POA: Diagnosis not present

## 2015-09-30 DIAGNOSIS — G809 Cerebral palsy, unspecified: Secondary | ICD-10-CM | POA: Diagnosis not present

## 2015-09-30 DIAGNOSIS — M1990 Unspecified osteoarthritis, unspecified site: Secondary | ICD-10-CM | POA: Diagnosis not present

## 2015-10-01 DIAGNOSIS — R1311 Dysphagia, oral phase: Secondary | ICD-10-CM | POA: Diagnosis not present

## 2015-10-01 DIAGNOSIS — M24561 Contracture, right knee: Secondary | ICD-10-CM | POA: Diagnosis not present

## 2015-10-01 DIAGNOSIS — G809 Cerebral palsy, unspecified: Secondary | ICD-10-CM | POA: Diagnosis not present

## 2015-10-01 DIAGNOSIS — M24562 Contracture, left knee: Secondary | ICD-10-CM | POA: Diagnosis not present

## 2015-10-04 DIAGNOSIS — G809 Cerebral palsy, unspecified: Secondary | ICD-10-CM | POA: Diagnosis not present

## 2015-10-04 DIAGNOSIS — M24562 Contracture, left knee: Secondary | ICD-10-CM | POA: Diagnosis not present

## 2015-10-04 DIAGNOSIS — R1311 Dysphagia, oral phase: Secondary | ICD-10-CM | POA: Diagnosis not present

## 2015-10-04 DIAGNOSIS — M24561 Contracture, right knee: Secondary | ICD-10-CM | POA: Diagnosis not present

## 2015-10-05 DIAGNOSIS — G809 Cerebral palsy, unspecified: Secondary | ICD-10-CM | POA: Diagnosis not present

## 2015-10-05 DIAGNOSIS — R1311 Dysphagia, oral phase: Secondary | ICD-10-CM | POA: Diagnosis not present

## 2015-10-05 DIAGNOSIS — M24561 Contracture, right knee: Secondary | ICD-10-CM | POA: Diagnosis not present

## 2015-10-05 DIAGNOSIS — M24562 Contracture, left knee: Secondary | ICD-10-CM | POA: Diagnosis not present

## 2015-10-06 ENCOUNTER — Ambulatory Visit: Payer: Medicare Other | Admitting: Neurology

## 2015-10-06 DIAGNOSIS — M24561 Contracture, right knee: Secondary | ICD-10-CM | POA: Diagnosis not present

## 2015-10-06 DIAGNOSIS — G809 Cerebral palsy, unspecified: Secondary | ICD-10-CM | POA: Diagnosis not present

## 2015-10-06 DIAGNOSIS — M24562 Contracture, left knee: Secondary | ICD-10-CM | POA: Diagnosis not present

## 2015-10-06 DIAGNOSIS — R1311 Dysphagia, oral phase: Secondary | ICD-10-CM | POA: Diagnosis not present

## 2015-10-07 DIAGNOSIS — M24562 Contracture, left knee: Secondary | ICD-10-CM | POA: Diagnosis not present

## 2015-10-07 DIAGNOSIS — G809 Cerebral palsy, unspecified: Secondary | ICD-10-CM | POA: Diagnosis not present

## 2015-10-07 DIAGNOSIS — M24561 Contracture, right knee: Secondary | ICD-10-CM | POA: Diagnosis not present

## 2015-10-07 DIAGNOSIS — R1311 Dysphagia, oral phase: Secondary | ICD-10-CM | POA: Diagnosis not present

## 2015-10-08 DIAGNOSIS — R1311 Dysphagia, oral phase: Secondary | ICD-10-CM | POA: Diagnosis not present

## 2015-10-08 DIAGNOSIS — G809 Cerebral palsy, unspecified: Secondary | ICD-10-CM | POA: Diagnosis not present

## 2015-10-08 DIAGNOSIS — M24562 Contracture, left knee: Secondary | ICD-10-CM | POA: Diagnosis not present

## 2015-10-08 DIAGNOSIS — M24561 Contracture, right knee: Secondary | ICD-10-CM | POA: Diagnosis not present

## 2015-10-11 DIAGNOSIS — H05229 Edema of unspecified orbit: Secondary | ICD-10-CM | POA: Diagnosis not present

## 2015-10-11 DIAGNOSIS — G809 Cerebral palsy, unspecified: Secondary | ICD-10-CM | POA: Diagnosis not present

## 2015-10-11 DIAGNOSIS — M24561 Contracture, right knee: Secondary | ICD-10-CM | POA: Diagnosis not present

## 2015-10-11 DIAGNOSIS — Z8719 Personal history of other diseases of the digestive system: Secondary | ICD-10-CM | POA: Diagnosis not present

## 2015-10-11 DIAGNOSIS — F3340 Major depressive disorder, recurrent, in remission, unspecified: Secondary | ICD-10-CM | POA: Diagnosis not present

## 2015-10-11 DIAGNOSIS — R1311 Dysphagia, oral phase: Secondary | ICD-10-CM | POA: Diagnosis not present

## 2015-10-11 DIAGNOSIS — K219 Gastro-esophageal reflux disease without esophagitis: Secondary | ICD-10-CM | POA: Diagnosis not present

## 2015-10-11 DIAGNOSIS — M25569 Pain in unspecified knee: Secondary | ICD-10-CM | POA: Diagnosis not present

## 2015-10-11 DIAGNOSIS — M24562 Contracture, left knee: Secondary | ICD-10-CM | POA: Diagnosis not present

## 2015-10-12 DIAGNOSIS — M24562 Contracture, left knee: Secondary | ICD-10-CM | POA: Diagnosis not present

## 2015-10-12 DIAGNOSIS — R1311 Dysphagia, oral phase: Secondary | ICD-10-CM | POA: Diagnosis not present

## 2015-10-12 DIAGNOSIS — G809 Cerebral palsy, unspecified: Secondary | ICD-10-CM | POA: Diagnosis not present

## 2015-10-12 DIAGNOSIS — M24561 Contracture, right knee: Secondary | ICD-10-CM | POA: Diagnosis not present

## 2015-10-13 ENCOUNTER — Telehealth: Payer: Self-pay | Admitting: *Deleted

## 2015-10-13 DIAGNOSIS — M24561 Contracture, right knee: Secondary | ICD-10-CM | POA: Diagnosis not present

## 2015-10-13 DIAGNOSIS — G809 Cerebral palsy, unspecified: Secondary | ICD-10-CM | POA: Diagnosis not present

## 2015-10-13 DIAGNOSIS — R1311 Dysphagia, oral phase: Secondary | ICD-10-CM | POA: Diagnosis not present

## 2015-10-13 DIAGNOSIS — M24562 Contracture, left knee: Secondary | ICD-10-CM | POA: Diagnosis not present

## 2015-10-13 NOTE — Telephone Encounter (Signed)
Left message for a return call

## 2015-10-13 NOTE — Telephone Encounter (Signed)
Spoke to [place] - she is going to speak with the PCP at the nursing home and request a referral be made for a new appt.

## 2015-10-14 DIAGNOSIS — G809 Cerebral palsy, unspecified: Secondary | ICD-10-CM | POA: Diagnosis not present

## 2015-10-14 DIAGNOSIS — R1311 Dysphagia, oral phase: Secondary | ICD-10-CM | POA: Diagnosis not present

## 2015-10-14 DIAGNOSIS — M24561 Contracture, right knee: Secondary | ICD-10-CM | POA: Diagnosis not present

## 2015-10-14 DIAGNOSIS — M24562 Contracture, left knee: Secondary | ICD-10-CM | POA: Diagnosis not present

## 2015-10-15 DIAGNOSIS — M24562 Contracture, left knee: Secondary | ICD-10-CM | POA: Diagnosis not present

## 2015-10-15 DIAGNOSIS — G809 Cerebral palsy, unspecified: Secondary | ICD-10-CM | POA: Diagnosis not present

## 2015-10-15 DIAGNOSIS — R1311 Dysphagia, oral phase: Secondary | ICD-10-CM | POA: Diagnosis not present

## 2015-10-15 DIAGNOSIS — M24561 Contracture, right knee: Secondary | ICD-10-CM | POA: Diagnosis not present

## 2015-10-17 DIAGNOSIS — M24561 Contracture, right knee: Secondary | ICD-10-CM | POA: Diagnosis not present

## 2015-10-17 DIAGNOSIS — M24562 Contracture, left knee: Secondary | ICD-10-CM | POA: Diagnosis not present

## 2015-10-17 DIAGNOSIS — G809 Cerebral palsy, unspecified: Secondary | ICD-10-CM | POA: Diagnosis not present

## 2015-10-17 DIAGNOSIS — R1311 Dysphagia, oral phase: Secondary | ICD-10-CM | POA: Diagnosis not present

## 2015-10-18 DIAGNOSIS — G809 Cerebral palsy, unspecified: Secondary | ICD-10-CM | POA: Diagnosis not present

## 2015-10-18 DIAGNOSIS — M24561 Contracture, right knee: Secondary | ICD-10-CM | POA: Diagnosis not present

## 2015-10-18 DIAGNOSIS — M24562 Contracture, left knee: Secondary | ICD-10-CM | POA: Diagnosis not present

## 2015-10-18 DIAGNOSIS — R1311 Dysphagia, oral phase: Secondary | ICD-10-CM | POA: Diagnosis not present

## 2015-10-20 DIAGNOSIS — R1311 Dysphagia, oral phase: Secondary | ICD-10-CM | POA: Diagnosis not present

## 2015-10-20 DIAGNOSIS — G809 Cerebral palsy, unspecified: Secondary | ICD-10-CM | POA: Diagnosis not present

## 2015-10-20 DIAGNOSIS — M24562 Contracture, left knee: Secondary | ICD-10-CM | POA: Diagnosis not present

## 2015-10-20 DIAGNOSIS — M24561 Contracture, right knee: Secondary | ICD-10-CM | POA: Diagnosis not present

## 2015-10-21 DIAGNOSIS — M24562 Contracture, left knee: Secondary | ICD-10-CM | POA: Diagnosis not present

## 2015-10-21 DIAGNOSIS — M24561 Contracture, right knee: Secondary | ICD-10-CM | POA: Diagnosis not present

## 2015-10-21 DIAGNOSIS — G809 Cerebral palsy, unspecified: Secondary | ICD-10-CM | POA: Diagnosis not present

## 2015-10-21 DIAGNOSIS — R1311 Dysphagia, oral phase: Secondary | ICD-10-CM | POA: Diagnosis not present

## 2015-10-22 DIAGNOSIS — G809 Cerebral palsy, unspecified: Secondary | ICD-10-CM | POA: Diagnosis not present

## 2015-10-22 DIAGNOSIS — M24562 Contracture, left knee: Secondary | ICD-10-CM | POA: Diagnosis not present

## 2015-10-22 DIAGNOSIS — M24561 Contracture, right knee: Secondary | ICD-10-CM | POA: Diagnosis not present

## 2015-10-22 DIAGNOSIS — R1311 Dysphagia, oral phase: Secondary | ICD-10-CM | POA: Diagnosis not present

## 2015-10-25 DIAGNOSIS — M24561 Contracture, right knee: Secondary | ICD-10-CM | POA: Diagnosis not present

## 2015-10-25 DIAGNOSIS — G809 Cerebral palsy, unspecified: Secondary | ICD-10-CM | POA: Diagnosis not present

## 2015-10-25 DIAGNOSIS — M24562 Contracture, left knee: Secondary | ICD-10-CM | POA: Diagnosis not present

## 2015-10-25 DIAGNOSIS — R1311 Dysphagia, oral phase: Secondary | ICD-10-CM | POA: Diagnosis not present

## 2015-10-26 DIAGNOSIS — G809 Cerebral palsy, unspecified: Secondary | ICD-10-CM | POA: Diagnosis not present

## 2015-10-26 DIAGNOSIS — R1311 Dysphagia, oral phase: Secondary | ICD-10-CM | POA: Diagnosis not present

## 2015-10-26 DIAGNOSIS — M24561 Contracture, right knee: Secondary | ICD-10-CM | POA: Diagnosis not present

## 2015-10-26 DIAGNOSIS — M24562 Contracture, left knee: Secondary | ICD-10-CM | POA: Diagnosis not present

## 2015-10-27 DIAGNOSIS — G809 Cerebral palsy, unspecified: Secondary | ICD-10-CM | POA: Diagnosis not present

## 2015-10-27 DIAGNOSIS — M24561 Contracture, right knee: Secondary | ICD-10-CM | POA: Diagnosis not present

## 2015-10-27 DIAGNOSIS — M24562 Contracture, left knee: Secondary | ICD-10-CM | POA: Diagnosis not present

## 2015-10-27 DIAGNOSIS — R1311 Dysphagia, oral phase: Secondary | ICD-10-CM | POA: Diagnosis not present

## 2015-10-28 DIAGNOSIS — R110 Nausea: Secondary | ICD-10-CM | POA: Diagnosis not present

## 2015-10-28 DIAGNOSIS — R1311 Dysphagia, oral phase: Secondary | ICD-10-CM | POA: Diagnosis not present

## 2015-10-28 DIAGNOSIS — M24561 Contracture, right knee: Secondary | ICD-10-CM | POA: Diagnosis not present

## 2015-10-28 DIAGNOSIS — M24562 Contracture, left knee: Secondary | ICD-10-CM | POA: Diagnosis not present

## 2015-10-28 DIAGNOSIS — J069 Acute upper respiratory infection, unspecified: Secondary | ICD-10-CM | POA: Diagnosis not present

## 2015-10-28 DIAGNOSIS — M1990 Unspecified osteoarthritis, unspecified site: Secondary | ICD-10-CM | POA: Diagnosis not present

## 2015-10-28 DIAGNOSIS — G809 Cerebral palsy, unspecified: Secondary | ICD-10-CM | POA: Diagnosis not present

## 2015-10-29 DIAGNOSIS — R1311 Dysphagia, oral phase: Secondary | ICD-10-CM | POA: Diagnosis not present

## 2015-10-29 DIAGNOSIS — M24561 Contracture, right knee: Secondary | ICD-10-CM | POA: Diagnosis not present

## 2015-10-29 DIAGNOSIS — M24562 Contracture, left knee: Secondary | ICD-10-CM | POA: Diagnosis not present

## 2015-10-29 DIAGNOSIS — G809 Cerebral palsy, unspecified: Secondary | ICD-10-CM | POA: Diagnosis not present

## 2015-11-01 DIAGNOSIS — M24562 Contracture, left knee: Secondary | ICD-10-CM | POA: Diagnosis not present

## 2015-11-01 DIAGNOSIS — G809 Cerebral palsy, unspecified: Secondary | ICD-10-CM | POA: Diagnosis not present

## 2015-11-01 DIAGNOSIS — M24561 Contracture, right knee: Secondary | ICD-10-CM | POA: Diagnosis not present

## 2015-11-02 DIAGNOSIS — M24561 Contracture, right knee: Secondary | ICD-10-CM | POA: Diagnosis not present

## 2015-11-02 DIAGNOSIS — G809 Cerebral palsy, unspecified: Secondary | ICD-10-CM | POA: Diagnosis not present

## 2015-11-02 DIAGNOSIS — M24562 Contracture, left knee: Secondary | ICD-10-CM | POA: Diagnosis not present

## 2015-11-03 DIAGNOSIS — M24561 Contracture, right knee: Secondary | ICD-10-CM | POA: Diagnosis not present

## 2015-11-03 DIAGNOSIS — M24562 Contracture, left knee: Secondary | ICD-10-CM | POA: Diagnosis not present

## 2015-11-03 DIAGNOSIS — G809 Cerebral palsy, unspecified: Secondary | ICD-10-CM | POA: Diagnosis not present

## 2015-11-04 DIAGNOSIS — M24562 Contracture, left knee: Secondary | ICD-10-CM | POA: Diagnosis not present

## 2015-11-04 DIAGNOSIS — M24561 Contracture, right knee: Secondary | ICD-10-CM | POA: Diagnosis not present

## 2015-11-04 DIAGNOSIS — I10 Essential (primary) hypertension: Secondary | ICD-10-CM | POA: Diagnosis not present

## 2015-11-04 DIAGNOSIS — R0989 Other specified symptoms and signs involving the circulatory and respiratory systems: Secondary | ICD-10-CM | POA: Diagnosis not present

## 2015-11-04 DIAGNOSIS — R110 Nausea: Secondary | ICD-10-CM | POA: Diagnosis not present

## 2015-11-04 DIAGNOSIS — D649 Anemia, unspecified: Secondary | ICD-10-CM | POA: Diagnosis not present

## 2015-11-04 DIAGNOSIS — K219 Gastro-esophageal reflux disease without esophagitis: Secondary | ICD-10-CM | POA: Diagnosis not present

## 2015-11-04 DIAGNOSIS — G809 Cerebral palsy, unspecified: Secondary | ICD-10-CM | POA: Diagnosis not present

## 2015-11-04 DIAGNOSIS — R1110 Vomiting, unspecified: Secondary | ICD-10-CM | POA: Diagnosis not present

## 2015-11-05 DIAGNOSIS — K219 Gastro-esophageal reflux disease without esophagitis: Secondary | ICD-10-CM | POA: Diagnosis not present

## 2015-11-05 DIAGNOSIS — G809 Cerebral palsy, unspecified: Secondary | ICD-10-CM | POA: Diagnosis not present

## 2015-11-05 DIAGNOSIS — M24562 Contracture, left knee: Secondary | ICD-10-CM | POA: Diagnosis not present

## 2015-11-05 DIAGNOSIS — M24561 Contracture, right knee: Secondary | ICD-10-CM | POA: Diagnosis not present

## 2015-11-05 DIAGNOSIS — K567 Ileus, unspecified: Secondary | ICD-10-CM | POA: Diagnosis not present

## 2015-11-08 DIAGNOSIS — G809 Cerebral palsy, unspecified: Secondary | ICD-10-CM | POA: Diagnosis not present

## 2015-11-08 DIAGNOSIS — M24562 Contracture, left knee: Secondary | ICD-10-CM | POA: Diagnosis not present

## 2015-11-08 DIAGNOSIS — M24561 Contracture, right knee: Secondary | ICD-10-CM | POA: Diagnosis not present

## 2015-11-09 DIAGNOSIS — G809 Cerebral palsy, unspecified: Secondary | ICD-10-CM | POA: Diagnosis not present

## 2015-11-09 DIAGNOSIS — M24562 Contracture, left knee: Secondary | ICD-10-CM | POA: Diagnosis not present

## 2015-11-09 DIAGNOSIS — M24561 Contracture, right knee: Secondary | ICD-10-CM | POA: Diagnosis not present

## 2015-11-10 DIAGNOSIS — G809 Cerebral palsy, unspecified: Secondary | ICD-10-CM | POA: Diagnosis not present

## 2015-11-10 DIAGNOSIS — M24562 Contracture, left knee: Secondary | ICD-10-CM | POA: Diagnosis not present

## 2015-11-10 DIAGNOSIS — M24561 Contracture, right knee: Secondary | ICD-10-CM | POA: Diagnosis not present

## 2015-11-11 DIAGNOSIS — M24561 Contracture, right knee: Secondary | ICD-10-CM | POA: Diagnosis not present

## 2015-11-11 DIAGNOSIS — M24562 Contracture, left knee: Secondary | ICD-10-CM | POA: Diagnosis not present

## 2015-11-11 DIAGNOSIS — G809 Cerebral palsy, unspecified: Secondary | ICD-10-CM | POA: Diagnosis not present

## 2015-11-12 DIAGNOSIS — M24561 Contracture, right knee: Secondary | ICD-10-CM | POA: Diagnosis not present

## 2015-11-12 DIAGNOSIS — I2510 Atherosclerotic heart disease of native coronary artery without angina pectoris: Secondary | ICD-10-CM | POA: Diagnosis not present

## 2015-11-12 DIAGNOSIS — G809 Cerebral palsy, unspecified: Secondary | ICD-10-CM | POA: Diagnosis not present

## 2015-11-12 DIAGNOSIS — M79672 Pain in left foot: Secondary | ICD-10-CM | POA: Diagnosis not present

## 2015-11-12 DIAGNOSIS — B351 Tinea unguium: Secondary | ICD-10-CM | POA: Diagnosis not present

## 2015-11-12 DIAGNOSIS — M24562 Contracture, left knee: Secondary | ICD-10-CM | POA: Diagnosis not present

## 2015-11-12 DIAGNOSIS — M79671 Pain in right foot: Secondary | ICD-10-CM | POA: Diagnosis not present

## 2015-11-15 DIAGNOSIS — G809 Cerebral palsy, unspecified: Secondary | ICD-10-CM | POA: Diagnosis not present

## 2015-11-15 DIAGNOSIS — M24562 Contracture, left knee: Secondary | ICD-10-CM | POA: Diagnosis not present

## 2015-11-15 DIAGNOSIS — M24561 Contracture, right knee: Secondary | ICD-10-CM | POA: Diagnosis not present

## 2015-11-17 DIAGNOSIS — M24562 Contracture, left knee: Secondary | ICD-10-CM | POA: Diagnosis not present

## 2015-11-17 DIAGNOSIS — M24561 Contracture, right knee: Secondary | ICD-10-CM | POA: Diagnosis not present

## 2015-11-17 DIAGNOSIS — G809 Cerebral palsy, unspecified: Secondary | ICD-10-CM | POA: Diagnosis not present

## 2015-11-18 DIAGNOSIS — M24561 Contracture, right knee: Secondary | ICD-10-CM | POA: Diagnosis not present

## 2015-11-18 DIAGNOSIS — G809 Cerebral palsy, unspecified: Secondary | ICD-10-CM | POA: Diagnosis not present

## 2015-11-18 DIAGNOSIS — M24562 Contracture, left knee: Secondary | ICD-10-CM | POA: Diagnosis not present

## 2015-11-19 DIAGNOSIS — M24562 Contracture, left knee: Secondary | ICD-10-CM | POA: Diagnosis not present

## 2015-11-19 DIAGNOSIS — M24561 Contracture, right knee: Secondary | ICD-10-CM | POA: Diagnosis not present

## 2015-11-19 DIAGNOSIS — G809 Cerebral palsy, unspecified: Secondary | ICD-10-CM | POA: Diagnosis not present

## 2015-11-22 DIAGNOSIS — M24561 Contracture, right knee: Secondary | ICD-10-CM | POA: Diagnosis not present

## 2015-11-22 DIAGNOSIS — G809 Cerebral palsy, unspecified: Secondary | ICD-10-CM | POA: Diagnosis not present

## 2015-11-22 DIAGNOSIS — M24562 Contracture, left knee: Secondary | ICD-10-CM | POA: Diagnosis not present

## 2015-11-23 ENCOUNTER — Ambulatory Visit: Payer: Medicare Other | Admitting: Neurology

## 2015-11-23 DIAGNOSIS — G809 Cerebral palsy, unspecified: Secondary | ICD-10-CM | POA: Diagnosis not present

## 2015-11-23 DIAGNOSIS — M24561 Contracture, right knee: Secondary | ICD-10-CM | POA: Diagnosis not present

## 2015-11-23 DIAGNOSIS — M24562 Contracture, left knee: Secondary | ICD-10-CM | POA: Diagnosis not present

## 2015-11-24 DIAGNOSIS — M24562 Contracture, left knee: Secondary | ICD-10-CM | POA: Diagnosis not present

## 2015-11-24 DIAGNOSIS — G809 Cerebral palsy, unspecified: Secondary | ICD-10-CM | POA: Diagnosis not present

## 2015-11-24 DIAGNOSIS — M24561 Contracture, right knee: Secondary | ICD-10-CM | POA: Diagnosis not present

## 2015-11-25 DIAGNOSIS — G809 Cerebral palsy, unspecified: Secondary | ICD-10-CM | POA: Diagnosis not present

## 2015-11-25 DIAGNOSIS — M24561 Contracture, right knee: Secondary | ICD-10-CM | POA: Diagnosis not present

## 2015-11-25 DIAGNOSIS — M24562 Contracture, left knee: Secondary | ICD-10-CM | POA: Diagnosis not present

## 2015-11-30 ENCOUNTER — Ambulatory Visit (INDEPENDENT_AMBULATORY_CARE_PROVIDER_SITE_OTHER): Payer: Medicare Other | Admitting: Neurology

## 2015-11-30 ENCOUNTER — Encounter: Payer: Self-pay | Admitting: Neurology

## 2015-11-30 VITALS — BP 146/79 | HR 60

## 2015-11-30 DIAGNOSIS — G800 Spastic quadriplegic cerebral palsy: Secondary | ICD-10-CM | POA: Diagnosis not present

## 2015-11-30 DIAGNOSIS — IMO0002 Reserved for concepts with insufficient information to code with codable children: Secondary | ICD-10-CM

## 2015-11-30 DIAGNOSIS — R1310 Dysphagia, unspecified: Secondary | ICD-10-CM

## 2015-11-30 DIAGNOSIS — G8220 Paraplegia, unspecified: Secondary | ICD-10-CM

## 2015-11-30 NOTE — Progress Notes (Signed)
PATIENT: [name] DOB: 06/07/1936  Chief Complaint  Patient presents with  . Cerebral Palsy    She is here with her cousin and POA, Wendall Stade.  She is here to have her difficulty swallowing and frequent choking evaluated.     HISTORICAL  [name] is a 80 years old right-handed female accompanied by her cousin, also power of attorney Wendall Stade, seen in refer by her primary care physician Dr. Wilford Corner for evaluation of frequent choking episode  I saw her previously in October 2015 for evaluation of bilateral lower extremity spasticity, which has resolved with Flexeril treatment,  She was born with cerebral palsy, was able to ambulate with crutches, walkers when she was younger, has been wheelchair bound since 2010, she used to live with her parents at home, now they both passed away, she has been in [facility] facilities since 2013  Now she is totally wheelchair-bound, needs lifter to transport her in and out of wheelchair, she still able to feed herself.  Around 2016, she began to complains of choking episode, on further questioning, she only occasionally choked with her food, it is usually related to pizza or tomato, what bothers her the most of those episode of sudden onset acid reflux, watering from her mouth, nausea, to the point of choking sensation, she has been taking Protonix 40 mg twice a day, which has been helpful, she is also taking Compazine 10 mg twice a day,  Over the years, she had gradual worsening posturing, bending over, spend most of her day in a wheelchair, needs 2 people or lifter to change position, she denies significant difficulty drinking water or coffee, there was no significant change in her talking,  REVIEW OF SYSTEMS: Full 14 system review of systems performed and notable only for as above ALLERGIES: No Known Allergies  HOME MEDICATIONS: Current Outpatient Prescriptions  Medication Sig Dispense Refill  . cholecalciferol (VITAMIN  D) 1000 UNITS tablet Take 2,000 Units by mouth daily.    . citalopram (CELEXA) 40 MG tablet Take 40 mg by mouth daily.    . Cranberry 450 MG CAPS Take 1 capsule by mouth 2 (two) times daily.    . cyclobenzaprine (FLEXERIL) 10 MG tablet Take 10 mg by mouth 3 (three) times daily as needed for muscle spasms.    Marland Kitchen docusate sodium (COLACE) 100 MG capsule Take 100 mg by mouth 2 (two) times daily.    . ferrous gluconate (FERGON) 324 MG tablet Take 324 mg by mouth every evening.    Marland Kitchen HYDROcodone-acetaminophen (NORCO/VICODIN) 5-325 MG per tablet Take 1 tablet by mouth every 4 (four) hours as needed for moderate pain.    Marland Kitchen loratadine (CLARITIN) 10 MG tablet Take 10 mg by mouth daily.    . pantoprazole (PROTONIX) 40 MG tablet Take 1 tablet (40 mg total) by mouth 2 (two) times daily.    . polyethylene glycol (MIRALAX / GLYCOLAX) packet Take 17 g by mouth at bedtime.    . prochlorperazine (COMPAZINE) 5 MG tablet Take 5 mg by mouth 3 (three) times daily.    Marland Kitchen saccharomyces boulardii (FLORASTOR) 250 MG capsule Take 250 mg by mouth 2 (two) times daily.    . traMADol (ULTRAM) 50 MG tablet Take 50 mg by mouth 3 (three) times daily.     No current facility-administered medications for this visit.    PAST MEDICAL HISTORY: Past Medical History  Diagnosis Date  . Constipation   . Cerebral palsy ([place])   .  Hyperlipidemia     PAST SURGICAL HISTORY: Past Surgical History  Procedure Laterality Date  . Right hip surgery    . Tonsillectomy    . Esophagogastroduodenoscopy  01/28/2012    Procedure: ESOPHAGOGASTRODUODENOSCOPY (EGD);  Surgeon: Lear Ng, MD;  Location: [facility] ENDOSCOPY;  Service: Endoscopy;  Laterality: N/A;  . Esophagogastroduodenoscopy N/A 05/10/2014    Procedure: ESOPHAGOGASTRODUODENOSCOPY (EGD);  Surgeon: Missy Sabins, MD;  Location: [facility] ENDOSCOPY;  Service: Endoscopy;  Laterality: N/A;    FAMILY HISTORY: No family history on file.  SOCIAL HISTORY:  Social History   Social History    . Marital Status: Single    Spouse Name: N/A  . Number of Children: N/A  . Years of Education: N/A   Occupational History  . Not on file.   Social History Main Topics  . Smoking status: Never Smoker   . Smokeless tobacco: Never Used  . Alcohol Use: No  . Drug Use: No  . Sexual Activity: No   Other Topics Concern  . Not on file   Social History Narrative     PHYSICAL EXAM   Filed Vitals:   11/30/15 1224  BP: 146/79  Pulse: 60    Not recorded      There is no weight on file to calculate BMI.  PHYSICAL EXAMNIATION:  Gen: NAD, conversant, well nourised, obese, well groomed                     Cardiovascular: Regular rate rhythm, no peripheral edema, warm, nontender. Eyes: Conjunctivae clear without exudates or hemorrhage Neck: Supple, no carotid bruise. Pulmonary: Clear to auscultation bilaterally   NEUROLOGICAL EXAM:  MENTAL STATUS: Speech:    Speech is normal; fluent and spontaneous with normal comprehension.  Cognition:     Orientation to time, place and person     Normal recent and remote memory     Normal Attention span and concentration     Normal Language, naming, repeating,spontaneous speech     Fund of knowledge   CRANIAL NERVES: CN II: Visual fields are full to confrontation.  Pupils are round equal and briskly reactive to light. CN III, IV, VI: extraocular movement are normal. No ptosis. CN V: Facial sensation is intact to pinprick in all 3 divisions bilaterally. Corneal responses are intact.  CN VII: Face is symmetric, she has mild eye-closure cheek puff weakness. CN VIII: Hearing is normal to rubbing fingers CN IX, X: Palate elevates symmetrically. Phonation is normal. CN XI: Head turning and shoulder shrug are intact CN XII: Tongue is midline with normal movements and no atrophy.  MOTOR: She sits in wheelchair, with scoliosis, moderate spasticity of bilateral upper extremity, still able to move both arms against gravity, significant  spasticity, and fixed contraction of bilateral knee, she has no antigravity movement of bilateral hip flexion, able to dorsi flexion bilateral ankles, left side stronger than the right.  REFLEXES: Reflexes are 2+ and symmetric at the biceps, triceps, absent at knees and ankles.  SENSORY: Intact to light touch, pinprick, positional sensation and vibratory sensation are intact in fingers and toes.  COORDINATION: There is no dysmetria on finger-to-nose, but with limitation from spasticity    GAIT/STANCE: Deferred   DIAGNOSTIC DATA (LABS, IMAGING, TESTING) - I reviewed patient records, labs, notes, testing and imaging myself where available.   ASSESSMENT AND PLAN  ADOLPH PLATANIA is a 80 y.o. 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 Ph.D.  [facility] [address] [place], [place] [address] Ph: [phone] Fax: [phone]  CC: Referring Provider

## 2015-12-01 ENCOUNTER — Telehealth: Payer: Self-pay | Admitting: Neurology

## 2015-12-01 ENCOUNTER — Other Ambulatory Visit (HOSPITAL_COMMUNITY): Payer: Self-pay | Admitting: Neurology

## 2015-12-01 DIAGNOSIS — R1310 Dysphagia, unspecified: Secondary | ICD-10-CM

## 2015-12-01 NOTE — Telephone Encounter (Signed)
Spoke to [place] he scheduled patient for 12/08/2015 arrive at 12:30 for 1:00  at [facility] check in at main entrance. (Swallow Study) Spoke to [place] at [place] she is aware of details of apt. And she will arrange transportation.

## 2015-12-01 NOTE — Telephone Encounter (Signed)
Lelon Huh  [id number] for scheduling asked her to call me back. I need to schedule a modified Barium Swallow test. If Abigail Butts calls I will speak to her about patient.

## 2015-12-01 NOTE — Telephone Encounter (Signed)
[name] called from [place] and relayed to call her back when Barium study is set up so she can arrange transportation. [phone].

## 2015-12-02 ENCOUNTER — Telehealth: Payer: Self-pay | Admitting: Neurology

## 2015-12-02 LAB — CBC
HEMOGLOBIN: 13.3 g/dL (ref 11.1–15.9)
Hematocrit: 41.3 % (ref 34.0–46.6)
MCH: 28.8 pg (ref 26.6–33.0)
MCHC: 32.2 g/dL (ref 31.5–35.7)
MCV: 89 fL (ref 79–97)
Platelets: 220 10*3/uL (ref 150–379)
RBC: 4.62 x10E6/uL (ref 3.77–5.28)
RDW: 14.3 % (ref 12.3–15.4)
WBC: 5.6 10*3/uL (ref 3.4–10.8)

## 2015-12-02 LAB — COMPREHENSIVE METABOLIC PANEL
ALT: 8 IU/L (ref 0–32)
AST: 13 IU/L (ref 0–40)
Albumin/Globulin Ratio: 1.5 (ref 1.2–2.2)
Albumin: 3.9 g/dL (ref 3.5–4.8)
Alkaline Phosphatase: 69 IU/L (ref 39–117)
BUN/Creatinine Ratio: 10 — ABNORMAL LOW (ref 12–28)
BUN: 9 mg/dL (ref 8–27)
Bilirubin Total: 0.3 mg/dL (ref 0.0–1.2)
CALCIUM: 9.1 mg/dL (ref 8.7–10.3)
CHLORIDE: 101 mmol/L (ref 96–106)
CO2: 24 mmol/L (ref 18–29)
Creatinine, Ser: 0.91 mg/dL (ref 0.57–1.00)
GFR calc non Af Amer: 61 mL/min/{1.73_m2} (ref >59)
GFR, EST AFRICAN AMERICAN: 70 mL/min/{1.73_m2} (ref >59)
GLOBULIN, TOTAL: 2.6 g/dL (ref 1.5–4.5)
Glucose: 96 mg/dL (ref 65–99)
POTASSIUM: 4.3 mmol/L (ref 3.5–5.2)
Sodium: 140 mmol/L (ref 134–144)
Total Protein: 6.5 g/dL (ref 6.0–8.5)

## 2015-12-02 LAB — CK: CK TOTAL: 215 U/L — AB (ref 24–173)

## 2015-12-02 LAB — ACETYLCHOLINE RECEPTOR, MODULATING: Acetylcholine Modulat Ab: <12 % (ref 0–20)

## 2015-12-02 LAB — ACETYLCHOLINE RECEPTOR, BINDING

## 2015-12-02 LAB — TSH: TSH: 0.732 u[IU]/mL (ref 0.450–4.500)

## 2015-12-02 NOTE — Telephone Encounter (Signed)
Spoke to [employer], [place], to let her know the results of her labs.

## 2015-12-02 NOTE — Telephone Encounter (Signed)
Please call patient, laboratory evaluation showed no significant abnormality. 

## 2015-12-03 ENCOUNTER — Telehealth: Payer: Self-pay | Admitting: Neurology

## 2015-12-03 NOTE — Telephone Encounter (Signed)
Pt's cousin/POA Langley Gauss called today teary-eyed said the recommendation note was given to [facility] to reposition the pt every 2 hours but she was told that would not be done.  She was told that it will need to be an order by the provider not a recommendation. She is requesting the order be faxed to Dr Forde Dandy today if possible. She would like to pick up the order on Monday so she can hand deliver it. She is also requesting to pick up a copy of pt's labs regarding thyroid. She is aware Dr Krista Blue is out of the office today and the clinic closes at 28 today.  She will bring in POA papers to be scanned in and sign DPR. Last DPR signed was on 03/26/12.

## 2015-12-06 NOTE — Telephone Encounter (Signed)
error 

## 2015-12-06 NOTE — Telephone Encounter (Signed)
Orders to reposition patient every 2 hours written by Dr. Krista Blue and faxed to Dr. Baldwin Crown attention at [facility] (MD office fax [phone], Nursing home fax [phone]).  Spoke to [place] - she has a copy of the patient's most recent labs.

## 2015-12-08 ENCOUNTER — Ambulatory Visit (HOSPITAL_COMMUNITY)
Admission: RE | Admit: 2015-12-08 | Discharge: 2015-12-08 | Disposition: A | Discharge status: Home / Self Care | Payer: Medicare Other | Source: Ambulatory Visit | Attending: Neurology | Admitting: Neurology

## 2015-12-08 ENCOUNTER — Telehealth: Payer: Self-pay | Admitting: Neurology

## 2015-12-08 DIAGNOSIS — R1310 Dysphagia, unspecified: Secondary | ICD-10-CM

## 2015-12-08 DIAGNOSIS — R05 Cough: Secondary | ICD-10-CM | POA: Diagnosis not present

## 2015-12-08 DIAGNOSIS — G800 Spastic quadriplegic cerebral palsy: Secondary | ICD-10-CM

## 2015-12-08 DIAGNOSIS — IMO0002 Reserved for concepts with insufficient information to code with codable children: Secondary | ICD-10-CM

## 2015-12-08 NOTE — Telephone Encounter (Signed)
Please call patient, her swallowing study showed evidence of aspiration, she would benefit speech pathologists evaluation, I can refer her to ST, either outpatient, or her facility ST treatment  Episodes of laryngeal penetration with thin and nectar thick liquid. Considerable residue with various consistencies. Premature spill with thinner liquids. 2. Poor oral control, difficulty swallowing pill except with thin liquids.  Please refer to the Speech Pathologists report for complete details and recommendations.

## 2015-12-09 ENCOUNTER — Other Ambulatory Visit: Payer: Self-pay | Admitting: *Deleted

## 2015-12-09 DIAGNOSIS — T17308A Unspecified foreign body in larynx causing other injury, initial encounter: Secondary | ICD-10-CM

## 2015-12-09 DIAGNOSIS — R1310 Dysphagia, unspecified: Secondary | ICD-10-CM

## 2015-12-09 NOTE — Telephone Encounter (Signed)
Left message for [name] (POA on HIPPA) to return my call.

## 2015-12-09 NOTE — Telephone Encounter (Signed)
Returned call to Langley Gauss - feels it would be easier for patient to have speech therapy at [facility], where she resides.  Order placed in Epic and sent to our referral coordinator, Rance Muir.

## 2015-12-09 NOTE — Telephone Encounter (Addendum)
Wendall Stade POA for the patient is returning your call. She can be reached at [phone].

## 2015-12-22 DIAGNOSIS — M24561 Contracture, right knee: Secondary | ICD-10-CM | POA: Diagnosis not present

## 2015-12-22 DIAGNOSIS — R1312 Dysphagia, oropharyngeal phase: Secondary | ICD-10-CM | POA: Diagnosis not present

## 2015-12-22 DIAGNOSIS — M24562 Contracture, left knee: Secondary | ICD-10-CM | POA: Diagnosis not present

## 2015-12-23 DIAGNOSIS — R1312 Dysphagia, oropharyngeal phase: Secondary | ICD-10-CM | POA: Diagnosis not present

## 2015-12-23 DIAGNOSIS — M24561 Contracture, right knee: Secondary | ICD-10-CM | POA: Diagnosis not present

## 2015-12-23 DIAGNOSIS — M24562 Contracture, left knee: Secondary | ICD-10-CM | POA: Diagnosis not present

## 2015-12-24 DIAGNOSIS — M24561 Contracture, right knee: Secondary | ICD-10-CM | POA: Diagnosis not present

## 2015-12-24 DIAGNOSIS — F3340 Major depressive disorder, recurrent, in remission, unspecified: Secondary | ICD-10-CM | POA: Diagnosis not present

## 2015-12-24 DIAGNOSIS — M24562 Contracture, left knee: Secondary | ICD-10-CM | POA: Diagnosis not present

## 2015-12-24 DIAGNOSIS — H05229 Edema of unspecified orbit: Secondary | ICD-10-CM | POA: Diagnosis not present

## 2015-12-24 DIAGNOSIS — K5900 Constipation, unspecified: Secondary | ICD-10-CM | POA: Diagnosis not present

## 2015-12-24 DIAGNOSIS — M25569 Pain in unspecified knee: Secondary | ICD-10-CM | POA: Diagnosis not present

## 2015-12-24 DIAGNOSIS — G809 Cerebral palsy, unspecified: Secondary | ICD-10-CM | POA: Diagnosis not present

## 2015-12-24 DIAGNOSIS — K219 Gastro-esophageal reflux disease without esophagitis: Secondary | ICD-10-CM | POA: Diagnosis not present

## 2015-12-24 DIAGNOSIS — R1312 Dysphagia, oropharyngeal phase: Secondary | ICD-10-CM | POA: Diagnosis not present

## 2015-12-27 DIAGNOSIS — M24561 Contracture, right knee: Secondary | ICD-10-CM | POA: Diagnosis not present

## 2015-12-27 DIAGNOSIS — R1312 Dysphagia, oropharyngeal phase: Secondary | ICD-10-CM | POA: Diagnosis not present

## 2015-12-27 DIAGNOSIS — M24562 Contracture, left knee: Secondary | ICD-10-CM | POA: Diagnosis not present

## 2015-12-28 DIAGNOSIS — M24561 Contracture, right knee: Secondary | ICD-10-CM | POA: Diagnosis not present

## 2015-12-28 DIAGNOSIS — M24562 Contracture, left knee: Secondary | ICD-10-CM | POA: Diagnosis not present

## 2015-12-28 DIAGNOSIS — R1312 Dysphagia, oropharyngeal phase: Secondary | ICD-10-CM | POA: Diagnosis not present

## 2015-12-29 DIAGNOSIS — M24562 Contracture, left knee: Secondary | ICD-10-CM | POA: Diagnosis not present

## 2015-12-29 DIAGNOSIS — M24561 Contracture, right knee: Secondary | ICD-10-CM | POA: Diagnosis not present

## 2015-12-29 DIAGNOSIS — R1312 Dysphagia, oropharyngeal phase: Secondary | ICD-10-CM | POA: Diagnosis not present

## 2015-12-30 DIAGNOSIS — R1312 Dysphagia, oropharyngeal phase: Secondary | ICD-10-CM | POA: Diagnosis not present

## 2015-12-30 DIAGNOSIS — R1311 Dysphagia, oral phase: Secondary | ICD-10-CM | POA: Diagnosis not present

## 2015-12-31 DIAGNOSIS — R1311 Dysphagia, oral phase: Secondary | ICD-10-CM | POA: Diagnosis not present

## 2015-12-31 DIAGNOSIS — R1312 Dysphagia, oropharyngeal phase: Secondary | ICD-10-CM | POA: Diagnosis not present

## 2016-01-03 DIAGNOSIS — R1311 Dysphagia, oral phase: Secondary | ICD-10-CM | POA: Diagnosis not present

## 2016-01-03 DIAGNOSIS — F331 Major depressive disorder, recurrent, moderate: Secondary | ICD-10-CM | POA: Diagnosis not present

## 2016-01-03 DIAGNOSIS — R1312 Dysphagia, oropharyngeal phase: Secondary | ICD-10-CM | POA: Diagnosis not present

## 2016-01-04 DIAGNOSIS — R1312 Dysphagia, oropharyngeal phase: Secondary | ICD-10-CM | POA: Diagnosis not present

## 2016-01-04 DIAGNOSIS — R1311 Dysphagia, oral phase: Secondary | ICD-10-CM | POA: Diagnosis not present

## 2016-01-05 DIAGNOSIS — R1312 Dysphagia, oropharyngeal phase: Secondary | ICD-10-CM | POA: Diagnosis not present

## 2016-01-05 DIAGNOSIS — R1311 Dysphagia, oral phase: Secondary | ICD-10-CM | POA: Diagnosis not present

## 2016-01-06 DIAGNOSIS — R1312 Dysphagia, oropharyngeal phase: Secondary | ICD-10-CM | POA: Diagnosis not present

## 2016-01-06 DIAGNOSIS — R1311 Dysphagia, oral phase: Secondary | ICD-10-CM | POA: Diagnosis not present

## 2016-01-08 DIAGNOSIS — R1312 Dysphagia, oropharyngeal phase: Secondary | ICD-10-CM | POA: Diagnosis not present

## 2016-01-08 DIAGNOSIS — R1311 Dysphagia, oral phase: Secondary | ICD-10-CM | POA: Diagnosis not present

## 2016-01-10 DIAGNOSIS — R1311 Dysphagia, oral phase: Secondary | ICD-10-CM | POA: Diagnosis not present

## 2016-01-10 DIAGNOSIS — R1312 Dysphagia, oropharyngeal phase: Secondary | ICD-10-CM | POA: Diagnosis not present

## 2016-01-11 DIAGNOSIS — R1311 Dysphagia, oral phase: Secondary | ICD-10-CM | POA: Diagnosis not present

## 2016-01-11 DIAGNOSIS — R1312 Dysphagia, oropharyngeal phase: Secondary | ICD-10-CM | POA: Diagnosis not present

## 2016-01-12 DIAGNOSIS — R1311 Dysphagia, oral phase: Secondary | ICD-10-CM | POA: Diagnosis not present

## 2016-01-12 DIAGNOSIS — R1312 Dysphagia, oropharyngeal phase: Secondary | ICD-10-CM | POA: Diagnosis not present

## 2016-01-12 DIAGNOSIS — R109 Unspecified abdominal pain: Secondary | ICD-10-CM | POA: Diagnosis not present

## 2016-01-13 DIAGNOSIS — R1312 Dysphagia, oropharyngeal phase: Secondary | ICD-10-CM | POA: Diagnosis not present

## 2016-01-13 DIAGNOSIS — R1311 Dysphagia, oral phase: Secondary | ICD-10-CM | POA: Diagnosis not present

## 2016-01-14 DIAGNOSIS — R1311 Dysphagia, oral phase: Secondary | ICD-10-CM | POA: Diagnosis not present

## 2016-01-14 DIAGNOSIS — R1312 Dysphagia, oropharyngeal phase: Secondary | ICD-10-CM | POA: Diagnosis not present

## 2016-01-17 DIAGNOSIS — R1311 Dysphagia, oral phase: Secondary | ICD-10-CM | POA: Diagnosis not present

## 2016-01-17 DIAGNOSIS — R1312 Dysphagia, oropharyngeal phase: Secondary | ICD-10-CM | POA: Diagnosis not present

## 2016-01-18 DIAGNOSIS — R1312 Dysphagia, oropharyngeal phase: Secondary | ICD-10-CM | POA: Diagnosis not present

## 2016-01-18 DIAGNOSIS — R1311 Dysphagia, oral phase: Secondary | ICD-10-CM | POA: Diagnosis not present

## 2016-01-19 DIAGNOSIS — R1312 Dysphagia, oropharyngeal phase: Secondary | ICD-10-CM | POA: Diagnosis not present

## 2016-01-19 DIAGNOSIS — R1311 Dysphagia, oral phase: Secondary | ICD-10-CM | POA: Diagnosis not present

## 2016-01-20 DIAGNOSIS — R1311 Dysphagia, oral phase: Secondary | ICD-10-CM | POA: Diagnosis not present

## 2016-01-20 DIAGNOSIS — R1312 Dysphagia, oropharyngeal phase: Secondary | ICD-10-CM | POA: Diagnosis not present

## 2016-01-21 DIAGNOSIS — R1311 Dysphagia, oral phase: Secondary | ICD-10-CM | POA: Diagnosis not present

## 2016-01-21 DIAGNOSIS — R1312 Dysphagia, oropharyngeal phase: Secondary | ICD-10-CM | POA: Diagnosis not present

## 2016-01-24 DIAGNOSIS — R1312 Dysphagia, oropharyngeal phase: Secondary | ICD-10-CM | POA: Diagnosis not present

## 2016-01-24 DIAGNOSIS — R1311 Dysphagia, oral phase: Secondary | ICD-10-CM | POA: Diagnosis not present

## 2016-01-25 DIAGNOSIS — R1312 Dysphagia, oropharyngeal phase: Secondary | ICD-10-CM | POA: Diagnosis not present

## 2016-01-25 DIAGNOSIS — R1311 Dysphagia, oral phase: Secondary | ICD-10-CM | POA: Diagnosis not present

## 2016-01-26 DIAGNOSIS — R1311 Dysphagia, oral phase: Secondary | ICD-10-CM | POA: Diagnosis not present

## 2016-01-26 DIAGNOSIS — R1312 Dysphagia, oropharyngeal phase: Secondary | ICD-10-CM | POA: Diagnosis not present

## 2016-01-27 DIAGNOSIS — R1312 Dysphagia, oropharyngeal phase: Secondary | ICD-10-CM | POA: Diagnosis not present

## 2016-01-27 DIAGNOSIS — R1311 Dysphagia, oral phase: Secondary | ICD-10-CM | POA: Diagnosis not present

## 2016-01-28 DIAGNOSIS — R1312 Dysphagia, oropharyngeal phase: Secondary | ICD-10-CM | POA: Diagnosis not present

## 2016-01-28 DIAGNOSIS — R1311 Dysphagia, oral phase: Secondary | ICD-10-CM | POA: Diagnosis not present

## 2016-01-31 DIAGNOSIS — R1312 Dysphagia, oropharyngeal phase: Secondary | ICD-10-CM | POA: Diagnosis not present

## 2016-01-31 DIAGNOSIS — R1311 Dysphagia, oral phase: Secondary | ICD-10-CM | POA: Diagnosis not present

## 2016-02-01 DIAGNOSIS — R1312 Dysphagia, oropharyngeal phase: Secondary | ICD-10-CM | POA: Diagnosis not present

## 2016-02-01 DIAGNOSIS — R1311 Dysphagia, oral phase: Secondary | ICD-10-CM | POA: Diagnosis not present

## 2016-02-02 DIAGNOSIS — R1312 Dysphagia, oropharyngeal phase: Secondary | ICD-10-CM | POA: Diagnosis not present

## 2016-02-02 DIAGNOSIS — R1311 Dysphagia, oral phase: Secondary | ICD-10-CM | POA: Diagnosis not present

## 2016-02-03 DIAGNOSIS — R1312 Dysphagia, oropharyngeal phase: Secondary | ICD-10-CM | POA: Diagnosis not present

## 2016-02-03 DIAGNOSIS — R1311 Dysphagia, oral phase: Secondary | ICD-10-CM | POA: Diagnosis not present

## 2016-02-04 DIAGNOSIS — R1311 Dysphagia, oral phase: Secondary | ICD-10-CM | POA: Diagnosis not present

## 2016-02-04 DIAGNOSIS — R1312 Dysphagia, oropharyngeal phase: Secondary | ICD-10-CM | POA: Diagnosis not present

## 2016-02-07 DIAGNOSIS — R1311 Dysphagia, oral phase: Secondary | ICD-10-CM | POA: Diagnosis not present

## 2016-02-07 DIAGNOSIS — R1312 Dysphagia, oropharyngeal phase: Secondary | ICD-10-CM | POA: Diagnosis not present

## 2016-02-08 DIAGNOSIS — R1311 Dysphagia, oral phase: Secondary | ICD-10-CM | POA: Diagnosis not present

## 2016-02-08 DIAGNOSIS — R1312 Dysphagia, oropharyngeal phase: Secondary | ICD-10-CM | POA: Diagnosis not present

## 2016-02-09 DIAGNOSIS — R1311 Dysphagia, oral phase: Secondary | ICD-10-CM | POA: Diagnosis not present

## 2016-02-09 DIAGNOSIS — R1312 Dysphagia, oropharyngeal phase: Secondary | ICD-10-CM | POA: Diagnosis not present

## 2016-02-10 DIAGNOSIS — R1312 Dysphagia, oropharyngeal phase: Secondary | ICD-10-CM | POA: Diagnosis not present

## 2016-02-10 DIAGNOSIS — R1311 Dysphagia, oral phase: Secondary | ICD-10-CM | POA: Diagnosis not present

## 2016-02-11 DIAGNOSIS — R1312 Dysphagia, oropharyngeal phase: Secondary | ICD-10-CM | POA: Diagnosis not present

## 2016-02-11 DIAGNOSIS — R1311 Dysphagia, oral phase: Secondary | ICD-10-CM | POA: Diagnosis not present

## 2016-02-14 DIAGNOSIS — R1312 Dysphagia, oropharyngeal phase: Secondary | ICD-10-CM | POA: Diagnosis not present

## 2016-02-14 DIAGNOSIS — R1311 Dysphagia, oral phase: Secondary | ICD-10-CM | POA: Diagnosis not present

## 2016-02-15 DIAGNOSIS — R1311 Dysphagia, oral phase: Secondary | ICD-10-CM | POA: Diagnosis not present

## 2016-02-15 DIAGNOSIS — R1312 Dysphagia, oropharyngeal phase: Secondary | ICD-10-CM | POA: Diagnosis not present

## 2016-02-16 DIAGNOSIS — R1312 Dysphagia, oropharyngeal phase: Secondary | ICD-10-CM | POA: Diagnosis not present

## 2016-02-16 DIAGNOSIS — R1311 Dysphagia, oral phase: Secondary | ICD-10-CM | POA: Diagnosis not present

## 2016-02-17 DIAGNOSIS — R1312 Dysphagia, oropharyngeal phase: Secondary | ICD-10-CM | POA: Diagnosis not present

## 2016-02-17 DIAGNOSIS — F331 Major depressive disorder, recurrent, moderate: Secondary | ICD-10-CM | POA: Diagnosis not present

## 2016-02-17 DIAGNOSIS — R1311 Dysphagia, oral phase: Secondary | ICD-10-CM | POA: Diagnosis not present

## 2016-02-18 DIAGNOSIS — R1311 Dysphagia, oral phase: Secondary | ICD-10-CM | POA: Diagnosis not present

## 2016-02-18 DIAGNOSIS — R1312 Dysphagia, oropharyngeal phase: Secondary | ICD-10-CM | POA: Diagnosis not present

## 2016-02-21 DIAGNOSIS — R1311 Dysphagia, oral phase: Secondary | ICD-10-CM | POA: Diagnosis not present

## 2016-02-21 DIAGNOSIS — R1312 Dysphagia, oropharyngeal phase: Secondary | ICD-10-CM | POA: Diagnosis not present

## 2016-02-22 DIAGNOSIS — R1312 Dysphagia, oropharyngeal phase: Secondary | ICD-10-CM | POA: Diagnosis not present

## 2016-02-22 DIAGNOSIS — R1311 Dysphagia, oral phase: Secondary | ICD-10-CM | POA: Diagnosis not present

## 2016-02-23 DIAGNOSIS — R1311 Dysphagia, oral phase: Secondary | ICD-10-CM | POA: Diagnosis not present

## 2016-02-23 DIAGNOSIS — R1312 Dysphagia, oropharyngeal phase: Secondary | ICD-10-CM | POA: Diagnosis not present

## 2016-02-24 DIAGNOSIS — R1311 Dysphagia, oral phase: Secondary | ICD-10-CM | POA: Diagnosis not present

## 2016-02-24 DIAGNOSIS — R1312 Dysphagia, oropharyngeal phase: Secondary | ICD-10-CM | POA: Diagnosis not present

## 2016-02-25 DIAGNOSIS — R1311 Dysphagia, oral phase: Secondary | ICD-10-CM | POA: Diagnosis not present

## 2016-02-25 DIAGNOSIS — R1312 Dysphagia, oropharyngeal phase: Secondary | ICD-10-CM | POA: Diagnosis not present

## 2016-02-28 DIAGNOSIS — R1312 Dysphagia, oropharyngeal phase: Secondary | ICD-10-CM | POA: Diagnosis not present

## 2016-02-28 DIAGNOSIS — R1311 Dysphagia, oral phase: Secondary | ICD-10-CM | POA: Diagnosis not present

## 2016-02-28 DIAGNOSIS — M79671 Pain in right foot: Secondary | ICD-10-CM | POA: Diagnosis not present

## 2016-02-28 DIAGNOSIS — B351 Tinea unguium: Secondary | ICD-10-CM | POA: Diagnosis not present

## 2016-02-28 DIAGNOSIS — M79672 Pain in left foot: Secondary | ICD-10-CM | POA: Diagnosis not present

## 2016-02-29 DIAGNOSIS — R1312 Dysphagia, oropharyngeal phase: Secondary | ICD-10-CM | POA: Diagnosis not present

## 2016-02-29 DIAGNOSIS — R1311 Dysphagia, oral phase: Secondary | ICD-10-CM | POA: Diagnosis not present

## 2016-02-29 DIAGNOSIS — R278 Other lack of coordination: Secondary | ICD-10-CM | POA: Diagnosis not present

## 2016-03-02 DIAGNOSIS — I10 Essential (primary) hypertension: Secondary | ICD-10-CM | POA: Diagnosis not present

## 2016-03-02 DIAGNOSIS — E785 Hyperlipidemia, unspecified: Secondary | ICD-10-CM | POA: Diagnosis not present

## 2016-03-02 DIAGNOSIS — R1084 Generalized abdominal pain: Secondary | ICD-10-CM | POA: Diagnosis not present

## 2016-03-02 DIAGNOSIS — Z79899 Other long term (current) drug therapy: Secondary | ICD-10-CM | POA: Diagnosis not present

## 2016-03-02 DIAGNOSIS — K219 Gastro-esophageal reflux disease without esophagitis: Secondary | ICD-10-CM | POA: Diagnosis not present

## 2016-03-02 DIAGNOSIS — K5900 Constipation, unspecified: Secondary | ICD-10-CM | POA: Diagnosis not present

## 2016-03-02 DIAGNOSIS — F338 Other recurrent depressive disorders: Secondary | ICD-10-CM | POA: Diagnosis not present

## 2016-03-02 DIAGNOSIS — M25569 Pain in unspecified knee: Secondary | ICD-10-CM | POA: Diagnosis not present

## 2016-03-02 DIAGNOSIS — G808 Other cerebral palsy: Secondary | ICD-10-CM | POA: Diagnosis not present

## 2016-03-02 DIAGNOSIS — E039 Hypothyroidism, unspecified: Secondary | ICD-10-CM | POA: Diagnosis not present

## 2016-03-02 DIAGNOSIS — E559 Vitamin D deficiency, unspecified: Secondary | ICD-10-CM | POA: Diagnosis not present

## 2016-03-02 DIAGNOSIS — D649 Anemia, unspecified: Secondary | ICD-10-CM | POA: Diagnosis not present

## 2016-03-02 DIAGNOSIS — H05223 Edema of bilateral orbit: Secondary | ICD-10-CM | POA: Diagnosis not present

## 2016-03-08 DIAGNOSIS — Z961 Presence of intraocular lens: Secondary | ICD-10-CM | POA: Diagnosis not present

## 2016-03-08 DIAGNOSIS — H5213 Myopia, bilateral: Secondary | ICD-10-CM | POA: Diagnosis not present

## 2016-03-08 DIAGNOSIS — G809 Cerebral palsy, unspecified: Secondary | ICD-10-CM | POA: Diagnosis not present

## 2016-03-09 DIAGNOSIS — R278 Other lack of coordination: Secondary | ICD-10-CM | POA: Diagnosis not present

## 2016-03-09 DIAGNOSIS — R1312 Dysphagia, oropharyngeal phase: Secondary | ICD-10-CM | POA: Diagnosis not present

## 2016-03-09 DIAGNOSIS — R1311 Dysphagia, oral phase: Secondary | ICD-10-CM | POA: Diagnosis not present

## 2016-03-10 DIAGNOSIS — R1311 Dysphagia, oral phase: Secondary | ICD-10-CM | POA: Diagnosis not present

## 2016-03-10 DIAGNOSIS — R278 Other lack of coordination: Secondary | ICD-10-CM | POA: Diagnosis not present

## 2016-03-10 DIAGNOSIS — R1312 Dysphagia, oropharyngeal phase: Secondary | ICD-10-CM | POA: Diagnosis not present

## 2016-03-13 DIAGNOSIS — R1311 Dysphagia, oral phase: Secondary | ICD-10-CM | POA: Diagnosis not present

## 2016-03-13 DIAGNOSIS — R278 Other lack of coordination: Secondary | ICD-10-CM | POA: Diagnosis not present

## 2016-03-13 DIAGNOSIS — R1312 Dysphagia, oropharyngeal phase: Secondary | ICD-10-CM | POA: Diagnosis not present

## 2016-03-14 DIAGNOSIS — R1311 Dysphagia, oral phase: Secondary | ICD-10-CM | POA: Diagnosis not present

## 2016-03-14 DIAGNOSIS — R278 Other lack of coordination: Secondary | ICD-10-CM | POA: Diagnosis not present

## 2016-03-14 DIAGNOSIS — R1312 Dysphagia, oropharyngeal phase: Secondary | ICD-10-CM | POA: Diagnosis not present

## 2016-03-15 DIAGNOSIS — R1312 Dysphagia, oropharyngeal phase: Secondary | ICD-10-CM | POA: Diagnosis not present

## 2016-03-15 DIAGNOSIS — R1311 Dysphagia, oral phase: Secondary | ICD-10-CM | POA: Diagnosis not present

## 2016-03-15 DIAGNOSIS — R278 Other lack of coordination: Secondary | ICD-10-CM | POA: Diagnosis not present

## 2016-03-16 DIAGNOSIS — R278 Other lack of coordination: Secondary | ICD-10-CM | POA: Diagnosis not present

## 2016-03-16 DIAGNOSIS — R1312 Dysphagia, oropharyngeal phase: Secondary | ICD-10-CM | POA: Diagnosis not present

## 2016-03-16 DIAGNOSIS — R1311 Dysphagia, oral phase: Secondary | ICD-10-CM | POA: Diagnosis not present

## 2016-03-19 DIAGNOSIS — R1312 Dysphagia, oropharyngeal phase: Secondary | ICD-10-CM | POA: Diagnosis not present

## 2016-03-19 DIAGNOSIS — R278 Other lack of coordination: Secondary | ICD-10-CM | POA: Diagnosis not present

## 2016-03-19 DIAGNOSIS — R1311 Dysphagia, oral phase: Secondary | ICD-10-CM | POA: Diagnosis not present

## 2016-03-20 DIAGNOSIS — R1311 Dysphagia, oral phase: Secondary | ICD-10-CM | POA: Diagnosis not present

## 2016-03-20 DIAGNOSIS — R1312 Dysphagia, oropharyngeal phase: Secondary | ICD-10-CM | POA: Diagnosis not present

## 2016-03-20 DIAGNOSIS — R278 Other lack of coordination: Secondary | ICD-10-CM | POA: Diagnosis not present

## 2016-03-21 DIAGNOSIS — R278 Other lack of coordination: Secondary | ICD-10-CM | POA: Diagnosis not present

## 2016-03-21 DIAGNOSIS — R1312 Dysphagia, oropharyngeal phase: Secondary | ICD-10-CM | POA: Diagnosis not present

## 2016-03-21 DIAGNOSIS — R1311 Dysphagia, oral phase: Secondary | ICD-10-CM | POA: Diagnosis not present

## 2016-03-22 DIAGNOSIS — R1312 Dysphagia, oropharyngeal phase: Secondary | ICD-10-CM | POA: Diagnosis not present

## 2016-03-22 DIAGNOSIS — R278 Other lack of coordination: Secondary | ICD-10-CM | POA: Diagnosis not present

## 2016-03-22 DIAGNOSIS — R1311 Dysphagia, oral phase: Secondary | ICD-10-CM | POA: Diagnosis not present

## 2016-03-23 DIAGNOSIS — R278 Other lack of coordination: Secondary | ICD-10-CM | POA: Diagnosis not present

## 2016-03-23 DIAGNOSIS — R1311 Dysphagia, oral phase: Secondary | ICD-10-CM | POA: Diagnosis not present

## 2016-03-23 DIAGNOSIS — R1312 Dysphagia, oropharyngeal phase: Secondary | ICD-10-CM | POA: Diagnosis not present

## 2016-03-24 DIAGNOSIS — R278 Other lack of coordination: Secondary | ICD-10-CM | POA: Diagnosis not present

## 2016-03-24 DIAGNOSIS — R1312 Dysphagia, oropharyngeal phase: Secondary | ICD-10-CM | POA: Diagnosis not present

## 2016-03-24 DIAGNOSIS — R1311 Dysphagia, oral phase: Secondary | ICD-10-CM | POA: Diagnosis not present

## 2016-03-27 DIAGNOSIS — R1312 Dysphagia, oropharyngeal phase: Secondary | ICD-10-CM | POA: Diagnosis not present

## 2016-03-27 DIAGNOSIS — R1311 Dysphagia, oral phase: Secondary | ICD-10-CM | POA: Diagnosis not present

## 2016-03-27 DIAGNOSIS — R278 Other lack of coordination: Secondary | ICD-10-CM | POA: Diagnosis not present

## 2016-03-29 DIAGNOSIS — R1312 Dysphagia, oropharyngeal phase: Secondary | ICD-10-CM | POA: Diagnosis not present

## 2016-03-29 DIAGNOSIS — R1311 Dysphagia, oral phase: Secondary | ICD-10-CM | POA: Diagnosis not present

## 2016-03-29 DIAGNOSIS — R278 Other lack of coordination: Secondary | ICD-10-CM | POA: Diagnosis not present

## 2016-03-30 DIAGNOSIS — R278 Other lack of coordination: Secondary | ICD-10-CM | POA: Diagnosis not present

## 2016-03-30 DIAGNOSIS — R1311 Dysphagia, oral phase: Secondary | ICD-10-CM | POA: Diagnosis not present

## 2016-03-30 DIAGNOSIS — R1312 Dysphagia, oropharyngeal phase: Secondary | ICD-10-CM | POA: Diagnosis not present

## 2016-03-31 DIAGNOSIS — R278 Other lack of coordination: Secondary | ICD-10-CM | POA: Diagnosis not present

## 2016-03-31 DIAGNOSIS — R1311 Dysphagia, oral phase: Secondary | ICD-10-CM | POA: Diagnosis not present

## 2016-04-01 DIAGNOSIS — R278 Other lack of coordination: Secondary | ICD-10-CM | POA: Diagnosis not present

## 2016-04-01 DIAGNOSIS — R1311 Dysphagia, oral phase: Secondary | ICD-10-CM | POA: Diagnosis not present

## 2016-04-02 DIAGNOSIS — R278 Other lack of coordination: Secondary | ICD-10-CM | POA: Diagnosis not present

## 2016-04-02 DIAGNOSIS — R1311 Dysphagia, oral phase: Secondary | ICD-10-CM | POA: Diagnosis not present

## 2016-04-04 DIAGNOSIS — R1311 Dysphagia, oral phase: Secondary | ICD-10-CM | POA: Diagnosis not present

## 2016-04-04 DIAGNOSIS — R278 Other lack of coordination: Secondary | ICD-10-CM | POA: Diagnosis not present

## 2016-04-13 DIAGNOSIS — F331 Major depressive disorder, recurrent, moderate: Secondary | ICD-10-CM | POA: Diagnosis not present

## 2016-04-20 DIAGNOSIS — K219 Gastro-esophageal reflux disease without esophagitis: Secondary | ICD-10-CM | POA: Diagnosis not present

## 2016-04-20 DIAGNOSIS — G809 Cerebral palsy, unspecified: Secondary | ICD-10-CM | POA: Diagnosis not present

## 2016-04-20 DIAGNOSIS — K5900 Constipation, unspecified: Secondary | ICD-10-CM | POA: Diagnosis not present

## 2016-04-20 DIAGNOSIS — R197 Diarrhea, unspecified: Secondary | ICD-10-CM | POA: Diagnosis not present

## 2016-05-02 DIAGNOSIS — M6281 Muscle weakness (generalized): Secondary | ICD-10-CM | POA: Diagnosis not present

## 2016-05-02 DIAGNOSIS — M24561 Contracture, right knee: Secondary | ICD-10-CM | POA: Diagnosis not present

## 2016-05-02 DIAGNOSIS — M24562 Contracture, left knee: Secondary | ICD-10-CM | POA: Diagnosis not present

## 2016-05-03 DIAGNOSIS — M6281 Muscle weakness (generalized): Secondary | ICD-10-CM | POA: Diagnosis not present

## 2016-05-03 DIAGNOSIS — M24562 Contracture, left knee: Secondary | ICD-10-CM | POA: Diagnosis not present

## 2016-05-03 DIAGNOSIS — M24561 Contracture, right knee: Secondary | ICD-10-CM | POA: Diagnosis not present

## 2016-05-04 DIAGNOSIS — M6281 Muscle weakness (generalized): Secondary | ICD-10-CM | POA: Diagnosis not present

## 2016-05-04 DIAGNOSIS — M24562 Contracture, left knee: Secondary | ICD-10-CM | POA: Diagnosis not present

## 2016-05-04 DIAGNOSIS — M24561 Contracture, right knee: Secondary | ICD-10-CM | POA: Diagnosis not present

## 2016-05-05 DIAGNOSIS — M24562 Contracture, left knee: Secondary | ICD-10-CM | POA: Diagnosis not present

## 2016-05-05 DIAGNOSIS — M6281 Muscle weakness (generalized): Secondary | ICD-10-CM | POA: Diagnosis not present

## 2016-05-05 DIAGNOSIS — M24561 Contracture, right knee: Secondary | ICD-10-CM | POA: Diagnosis not present

## 2016-05-08 DIAGNOSIS — M6281 Muscle weakness (generalized): Secondary | ICD-10-CM | POA: Diagnosis not present

## 2016-05-08 DIAGNOSIS — M24561 Contracture, right knee: Secondary | ICD-10-CM | POA: Diagnosis not present

## 2016-05-08 DIAGNOSIS — M24562 Contracture, left knee: Secondary | ICD-10-CM | POA: Diagnosis not present

## 2016-05-09 DIAGNOSIS — M24561 Contracture, right knee: Secondary | ICD-10-CM | POA: Diagnosis not present

## 2016-05-09 DIAGNOSIS — M24562 Contracture, left knee: Secondary | ICD-10-CM | POA: Diagnosis not present

## 2016-05-09 DIAGNOSIS — M6281 Muscle weakness (generalized): Secondary | ICD-10-CM | POA: Diagnosis not present

## 2016-05-10 DIAGNOSIS — M24562 Contracture, left knee: Secondary | ICD-10-CM | POA: Diagnosis not present

## 2016-05-10 DIAGNOSIS — M6281 Muscle weakness (generalized): Secondary | ICD-10-CM | POA: Diagnosis not present

## 2016-05-10 DIAGNOSIS — M24561 Contracture, right knee: Secondary | ICD-10-CM | POA: Diagnosis not present

## 2016-05-11 DIAGNOSIS — M6281 Muscle weakness (generalized): Secondary | ICD-10-CM | POA: Diagnosis not present

## 2016-05-11 DIAGNOSIS — M24561 Contracture, right knee: Secondary | ICD-10-CM | POA: Diagnosis not present

## 2016-05-11 DIAGNOSIS — M24562 Contracture, left knee: Secondary | ICD-10-CM | POA: Diagnosis not present

## 2016-05-12 DIAGNOSIS — M6281 Muscle weakness (generalized): Secondary | ICD-10-CM | POA: Diagnosis not present

## 2016-05-12 DIAGNOSIS — M24561 Contracture, right knee: Secondary | ICD-10-CM | POA: Diagnosis not present

## 2016-05-12 DIAGNOSIS — M24562 Contracture, left knee: Secondary | ICD-10-CM | POA: Diagnosis not present

## 2016-05-14 DIAGNOSIS — M6281 Muscle weakness (generalized): Secondary | ICD-10-CM | POA: Diagnosis not present

## 2016-05-14 DIAGNOSIS — M24561 Contracture, right knee: Secondary | ICD-10-CM | POA: Diagnosis not present

## 2016-05-14 DIAGNOSIS — M24562 Contracture, left knee: Secondary | ICD-10-CM | POA: Diagnosis not present

## 2016-05-16 DIAGNOSIS — M24561 Contracture, right knee: Secondary | ICD-10-CM | POA: Diagnosis not present

## 2016-05-16 DIAGNOSIS — M24562 Contracture, left knee: Secondary | ICD-10-CM | POA: Diagnosis not present

## 2016-05-16 DIAGNOSIS — M6281 Muscle weakness (generalized): Secondary | ICD-10-CM | POA: Diagnosis not present

## 2016-05-17 DIAGNOSIS — M6281 Muscle weakness (generalized): Secondary | ICD-10-CM | POA: Diagnosis not present

## 2016-05-17 DIAGNOSIS — M24561 Contracture, right knee: Secondary | ICD-10-CM | POA: Diagnosis not present

## 2016-05-17 DIAGNOSIS — M24562 Contracture, left knee: Secondary | ICD-10-CM | POA: Diagnosis not present

## 2016-05-18 DIAGNOSIS — M25561 Pain in right knee: Secondary | ICD-10-CM | POA: Diagnosis not present

## 2016-05-18 DIAGNOSIS — M6281 Muscle weakness (generalized): Secondary | ICD-10-CM | POA: Diagnosis not present

## 2016-05-18 DIAGNOSIS — M25562 Pain in left knee: Secondary | ICD-10-CM | POA: Diagnosis not present

## 2016-05-18 DIAGNOSIS — K5909 Other constipation: Secondary | ICD-10-CM | POA: Diagnosis not present

## 2016-05-18 DIAGNOSIS — R600 Localized edema: Secondary | ICD-10-CM | POA: Diagnosis not present

## 2016-05-18 DIAGNOSIS — F329 Major depressive disorder, single episode, unspecified: Secondary | ICD-10-CM | POA: Diagnosis not present

## 2016-05-18 DIAGNOSIS — K219 Gastro-esophageal reflux disease without esophagitis: Secondary | ICD-10-CM | POA: Diagnosis not present

## 2016-05-18 DIAGNOSIS — G808 Other cerebral palsy: Secondary | ICD-10-CM | POA: Diagnosis not present

## 2016-05-18 DIAGNOSIS — Z8719 Personal history of other diseases of the digestive system: Secondary | ICD-10-CM | POA: Diagnosis not present

## 2016-05-18 DIAGNOSIS — R1084 Generalized abdominal pain: Secondary | ICD-10-CM | POA: Diagnosis not present

## 2016-05-18 DIAGNOSIS — M24561 Contracture, right knee: Secondary | ICD-10-CM | POA: Diagnosis not present

## 2016-05-18 DIAGNOSIS — M24562 Contracture, left knee: Secondary | ICD-10-CM | POA: Diagnosis not present

## 2016-05-19 DIAGNOSIS — M24561 Contracture, right knee: Secondary | ICD-10-CM | POA: Diagnosis not present

## 2016-05-19 DIAGNOSIS — M24562 Contracture, left knee: Secondary | ICD-10-CM | POA: Diagnosis not present

## 2016-05-19 DIAGNOSIS — M6281 Muscle weakness (generalized): Secondary | ICD-10-CM | POA: Diagnosis not present

## 2016-05-22 DIAGNOSIS — M24562 Contracture, left knee: Secondary | ICD-10-CM | POA: Diagnosis not present

## 2016-05-22 DIAGNOSIS — M24561 Contracture, right knee: Secondary | ICD-10-CM | POA: Diagnosis not present

## 2016-05-22 DIAGNOSIS — M6281 Muscle weakness (generalized): Secondary | ICD-10-CM | POA: Diagnosis not present

## 2016-05-23 DIAGNOSIS — M24561 Contracture, right knee: Secondary | ICD-10-CM | POA: Diagnosis not present

## 2016-05-23 DIAGNOSIS — M24562 Contracture, left knee: Secondary | ICD-10-CM | POA: Diagnosis not present

## 2016-05-23 DIAGNOSIS — M6281 Muscle weakness (generalized): Secondary | ICD-10-CM | POA: Diagnosis not present

## 2016-05-24 DIAGNOSIS — M24561 Contracture, right knee: Secondary | ICD-10-CM | POA: Diagnosis not present

## 2016-05-24 DIAGNOSIS — M6281 Muscle weakness (generalized): Secondary | ICD-10-CM | POA: Diagnosis not present

## 2016-05-24 DIAGNOSIS — M24562 Contracture, left knee: Secondary | ICD-10-CM | POA: Diagnosis not present

## 2016-05-25 DIAGNOSIS — M6281 Muscle weakness (generalized): Secondary | ICD-10-CM | POA: Diagnosis not present

## 2016-05-25 DIAGNOSIS — F331 Major depressive disorder, recurrent, moderate: Secondary | ICD-10-CM | POA: Diagnosis not present

## 2016-05-25 DIAGNOSIS — M24561 Contracture, right knee: Secondary | ICD-10-CM | POA: Diagnosis not present

## 2016-05-25 DIAGNOSIS — M24562 Contracture, left knee: Secondary | ICD-10-CM | POA: Diagnosis not present

## 2016-05-26 DIAGNOSIS — M24561 Contracture, right knee: Secondary | ICD-10-CM | POA: Diagnosis not present

## 2016-05-26 DIAGNOSIS — M6281 Muscle weakness (generalized): Secondary | ICD-10-CM | POA: Diagnosis not present

## 2016-05-26 DIAGNOSIS — M24562 Contracture, left knee: Secondary | ICD-10-CM | POA: Diagnosis not present

## 2016-05-29 DIAGNOSIS — M6281 Muscle weakness (generalized): Secondary | ICD-10-CM | POA: Diagnosis not present

## 2016-05-29 DIAGNOSIS — M24561 Contracture, right knee: Secondary | ICD-10-CM | POA: Diagnosis not present

## 2016-05-29 DIAGNOSIS — M24562 Contracture, left knee: Secondary | ICD-10-CM | POA: Diagnosis not present

## 2016-05-30 DIAGNOSIS — M6281 Muscle weakness (generalized): Secondary | ICD-10-CM | POA: Diagnosis not present

## 2016-05-30 DIAGNOSIS — M24562 Contracture, left knee: Secondary | ICD-10-CM | POA: Diagnosis not present

## 2016-05-30 DIAGNOSIS — M24561 Contracture, right knee: Secondary | ICD-10-CM | POA: Diagnosis not present

## 2016-05-31 DIAGNOSIS — M24561 Contracture, right knee: Secondary | ICD-10-CM | POA: Diagnosis not present

## 2016-05-31 DIAGNOSIS — M24562 Contracture, left knee: Secondary | ICD-10-CM | POA: Diagnosis not present

## 2016-06-12 DIAGNOSIS — R21 Rash and other nonspecific skin eruption: Secondary | ICD-10-CM | POA: Diagnosis not present

## 2016-06-12 DIAGNOSIS — G809 Cerebral palsy, unspecified: Secondary | ICD-10-CM | POA: Diagnosis not present

## 2016-06-12 DIAGNOSIS — M1990 Unspecified osteoarthritis, unspecified site: Secondary | ICD-10-CM | POA: Diagnosis not present

## 2016-06-12 DIAGNOSIS — F341 Dysthymic disorder: Secondary | ICD-10-CM | POA: Diagnosis not present

## 2016-06-29 DIAGNOSIS — F331 Major depressive disorder, recurrent, moderate: Secondary | ICD-10-CM | POA: Diagnosis not present

## 2016-07-17 DIAGNOSIS — H05229 Edema of unspecified orbit: Secondary | ICD-10-CM | POA: Diagnosis not present

## 2016-07-17 DIAGNOSIS — R1084 Generalized abdominal pain: Secondary | ICD-10-CM | POA: Diagnosis not present

## 2016-07-17 DIAGNOSIS — K219 Gastro-esophageal reflux disease without esophagitis: Secondary | ICD-10-CM | POA: Diagnosis not present

## 2016-07-17 DIAGNOSIS — K5909 Other constipation: Secondary | ICD-10-CM | POA: Diagnosis not present

## 2016-07-17 DIAGNOSIS — M25561 Pain in right knee: Secondary | ICD-10-CM | POA: Diagnosis not present

## 2016-07-17 DIAGNOSIS — G809 Cerebral palsy, unspecified: Secondary | ICD-10-CM | POA: Diagnosis not present

## 2016-07-17 DIAGNOSIS — F329 Major depressive disorder, single episode, unspecified: Secondary | ICD-10-CM | POA: Diagnosis not present

## 2016-07-17 DIAGNOSIS — M25562 Pain in left knee: Secondary | ICD-10-CM | POA: Diagnosis not present

## 2016-08-01 DIAGNOSIS — F331 Major depressive disorder, recurrent, moderate: Secondary | ICD-10-CM | POA: Diagnosis not present

## 2016-08-10 DIAGNOSIS — F341 Dysthymic disorder: Secondary | ICD-10-CM | POA: Diagnosis not present

## 2016-08-10 DIAGNOSIS — G809 Cerebral palsy, unspecified: Secondary | ICD-10-CM | POA: Diagnosis not present

## 2016-08-10 DIAGNOSIS — R110 Nausea: Secondary | ICD-10-CM | POA: Diagnosis not present

## 2016-08-10 DIAGNOSIS — K219 Gastro-esophageal reflux disease without esophagitis: Secondary | ICD-10-CM | POA: Diagnosis not present

## 2016-08-14 DIAGNOSIS — R1311 Dysphagia, oral phase: Secondary | ICD-10-CM | POA: Diagnosis not present

## 2016-08-14 DIAGNOSIS — M24562 Contracture, left knee: Secondary | ICD-10-CM | POA: Diagnosis not present

## 2016-08-14 DIAGNOSIS — R41841 Cognitive communication deficit: Secondary | ICD-10-CM | POA: Diagnosis not present

## 2016-08-15 DIAGNOSIS — M24562 Contracture, left knee: Secondary | ICD-10-CM | POA: Diagnosis not present

## 2016-08-15 DIAGNOSIS — R41841 Cognitive communication deficit: Secondary | ICD-10-CM | POA: Diagnosis not present

## 2016-08-15 DIAGNOSIS — R1311 Dysphagia, oral phase: Secondary | ICD-10-CM | POA: Diagnosis not present

## 2016-08-16 DIAGNOSIS — K529 Noninfective gastroenteritis and colitis, unspecified: Secondary | ICD-10-CM | POA: Diagnosis not present

## 2016-08-16 DIAGNOSIS — M24562 Contracture, left knee: Secondary | ICD-10-CM | POA: Diagnosis not present

## 2016-08-16 DIAGNOSIS — R41841 Cognitive communication deficit: Secondary | ICD-10-CM | POA: Diagnosis not present

## 2016-08-16 DIAGNOSIS — F341 Dysthymic disorder: Secondary | ICD-10-CM | POA: Diagnosis not present

## 2016-08-16 DIAGNOSIS — K219 Gastro-esophageal reflux disease without esophagitis: Secondary | ICD-10-CM | POA: Diagnosis not present

## 2016-08-16 DIAGNOSIS — G809 Cerebral palsy, unspecified: Secondary | ICD-10-CM | POA: Diagnosis not present

## 2016-08-16 DIAGNOSIS — R1311 Dysphagia, oral phase: Secondary | ICD-10-CM | POA: Diagnosis not present

## 2016-08-17 DIAGNOSIS — M24562 Contracture, left knee: Secondary | ICD-10-CM | POA: Diagnosis not present

## 2016-08-17 DIAGNOSIS — R1311 Dysphagia, oral phase: Secondary | ICD-10-CM | POA: Diagnosis not present

## 2016-08-17 DIAGNOSIS — R41841 Cognitive communication deficit: Secondary | ICD-10-CM | POA: Diagnosis not present

## 2016-08-18 DIAGNOSIS — M24562 Contracture, left knee: Secondary | ICD-10-CM | POA: Diagnosis not present

## 2016-08-18 DIAGNOSIS — R41841 Cognitive communication deficit: Secondary | ICD-10-CM | POA: Diagnosis not present

## 2016-08-18 DIAGNOSIS — R1311 Dysphagia, oral phase: Secondary | ICD-10-CM | POA: Diagnosis not present

## 2016-08-21 DIAGNOSIS — M24562 Contracture, left knee: Secondary | ICD-10-CM | POA: Diagnosis not present

## 2016-08-21 DIAGNOSIS — R1311 Dysphagia, oral phase: Secondary | ICD-10-CM | POA: Diagnosis not present

## 2016-08-21 DIAGNOSIS — R41841 Cognitive communication deficit: Secondary | ICD-10-CM | POA: Diagnosis not present

## 2016-08-22 DIAGNOSIS — R1311 Dysphagia, oral phase: Secondary | ICD-10-CM | POA: Diagnosis not present

## 2016-08-22 DIAGNOSIS — R41841 Cognitive communication deficit: Secondary | ICD-10-CM | POA: Diagnosis not present

## 2016-08-22 DIAGNOSIS — M24562 Contracture, left knee: Secondary | ICD-10-CM | POA: Diagnosis not present

## 2016-08-23 DIAGNOSIS — R1311 Dysphagia, oral phase: Secondary | ICD-10-CM | POA: Diagnosis not present

## 2016-08-23 DIAGNOSIS — R41841 Cognitive communication deficit: Secondary | ICD-10-CM | POA: Diagnosis not present

## 2016-08-23 DIAGNOSIS — M24562 Contracture, left knee: Secondary | ICD-10-CM | POA: Diagnosis not present

## 2016-08-25 DIAGNOSIS — R1311 Dysphagia, oral phase: Secondary | ICD-10-CM | POA: Diagnosis not present

## 2016-08-25 DIAGNOSIS — M24562 Contracture, left knee: Secondary | ICD-10-CM | POA: Diagnosis not present

## 2016-08-25 DIAGNOSIS — R41841 Cognitive communication deficit: Secondary | ICD-10-CM | POA: Diagnosis not present

## 2016-08-26 DIAGNOSIS — R1311 Dysphagia, oral phase: Secondary | ICD-10-CM | POA: Diagnosis not present

## 2016-08-26 DIAGNOSIS — M24562 Contracture, left knee: Secondary | ICD-10-CM | POA: Diagnosis not present

## 2016-08-26 DIAGNOSIS — R41841 Cognitive communication deficit: Secondary | ICD-10-CM | POA: Diagnosis not present

## 2016-08-28 DIAGNOSIS — R41841 Cognitive communication deficit: Secondary | ICD-10-CM | POA: Diagnosis not present

## 2016-08-28 DIAGNOSIS — M24562 Contracture, left knee: Secondary | ICD-10-CM | POA: Diagnosis not present

## 2016-08-28 DIAGNOSIS — R1311 Dysphagia, oral phase: Secondary | ICD-10-CM | POA: Diagnosis not present

## 2016-08-29 DIAGNOSIS — M24562 Contracture, left knee: Secondary | ICD-10-CM | POA: Diagnosis not present

## 2016-08-29 DIAGNOSIS — R1311 Dysphagia, oral phase: Secondary | ICD-10-CM | POA: Diagnosis not present

## 2016-08-29 DIAGNOSIS — R41841 Cognitive communication deficit: Secondary | ICD-10-CM | POA: Diagnosis not present

## 2016-08-30 DIAGNOSIS — R1311 Dysphagia, oral phase: Secondary | ICD-10-CM | POA: Diagnosis not present

## 2016-08-30 DIAGNOSIS — M24562 Contracture, left knee: Secondary | ICD-10-CM | POA: Diagnosis not present

## 2016-08-30 DIAGNOSIS — R41841 Cognitive communication deficit: Secondary | ICD-10-CM | POA: Diagnosis not present

## 2016-08-31 DIAGNOSIS — M24562 Contracture, left knee: Secondary | ICD-10-CM | POA: Diagnosis not present

## 2016-08-31 DIAGNOSIS — R41841 Cognitive communication deficit: Secondary | ICD-10-CM | POA: Diagnosis not present

## 2016-08-31 DIAGNOSIS — R1311 Dysphagia, oral phase: Secondary | ICD-10-CM | POA: Diagnosis not present

## 2016-08-31 IMAGING — RF DG SWALLOWING FUNCTION
1 series · 1 of 1 positions shown · non-contrast
Comparison: None.

CLINICAL DATA: Dysphagia and choking.

EXAM:
MODIFIED BARIUM SWALLOW
TECHNIQUE: Different consistencies of barium were administered orally to the
patient by the Speech Pathologist. Imaging of the pharynx was
performed in the lateral projection.
FLUOROSCOPY TIME:  Radiation Exposure Index (as provided by the
fluoroscopic device): 35 mGy

[Series 1: run · 1 of 1 slices shown]
[im 1/1]
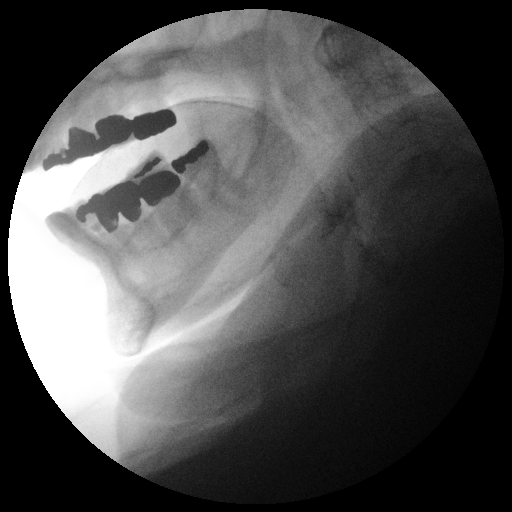

[1 of 1 positions shown; findings below may reference images not displayed]

FINDINGS: Thin Shad Sagastume initially normal drinking from a cup, but with
episodes of laryngeal penetration with straw drinking, also with
premature spill and retention.

Nectar thick liquid- laryngeal penetration with cup drinking.
Premature spill, flash penetration, and small amount of retention.

Honey- not tested

Bebay?Zeinab unremarkable

Bebay?Archbold with cracker- residue in the piriform some which AVN showed
cleared with several swallows of thin liquid

Barium tablet - poor oral control, difficulty swallowing. The
patient was able to eventually swallow with thin liquid, and pass
down to the stomach.
IMPRESSION: 1. Episodes of laryngeal penetration with thin and nectar thick
liquid. Considerable residue with various consistencies. Premature
spill with thinner liquids.
2. Poor oral control, difficulty swallowing pill except with thin
liquids.

Please refer to the Speech Pathologists report for complete details
and recommendations.

## 2016-09-02 DIAGNOSIS — R1311 Dysphagia, oral phase: Secondary | ICD-10-CM | POA: Diagnosis not present

## 2016-09-02 DIAGNOSIS — K769 Liver disease, unspecified: Secondary | ICD-10-CM | POA: Diagnosis not present

## 2016-09-02 DIAGNOSIS — D649 Anemia, unspecified: Secondary | ICD-10-CM | POA: Diagnosis not present

## 2016-09-02 DIAGNOSIS — R41841 Cognitive communication deficit: Secondary | ICD-10-CM | POA: Diagnosis not present

## 2016-09-02 DIAGNOSIS — E785 Hyperlipidemia, unspecified: Secondary | ICD-10-CM | POA: Diagnosis not present

## 2016-09-02 DIAGNOSIS — M24562 Contracture, left knee: Secondary | ICD-10-CM | POA: Diagnosis not present

## 2016-09-02 DIAGNOSIS — I10 Essential (primary) hypertension: Secondary | ICD-10-CM | POA: Diagnosis not present

## 2016-09-04 DIAGNOSIS — R41841 Cognitive communication deficit: Secondary | ICD-10-CM | POA: Diagnosis not present

## 2016-09-04 DIAGNOSIS — F331 Major depressive disorder, recurrent, moderate: Secondary | ICD-10-CM | POA: Diagnosis not present

## 2016-09-04 DIAGNOSIS — R1311 Dysphagia, oral phase: Secondary | ICD-10-CM | POA: Diagnosis not present

## 2016-09-04 DIAGNOSIS — M24562 Contracture, left knee: Secondary | ICD-10-CM | POA: Diagnosis not present

## 2016-09-05 DIAGNOSIS — R41841 Cognitive communication deficit: Secondary | ICD-10-CM | POA: Diagnosis not present

## 2016-09-05 DIAGNOSIS — R1311 Dysphagia, oral phase: Secondary | ICD-10-CM | POA: Diagnosis not present

## 2016-09-05 DIAGNOSIS — M24562 Contracture, left knee: Secondary | ICD-10-CM | POA: Diagnosis not present

## 2016-09-06 DIAGNOSIS — F331 Major depressive disorder, recurrent, moderate: Secondary | ICD-10-CM | POA: Diagnosis not present

## 2016-09-06 DIAGNOSIS — R1311 Dysphagia, oral phase: Secondary | ICD-10-CM | POA: Diagnosis not present

## 2016-09-06 DIAGNOSIS — M24562 Contracture, left knee: Secondary | ICD-10-CM | POA: Diagnosis not present

## 2016-09-06 DIAGNOSIS — F419 Anxiety disorder, unspecified: Secondary | ICD-10-CM | POA: Diagnosis not present

## 2016-09-06 DIAGNOSIS — I739 Peripheral vascular disease, unspecified: Secondary | ICD-10-CM | POA: Diagnosis not present

## 2016-09-06 DIAGNOSIS — R41841 Cognitive communication deficit: Secondary | ICD-10-CM | POA: Diagnosis not present

## 2016-09-06 DIAGNOSIS — B351 Tinea unguium: Secondary | ICD-10-CM | POA: Diagnosis not present

## 2016-09-07 DIAGNOSIS — M24562 Contracture, left knee: Secondary | ICD-10-CM | POA: Diagnosis not present

## 2016-09-07 DIAGNOSIS — R1311 Dysphagia, oral phase: Secondary | ICD-10-CM | POA: Diagnosis not present

## 2016-09-07 DIAGNOSIS — R41841 Cognitive communication deficit: Secondary | ICD-10-CM | POA: Diagnosis not present

## 2016-09-08 DIAGNOSIS — R41841 Cognitive communication deficit: Secondary | ICD-10-CM | POA: Diagnosis not present

## 2016-09-08 DIAGNOSIS — M24562 Contracture, left knee: Secondary | ICD-10-CM | POA: Diagnosis not present

## 2016-09-08 DIAGNOSIS — R1311 Dysphagia, oral phase: Secondary | ICD-10-CM | POA: Diagnosis not present

## 2016-09-11 DIAGNOSIS — M24562 Contracture, left knee: Secondary | ICD-10-CM | POA: Diagnosis not present

## 2016-09-11 DIAGNOSIS — R1311 Dysphagia, oral phase: Secondary | ICD-10-CM | POA: Diagnosis not present

## 2016-09-11 DIAGNOSIS — R41841 Cognitive communication deficit: Secondary | ICD-10-CM | POA: Diagnosis not present

## 2016-09-12 DIAGNOSIS — R1311 Dysphagia, oral phase: Secondary | ICD-10-CM | POA: Diagnosis not present

## 2016-09-12 DIAGNOSIS — M24562 Contracture, left knee: Secondary | ICD-10-CM | POA: Diagnosis not present

## 2016-09-12 DIAGNOSIS — R41841 Cognitive communication deficit: Secondary | ICD-10-CM | POA: Diagnosis not present

## 2016-09-20 DIAGNOSIS — G809 Cerebral palsy, unspecified: Secondary | ICD-10-CM | POA: Diagnosis not present

## 2016-09-20 DIAGNOSIS — F329 Major depressive disorder, single episode, unspecified: Secondary | ICD-10-CM | POA: Diagnosis not present

## 2016-09-20 DIAGNOSIS — L89610 Pressure ulcer of right heel, unstageable: Secondary | ICD-10-CM | POA: Diagnosis not present

## 2016-09-20 DIAGNOSIS — Z66 Do not resuscitate: Secondary | ICD-10-CM | POA: Diagnosis not present

## 2016-09-26 DIAGNOSIS — F341 Dysthymic disorder: Secondary | ICD-10-CM | POA: Diagnosis not present

## 2016-09-26 DIAGNOSIS — K219 Gastro-esophageal reflux disease without esophagitis: Secondary | ICD-10-CM | POA: Diagnosis not present

## 2016-09-26 DIAGNOSIS — F331 Major depressive disorder, recurrent, moderate: Secondary | ICD-10-CM | POA: Diagnosis not present

## 2016-09-26 DIAGNOSIS — G809 Cerebral palsy, unspecified: Secondary | ICD-10-CM | POA: Diagnosis not present

## 2016-09-26 DIAGNOSIS — K117 Disturbances of salivary secretion: Secondary | ICD-10-CM | POA: Diagnosis not present

## 2016-09-27 DIAGNOSIS — F331 Major depressive disorder, recurrent, moderate: Secondary | ICD-10-CM | POA: Diagnosis not present

## 2016-09-27 DIAGNOSIS — F419 Anxiety disorder, unspecified: Secondary | ICD-10-CM | POA: Diagnosis not present

## 2016-10-04 DIAGNOSIS — K219 Gastro-esophageal reflux disease without esophagitis: Secondary | ICD-10-CM | POA: Diagnosis not present

## 2016-10-04 DIAGNOSIS — F341 Dysthymic disorder: Secondary | ICD-10-CM | POA: Diagnosis not present

## 2016-10-04 DIAGNOSIS — G809 Cerebral palsy, unspecified: Secondary | ICD-10-CM | POA: Diagnosis not present

## 2016-10-04 DIAGNOSIS — L309 Dermatitis, unspecified: Secondary | ICD-10-CM | POA: Diagnosis not present

## 2016-10-17 DIAGNOSIS — R1311 Dysphagia, oral phase: Secondary | ICD-10-CM | POA: Diagnosis not present

## 2016-10-17 DIAGNOSIS — R293 Abnormal posture: Secondary | ICD-10-CM | POA: Diagnosis not present

## 2016-10-17 DIAGNOSIS — M24562 Contracture, left knee: Secondary | ICD-10-CM | POA: Diagnosis not present

## 2016-10-17 DIAGNOSIS — R278 Other lack of coordination: Secondary | ICD-10-CM | POA: Diagnosis not present

## 2016-10-17 DIAGNOSIS — G839 Paralytic syndrome, unspecified: Secondary | ICD-10-CM | POA: Diagnosis not present

## 2016-10-17 DIAGNOSIS — R29818 Other symptoms and signs involving the nervous system: Secondary | ICD-10-CM | POA: Diagnosis not present

## 2016-10-17 DIAGNOSIS — R41841 Cognitive communication deficit: Secondary | ICD-10-CM | POA: Diagnosis not present

## 2016-10-18 DIAGNOSIS — F419 Anxiety disorder, unspecified: Secondary | ICD-10-CM | POA: Diagnosis not present

## 2016-10-18 DIAGNOSIS — F331 Major depressive disorder, recurrent, moderate: Secondary | ICD-10-CM | POA: Diagnosis not present

## 2016-10-19 DIAGNOSIS — G839 Paralytic syndrome, unspecified: Secondary | ICD-10-CM | POA: Diagnosis not present

## 2016-10-19 DIAGNOSIS — M24562 Contracture, left knee: Secondary | ICD-10-CM | POA: Diagnosis not present

## 2016-10-19 DIAGNOSIS — R1311 Dysphagia, oral phase: Secondary | ICD-10-CM | POA: Diagnosis not present

## 2016-10-19 DIAGNOSIS — R293 Abnormal posture: Secondary | ICD-10-CM | POA: Diagnosis not present

## 2016-10-19 DIAGNOSIS — R29818 Other symptoms and signs involving the nervous system: Secondary | ICD-10-CM | POA: Diagnosis not present

## 2016-10-19 DIAGNOSIS — R41841 Cognitive communication deficit: Secondary | ICD-10-CM | POA: Diagnosis not present

## 2016-10-20 DIAGNOSIS — R1311 Dysphagia, oral phase: Secondary | ICD-10-CM | POA: Diagnosis not present

## 2016-10-20 DIAGNOSIS — G839 Paralytic syndrome, unspecified: Secondary | ICD-10-CM | POA: Diagnosis not present

## 2016-10-20 DIAGNOSIS — R293 Abnormal posture: Secondary | ICD-10-CM | POA: Diagnosis not present

## 2016-10-20 DIAGNOSIS — R29818 Other symptoms and signs involving the nervous system: Secondary | ICD-10-CM | POA: Diagnosis not present

## 2016-10-20 DIAGNOSIS — R41841 Cognitive communication deficit: Secondary | ICD-10-CM | POA: Diagnosis not present

## 2016-10-20 DIAGNOSIS — M24562 Contracture, left knee: Secondary | ICD-10-CM | POA: Diagnosis not present

## 2016-10-23 DIAGNOSIS — F341 Dysthymic disorder: Secondary | ICD-10-CM | POA: Diagnosis not present

## 2016-10-23 DIAGNOSIS — J029 Acute pharyngitis, unspecified: Secondary | ICD-10-CM | POA: Diagnosis not present

## 2016-10-23 DIAGNOSIS — G809 Cerebral palsy, unspecified: Secondary | ICD-10-CM | POA: Diagnosis not present

## 2016-10-23 DIAGNOSIS — L089 Local infection of the skin and subcutaneous tissue, unspecified: Secondary | ICD-10-CM | POA: Diagnosis not present

## 2016-10-24 DIAGNOSIS — R1311 Dysphagia, oral phase: Secondary | ICD-10-CM | POA: Diagnosis not present

## 2016-10-24 DIAGNOSIS — R41841 Cognitive communication deficit: Secondary | ICD-10-CM | POA: Diagnosis not present

## 2016-10-24 DIAGNOSIS — G839 Paralytic syndrome, unspecified: Secondary | ICD-10-CM | POA: Diagnosis not present

## 2016-10-24 DIAGNOSIS — R29818 Other symptoms and signs involving the nervous system: Secondary | ICD-10-CM | POA: Diagnosis not present

## 2016-10-24 DIAGNOSIS — M24562 Contracture, left knee: Secondary | ICD-10-CM | POA: Diagnosis not present

## 2016-10-24 DIAGNOSIS — R293 Abnormal posture: Secondary | ICD-10-CM | POA: Diagnosis not present

## 2016-10-24 DIAGNOSIS — F331 Major depressive disorder, recurrent, moderate: Secondary | ICD-10-CM | POA: Diagnosis not present

## 2016-10-25 DIAGNOSIS — F331 Major depressive disorder, recurrent, moderate: Secondary | ICD-10-CM | POA: Diagnosis not present

## 2016-10-25 DIAGNOSIS — F419 Anxiety disorder, unspecified: Secondary | ICD-10-CM | POA: Diagnosis not present

## 2016-10-27 DIAGNOSIS — R41841 Cognitive communication deficit: Secondary | ICD-10-CM | POA: Diagnosis not present

## 2016-10-27 DIAGNOSIS — R1311 Dysphagia, oral phase: Secondary | ICD-10-CM | POA: Diagnosis not present

## 2016-10-27 DIAGNOSIS — R293 Abnormal posture: Secondary | ICD-10-CM | POA: Diagnosis not present

## 2016-10-27 DIAGNOSIS — M24562 Contracture, left knee: Secondary | ICD-10-CM | POA: Diagnosis not present

## 2016-10-27 DIAGNOSIS — R29818 Other symptoms and signs involving the nervous system: Secondary | ICD-10-CM | POA: Diagnosis not present

## 2016-10-27 DIAGNOSIS — G839 Paralytic syndrome, unspecified: Secondary | ICD-10-CM | POA: Diagnosis not present

## 2016-10-30 ENCOUNTER — Other Ambulatory Visit (HOSPITAL_COMMUNITY): Payer: Self-pay | Admitting: Internal Medicine

## 2016-10-30 DIAGNOSIS — R293 Abnormal posture: Secondary | ICD-10-CM | POA: Diagnosis not present

## 2016-10-30 DIAGNOSIS — R41841 Cognitive communication deficit: Secondary | ICD-10-CM | POA: Diagnosis not present

## 2016-10-30 DIAGNOSIS — G839 Paralytic syndrome, unspecified: Secondary | ICD-10-CM | POA: Diagnosis not present

## 2016-10-30 DIAGNOSIS — R1311 Dysphagia, oral phase: Secondary | ICD-10-CM | POA: Diagnosis not present

## 2016-10-30 DIAGNOSIS — R1319 Other dysphagia: Secondary | ICD-10-CM

## 2016-10-30 DIAGNOSIS — R29818 Other symptoms and signs involving the nervous system: Secondary | ICD-10-CM | POA: Diagnosis not present

## 2016-10-30 DIAGNOSIS — M24562 Contracture, left knee: Secondary | ICD-10-CM | POA: Diagnosis not present

## 2016-10-30 DIAGNOSIS — R278 Other lack of coordination: Secondary | ICD-10-CM | POA: Diagnosis not present

## 2016-10-31 DIAGNOSIS — G839 Paralytic syndrome, unspecified: Secondary | ICD-10-CM | POA: Diagnosis not present

## 2016-10-31 DIAGNOSIS — R1311 Dysphagia, oral phase: Secondary | ICD-10-CM | POA: Diagnosis not present

## 2016-10-31 DIAGNOSIS — M24562 Contracture, left knee: Secondary | ICD-10-CM | POA: Diagnosis not present

## 2016-10-31 DIAGNOSIS — R293 Abnormal posture: Secondary | ICD-10-CM | POA: Diagnosis not present

## 2016-10-31 DIAGNOSIS — R29818 Other symptoms and signs involving the nervous system: Secondary | ICD-10-CM | POA: Diagnosis not present

## 2016-10-31 DIAGNOSIS — R41841 Cognitive communication deficit: Secondary | ICD-10-CM | POA: Diagnosis not present

## 2016-11-01 DIAGNOSIS — J209 Acute bronchitis, unspecified: Secondary | ICD-10-CM | POA: Diagnosis not present

## 2016-11-01 DIAGNOSIS — R29818 Other symptoms and signs involving the nervous system: Secondary | ICD-10-CM | POA: Diagnosis not present

## 2016-11-01 DIAGNOSIS — G809 Cerebral palsy, unspecified: Secondary | ICD-10-CM | POA: Diagnosis not present

## 2016-11-01 DIAGNOSIS — R1311 Dysphagia, oral phase: Secondary | ICD-10-CM | POA: Diagnosis not present

## 2016-11-01 DIAGNOSIS — F331 Major depressive disorder, recurrent, moderate: Secondary | ICD-10-CM | POA: Diagnosis not present

## 2016-11-01 DIAGNOSIS — R293 Abnormal posture: Secondary | ICD-10-CM | POA: Diagnosis not present

## 2016-11-01 DIAGNOSIS — G839 Paralytic syndrome, unspecified: Secondary | ICD-10-CM | POA: Diagnosis not present

## 2016-11-01 DIAGNOSIS — M24562 Contracture, left knee: Secondary | ICD-10-CM | POA: Diagnosis not present

## 2016-11-01 DIAGNOSIS — Z66 Do not resuscitate: Secondary | ICD-10-CM | POA: Diagnosis not present

## 2016-11-01 DIAGNOSIS — R41841 Cognitive communication deficit: Secondary | ICD-10-CM | POA: Diagnosis not present

## 2016-11-01 DIAGNOSIS — F419 Anxiety disorder, unspecified: Secondary | ICD-10-CM | POA: Diagnosis not present

## 2016-11-01 DIAGNOSIS — R05 Cough: Secondary | ICD-10-CM | POA: Diagnosis not present

## 2016-11-02 DIAGNOSIS — R293 Abnormal posture: Secondary | ICD-10-CM | POA: Diagnosis not present

## 2016-11-02 DIAGNOSIS — G839 Paralytic syndrome, unspecified: Secondary | ICD-10-CM | POA: Diagnosis not present

## 2016-11-02 DIAGNOSIS — R29818 Other symptoms and signs involving the nervous system: Secondary | ICD-10-CM | POA: Diagnosis not present

## 2016-11-02 DIAGNOSIS — M24562 Contracture, left knee: Secondary | ICD-10-CM | POA: Diagnosis not present

## 2016-11-02 DIAGNOSIS — R1311 Dysphagia, oral phase: Secondary | ICD-10-CM | POA: Diagnosis not present

## 2016-11-02 DIAGNOSIS — R41841 Cognitive communication deficit: Secondary | ICD-10-CM | POA: Diagnosis not present

## 2016-11-02 DIAGNOSIS — I10 Essential (primary) hypertension: Secondary | ICD-10-CM | POA: Diagnosis not present

## 2016-11-02 DIAGNOSIS — D649 Anemia, unspecified: Secondary | ICD-10-CM | POA: Diagnosis not present

## 2016-11-03 DIAGNOSIS — G839 Paralytic syndrome, unspecified: Secondary | ICD-10-CM | POA: Diagnosis not present

## 2016-11-03 DIAGNOSIS — R1311 Dysphagia, oral phase: Secondary | ICD-10-CM | POA: Diagnosis not present

## 2016-11-03 DIAGNOSIS — M24562 Contracture, left knee: Secondary | ICD-10-CM | POA: Diagnosis not present

## 2016-11-03 DIAGNOSIS — R41841 Cognitive communication deficit: Secondary | ICD-10-CM | POA: Diagnosis not present

## 2016-11-03 DIAGNOSIS — R29818 Other symptoms and signs involving the nervous system: Secondary | ICD-10-CM | POA: Diagnosis not present

## 2016-11-03 DIAGNOSIS — R293 Abnormal posture: Secondary | ICD-10-CM | POA: Diagnosis not present

## 2016-11-06 ENCOUNTER — Ambulatory Visit (HOSPITAL_COMMUNITY)
Admission: RE | Admit: 2016-11-06 | Discharge: 2016-11-06 | Disposition: A | Discharge status: Home / Self Care | Payer: Medicare Other | Source: Ambulatory Visit | Attending: Internal Medicine | Admitting: Internal Medicine

## 2016-11-06 DIAGNOSIS — R41841 Cognitive communication deficit: Secondary | ICD-10-CM | POA: Diagnosis not present

## 2016-11-06 DIAGNOSIS — R1311 Dysphagia, oral phase: Secondary | ICD-10-CM | POA: Diagnosis not present

## 2016-11-06 DIAGNOSIS — R293 Abnormal posture: Secondary | ICD-10-CM | POA: Diagnosis not present

## 2016-11-06 DIAGNOSIS — R1310 Dysphagia, unspecified: Secondary | ICD-10-CM | POA: Insufficient documentation

## 2016-11-06 DIAGNOSIS — R1319 Other dysphagia: Secondary | ICD-10-CM

## 2016-11-06 DIAGNOSIS — G839 Paralytic syndrome, unspecified: Secondary | ICD-10-CM | POA: Diagnosis not present

## 2016-11-06 DIAGNOSIS — M24562 Contracture, left knee: Secondary | ICD-10-CM | POA: Diagnosis not present

## 2016-11-06 DIAGNOSIS — R29818 Other symptoms and signs involving the nervous system: Secondary | ICD-10-CM | POA: Diagnosis not present

## 2016-11-06 DIAGNOSIS — R05 Cough: Secondary | ICD-10-CM | POA: Diagnosis not present

## 2016-11-06 NOTE — Progress Notes (Signed)
Modified Barium Swallow Progress Note  Patient Details  Name: [name] MRN: [id number] Date of Birth: Oct 05, 1935  Today's Date: 11/06/2016  Modified Barium Swallow completed.  Full report located under Chart Review in the Imaging Section.  Brief recommendations include the following:  Clinical Impression  [name] presents as an outpatient for a repeat MBS (prior MBS 12/08/2015) with improvements from previous study (decreased vallecular residue). She exhibits a mild, primarily motor-based, oropharyngeal dsyphagia. Oral phase impairments consisted of mildly prolonged mastication and lingual/palatal residue with regular solids. Large consecutive straw sips of thin liquids resulted in oral holding, piecemeal swallowing, and premature spillage (x1); during same swallow, noted flash penetration, likely due to large quantity. Pharyngeal swallow was characterized by mild vallecular and pyriform sinus residue with thin liquids due to reduced epiglottic deflection. Esophageal scan revealed timely transit, however MBS does not diagnose below the level of the UES (pt has diagnosed history of GERD). Educated pt and family re: results of swallow study, diet recommendations and safe swallow precautions. Emphasized the importance of upright positioning during and after mealtime. Recommend diet of Dys 3 solids, thin liquids (straws ok), meds crushed.     Swallow Evaluation Recommendations       SLP Diet Recommendations: Dysphagia 3 (Mech soft) solids;Thin liquid   Liquid Administration via: Cup;Straw   Medication Administration: Crushed with puree   Supervision: Staff to assist with self feeding;Full supervision/cueing for compensatory strategies   Compensations: Slow rate;Small sips/bites;Minimize environmental distractions   Postural Changes: Seated upright at 90 degrees;Remain semi-upright after after feeds/meals (Comment) (remain upright for 30 mins after meals)   Oral Care Recommendations: Oral  care BID        Houston Siren 11/06/2016,3:07 PM   Orbie Pyo Colvin Caroli.Ed [employer] [phone]

## 2016-11-07 DIAGNOSIS — G839 Paralytic syndrome, unspecified: Secondary | ICD-10-CM | POA: Diagnosis not present

## 2016-11-07 DIAGNOSIS — R293 Abnormal posture: Secondary | ICD-10-CM | POA: Diagnosis not present

## 2016-11-07 DIAGNOSIS — R1311 Dysphagia, oral phase: Secondary | ICD-10-CM | POA: Diagnosis not present

## 2016-11-07 DIAGNOSIS — R29818 Other symptoms and signs involving the nervous system: Secondary | ICD-10-CM | POA: Diagnosis not present

## 2016-11-07 DIAGNOSIS — R41841 Cognitive communication deficit: Secondary | ICD-10-CM | POA: Diagnosis not present

## 2016-11-07 DIAGNOSIS — M24562 Contracture, left knee: Secondary | ICD-10-CM | POA: Diagnosis not present

## 2016-11-08 DIAGNOSIS — R29818 Other symptoms and signs involving the nervous system: Secondary | ICD-10-CM | POA: Diagnosis not present

## 2016-11-08 DIAGNOSIS — M24562 Contracture, left knee: Secondary | ICD-10-CM | POA: Diagnosis not present

## 2016-11-08 DIAGNOSIS — R41841 Cognitive communication deficit: Secondary | ICD-10-CM | POA: Diagnosis not present

## 2016-11-08 DIAGNOSIS — R293 Abnormal posture: Secondary | ICD-10-CM | POA: Diagnosis not present

## 2016-11-08 DIAGNOSIS — R1311 Dysphagia, oral phase: Secondary | ICD-10-CM | POA: Diagnosis not present

## 2016-11-08 DIAGNOSIS — G839 Paralytic syndrome, unspecified: Secondary | ICD-10-CM | POA: Diagnosis not present

## 2016-11-09 DIAGNOSIS — R293 Abnormal posture: Secondary | ICD-10-CM | POA: Diagnosis not present

## 2016-11-09 DIAGNOSIS — R29818 Other symptoms and signs involving the nervous system: Secondary | ICD-10-CM | POA: Diagnosis not present

## 2016-11-09 DIAGNOSIS — M24562 Contracture, left knee: Secondary | ICD-10-CM | POA: Diagnosis not present

## 2016-11-09 DIAGNOSIS — G839 Paralytic syndrome, unspecified: Secondary | ICD-10-CM | POA: Diagnosis not present

## 2016-11-09 DIAGNOSIS — R41841 Cognitive communication deficit: Secondary | ICD-10-CM | POA: Diagnosis not present

## 2016-11-09 DIAGNOSIS — R1311 Dysphagia, oral phase: Secondary | ICD-10-CM | POA: Diagnosis not present

## 2016-11-14 DIAGNOSIS — M24562 Contracture, left knee: Secondary | ICD-10-CM | POA: Diagnosis not present

## 2016-11-14 DIAGNOSIS — R293 Abnormal posture: Secondary | ICD-10-CM | POA: Diagnosis not present

## 2016-11-14 DIAGNOSIS — G839 Paralytic syndrome, unspecified: Secondary | ICD-10-CM | POA: Diagnosis not present

## 2016-11-14 DIAGNOSIS — R1311 Dysphagia, oral phase: Secondary | ICD-10-CM | POA: Diagnosis not present

## 2016-11-14 DIAGNOSIS — R29818 Other symptoms and signs involving the nervous system: Secondary | ICD-10-CM | POA: Diagnosis not present

## 2016-11-14 DIAGNOSIS — R41841 Cognitive communication deficit: Secondary | ICD-10-CM | POA: Diagnosis not present

## 2016-11-16 DIAGNOSIS — F331 Major depressive disorder, recurrent, moderate: Secondary | ICD-10-CM | POA: Diagnosis not present

## 2016-11-16 DIAGNOSIS — M24562 Contracture, left knee: Secondary | ICD-10-CM | POA: Diagnosis not present

## 2016-11-16 DIAGNOSIS — R29818 Other symptoms and signs involving the nervous system: Secondary | ICD-10-CM | POA: Diagnosis not present

## 2016-11-16 DIAGNOSIS — G839 Paralytic syndrome, unspecified: Secondary | ICD-10-CM | POA: Diagnosis not present

## 2016-11-16 DIAGNOSIS — R41841 Cognitive communication deficit: Secondary | ICD-10-CM | POA: Diagnosis not present

## 2016-11-16 DIAGNOSIS — R293 Abnormal posture: Secondary | ICD-10-CM | POA: Diagnosis not present

## 2016-11-16 DIAGNOSIS — R1311 Dysphagia, oral phase: Secondary | ICD-10-CM | POA: Diagnosis not present

## 2016-11-17 DIAGNOSIS — M24562 Contracture, left knee: Secondary | ICD-10-CM | POA: Diagnosis not present

## 2016-11-17 DIAGNOSIS — R1311 Dysphagia, oral phase: Secondary | ICD-10-CM | POA: Diagnosis not present

## 2016-11-17 DIAGNOSIS — R29818 Other symptoms and signs involving the nervous system: Secondary | ICD-10-CM | POA: Diagnosis not present

## 2016-11-17 DIAGNOSIS — R41841 Cognitive communication deficit: Secondary | ICD-10-CM | POA: Diagnosis not present

## 2016-11-17 DIAGNOSIS — R293 Abnormal posture: Secondary | ICD-10-CM | POA: Diagnosis not present

## 2016-11-17 DIAGNOSIS — G839 Paralytic syndrome, unspecified: Secondary | ICD-10-CM | POA: Diagnosis not present

## 2016-11-21 DIAGNOSIS — R1311 Dysphagia, oral phase: Secondary | ICD-10-CM | POA: Diagnosis not present

## 2016-11-21 DIAGNOSIS — R29818 Other symptoms and signs involving the nervous system: Secondary | ICD-10-CM | POA: Diagnosis not present

## 2016-11-21 DIAGNOSIS — R293 Abnormal posture: Secondary | ICD-10-CM | POA: Diagnosis not present

## 2016-11-21 DIAGNOSIS — M24562 Contracture, left knee: Secondary | ICD-10-CM | POA: Diagnosis not present

## 2016-11-21 DIAGNOSIS — G839 Paralytic syndrome, unspecified: Secondary | ICD-10-CM | POA: Diagnosis not present

## 2016-11-21 DIAGNOSIS — R41841 Cognitive communication deficit: Secondary | ICD-10-CM | POA: Diagnosis not present

## 2016-11-22 DIAGNOSIS — R293 Abnormal posture: Secondary | ICD-10-CM | POA: Diagnosis not present

## 2016-11-22 DIAGNOSIS — R1311 Dysphagia, oral phase: Secondary | ICD-10-CM | POA: Diagnosis not present

## 2016-11-22 DIAGNOSIS — M24562 Contracture, left knee: Secondary | ICD-10-CM | POA: Diagnosis not present

## 2016-11-22 DIAGNOSIS — R29818 Other symptoms and signs involving the nervous system: Secondary | ICD-10-CM | POA: Diagnosis not present

## 2016-11-22 DIAGNOSIS — G839 Paralytic syndrome, unspecified: Secondary | ICD-10-CM | POA: Diagnosis not present

## 2016-11-22 DIAGNOSIS — R41841 Cognitive communication deficit: Secondary | ICD-10-CM | POA: Diagnosis not present

## 2016-11-23 DIAGNOSIS — R41841 Cognitive communication deficit: Secondary | ICD-10-CM | POA: Diagnosis not present

## 2016-11-23 DIAGNOSIS — R293 Abnormal posture: Secondary | ICD-10-CM | POA: Diagnosis not present

## 2016-11-23 DIAGNOSIS — M24562 Contracture, left knee: Secondary | ICD-10-CM | POA: Diagnosis not present

## 2016-11-23 DIAGNOSIS — R29818 Other symptoms and signs involving the nervous system: Secondary | ICD-10-CM | POA: Diagnosis not present

## 2016-11-23 DIAGNOSIS — G839 Paralytic syndrome, unspecified: Secondary | ICD-10-CM | POA: Diagnosis not present

## 2016-11-23 DIAGNOSIS — R1311 Dysphagia, oral phase: Secondary | ICD-10-CM | POA: Diagnosis not present

## 2016-11-24 DIAGNOSIS — G839 Paralytic syndrome, unspecified: Secondary | ICD-10-CM | POA: Diagnosis not present

## 2016-11-24 DIAGNOSIS — R1311 Dysphagia, oral phase: Secondary | ICD-10-CM | POA: Diagnosis not present

## 2016-11-24 DIAGNOSIS — R293 Abnormal posture: Secondary | ICD-10-CM | POA: Diagnosis not present

## 2016-11-24 DIAGNOSIS — R41841 Cognitive communication deficit: Secondary | ICD-10-CM | POA: Diagnosis not present

## 2016-11-24 DIAGNOSIS — R29818 Other symptoms and signs involving the nervous system: Secondary | ICD-10-CM | POA: Diagnosis not present

## 2016-11-24 DIAGNOSIS — M24562 Contracture, left knee: Secondary | ICD-10-CM | POA: Diagnosis not present

## 2016-11-27 DIAGNOSIS — G839 Paralytic syndrome, unspecified: Secondary | ICD-10-CM | POA: Diagnosis not present

## 2016-11-27 DIAGNOSIS — R41841 Cognitive communication deficit: Secondary | ICD-10-CM | POA: Diagnosis not present

## 2016-11-27 DIAGNOSIS — R1311 Dysphagia, oral phase: Secondary | ICD-10-CM | POA: Diagnosis not present

## 2016-11-27 DIAGNOSIS — M24562 Contracture, left knee: Secondary | ICD-10-CM | POA: Diagnosis not present

## 2016-11-27 DIAGNOSIS — R29818 Other symptoms and signs involving the nervous system: Secondary | ICD-10-CM | POA: Diagnosis not present

## 2016-11-27 DIAGNOSIS — R293 Abnormal posture: Secondary | ICD-10-CM | POA: Diagnosis not present

## 2016-11-28 DIAGNOSIS — G809 Cerebral palsy, unspecified: Secondary | ICD-10-CM | POA: Diagnosis not present

## 2016-11-28 DIAGNOSIS — L309 Dermatitis, unspecified: Secondary | ICD-10-CM | POA: Diagnosis not present

## 2016-11-28 DIAGNOSIS — R41841 Cognitive communication deficit: Secondary | ICD-10-CM | POA: Diagnosis not present

## 2016-11-28 DIAGNOSIS — K219 Gastro-esophageal reflux disease without esophagitis: Secondary | ICD-10-CM | POA: Diagnosis not present

## 2016-11-28 DIAGNOSIS — R293 Abnormal posture: Secondary | ICD-10-CM | POA: Diagnosis not present

## 2016-11-28 DIAGNOSIS — R29818 Other symptoms and signs involving the nervous system: Secondary | ICD-10-CM | POA: Diagnosis not present

## 2016-11-28 DIAGNOSIS — R278 Other lack of coordination: Secondary | ICD-10-CM | POA: Diagnosis not present

## 2016-11-28 DIAGNOSIS — B354 Tinea corporis: Secondary | ICD-10-CM | POA: Diagnosis not present

## 2016-11-28 DIAGNOSIS — R1311 Dysphagia, oral phase: Secondary | ICD-10-CM | POA: Diagnosis not present

## 2016-11-28 DIAGNOSIS — G839 Paralytic syndrome, unspecified: Secondary | ICD-10-CM | POA: Diagnosis not present

## 2016-11-28 DIAGNOSIS — M24562 Contracture, left knee: Secondary | ICD-10-CM | POA: Diagnosis not present

## 2016-11-29 DIAGNOSIS — R1311 Dysphagia, oral phase: Secondary | ICD-10-CM | POA: Diagnosis not present

## 2016-11-29 DIAGNOSIS — M24562 Contracture, left knee: Secondary | ICD-10-CM | POA: Diagnosis not present

## 2016-11-29 DIAGNOSIS — R293 Abnormal posture: Secondary | ICD-10-CM | POA: Diagnosis not present

## 2016-11-29 DIAGNOSIS — G839 Paralytic syndrome, unspecified: Secondary | ICD-10-CM | POA: Diagnosis not present

## 2016-11-29 DIAGNOSIS — R29818 Other symptoms and signs involving the nervous system: Secondary | ICD-10-CM | POA: Diagnosis not present

## 2016-11-29 DIAGNOSIS — R41841 Cognitive communication deficit: Secondary | ICD-10-CM | POA: Diagnosis not present

## 2016-11-30 DIAGNOSIS — M24562 Contracture, left knee: Secondary | ICD-10-CM | POA: Diagnosis not present

## 2016-11-30 DIAGNOSIS — R41841 Cognitive communication deficit: Secondary | ICD-10-CM | POA: Diagnosis not present

## 2016-11-30 DIAGNOSIS — R29818 Other symptoms and signs involving the nervous system: Secondary | ICD-10-CM | POA: Diagnosis not present

## 2016-11-30 DIAGNOSIS — R293 Abnormal posture: Secondary | ICD-10-CM | POA: Diagnosis not present

## 2016-11-30 DIAGNOSIS — R1311 Dysphagia, oral phase: Secondary | ICD-10-CM | POA: Diagnosis not present

## 2016-11-30 DIAGNOSIS — G839 Paralytic syndrome, unspecified: Secondary | ICD-10-CM | POA: Diagnosis not present

## 2016-12-01 DIAGNOSIS — R293 Abnormal posture: Secondary | ICD-10-CM | POA: Diagnosis not present

## 2016-12-01 DIAGNOSIS — M24562 Contracture, left knee: Secondary | ICD-10-CM | POA: Diagnosis not present

## 2016-12-01 DIAGNOSIS — R29818 Other symptoms and signs involving the nervous system: Secondary | ICD-10-CM | POA: Diagnosis not present

## 2016-12-01 DIAGNOSIS — R1311 Dysphagia, oral phase: Secondary | ICD-10-CM | POA: Diagnosis not present

## 2016-12-01 DIAGNOSIS — G839 Paralytic syndrome, unspecified: Secondary | ICD-10-CM | POA: Diagnosis not present

## 2016-12-01 DIAGNOSIS — R41841 Cognitive communication deficit: Secondary | ICD-10-CM | POA: Diagnosis not present

## 2016-12-02 DIAGNOSIS — R1311 Dysphagia, oral phase: Secondary | ICD-10-CM | POA: Diagnosis not present

## 2016-12-02 DIAGNOSIS — R41841 Cognitive communication deficit: Secondary | ICD-10-CM | POA: Diagnosis not present

## 2016-12-02 DIAGNOSIS — M24562 Contracture, left knee: Secondary | ICD-10-CM | POA: Diagnosis not present

## 2016-12-02 DIAGNOSIS — R293 Abnormal posture: Secondary | ICD-10-CM | POA: Diagnosis not present

## 2016-12-02 DIAGNOSIS — G839 Paralytic syndrome, unspecified: Secondary | ICD-10-CM | POA: Diagnosis not present

## 2016-12-02 DIAGNOSIS — R29818 Other symptoms and signs involving the nervous system: Secondary | ICD-10-CM | POA: Diagnosis not present

## 2016-12-04 DIAGNOSIS — M24562 Contracture, left knee: Secondary | ICD-10-CM | POA: Diagnosis not present

## 2016-12-04 DIAGNOSIS — R293 Abnormal posture: Secondary | ICD-10-CM | POA: Diagnosis not present

## 2016-12-04 DIAGNOSIS — R41841 Cognitive communication deficit: Secondary | ICD-10-CM | POA: Diagnosis not present

## 2016-12-04 DIAGNOSIS — G839 Paralytic syndrome, unspecified: Secondary | ICD-10-CM | POA: Diagnosis not present

## 2016-12-04 DIAGNOSIS — R1311 Dysphagia, oral phase: Secondary | ICD-10-CM | POA: Diagnosis not present

## 2016-12-04 DIAGNOSIS — R29818 Other symptoms and signs involving the nervous system: Secondary | ICD-10-CM | POA: Diagnosis not present

## 2016-12-05 DIAGNOSIS — R29818 Other symptoms and signs involving the nervous system: Secondary | ICD-10-CM | POA: Diagnosis not present

## 2016-12-05 DIAGNOSIS — R293 Abnormal posture: Secondary | ICD-10-CM | POA: Diagnosis not present

## 2016-12-05 DIAGNOSIS — R1311 Dysphagia, oral phase: Secondary | ICD-10-CM | POA: Diagnosis not present

## 2016-12-05 DIAGNOSIS — G839 Paralytic syndrome, unspecified: Secondary | ICD-10-CM | POA: Diagnosis not present

## 2016-12-05 DIAGNOSIS — R41841 Cognitive communication deficit: Secondary | ICD-10-CM | POA: Diagnosis not present

## 2016-12-05 DIAGNOSIS — M24562 Contracture, left knee: Secondary | ICD-10-CM | POA: Diagnosis not present

## 2016-12-06 DIAGNOSIS — R41841 Cognitive communication deficit: Secondary | ICD-10-CM | POA: Diagnosis not present

## 2016-12-06 DIAGNOSIS — R293 Abnormal posture: Secondary | ICD-10-CM | POA: Diagnosis not present

## 2016-12-06 DIAGNOSIS — M24562 Contracture, left knee: Secondary | ICD-10-CM | POA: Diagnosis not present

## 2016-12-06 DIAGNOSIS — R1311 Dysphagia, oral phase: Secondary | ICD-10-CM | POA: Diagnosis not present

## 2016-12-06 DIAGNOSIS — F419 Anxiety disorder, unspecified: Secondary | ICD-10-CM | POA: Diagnosis not present

## 2016-12-06 DIAGNOSIS — G839 Paralytic syndrome, unspecified: Secondary | ICD-10-CM | POA: Diagnosis not present

## 2016-12-06 DIAGNOSIS — R29818 Other symptoms and signs involving the nervous system: Secondary | ICD-10-CM | POA: Diagnosis not present

## 2016-12-06 DIAGNOSIS — F331 Major depressive disorder, recurrent, moderate: Secondary | ICD-10-CM | POA: Diagnosis not present

## 2016-12-07 DIAGNOSIS — M24562 Contracture, left knee: Secondary | ICD-10-CM | POA: Diagnosis not present

## 2016-12-07 DIAGNOSIS — R41841 Cognitive communication deficit: Secondary | ICD-10-CM | POA: Diagnosis not present

## 2016-12-07 DIAGNOSIS — R293 Abnormal posture: Secondary | ICD-10-CM | POA: Diagnosis not present

## 2016-12-07 DIAGNOSIS — G839 Paralytic syndrome, unspecified: Secondary | ICD-10-CM | POA: Diagnosis not present

## 2016-12-07 DIAGNOSIS — R29818 Other symptoms and signs involving the nervous system: Secondary | ICD-10-CM | POA: Diagnosis not present

## 2016-12-07 DIAGNOSIS — R1311 Dysphagia, oral phase: Secondary | ICD-10-CM | POA: Diagnosis not present

## 2016-12-08 DIAGNOSIS — R41841 Cognitive communication deficit: Secondary | ICD-10-CM | POA: Diagnosis not present

## 2016-12-08 DIAGNOSIS — R29818 Other symptoms and signs involving the nervous system: Secondary | ICD-10-CM | POA: Diagnosis not present

## 2016-12-08 DIAGNOSIS — R1311 Dysphagia, oral phase: Secondary | ICD-10-CM | POA: Diagnosis not present

## 2016-12-08 DIAGNOSIS — G839 Paralytic syndrome, unspecified: Secondary | ICD-10-CM | POA: Diagnosis not present

## 2016-12-08 DIAGNOSIS — R293 Abnormal posture: Secondary | ICD-10-CM | POA: Diagnosis not present

## 2016-12-08 DIAGNOSIS — M24562 Contracture, left knee: Secondary | ICD-10-CM | POA: Diagnosis not present

## 2016-12-11 DIAGNOSIS — R41841 Cognitive communication deficit: Secondary | ICD-10-CM | POA: Diagnosis not present

## 2016-12-11 DIAGNOSIS — R1311 Dysphagia, oral phase: Secondary | ICD-10-CM | POA: Diagnosis not present

## 2016-12-11 DIAGNOSIS — R293 Abnormal posture: Secondary | ICD-10-CM | POA: Diagnosis not present

## 2016-12-11 DIAGNOSIS — R29818 Other symptoms and signs involving the nervous system: Secondary | ICD-10-CM | POA: Diagnosis not present

## 2016-12-11 DIAGNOSIS — G839 Paralytic syndrome, unspecified: Secondary | ICD-10-CM | POA: Diagnosis not present

## 2016-12-11 DIAGNOSIS — M24562 Contracture, left knee: Secondary | ICD-10-CM | POA: Diagnosis not present

## 2016-12-13 DIAGNOSIS — F331 Major depressive disorder, recurrent, moderate: Secondary | ICD-10-CM | POA: Diagnosis not present

## 2016-12-13 DIAGNOSIS — F419 Anxiety disorder, unspecified: Secondary | ICD-10-CM | POA: Diagnosis not present

## 2016-12-18 DIAGNOSIS — B351 Tinea unguium: Secondary | ICD-10-CM | POA: Diagnosis not present

## 2016-12-18 DIAGNOSIS — K219 Gastro-esophageal reflux disease without esophagitis: Secondary | ICD-10-CM | POA: Diagnosis not present

## 2016-12-18 DIAGNOSIS — G809 Cerebral palsy, unspecified: Secondary | ICD-10-CM | POA: Diagnosis not present

## 2016-12-18 DIAGNOSIS — F341 Dysthymic disorder: Secondary | ICD-10-CM | POA: Diagnosis not present

## 2016-12-27 DIAGNOSIS — F331 Major depressive disorder, recurrent, moderate: Secondary | ICD-10-CM | POA: Diagnosis not present

## 2016-12-27 DIAGNOSIS — F419 Anxiety disorder, unspecified: Secondary | ICD-10-CM | POA: Diagnosis not present

## 2017-01-03 DIAGNOSIS — D0462 Carcinoma in situ of skin of left upper limb, including shoulder: Secondary | ICD-10-CM | POA: Diagnosis not present

## 2017-01-03 DIAGNOSIS — L570 Actinic keratosis: Secondary | ICD-10-CM | POA: Diagnosis not present

## 2017-01-03 DIAGNOSIS — G809 Cerebral palsy, unspecified: Secondary | ICD-10-CM | POA: Diagnosis not present

## 2017-01-03 DIAGNOSIS — X32XXXA Exposure to sunlight, initial encounter: Secondary | ICD-10-CM | POA: Diagnosis not present

## 2017-01-03 DIAGNOSIS — R208 Other disturbances of skin sensation: Secondary | ICD-10-CM | POA: Diagnosis not present

## 2017-01-03 DIAGNOSIS — Z66 Do not resuscitate: Secondary | ICD-10-CM | POA: Diagnosis not present

## 2017-01-03 DIAGNOSIS — F329 Major depressive disorder, single episode, unspecified: Secondary | ICD-10-CM | POA: Diagnosis not present

## 2017-01-03 DIAGNOSIS — F331 Major depressive disorder, recurrent, moderate: Secondary | ICD-10-CM | POA: Diagnosis not present

## 2017-01-03 DIAGNOSIS — F419 Anxiety disorder, unspecified: Secondary | ICD-10-CM | POA: Diagnosis not present

## 2017-01-05 DIAGNOSIS — F341 Dysthymic disorder: Secondary | ICD-10-CM | POA: Diagnosis not present

## 2017-01-05 DIAGNOSIS — C44629 Squamous cell carcinoma of skin of left upper limb, including shoulder: Secondary | ICD-10-CM | POA: Diagnosis not present

## 2017-01-05 DIAGNOSIS — K219 Gastro-esophageal reflux disease without esophagitis: Secondary | ICD-10-CM | POA: Diagnosis not present

## 2017-01-05 DIAGNOSIS — G809 Cerebral palsy, unspecified: Secondary | ICD-10-CM | POA: Diagnosis not present

## 2017-01-10 DIAGNOSIS — F419 Anxiety disorder, unspecified: Secondary | ICD-10-CM | POA: Diagnosis not present

## 2017-01-10 DIAGNOSIS — F331 Major depressive disorder, recurrent, moderate: Secondary | ICD-10-CM | POA: Diagnosis not present

## 2017-01-11 DIAGNOSIS — F331 Major depressive disorder, recurrent, moderate: Secondary | ICD-10-CM | POA: Diagnosis not present

## 2017-01-17 DIAGNOSIS — F419 Anxiety disorder, unspecified: Secondary | ICD-10-CM | POA: Diagnosis not present

## 2017-01-17 DIAGNOSIS — F331 Major depressive disorder, recurrent, moderate: Secondary | ICD-10-CM | POA: Diagnosis not present

## 2017-01-24 DIAGNOSIS — F331 Major depressive disorder, recurrent, moderate: Secondary | ICD-10-CM | POA: Diagnosis not present

## 2017-01-24 DIAGNOSIS — F419 Anxiety disorder, unspecified: Secondary | ICD-10-CM | POA: Diagnosis not present

## 2017-01-31 DIAGNOSIS — F331 Major depressive disorder, recurrent, moderate: Secondary | ICD-10-CM | POA: Diagnosis not present

## 2017-01-31 DIAGNOSIS — F419 Anxiety disorder, unspecified: Secondary | ICD-10-CM | POA: Diagnosis not present

## 2017-02-07 DIAGNOSIS — F331 Major depressive disorder, recurrent, moderate: Secondary | ICD-10-CM | POA: Diagnosis not present

## 2017-02-07 DIAGNOSIS — F419 Anxiety disorder, unspecified: Secondary | ICD-10-CM | POA: Diagnosis not present

## 2017-02-09 DIAGNOSIS — F331 Major depressive disorder, recurrent, moderate: Secondary | ICD-10-CM | POA: Diagnosis not present

## 2017-02-13 DIAGNOSIS — Z08 Encounter for follow-up examination after completed treatment for malignant neoplasm: Secondary | ICD-10-CM | POA: Diagnosis not present

## 2017-02-13 DIAGNOSIS — R1311 Dysphagia, oral phase: Secondary | ICD-10-CM | POA: Diagnosis not present

## 2017-02-13 DIAGNOSIS — Z85828 Personal history of other malignant neoplasm of skin: Secondary | ICD-10-CM | POA: Diagnosis not present

## 2017-02-13 DIAGNOSIS — R278 Other lack of coordination: Secondary | ICD-10-CM | POA: Diagnosis not present

## 2017-02-13 DIAGNOSIS — G839 Paralytic syndrome, unspecified: Secondary | ICD-10-CM | POA: Diagnosis not present

## 2017-02-13 DIAGNOSIS — M24561 Contracture, right knee: Secondary | ICD-10-CM | POA: Diagnosis not present

## 2017-02-13 DIAGNOSIS — R41841 Cognitive communication deficit: Secondary | ICD-10-CM | POA: Diagnosis not present

## 2017-02-13 DIAGNOSIS — R29818 Other symptoms and signs involving the nervous system: Secondary | ICD-10-CM | POA: Diagnosis not present

## 2017-02-13 DIAGNOSIS — M24562 Contracture, left knee: Secondary | ICD-10-CM | POA: Diagnosis not present

## 2017-02-14 DIAGNOSIS — F419 Anxiety disorder, unspecified: Secondary | ICD-10-CM | POA: Diagnosis not present

## 2017-02-14 DIAGNOSIS — F331 Major depressive disorder, recurrent, moderate: Secondary | ICD-10-CM | POA: Diagnosis not present

## 2017-02-15 DIAGNOSIS — M25571 Pain in right ankle and joints of right foot: Secondary | ICD-10-CM | POA: Diagnosis not present

## 2017-02-15 DIAGNOSIS — M25551 Pain in right hip: Secondary | ICD-10-CM | POA: Diagnosis not present

## 2017-02-15 DIAGNOSIS — M24561 Contracture, right knee: Secondary | ICD-10-CM | POA: Diagnosis not present

## 2017-02-15 DIAGNOSIS — G809 Cerebral palsy, unspecified: Secondary | ICD-10-CM | POA: Diagnosis not present

## 2017-02-15 DIAGNOSIS — K219 Gastro-esophageal reflux disease without esophagitis: Secondary | ICD-10-CM | POA: Diagnosis not present

## 2017-02-15 DIAGNOSIS — F341 Dysthymic disorder: Secondary | ICD-10-CM | POA: Diagnosis not present

## 2017-02-15 DIAGNOSIS — M25561 Pain in right knee: Secondary | ICD-10-CM | POA: Diagnosis not present

## 2017-02-15 DIAGNOSIS — G839 Paralytic syndrome, unspecified: Secondary | ICD-10-CM | POA: Diagnosis not present

## 2017-02-15 DIAGNOSIS — R1311 Dysphagia, oral phase: Secondary | ICD-10-CM | POA: Diagnosis not present

## 2017-02-15 DIAGNOSIS — M24562 Contracture, left knee: Secondary | ICD-10-CM | POA: Diagnosis not present

## 2017-02-15 DIAGNOSIS — R41841 Cognitive communication deficit: Secondary | ICD-10-CM | POA: Diagnosis not present

## 2017-02-15 DIAGNOSIS — M79651 Pain in right thigh: Secondary | ICD-10-CM | POA: Diagnosis not present

## 2017-02-15 DIAGNOSIS — R29818 Other symptoms and signs involving the nervous system: Secondary | ICD-10-CM | POA: Diagnosis not present

## 2017-02-16 DIAGNOSIS — R1311 Dysphagia, oral phase: Secondary | ICD-10-CM | POA: Diagnosis not present

## 2017-02-16 DIAGNOSIS — M24561 Contracture, right knee: Secondary | ICD-10-CM | POA: Diagnosis not present

## 2017-02-16 DIAGNOSIS — K219 Gastro-esophageal reflux disease without esophagitis: Secondary | ICD-10-CM | POA: Diagnosis not present

## 2017-02-16 DIAGNOSIS — R29818 Other symptoms and signs involving the nervous system: Secondary | ICD-10-CM | POA: Diagnosis not present

## 2017-02-16 DIAGNOSIS — G839 Paralytic syndrome, unspecified: Secondary | ICD-10-CM | POA: Diagnosis not present

## 2017-02-16 DIAGNOSIS — G809 Cerebral palsy, unspecified: Secondary | ICD-10-CM | POA: Diagnosis not present

## 2017-02-16 DIAGNOSIS — M24562 Contracture, left knee: Secondary | ICD-10-CM | POA: Diagnosis not present

## 2017-02-16 DIAGNOSIS — M25551 Pain in right hip: Secondary | ICD-10-CM | POA: Diagnosis not present

## 2017-02-16 DIAGNOSIS — R41841 Cognitive communication deficit: Secondary | ICD-10-CM | POA: Diagnosis not present

## 2017-02-16 DIAGNOSIS — F341 Dysthymic disorder: Secondary | ICD-10-CM | POA: Diagnosis not present

## 2017-02-17 DIAGNOSIS — R29818 Other symptoms and signs involving the nervous system: Secondary | ICD-10-CM | POA: Diagnosis not present

## 2017-02-17 DIAGNOSIS — M24562 Contracture, left knee: Secondary | ICD-10-CM | POA: Diagnosis not present

## 2017-02-17 DIAGNOSIS — R41841 Cognitive communication deficit: Secondary | ICD-10-CM | POA: Diagnosis not present

## 2017-02-17 DIAGNOSIS — G839 Paralytic syndrome, unspecified: Secondary | ICD-10-CM | POA: Diagnosis not present

## 2017-02-17 DIAGNOSIS — M24561 Contracture, right knee: Secondary | ICD-10-CM | POA: Diagnosis not present

## 2017-02-17 DIAGNOSIS — R1311 Dysphagia, oral phase: Secondary | ICD-10-CM | POA: Diagnosis not present

## 2017-02-19 DIAGNOSIS — R41841 Cognitive communication deficit: Secondary | ICD-10-CM | POA: Diagnosis not present

## 2017-02-19 DIAGNOSIS — M24561 Contracture, right knee: Secondary | ICD-10-CM | POA: Diagnosis not present

## 2017-02-19 DIAGNOSIS — R29818 Other symptoms and signs involving the nervous system: Secondary | ICD-10-CM | POA: Diagnosis not present

## 2017-02-19 DIAGNOSIS — R1311 Dysphagia, oral phase: Secondary | ICD-10-CM | POA: Diagnosis not present

## 2017-02-19 DIAGNOSIS — M24562 Contracture, left knee: Secondary | ICD-10-CM | POA: Diagnosis not present

## 2017-02-19 DIAGNOSIS — G839 Paralytic syndrome, unspecified: Secondary | ICD-10-CM | POA: Diagnosis not present

## 2017-02-20 DIAGNOSIS — G839 Paralytic syndrome, unspecified: Secondary | ICD-10-CM | POA: Diagnosis not present

## 2017-02-20 DIAGNOSIS — M24562 Contracture, left knee: Secondary | ICD-10-CM | POA: Diagnosis not present

## 2017-02-20 DIAGNOSIS — M24561 Contracture, right knee: Secondary | ICD-10-CM | POA: Diagnosis not present

## 2017-02-20 DIAGNOSIS — R41841 Cognitive communication deficit: Secondary | ICD-10-CM | POA: Diagnosis not present

## 2017-02-20 DIAGNOSIS — R1311 Dysphagia, oral phase: Secondary | ICD-10-CM | POA: Diagnosis not present

## 2017-02-20 DIAGNOSIS — R29818 Other symptoms and signs involving the nervous system: Secondary | ICD-10-CM | POA: Diagnosis not present

## 2017-02-21 DIAGNOSIS — F331 Major depressive disorder, recurrent, moderate: Secondary | ICD-10-CM | POA: Diagnosis not present

## 2017-02-21 DIAGNOSIS — R531 Weakness: Secondary | ICD-10-CM | POA: Diagnosis not present

## 2017-02-21 DIAGNOSIS — F419 Anxiety disorder, unspecified: Secondary | ICD-10-CM | POA: Diagnosis not present

## 2017-02-22 DIAGNOSIS — R1311 Dysphagia, oral phase: Secondary | ICD-10-CM | POA: Diagnosis not present

## 2017-02-22 DIAGNOSIS — G839 Paralytic syndrome, unspecified: Secondary | ICD-10-CM | POA: Diagnosis not present

## 2017-02-22 DIAGNOSIS — M24561 Contracture, right knee: Secondary | ICD-10-CM | POA: Diagnosis not present

## 2017-02-22 DIAGNOSIS — R41841 Cognitive communication deficit: Secondary | ICD-10-CM | POA: Diagnosis not present

## 2017-02-22 DIAGNOSIS — R29818 Other symptoms and signs involving the nervous system: Secondary | ICD-10-CM | POA: Diagnosis not present

## 2017-02-22 DIAGNOSIS — M24562 Contracture, left knee: Secondary | ICD-10-CM | POA: Diagnosis not present

## 2017-02-23 DIAGNOSIS — G839 Paralytic syndrome, unspecified: Secondary | ICD-10-CM | POA: Diagnosis not present

## 2017-02-23 DIAGNOSIS — R41841 Cognitive communication deficit: Secondary | ICD-10-CM | POA: Diagnosis not present

## 2017-02-23 DIAGNOSIS — M24561 Contracture, right knee: Secondary | ICD-10-CM | POA: Diagnosis not present

## 2017-02-23 DIAGNOSIS — R29818 Other symptoms and signs involving the nervous system: Secondary | ICD-10-CM | POA: Diagnosis not present

## 2017-02-23 DIAGNOSIS — M24562 Contracture, left knee: Secondary | ICD-10-CM | POA: Diagnosis not present

## 2017-02-23 DIAGNOSIS — R1311 Dysphagia, oral phase: Secondary | ICD-10-CM | POA: Diagnosis not present

## 2017-02-26 DIAGNOSIS — M24562 Contracture, left knee: Secondary | ICD-10-CM | POA: Diagnosis not present

## 2017-02-26 DIAGNOSIS — R41841 Cognitive communication deficit: Secondary | ICD-10-CM | POA: Diagnosis not present

## 2017-02-26 DIAGNOSIS — G839 Paralytic syndrome, unspecified: Secondary | ICD-10-CM | POA: Diagnosis not present

## 2017-02-26 DIAGNOSIS — R1311 Dysphagia, oral phase: Secondary | ICD-10-CM | POA: Diagnosis not present

## 2017-02-26 DIAGNOSIS — M24561 Contracture, right knee: Secondary | ICD-10-CM | POA: Diagnosis not present

## 2017-02-26 DIAGNOSIS — R29818 Other symptoms and signs involving the nervous system: Secondary | ICD-10-CM | POA: Diagnosis not present

## 2017-02-28 DIAGNOSIS — M25261 Flail joint, right knee: Secondary | ICD-10-CM | POA: Diagnosis not present

## 2017-02-28 DIAGNOSIS — R41841 Cognitive communication deficit: Secondary | ICD-10-CM | POA: Diagnosis not present

## 2017-02-28 DIAGNOSIS — F331 Major depressive disorder, recurrent, moderate: Secondary | ICD-10-CM | POA: Diagnosis not present

## 2017-02-28 DIAGNOSIS — M25561 Pain in right knee: Secondary | ICD-10-CM | POA: Diagnosis not present

## 2017-02-28 DIAGNOSIS — R293 Abnormal posture: Secondary | ICD-10-CM | POA: Diagnosis not present

## 2017-02-28 DIAGNOSIS — R29818 Other symptoms and signs involving the nervous system: Secondary | ICD-10-CM | POA: Diagnosis not present

## 2017-02-28 DIAGNOSIS — G809 Cerebral palsy, unspecified: Secondary | ICD-10-CM | POA: Diagnosis not present

## 2017-02-28 DIAGNOSIS — F419 Anxiety disorder, unspecified: Secondary | ICD-10-CM | POA: Diagnosis not present

## 2017-02-28 DIAGNOSIS — F329 Major depressive disorder, single episode, unspecified: Secondary | ICD-10-CM | POA: Diagnosis not present

## 2017-02-28 DIAGNOSIS — R1311 Dysphagia, oral phase: Secondary | ICD-10-CM | POA: Diagnosis not present

## 2017-02-28 DIAGNOSIS — R278 Other lack of coordination: Secondary | ICD-10-CM | POA: Diagnosis not present

## 2017-03-01 DIAGNOSIS — M25261 Flail joint, right knee: Secondary | ICD-10-CM | POA: Diagnosis not present

## 2017-03-01 DIAGNOSIS — R293 Abnormal posture: Secondary | ICD-10-CM | POA: Diagnosis not present

## 2017-03-01 DIAGNOSIS — R41841 Cognitive communication deficit: Secondary | ICD-10-CM | POA: Diagnosis not present

## 2017-03-01 DIAGNOSIS — M25561 Pain in right knee: Secondary | ICD-10-CM | POA: Diagnosis not present

## 2017-03-01 DIAGNOSIS — R1311 Dysphagia, oral phase: Secondary | ICD-10-CM | POA: Diagnosis not present

## 2017-03-01 DIAGNOSIS — R531 Weakness: Secondary | ICD-10-CM | POA: Diagnosis not present

## 2017-03-01 DIAGNOSIS — R29818 Other symptoms and signs involving the nervous system: Secondary | ICD-10-CM | POA: Diagnosis not present

## 2017-03-02 DIAGNOSIS — M25561 Pain in right knee: Secondary | ICD-10-CM | POA: Diagnosis not present

## 2017-03-02 DIAGNOSIS — Z79899 Other long term (current) drug therapy: Secondary | ICD-10-CM | POA: Diagnosis not present

## 2017-03-02 DIAGNOSIS — D649 Anemia, unspecified: Secondary | ICD-10-CM | POA: Diagnosis not present

## 2017-03-02 DIAGNOSIS — E039 Hypothyroidism, unspecified: Secondary | ICD-10-CM | POA: Diagnosis not present

## 2017-03-02 DIAGNOSIS — R293 Abnormal posture: Secondary | ICD-10-CM | POA: Diagnosis not present

## 2017-03-02 DIAGNOSIS — I10 Essential (primary) hypertension: Secondary | ICD-10-CM | POA: Diagnosis not present

## 2017-03-02 DIAGNOSIS — E559 Vitamin D deficiency, unspecified: Secondary | ICD-10-CM | POA: Diagnosis not present

## 2017-03-02 DIAGNOSIS — R1311 Dysphagia, oral phase: Secondary | ICD-10-CM | POA: Diagnosis not present

## 2017-03-02 DIAGNOSIS — R29818 Other symptoms and signs involving the nervous system: Secondary | ICD-10-CM | POA: Diagnosis not present

## 2017-03-02 DIAGNOSIS — M25261 Flail joint, right knee: Secondary | ICD-10-CM | POA: Diagnosis not present

## 2017-03-02 DIAGNOSIS — E785 Hyperlipidemia, unspecified: Secondary | ICD-10-CM | POA: Diagnosis not present

## 2017-03-02 DIAGNOSIS — R41841 Cognitive communication deficit: Secondary | ICD-10-CM | POA: Diagnosis not present

## 2017-03-05 DIAGNOSIS — R293 Abnormal posture: Secondary | ICD-10-CM | POA: Diagnosis not present

## 2017-03-05 DIAGNOSIS — R1311 Dysphagia, oral phase: Secondary | ICD-10-CM | POA: Diagnosis not present

## 2017-03-05 DIAGNOSIS — M25561 Pain in right knee: Secondary | ICD-10-CM | POA: Diagnosis not present

## 2017-03-05 DIAGNOSIS — R41841 Cognitive communication deficit: Secondary | ICD-10-CM | POA: Diagnosis not present

## 2017-03-05 DIAGNOSIS — R29818 Other symptoms and signs involving the nervous system: Secondary | ICD-10-CM | POA: Diagnosis not present

## 2017-03-05 DIAGNOSIS — M25261 Flail joint, right knee: Secondary | ICD-10-CM | POA: Diagnosis not present

## 2017-03-06 DIAGNOSIS — R41841 Cognitive communication deficit: Secondary | ICD-10-CM | POA: Diagnosis not present

## 2017-03-06 DIAGNOSIS — M25561 Pain in right knee: Secondary | ICD-10-CM | POA: Diagnosis not present

## 2017-03-06 DIAGNOSIS — R29818 Other symptoms and signs involving the nervous system: Secondary | ICD-10-CM | POA: Diagnosis not present

## 2017-03-06 DIAGNOSIS — M25261 Flail joint, right knee: Secondary | ICD-10-CM | POA: Diagnosis not present

## 2017-03-06 DIAGNOSIS — R293 Abnormal posture: Secondary | ICD-10-CM | POA: Diagnosis not present

## 2017-03-06 DIAGNOSIS — R1311 Dysphagia, oral phase: Secondary | ICD-10-CM | POA: Diagnosis not present

## 2017-03-07 DIAGNOSIS — R29818 Other symptoms and signs involving the nervous system: Secondary | ICD-10-CM | POA: Diagnosis not present

## 2017-03-07 DIAGNOSIS — R293 Abnormal posture: Secondary | ICD-10-CM | POA: Diagnosis not present

## 2017-03-07 DIAGNOSIS — M25561 Pain in right knee: Secondary | ICD-10-CM | POA: Diagnosis not present

## 2017-03-07 DIAGNOSIS — F419 Anxiety disorder, unspecified: Secondary | ICD-10-CM | POA: Diagnosis not present

## 2017-03-07 DIAGNOSIS — F331 Major depressive disorder, recurrent, moderate: Secondary | ICD-10-CM | POA: Diagnosis not present

## 2017-03-07 DIAGNOSIS — R1311 Dysphagia, oral phase: Secondary | ICD-10-CM | POA: Diagnosis not present

## 2017-03-07 DIAGNOSIS — R41841 Cognitive communication deficit: Secondary | ICD-10-CM | POA: Diagnosis not present

## 2017-03-07 DIAGNOSIS — M25261 Flail joint, right knee: Secondary | ICD-10-CM | POA: Diagnosis not present

## 2017-03-08 DIAGNOSIS — R293 Abnormal posture: Secondary | ICD-10-CM | POA: Diagnosis not present

## 2017-03-08 DIAGNOSIS — M25261 Flail joint, right knee: Secondary | ICD-10-CM | POA: Diagnosis not present

## 2017-03-08 DIAGNOSIS — Z961 Presence of intraocular lens: Secondary | ICD-10-CM | POA: Diagnosis not present

## 2017-03-08 DIAGNOSIS — M25561 Pain in right knee: Secondary | ICD-10-CM | POA: Diagnosis not present

## 2017-03-08 DIAGNOSIS — R41841 Cognitive communication deficit: Secondary | ICD-10-CM | POA: Diagnosis not present

## 2017-03-08 DIAGNOSIS — G809 Cerebral palsy, unspecified: Secondary | ICD-10-CM | POA: Diagnosis not present

## 2017-03-08 DIAGNOSIS — R29818 Other symptoms and signs involving the nervous system: Secondary | ICD-10-CM | POA: Diagnosis not present

## 2017-03-08 DIAGNOSIS — R1311 Dysphagia, oral phase: Secondary | ICD-10-CM | POA: Diagnosis not present

## 2017-03-13 DIAGNOSIS — R29818 Other symptoms and signs involving the nervous system: Secondary | ICD-10-CM | POA: Diagnosis not present

## 2017-03-13 DIAGNOSIS — R41841 Cognitive communication deficit: Secondary | ICD-10-CM | POA: Diagnosis not present

## 2017-03-13 DIAGNOSIS — M25261 Flail joint, right knee: Secondary | ICD-10-CM | POA: Diagnosis not present

## 2017-03-13 DIAGNOSIS — R293 Abnormal posture: Secondary | ICD-10-CM | POA: Diagnosis not present

## 2017-03-13 DIAGNOSIS — M25561 Pain in right knee: Secondary | ICD-10-CM | POA: Diagnosis not present

## 2017-03-13 DIAGNOSIS — R1311 Dysphagia, oral phase: Secondary | ICD-10-CM | POA: Diagnosis not present

## 2017-03-14 DIAGNOSIS — F419 Anxiety disorder, unspecified: Secondary | ICD-10-CM | POA: Diagnosis not present

## 2017-03-14 DIAGNOSIS — M25561 Pain in right knee: Secondary | ICD-10-CM | POA: Diagnosis not present

## 2017-03-14 DIAGNOSIS — M25261 Flail joint, right knee: Secondary | ICD-10-CM | POA: Diagnosis not present

## 2017-03-14 DIAGNOSIS — F331 Major depressive disorder, recurrent, moderate: Secondary | ICD-10-CM | POA: Diagnosis not present

## 2017-03-14 DIAGNOSIS — R29818 Other symptoms and signs involving the nervous system: Secondary | ICD-10-CM | POA: Diagnosis not present

## 2017-03-14 DIAGNOSIS — R1311 Dysphagia, oral phase: Secondary | ICD-10-CM | POA: Diagnosis not present

## 2017-03-14 DIAGNOSIS — R293 Abnormal posture: Secondary | ICD-10-CM | POA: Diagnosis not present

## 2017-03-14 DIAGNOSIS — R41841 Cognitive communication deficit: Secondary | ICD-10-CM | POA: Diagnosis not present

## 2017-03-28 DIAGNOSIS — F331 Major depressive disorder, recurrent, moderate: Secondary | ICD-10-CM | POA: Diagnosis not present

## 2017-03-28 DIAGNOSIS — F419 Anxiety disorder, unspecified: Secondary | ICD-10-CM | POA: Diagnosis not present

## 2017-04-04 DIAGNOSIS — E039 Hypothyroidism, unspecified: Secondary | ICD-10-CM | POA: Diagnosis not present

## 2017-04-04 DIAGNOSIS — F331 Major depressive disorder, recurrent, moderate: Secondary | ICD-10-CM | POA: Diagnosis not present

## 2017-04-04 DIAGNOSIS — F419 Anxiety disorder, unspecified: Secondary | ICD-10-CM | POA: Diagnosis not present

## 2017-04-11 DIAGNOSIS — F419 Anxiety disorder, unspecified: Secondary | ICD-10-CM | POA: Diagnosis not present

## 2017-04-11 DIAGNOSIS — F331 Major depressive disorder, recurrent, moderate: Secondary | ICD-10-CM | POA: Diagnosis not present

## 2017-04-18 DIAGNOSIS — F331 Major depressive disorder, recurrent, moderate: Secondary | ICD-10-CM | POA: Diagnosis not present

## 2017-04-18 DIAGNOSIS — F419 Anxiety disorder, unspecified: Secondary | ICD-10-CM | POA: Diagnosis not present

## 2017-04-23 DIAGNOSIS — R531 Weakness: Secondary | ICD-10-CM | POA: Diagnosis not present

## 2017-04-25 DIAGNOSIS — F419 Anxiety disorder, unspecified: Secondary | ICD-10-CM | POA: Diagnosis not present

## 2017-04-25 DIAGNOSIS — G809 Cerebral palsy, unspecified: Secondary | ICD-10-CM | POA: Diagnosis not present

## 2017-04-25 DIAGNOSIS — F329 Major depressive disorder, single episode, unspecified: Secondary | ICD-10-CM | POA: Diagnosis not present

## 2017-04-25 DIAGNOSIS — F331 Major depressive disorder, recurrent, moderate: Secondary | ICD-10-CM | POA: Diagnosis not present

## 2017-04-25 DIAGNOSIS — Z66 Do not resuscitate: Secondary | ICD-10-CM | POA: Diagnosis not present

## 2017-05-02 DIAGNOSIS — F331 Major depressive disorder, recurrent, moderate: Secondary | ICD-10-CM | POA: Diagnosis not present

## 2017-05-02 DIAGNOSIS — F419 Anxiety disorder, unspecified: Secondary | ICD-10-CM | POA: Diagnosis not present

## 2017-05-09 DIAGNOSIS — F419 Anxiety disorder, unspecified: Secondary | ICD-10-CM | POA: Diagnosis not present

## 2017-05-09 DIAGNOSIS — F331 Major depressive disorder, recurrent, moderate: Secondary | ICD-10-CM | POA: Diagnosis not present

## 2017-05-14 DIAGNOSIS — R531 Weakness: Secondary | ICD-10-CM | POA: Diagnosis not present

## 2017-05-16 DIAGNOSIS — F419 Anxiety disorder, unspecified: Secondary | ICD-10-CM | POA: Diagnosis not present

## 2017-05-16 DIAGNOSIS — F331 Major depressive disorder, recurrent, moderate: Secondary | ICD-10-CM | POA: Diagnosis not present

## 2017-05-18 DIAGNOSIS — F419 Anxiety disorder, unspecified: Secondary | ICD-10-CM | POA: Diagnosis not present

## 2017-05-18 DIAGNOSIS — F331 Major depressive disorder, recurrent, moderate: Secondary | ICD-10-CM | POA: Diagnosis not present

## 2017-05-23 DIAGNOSIS — F331 Major depressive disorder, recurrent, moderate: Secondary | ICD-10-CM | POA: Diagnosis not present

## 2017-05-23 DIAGNOSIS — F419 Anxiety disorder, unspecified: Secondary | ICD-10-CM | POA: Diagnosis not present

## 2017-05-25 DIAGNOSIS — B351 Tinea unguium: Secondary | ICD-10-CM | POA: Diagnosis not present

## 2017-05-25 DIAGNOSIS — Z7984 Long term (current) use of oral hypoglycemic drugs: Secondary | ICD-10-CM | POA: Diagnosis not present

## 2017-05-25 DIAGNOSIS — E1140 Type 2 diabetes mellitus with diabetic neuropathy, unspecified: Secondary | ICD-10-CM | POA: Diagnosis not present

## 2017-05-30 DIAGNOSIS — F419 Anxiety disorder, unspecified: Secondary | ICD-10-CM | POA: Diagnosis not present

## 2017-05-30 DIAGNOSIS — F331 Major depressive disorder, recurrent, moderate: Secondary | ICD-10-CM | POA: Diagnosis not present

## 2017-06-06 DIAGNOSIS — F419 Anxiety disorder, unspecified: Secondary | ICD-10-CM | POA: Diagnosis not present

## 2017-06-06 DIAGNOSIS — F331 Major depressive disorder, recurrent, moderate: Secondary | ICD-10-CM | POA: Diagnosis not present

## 2017-06-20 DIAGNOSIS — G809 Cerebral palsy, unspecified: Secondary | ICD-10-CM | POA: Diagnosis not present

## 2017-06-20 DIAGNOSIS — Z66 Do not resuscitate: Secondary | ICD-10-CM | POA: Diagnosis not present

## 2017-06-20 DIAGNOSIS — F3341 Major depressive disorder, recurrent, in partial remission: Secondary | ICD-10-CM | POA: Diagnosis not present

## 2017-06-27 DIAGNOSIS — F411 Generalized anxiety disorder: Secondary | ICD-10-CM | POA: Diagnosis not present

## 2017-06-27 DIAGNOSIS — F331 Major depressive disorder, recurrent, moderate: Secondary | ICD-10-CM | POA: Diagnosis not present

## 2017-06-28 DIAGNOSIS — F331 Major depressive disorder, recurrent, moderate: Secondary | ICD-10-CM | POA: Diagnosis not present

## 2017-06-28 DIAGNOSIS — F411 Generalized anxiety disorder: Secondary | ICD-10-CM | POA: Diagnosis not present

## 2017-07-16 DIAGNOSIS — R531 Weakness: Secondary | ICD-10-CM | POA: Diagnosis not present

## 2017-07-18 DIAGNOSIS — M6281 Muscle weakness (generalized): Secondary | ICD-10-CM | POA: Diagnosis not present

## 2017-07-18 DIAGNOSIS — M25561 Pain in right knee: Secondary | ICD-10-CM | POA: Diagnosis not present

## 2017-07-18 DIAGNOSIS — R41841 Cognitive communication deficit: Secondary | ICD-10-CM | POA: Diagnosis not present

## 2017-07-18 DIAGNOSIS — R29818 Other symptoms and signs involving the nervous system: Secondary | ICD-10-CM | POA: Diagnosis not present

## 2017-07-18 DIAGNOSIS — R1311 Dysphagia, oral phase: Secondary | ICD-10-CM | POA: Diagnosis not present

## 2017-07-18 DIAGNOSIS — R293 Abnormal posture: Secondary | ICD-10-CM | POA: Diagnosis not present

## 2017-07-18 DIAGNOSIS — R278 Other lack of coordination: Secondary | ICD-10-CM | POA: Diagnosis not present

## 2017-07-19 DIAGNOSIS — R293 Abnormal posture: Secondary | ICD-10-CM | POA: Diagnosis not present

## 2017-07-19 DIAGNOSIS — R29818 Other symptoms and signs involving the nervous system: Secondary | ICD-10-CM | POA: Diagnosis not present

## 2017-07-19 DIAGNOSIS — M6281 Muscle weakness (generalized): Secondary | ICD-10-CM | POA: Diagnosis not present

## 2017-07-19 DIAGNOSIS — R41841 Cognitive communication deficit: Secondary | ICD-10-CM | POA: Diagnosis not present

## 2017-07-19 DIAGNOSIS — R1311 Dysphagia, oral phase: Secondary | ICD-10-CM | POA: Diagnosis not present

## 2017-07-19 DIAGNOSIS — M25561 Pain in right knee: Secondary | ICD-10-CM | POA: Diagnosis not present

## 2017-07-20 DIAGNOSIS — R29818 Other symptoms and signs involving the nervous system: Secondary | ICD-10-CM | POA: Diagnosis not present

## 2017-07-20 DIAGNOSIS — M25561 Pain in right knee: Secondary | ICD-10-CM | POA: Diagnosis not present

## 2017-07-20 DIAGNOSIS — R41841 Cognitive communication deficit: Secondary | ICD-10-CM | POA: Diagnosis not present

## 2017-07-20 DIAGNOSIS — R293 Abnormal posture: Secondary | ICD-10-CM | POA: Diagnosis not present

## 2017-07-20 DIAGNOSIS — R1311 Dysphagia, oral phase: Secondary | ICD-10-CM | POA: Diagnosis not present

## 2017-07-20 DIAGNOSIS — M6281 Muscle weakness (generalized): Secondary | ICD-10-CM | POA: Diagnosis not present

## 2017-07-22 DIAGNOSIS — M25561 Pain in right knee: Secondary | ICD-10-CM | POA: Diagnosis not present

## 2017-07-22 DIAGNOSIS — R293 Abnormal posture: Secondary | ICD-10-CM | POA: Diagnosis not present

## 2017-07-22 DIAGNOSIS — M6281 Muscle weakness (generalized): Secondary | ICD-10-CM | POA: Diagnosis not present

## 2017-07-22 DIAGNOSIS — R1311 Dysphagia, oral phase: Secondary | ICD-10-CM | POA: Diagnosis not present

## 2017-07-22 DIAGNOSIS — R41841 Cognitive communication deficit: Secondary | ICD-10-CM | POA: Diagnosis not present

## 2017-07-22 DIAGNOSIS — R29818 Other symptoms and signs involving the nervous system: Secondary | ICD-10-CM | POA: Diagnosis not present

## 2017-07-23 DIAGNOSIS — M25561 Pain in right knee: Secondary | ICD-10-CM | POA: Diagnosis not present

## 2017-07-23 DIAGNOSIS — R41841 Cognitive communication deficit: Secondary | ICD-10-CM | POA: Diagnosis not present

## 2017-07-23 DIAGNOSIS — R293 Abnormal posture: Secondary | ICD-10-CM | POA: Diagnosis not present

## 2017-07-23 DIAGNOSIS — M6281 Muscle weakness (generalized): Secondary | ICD-10-CM | POA: Diagnosis not present

## 2017-07-23 DIAGNOSIS — R29818 Other symptoms and signs involving the nervous system: Secondary | ICD-10-CM | POA: Diagnosis not present

## 2017-07-23 DIAGNOSIS — R1311 Dysphagia, oral phase: Secondary | ICD-10-CM | POA: Diagnosis not present

## 2017-07-25 DIAGNOSIS — R41841 Cognitive communication deficit: Secondary | ICD-10-CM | POA: Diagnosis not present

## 2017-07-25 DIAGNOSIS — R293 Abnormal posture: Secondary | ICD-10-CM | POA: Diagnosis not present

## 2017-07-25 DIAGNOSIS — M6281 Muscle weakness (generalized): Secondary | ICD-10-CM | POA: Diagnosis not present

## 2017-07-25 DIAGNOSIS — M25561 Pain in right knee: Secondary | ICD-10-CM | POA: Diagnosis not present

## 2017-07-25 DIAGNOSIS — R1311 Dysphagia, oral phase: Secondary | ICD-10-CM | POA: Diagnosis not present

## 2017-07-25 DIAGNOSIS — R29818 Other symptoms and signs involving the nervous system: Secondary | ICD-10-CM | POA: Diagnosis not present

## 2017-07-26 DIAGNOSIS — M25561 Pain in right knee: Secondary | ICD-10-CM | POA: Diagnosis not present

## 2017-07-26 DIAGNOSIS — R293 Abnormal posture: Secondary | ICD-10-CM | POA: Diagnosis not present

## 2017-07-26 DIAGNOSIS — R41841 Cognitive communication deficit: Secondary | ICD-10-CM | POA: Diagnosis not present

## 2017-07-26 DIAGNOSIS — R1311 Dysphagia, oral phase: Secondary | ICD-10-CM | POA: Diagnosis not present

## 2017-07-26 DIAGNOSIS — M6281 Muscle weakness (generalized): Secondary | ICD-10-CM | POA: Diagnosis not present

## 2017-07-26 DIAGNOSIS — R29818 Other symptoms and signs involving the nervous system: Secondary | ICD-10-CM | POA: Diagnosis not present

## 2017-07-27 DIAGNOSIS — K219 Gastro-esophageal reflux disease without esophagitis: Secondary | ICD-10-CM | POA: Diagnosis not present

## 2017-07-27 DIAGNOSIS — R1311 Dysphagia, oral phase: Secondary | ICD-10-CM | POA: Diagnosis not present

## 2017-07-27 DIAGNOSIS — M6281 Muscle weakness (generalized): Secondary | ICD-10-CM | POA: Diagnosis not present

## 2017-07-27 DIAGNOSIS — F341 Dysthymic disorder: Secondary | ICD-10-CM | POA: Diagnosis not present

## 2017-07-27 DIAGNOSIS — R29818 Other symptoms and signs involving the nervous system: Secondary | ICD-10-CM | POA: Diagnosis not present

## 2017-07-27 DIAGNOSIS — R293 Abnormal posture: Secondary | ICD-10-CM | POA: Diagnosis not present

## 2017-07-27 DIAGNOSIS — R1110 Vomiting, unspecified: Secondary | ICD-10-CM | POA: Diagnosis not present

## 2017-07-27 DIAGNOSIS — R41841 Cognitive communication deficit: Secondary | ICD-10-CM | POA: Diagnosis not present

## 2017-07-27 DIAGNOSIS — M25561 Pain in right knee: Secondary | ICD-10-CM | POA: Diagnosis not present

## 2017-07-27 DIAGNOSIS — G809 Cerebral palsy, unspecified: Secondary | ICD-10-CM | POA: Diagnosis not present

## 2017-07-30 DIAGNOSIS — R41841 Cognitive communication deficit: Secondary | ICD-10-CM | POA: Diagnosis not present

## 2017-07-30 DIAGNOSIS — M6281 Muscle weakness (generalized): Secondary | ICD-10-CM | POA: Diagnosis not present

## 2017-07-30 DIAGNOSIS — M25561 Pain in right knee: Secondary | ICD-10-CM | POA: Diagnosis not present

## 2017-07-30 DIAGNOSIS — R29818 Other symptoms and signs involving the nervous system: Secondary | ICD-10-CM | POA: Diagnosis not present

## 2017-07-30 DIAGNOSIS — R1311 Dysphagia, oral phase: Secondary | ICD-10-CM | POA: Diagnosis not present

## 2017-07-30 DIAGNOSIS — R293 Abnormal posture: Secondary | ICD-10-CM | POA: Diagnosis not present

## 2017-07-31 DIAGNOSIS — G809 Cerebral palsy, unspecified: Secondary | ICD-10-CM | POA: Diagnosis not present

## 2017-07-31 DIAGNOSIS — R278 Other lack of coordination: Secondary | ICD-10-CM | POA: Diagnosis not present

## 2017-07-31 DIAGNOSIS — M25561 Pain in right knee: Secondary | ICD-10-CM | POA: Diagnosis not present

## 2017-07-31 DIAGNOSIS — R1311 Dysphagia, oral phase: Secondary | ICD-10-CM | POA: Diagnosis not present

## 2017-07-31 DIAGNOSIS — M6281 Muscle weakness (generalized): Secondary | ICD-10-CM | POA: Diagnosis not present

## 2017-07-31 DIAGNOSIS — R29818 Other symptoms and signs involving the nervous system: Secondary | ICD-10-CM | POA: Diagnosis not present

## 2017-07-31 DIAGNOSIS — R41841 Cognitive communication deficit: Secondary | ICD-10-CM | POA: Diagnosis not present

## 2017-07-31 DIAGNOSIS — R293 Abnormal posture: Secondary | ICD-10-CM | POA: Diagnosis not present

## 2017-08-15 DIAGNOSIS — R1311 Dysphagia, oral phase: Secondary | ICD-10-CM | POA: Diagnosis not present

## 2017-08-15 DIAGNOSIS — M25561 Pain in right knee: Secondary | ICD-10-CM | POA: Diagnosis not present

## 2017-08-15 DIAGNOSIS — R29818 Other symptoms and signs involving the nervous system: Secondary | ICD-10-CM | POA: Diagnosis not present

## 2017-08-15 DIAGNOSIS — R41841 Cognitive communication deficit: Secondary | ICD-10-CM | POA: Diagnosis not present

## 2017-08-15 DIAGNOSIS — G809 Cerebral palsy, unspecified: Secondary | ICD-10-CM | POA: Diagnosis not present

## 2017-08-15 DIAGNOSIS — R293 Abnormal posture: Secondary | ICD-10-CM | POA: Diagnosis not present

## 2017-08-15 DIAGNOSIS — F339 Major depressive disorder, recurrent, unspecified: Secondary | ICD-10-CM | POA: Diagnosis not present

## 2017-08-15 DIAGNOSIS — Z66 Do not resuscitate: Secondary | ICD-10-CM | POA: Diagnosis not present

## 2017-08-17 DIAGNOSIS — R293 Abnormal posture: Secondary | ICD-10-CM | POA: Diagnosis not present

## 2017-08-17 DIAGNOSIS — G809 Cerebral palsy, unspecified: Secondary | ICD-10-CM | POA: Diagnosis not present

## 2017-08-17 DIAGNOSIS — R29818 Other symptoms and signs involving the nervous system: Secondary | ICD-10-CM | POA: Diagnosis not present

## 2017-08-17 DIAGNOSIS — M25561 Pain in right knee: Secondary | ICD-10-CM | POA: Diagnosis not present

## 2017-08-17 DIAGNOSIS — R41841 Cognitive communication deficit: Secondary | ICD-10-CM | POA: Diagnosis not present

## 2017-08-17 DIAGNOSIS — R1311 Dysphagia, oral phase: Secondary | ICD-10-CM | POA: Diagnosis not present

## 2017-08-20 DIAGNOSIS — R1311 Dysphagia, oral phase: Secondary | ICD-10-CM | POA: Diagnosis not present

## 2017-08-20 DIAGNOSIS — R29818 Other symptoms and signs involving the nervous system: Secondary | ICD-10-CM | POA: Diagnosis not present

## 2017-08-20 DIAGNOSIS — G809 Cerebral palsy, unspecified: Secondary | ICD-10-CM | POA: Diagnosis not present

## 2017-08-20 DIAGNOSIS — R41841 Cognitive communication deficit: Secondary | ICD-10-CM | POA: Diagnosis not present

## 2017-08-20 DIAGNOSIS — M25561 Pain in right knee: Secondary | ICD-10-CM | POA: Diagnosis not present

## 2017-08-20 DIAGNOSIS — R293 Abnormal posture: Secondary | ICD-10-CM | POA: Diagnosis not present

## 2017-08-21 DIAGNOSIS — G809 Cerebral palsy, unspecified: Secondary | ICD-10-CM | POA: Diagnosis not present

## 2017-08-21 DIAGNOSIS — R1311 Dysphagia, oral phase: Secondary | ICD-10-CM | POA: Diagnosis not present

## 2017-08-21 DIAGNOSIS — R29818 Other symptoms and signs involving the nervous system: Secondary | ICD-10-CM | POA: Diagnosis not present

## 2017-08-21 DIAGNOSIS — R293 Abnormal posture: Secondary | ICD-10-CM | POA: Diagnosis not present

## 2017-08-21 DIAGNOSIS — M25561 Pain in right knee: Secondary | ICD-10-CM | POA: Diagnosis not present

## 2017-08-21 DIAGNOSIS — R41841 Cognitive communication deficit: Secondary | ICD-10-CM | POA: Diagnosis not present

## 2017-08-22 DIAGNOSIS — R29818 Other symptoms and signs involving the nervous system: Secondary | ICD-10-CM | POA: Diagnosis not present

## 2017-08-22 DIAGNOSIS — G809 Cerebral palsy, unspecified: Secondary | ICD-10-CM | POA: Diagnosis not present

## 2017-08-22 DIAGNOSIS — M25561 Pain in right knee: Secondary | ICD-10-CM | POA: Diagnosis not present

## 2017-08-22 DIAGNOSIS — R293 Abnormal posture: Secondary | ICD-10-CM | POA: Diagnosis not present

## 2017-08-22 DIAGNOSIS — R41841 Cognitive communication deficit: Secondary | ICD-10-CM | POA: Diagnosis not present

## 2017-08-22 DIAGNOSIS — R1311 Dysphagia, oral phase: Secondary | ICD-10-CM | POA: Diagnosis not present

## 2017-08-23 DIAGNOSIS — G809 Cerebral palsy, unspecified: Secondary | ICD-10-CM | POA: Diagnosis not present

## 2017-08-23 DIAGNOSIS — R1311 Dysphagia, oral phase: Secondary | ICD-10-CM | POA: Diagnosis not present

## 2017-08-23 DIAGNOSIS — R41841 Cognitive communication deficit: Secondary | ICD-10-CM | POA: Diagnosis not present

## 2017-08-23 DIAGNOSIS — M25561 Pain in right knee: Secondary | ICD-10-CM | POA: Diagnosis not present

## 2017-08-23 DIAGNOSIS — R293 Abnormal posture: Secondary | ICD-10-CM | POA: Diagnosis not present

## 2017-08-23 DIAGNOSIS — R29818 Other symptoms and signs involving the nervous system: Secondary | ICD-10-CM | POA: Diagnosis not present

## 2017-08-24 DIAGNOSIS — R41841 Cognitive communication deficit: Secondary | ICD-10-CM | POA: Diagnosis not present

## 2017-08-24 DIAGNOSIS — I739 Peripheral vascular disease, unspecified: Secondary | ICD-10-CM | POA: Diagnosis not present

## 2017-08-24 DIAGNOSIS — R29818 Other symptoms and signs involving the nervous system: Secondary | ICD-10-CM | POA: Diagnosis not present

## 2017-08-24 DIAGNOSIS — R262 Difficulty in walking, not elsewhere classified: Secondary | ICD-10-CM | POA: Diagnosis not present

## 2017-08-24 DIAGNOSIS — R1311 Dysphagia, oral phase: Secondary | ICD-10-CM | POA: Diagnosis not present

## 2017-08-24 DIAGNOSIS — B351 Tinea unguium: Secondary | ICD-10-CM | POA: Diagnosis not present

## 2017-08-24 DIAGNOSIS — R293 Abnormal posture: Secondary | ICD-10-CM | POA: Diagnosis not present

## 2017-08-24 DIAGNOSIS — M25561 Pain in right knee: Secondary | ICD-10-CM | POA: Diagnosis not present

## 2017-08-24 DIAGNOSIS — G809 Cerebral palsy, unspecified: Secondary | ICD-10-CM | POA: Diagnosis not present

## 2017-08-26 DIAGNOSIS — R41841 Cognitive communication deficit: Secondary | ICD-10-CM | POA: Diagnosis not present

## 2017-08-26 DIAGNOSIS — R293 Abnormal posture: Secondary | ICD-10-CM | POA: Diagnosis not present

## 2017-08-26 DIAGNOSIS — M25561 Pain in right knee: Secondary | ICD-10-CM | POA: Diagnosis not present

## 2017-08-26 DIAGNOSIS — G809 Cerebral palsy, unspecified: Secondary | ICD-10-CM | POA: Diagnosis not present

## 2017-08-26 DIAGNOSIS — R29818 Other symptoms and signs involving the nervous system: Secondary | ICD-10-CM | POA: Diagnosis not present

## 2017-08-26 DIAGNOSIS — R1311 Dysphagia, oral phase: Secondary | ICD-10-CM | POA: Diagnosis not present

## 2017-08-27 DIAGNOSIS — G809 Cerebral palsy, unspecified: Secondary | ICD-10-CM | POA: Diagnosis not present

## 2017-08-27 DIAGNOSIS — R1311 Dysphagia, oral phase: Secondary | ICD-10-CM | POA: Diagnosis not present

## 2017-08-27 DIAGNOSIS — M25561 Pain in right knee: Secondary | ICD-10-CM | POA: Diagnosis not present

## 2017-08-27 DIAGNOSIS — R293 Abnormal posture: Secondary | ICD-10-CM | POA: Diagnosis not present

## 2017-08-27 DIAGNOSIS — R29818 Other symptoms and signs involving the nervous system: Secondary | ICD-10-CM | POA: Diagnosis not present

## 2017-08-27 DIAGNOSIS — R41841 Cognitive communication deficit: Secondary | ICD-10-CM | POA: Diagnosis not present

## 2017-08-28 DIAGNOSIS — R29818 Other symptoms and signs involving the nervous system: Secondary | ICD-10-CM | POA: Diagnosis not present

## 2017-08-28 DIAGNOSIS — G809 Cerebral palsy, unspecified: Secondary | ICD-10-CM | POA: Diagnosis not present

## 2017-08-28 DIAGNOSIS — M25561 Pain in right knee: Secondary | ICD-10-CM | POA: Diagnosis not present

## 2017-08-28 DIAGNOSIS — R41841 Cognitive communication deficit: Secondary | ICD-10-CM | POA: Diagnosis not present

## 2017-08-28 DIAGNOSIS — R1311 Dysphagia, oral phase: Secondary | ICD-10-CM | POA: Diagnosis not present

## 2017-08-28 DIAGNOSIS — R293 Abnormal posture: Secondary | ICD-10-CM | POA: Diagnosis not present

## 2017-08-29 DIAGNOSIS — R1311 Dysphagia, oral phase: Secondary | ICD-10-CM | POA: Diagnosis not present

## 2017-08-29 DIAGNOSIS — M25561 Pain in right knee: Secondary | ICD-10-CM | POA: Diagnosis not present

## 2017-08-29 DIAGNOSIS — R293 Abnormal posture: Secondary | ICD-10-CM | POA: Diagnosis not present

## 2017-08-29 DIAGNOSIS — R41841 Cognitive communication deficit: Secondary | ICD-10-CM | POA: Diagnosis not present

## 2017-08-29 DIAGNOSIS — R29818 Other symptoms and signs involving the nervous system: Secondary | ICD-10-CM | POA: Diagnosis not present

## 2017-08-29 DIAGNOSIS — G809 Cerebral palsy, unspecified: Secondary | ICD-10-CM | POA: Diagnosis not present

## 2017-08-31 DIAGNOSIS — R293 Abnormal posture: Secondary | ICD-10-CM | POA: Diagnosis not present

## 2017-08-31 DIAGNOSIS — R29818 Other symptoms and signs involving the nervous system: Secondary | ICD-10-CM | POA: Diagnosis not present

## 2017-08-31 DIAGNOSIS — R278 Other lack of coordination: Secondary | ICD-10-CM | POA: Diagnosis not present

## 2017-08-31 DIAGNOSIS — R1311 Dysphagia, oral phase: Secondary | ICD-10-CM | POA: Diagnosis not present

## 2017-08-31 DIAGNOSIS — R41841 Cognitive communication deficit: Secondary | ICD-10-CM | POA: Diagnosis not present

## 2017-08-31 DIAGNOSIS — G809 Cerebral palsy, unspecified: Secondary | ICD-10-CM | POA: Diagnosis not present

## 2017-08-31 DIAGNOSIS — M25561 Pain in right knee: Secondary | ICD-10-CM | POA: Diagnosis not present

## 2017-09-03 DIAGNOSIS — R29818 Other symptoms and signs involving the nervous system: Secondary | ICD-10-CM | POA: Diagnosis not present

## 2017-09-03 DIAGNOSIS — R293 Abnormal posture: Secondary | ICD-10-CM | POA: Diagnosis not present

## 2017-09-03 DIAGNOSIS — R41841 Cognitive communication deficit: Secondary | ICD-10-CM | POA: Diagnosis not present

## 2017-09-03 DIAGNOSIS — R1311 Dysphagia, oral phase: Secondary | ICD-10-CM | POA: Diagnosis not present

## 2017-09-03 DIAGNOSIS — M25561 Pain in right knee: Secondary | ICD-10-CM | POA: Diagnosis not present

## 2017-09-03 DIAGNOSIS — G809 Cerebral palsy, unspecified: Secondary | ICD-10-CM | POA: Diagnosis not present

## 2017-09-04 DIAGNOSIS — R531 Weakness: Secondary | ICD-10-CM | POA: Diagnosis not present

## 2017-09-12 DIAGNOSIS — F331 Major depressive disorder, recurrent, moderate: Secondary | ICD-10-CM | POA: Diagnosis not present

## 2017-09-12 DIAGNOSIS — F411 Generalized anxiety disorder: Secondary | ICD-10-CM | POA: Diagnosis not present

## 2017-09-25 DIAGNOSIS — F331 Major depressive disorder, recurrent, moderate: Secondary | ICD-10-CM | POA: Diagnosis not present

## 2017-09-25 DIAGNOSIS — F411 Generalized anxiety disorder: Secondary | ICD-10-CM | POA: Diagnosis not present

## 2017-10-10 DIAGNOSIS — Z66 Do not resuscitate: Secondary | ICD-10-CM | POA: Diagnosis not present

## 2017-10-10 DIAGNOSIS — F329 Major depressive disorder, single episode, unspecified: Secondary | ICD-10-CM | POA: Diagnosis not present

## 2017-10-10 DIAGNOSIS — S31000A Unspecified open wound of lower back and pelvis without penetration into retroperitoneum, initial encounter: Secondary | ICD-10-CM | POA: Diagnosis not present

## 2017-10-10 DIAGNOSIS — G809 Cerebral palsy, unspecified: Secondary | ICD-10-CM | POA: Diagnosis not present

## 2017-10-17 DIAGNOSIS — S31000D Unspecified open wound of lower back and pelvis without penetration into retroperitoneum, subsequent encounter: Secondary | ICD-10-CM | POA: Diagnosis not present

## 2017-10-24 DIAGNOSIS — F331 Major depressive disorder, recurrent, moderate: Secondary | ICD-10-CM | POA: Diagnosis not present

## 2017-10-24 DIAGNOSIS — F411 Generalized anxiety disorder: Secondary | ICD-10-CM | POA: Diagnosis not present

## 2017-10-25 DIAGNOSIS — F411 Generalized anxiety disorder: Secondary | ICD-10-CM | POA: Diagnosis not present

## 2017-10-25 DIAGNOSIS — F331 Major depressive disorder, recurrent, moderate: Secondary | ICD-10-CM | POA: Diagnosis not present

## 2017-10-31 DIAGNOSIS — S31000D Unspecified open wound of lower back and pelvis without penetration into retroperitoneum, subsequent encounter: Secondary | ICD-10-CM | POA: Diagnosis not present

## 2017-11-09 DIAGNOSIS — I10 Essential (primary) hypertension: Secondary | ICD-10-CM | POA: Diagnosis not present

## 2017-11-09 DIAGNOSIS — D649 Anemia, unspecified: Secondary | ICD-10-CM | POA: Diagnosis not present

## 2017-11-09 DIAGNOSIS — E785 Hyperlipidemia, unspecified: Secondary | ICD-10-CM | POA: Diagnosis not present

## 2017-11-09 DIAGNOSIS — E039 Hypothyroidism, unspecified: Secondary | ICD-10-CM | POA: Diagnosis not present

## 2017-11-09 DIAGNOSIS — E559 Vitamin D deficiency, unspecified: Secondary | ICD-10-CM | POA: Diagnosis not present

## 2017-11-14 DIAGNOSIS — S31000D Unspecified open wound of lower back and pelvis without penetration into retroperitoneum, subsequent encounter: Secondary | ICD-10-CM | POA: Diagnosis not present

## 2017-11-19 DIAGNOSIS — R0989 Other specified symptoms and signs involving the circulatory and respiratory systems: Secondary | ICD-10-CM | POA: Diagnosis not present

## 2017-11-19 DIAGNOSIS — D649 Anemia, unspecified: Secondary | ICD-10-CM | POA: Diagnosis not present

## 2017-11-19 DIAGNOSIS — N390 Urinary tract infection, site not specified: Secondary | ICD-10-CM | POA: Diagnosis not present

## 2017-11-19 DIAGNOSIS — E876 Hypokalemia: Secondary | ICD-10-CM | POA: Diagnosis not present

## 2017-11-19 DIAGNOSIS — K5900 Constipation, unspecified: Secondary | ICD-10-CM | POA: Diagnosis not present

## 2017-11-19 DIAGNOSIS — R319 Hematuria, unspecified: Secondary | ICD-10-CM | POA: Diagnosis not present

## 2017-11-19 DIAGNOSIS — Z79899 Other long term (current) drug therapy: Secondary | ICD-10-CM | POA: Diagnosis not present

## 2017-11-19 DIAGNOSIS — R112 Nausea with vomiting, unspecified: Secondary | ICD-10-CM | POA: Diagnosis not present

## 2017-11-21 DIAGNOSIS — F411 Generalized anxiety disorder: Secondary | ICD-10-CM | POA: Diagnosis not present

## 2017-11-21 DIAGNOSIS — F331 Major depressive disorder, recurrent, moderate: Secondary | ICD-10-CM | POA: Diagnosis not present

## 2017-11-22 DIAGNOSIS — R062 Wheezing: Secondary | ICD-10-CM | POA: Diagnosis not present

## 2017-11-22 DIAGNOSIS — R0602 Shortness of breath: Secondary | ICD-10-CM | POA: Diagnosis not present

## 2017-11-23 DIAGNOSIS — R41841 Cognitive communication deficit: Secondary | ICD-10-CM | POA: Diagnosis not present

## 2017-11-23 DIAGNOSIS — R29818 Other symptoms and signs involving the nervous system: Secondary | ICD-10-CM | POA: Diagnosis not present

## 2017-11-23 DIAGNOSIS — R278 Other lack of coordination: Secondary | ICD-10-CM | POA: Diagnosis not present

## 2017-11-23 DIAGNOSIS — R1311 Dysphagia, oral phase: Secondary | ICD-10-CM | POA: Diagnosis not present

## 2017-11-23 DIAGNOSIS — R293 Abnormal posture: Secondary | ICD-10-CM | POA: Diagnosis not present

## 2017-11-23 DIAGNOSIS — M25561 Pain in right knee: Secondary | ICD-10-CM | POA: Diagnosis not present

## 2017-11-23 DIAGNOSIS — M6281 Muscle weakness (generalized): Secondary | ICD-10-CM | POA: Diagnosis not present

## 2017-11-23 DIAGNOSIS — G809 Cerebral palsy, unspecified: Secondary | ICD-10-CM | POA: Diagnosis not present

## 2017-11-23 DIAGNOSIS — R1312 Dysphagia, oropharyngeal phase: Secondary | ICD-10-CM | POA: Diagnosis not present

## 2017-11-26 DIAGNOSIS — R1311 Dysphagia, oral phase: Secondary | ICD-10-CM | POA: Diagnosis not present

## 2017-11-26 DIAGNOSIS — R29818 Other symptoms and signs involving the nervous system: Secondary | ICD-10-CM | POA: Diagnosis not present

## 2017-11-26 DIAGNOSIS — R41841 Cognitive communication deficit: Secondary | ICD-10-CM | POA: Diagnosis not present

## 2017-11-26 DIAGNOSIS — G809 Cerebral palsy, unspecified: Secondary | ICD-10-CM | POA: Diagnosis not present

## 2017-11-26 DIAGNOSIS — R293 Abnormal posture: Secondary | ICD-10-CM | POA: Diagnosis not present

## 2017-11-26 DIAGNOSIS — M25561 Pain in right knee: Secondary | ICD-10-CM | POA: Diagnosis not present

## 2017-11-27 DIAGNOSIS — M25561 Pain in right knee: Secondary | ICD-10-CM | POA: Diagnosis not present

## 2017-11-27 DIAGNOSIS — R41841 Cognitive communication deficit: Secondary | ICD-10-CM | POA: Diagnosis not present

## 2017-11-27 DIAGNOSIS — R29818 Other symptoms and signs involving the nervous system: Secondary | ICD-10-CM | POA: Diagnosis not present

## 2017-11-27 DIAGNOSIS — R293 Abnormal posture: Secondary | ICD-10-CM | POA: Diagnosis not present

## 2017-11-27 DIAGNOSIS — G809 Cerebral palsy, unspecified: Secondary | ICD-10-CM | POA: Diagnosis not present

## 2017-11-27 DIAGNOSIS — R1311 Dysphagia, oral phase: Secondary | ICD-10-CM | POA: Diagnosis not present

## 2017-11-28 DIAGNOSIS — R41841 Cognitive communication deficit: Secondary | ICD-10-CM | POA: Diagnosis not present

## 2017-11-28 DIAGNOSIS — R1311 Dysphagia, oral phase: Secondary | ICD-10-CM | POA: Diagnosis not present

## 2017-11-28 DIAGNOSIS — M6281 Muscle weakness (generalized): Secondary | ICD-10-CM | POA: Diagnosis not present

## 2017-11-28 DIAGNOSIS — G809 Cerebral palsy, unspecified: Secondary | ICD-10-CM | POA: Diagnosis not present

## 2017-11-28 DIAGNOSIS — R278 Other lack of coordination: Secondary | ICD-10-CM | POA: Diagnosis not present

## 2017-11-28 DIAGNOSIS — M25561 Pain in right knee: Secondary | ICD-10-CM | POA: Diagnosis not present

## 2017-11-28 DIAGNOSIS — K219 Gastro-esophageal reflux disease without esophagitis: Secondary | ICD-10-CM | POA: Diagnosis not present

## 2017-11-28 DIAGNOSIS — R29818 Other symptoms and signs involving the nervous system: Secondary | ICD-10-CM | POA: Diagnosis not present

## 2017-11-28 DIAGNOSIS — R293 Abnormal posture: Secondary | ICD-10-CM | POA: Diagnosis not present

## 2017-11-28 DIAGNOSIS — R1312 Dysphagia, oropharyngeal phase: Secondary | ICD-10-CM | POA: Diagnosis not present

## 2017-11-28 DIAGNOSIS — J189 Pneumonia, unspecified organism: Secondary | ICD-10-CM | POA: Diagnosis not present

## 2017-11-28 DIAGNOSIS — N390 Urinary tract infection, site not specified: Secondary | ICD-10-CM | POA: Diagnosis not present

## 2017-11-29 DIAGNOSIS — R41841 Cognitive communication deficit: Secondary | ICD-10-CM | POA: Diagnosis not present

## 2017-11-29 DIAGNOSIS — R293 Abnormal posture: Secondary | ICD-10-CM | POA: Diagnosis not present

## 2017-11-29 DIAGNOSIS — G809 Cerebral palsy, unspecified: Secondary | ICD-10-CM | POA: Diagnosis not present

## 2017-11-29 DIAGNOSIS — R29818 Other symptoms and signs involving the nervous system: Secondary | ICD-10-CM | POA: Diagnosis not present

## 2017-11-29 DIAGNOSIS — M25561 Pain in right knee: Secondary | ICD-10-CM | POA: Diagnosis not present

## 2017-11-29 DIAGNOSIS — R1311 Dysphagia, oral phase: Secondary | ICD-10-CM | POA: Diagnosis not present

## 2017-11-30 DIAGNOSIS — R41841 Cognitive communication deficit: Secondary | ICD-10-CM | POA: Diagnosis not present

## 2017-11-30 DIAGNOSIS — M25561 Pain in right knee: Secondary | ICD-10-CM | POA: Diagnosis not present

## 2017-11-30 DIAGNOSIS — R29818 Other symptoms and signs involving the nervous system: Secondary | ICD-10-CM | POA: Diagnosis not present

## 2017-11-30 DIAGNOSIS — G809 Cerebral palsy, unspecified: Secondary | ICD-10-CM | POA: Diagnosis not present

## 2017-11-30 DIAGNOSIS — R293 Abnormal posture: Secondary | ICD-10-CM | POA: Diagnosis not present

## 2017-11-30 DIAGNOSIS — R1311 Dysphagia, oral phase: Secondary | ICD-10-CM | POA: Diagnosis not present

## 2017-12-01 DIAGNOSIS — G809 Cerebral palsy, unspecified: Secondary | ICD-10-CM | POA: Diagnosis not present

## 2017-12-01 DIAGNOSIS — R41841 Cognitive communication deficit: Secondary | ICD-10-CM | POA: Diagnosis not present

## 2017-12-01 DIAGNOSIS — R29818 Other symptoms and signs involving the nervous system: Secondary | ICD-10-CM | POA: Diagnosis not present

## 2017-12-01 DIAGNOSIS — R1311 Dysphagia, oral phase: Secondary | ICD-10-CM | POA: Diagnosis not present

## 2017-12-01 DIAGNOSIS — R293 Abnormal posture: Secondary | ICD-10-CM | POA: Diagnosis not present

## 2017-12-01 DIAGNOSIS — M25561 Pain in right knee: Secondary | ICD-10-CM | POA: Diagnosis not present

## 2017-12-03 DIAGNOSIS — G809 Cerebral palsy, unspecified: Secondary | ICD-10-CM | POA: Diagnosis not present

## 2017-12-03 DIAGNOSIS — R41841 Cognitive communication deficit: Secondary | ICD-10-CM | POA: Diagnosis not present

## 2017-12-03 DIAGNOSIS — R1311 Dysphagia, oral phase: Secondary | ICD-10-CM | POA: Diagnosis not present

## 2017-12-03 DIAGNOSIS — R29818 Other symptoms and signs involving the nervous system: Secondary | ICD-10-CM | POA: Diagnosis not present

## 2017-12-03 DIAGNOSIS — M25561 Pain in right knee: Secondary | ICD-10-CM | POA: Diagnosis not present

## 2017-12-03 DIAGNOSIS — R293 Abnormal posture: Secondary | ICD-10-CM | POA: Diagnosis not present

## 2017-12-04 DIAGNOSIS — R293 Abnormal posture: Secondary | ICD-10-CM | POA: Diagnosis not present

## 2017-12-04 DIAGNOSIS — R41841 Cognitive communication deficit: Secondary | ICD-10-CM | POA: Diagnosis not present

## 2017-12-04 DIAGNOSIS — G809 Cerebral palsy, unspecified: Secondary | ICD-10-CM | POA: Diagnosis not present

## 2017-12-04 DIAGNOSIS — R29818 Other symptoms and signs involving the nervous system: Secondary | ICD-10-CM | POA: Diagnosis not present

## 2017-12-04 DIAGNOSIS — R1311 Dysphagia, oral phase: Secondary | ICD-10-CM | POA: Diagnosis not present

## 2017-12-04 DIAGNOSIS — M25561 Pain in right knee: Secondary | ICD-10-CM | POA: Diagnosis not present

## 2017-12-05 DIAGNOSIS — R1311 Dysphagia, oral phase: Secondary | ICD-10-CM | POA: Diagnosis not present

## 2017-12-05 DIAGNOSIS — G809 Cerebral palsy, unspecified: Secondary | ICD-10-CM | POA: Diagnosis not present

## 2017-12-05 DIAGNOSIS — M25561 Pain in right knee: Secondary | ICD-10-CM | POA: Diagnosis not present

## 2017-12-05 DIAGNOSIS — F329 Major depressive disorder, single episode, unspecified: Secondary | ICD-10-CM | POA: Diagnosis not present

## 2017-12-05 DIAGNOSIS — R293 Abnormal posture: Secondary | ICD-10-CM | POA: Diagnosis not present

## 2017-12-05 DIAGNOSIS — R41841 Cognitive communication deficit: Secondary | ICD-10-CM | POA: Diagnosis not present

## 2017-12-05 DIAGNOSIS — Z66 Do not resuscitate: Secondary | ICD-10-CM | POA: Diagnosis not present

## 2017-12-05 DIAGNOSIS — R29818 Other symptoms and signs involving the nervous system: Secondary | ICD-10-CM | POA: Diagnosis not present

## 2017-12-06 DIAGNOSIS — R41841 Cognitive communication deficit: Secondary | ICD-10-CM | POA: Diagnosis not present

## 2017-12-06 DIAGNOSIS — R29818 Other symptoms and signs involving the nervous system: Secondary | ICD-10-CM | POA: Diagnosis not present

## 2017-12-06 DIAGNOSIS — M25561 Pain in right knee: Secondary | ICD-10-CM | POA: Diagnosis not present

## 2017-12-06 DIAGNOSIS — R293 Abnormal posture: Secondary | ICD-10-CM | POA: Diagnosis not present

## 2017-12-06 DIAGNOSIS — G809 Cerebral palsy, unspecified: Secondary | ICD-10-CM | POA: Diagnosis not present

## 2017-12-06 DIAGNOSIS — R1311 Dysphagia, oral phase: Secondary | ICD-10-CM | POA: Diagnosis not present

## 2017-12-07 DIAGNOSIS — R1311 Dysphagia, oral phase: Secondary | ICD-10-CM | POA: Diagnosis not present

## 2017-12-07 DIAGNOSIS — R41841 Cognitive communication deficit: Secondary | ICD-10-CM | POA: Diagnosis not present

## 2017-12-07 DIAGNOSIS — R29818 Other symptoms and signs involving the nervous system: Secondary | ICD-10-CM | POA: Diagnosis not present

## 2017-12-07 DIAGNOSIS — M25561 Pain in right knee: Secondary | ICD-10-CM | POA: Diagnosis not present

## 2017-12-07 DIAGNOSIS — R293 Abnormal posture: Secondary | ICD-10-CM | POA: Diagnosis not present

## 2017-12-07 DIAGNOSIS — G809 Cerebral palsy, unspecified: Secondary | ICD-10-CM | POA: Diagnosis not present

## 2017-12-10 ENCOUNTER — Other Ambulatory Visit (HOSPITAL_COMMUNITY): Payer: Self-pay | Admitting: Internal Medicine

## 2017-12-10 DIAGNOSIS — G809 Cerebral palsy, unspecified: Secondary | ICD-10-CM | POA: Diagnosis not present

## 2017-12-10 DIAGNOSIS — M25561 Pain in right knee: Secondary | ICD-10-CM | POA: Diagnosis not present

## 2017-12-10 DIAGNOSIS — R1310 Dysphagia, unspecified: Secondary | ICD-10-CM

## 2017-12-10 DIAGNOSIS — R41841 Cognitive communication deficit: Secondary | ICD-10-CM | POA: Diagnosis not present

## 2017-12-10 DIAGNOSIS — R29818 Other symptoms and signs involving the nervous system: Secondary | ICD-10-CM | POA: Diagnosis not present

## 2017-12-10 DIAGNOSIS — R1311 Dysphagia, oral phase: Secondary | ICD-10-CM | POA: Diagnosis not present

## 2017-12-10 DIAGNOSIS — R293 Abnormal posture: Secondary | ICD-10-CM | POA: Diagnosis not present

## 2017-12-11 DIAGNOSIS — R293 Abnormal posture: Secondary | ICD-10-CM | POA: Diagnosis not present

## 2017-12-11 DIAGNOSIS — R41841 Cognitive communication deficit: Secondary | ICD-10-CM | POA: Diagnosis not present

## 2017-12-11 DIAGNOSIS — M25561 Pain in right knee: Secondary | ICD-10-CM | POA: Diagnosis not present

## 2017-12-11 DIAGNOSIS — G809 Cerebral palsy, unspecified: Secondary | ICD-10-CM | POA: Diagnosis not present

## 2017-12-11 DIAGNOSIS — R1311 Dysphagia, oral phase: Secondary | ICD-10-CM | POA: Diagnosis not present

## 2017-12-11 DIAGNOSIS — R29818 Other symptoms and signs involving the nervous system: Secondary | ICD-10-CM | POA: Diagnosis not present

## 2017-12-12 DIAGNOSIS — R41841 Cognitive communication deficit: Secondary | ICD-10-CM | POA: Diagnosis not present

## 2017-12-12 DIAGNOSIS — R293 Abnormal posture: Secondary | ICD-10-CM | POA: Diagnosis not present

## 2017-12-12 DIAGNOSIS — R1311 Dysphagia, oral phase: Secondary | ICD-10-CM | POA: Diagnosis not present

## 2017-12-12 DIAGNOSIS — R29818 Other symptoms and signs involving the nervous system: Secondary | ICD-10-CM | POA: Diagnosis not present

## 2017-12-12 DIAGNOSIS — G809 Cerebral palsy, unspecified: Secondary | ICD-10-CM | POA: Diagnosis not present

## 2017-12-12 DIAGNOSIS — M25561 Pain in right knee: Secondary | ICD-10-CM | POA: Diagnosis not present

## 2017-12-13 DIAGNOSIS — R293 Abnormal posture: Secondary | ICD-10-CM | POA: Diagnosis not present

## 2017-12-13 DIAGNOSIS — M25561 Pain in right knee: Secondary | ICD-10-CM | POA: Diagnosis not present

## 2017-12-13 DIAGNOSIS — R1311 Dysphagia, oral phase: Secondary | ICD-10-CM | POA: Diagnosis not present

## 2017-12-13 DIAGNOSIS — G809 Cerebral palsy, unspecified: Secondary | ICD-10-CM | POA: Diagnosis not present

## 2017-12-13 DIAGNOSIS — R29818 Other symptoms and signs involving the nervous system: Secondary | ICD-10-CM | POA: Diagnosis not present

## 2017-12-13 DIAGNOSIS — R41841 Cognitive communication deficit: Secondary | ICD-10-CM | POA: Diagnosis not present

## 2017-12-14 DIAGNOSIS — R41841 Cognitive communication deficit: Secondary | ICD-10-CM | POA: Diagnosis not present

## 2017-12-14 DIAGNOSIS — M25561 Pain in right knee: Secondary | ICD-10-CM | POA: Diagnosis not present

## 2017-12-14 DIAGNOSIS — G809 Cerebral palsy, unspecified: Secondary | ICD-10-CM | POA: Diagnosis not present

## 2017-12-14 DIAGNOSIS — R293 Abnormal posture: Secondary | ICD-10-CM | POA: Diagnosis not present

## 2017-12-14 DIAGNOSIS — R1311 Dysphagia, oral phase: Secondary | ICD-10-CM | POA: Diagnosis not present

## 2017-12-14 DIAGNOSIS — R29818 Other symptoms and signs involving the nervous system: Secondary | ICD-10-CM | POA: Diagnosis not present

## 2017-12-17 ENCOUNTER — Encounter (HOSPITAL_COMMUNITY): Payer: Commercial Indemnity

## 2017-12-17 ENCOUNTER — Ambulatory Visit (HOSPITAL_COMMUNITY): Payer: Medicare Other

## 2017-12-17 DIAGNOSIS — R29818 Other symptoms and signs involving the nervous system: Secondary | ICD-10-CM | POA: Diagnosis not present

## 2017-12-17 DIAGNOSIS — R293 Abnormal posture: Secondary | ICD-10-CM | POA: Diagnosis not present

## 2017-12-17 DIAGNOSIS — M25561 Pain in right knee: Secondary | ICD-10-CM | POA: Diagnosis not present

## 2017-12-17 DIAGNOSIS — R1311 Dysphagia, oral phase: Secondary | ICD-10-CM | POA: Diagnosis not present

## 2017-12-17 DIAGNOSIS — G809 Cerebral palsy, unspecified: Secondary | ICD-10-CM | POA: Diagnosis not present

## 2017-12-17 DIAGNOSIS — R41841 Cognitive communication deficit: Secondary | ICD-10-CM | POA: Diagnosis not present

## 2017-12-18 DIAGNOSIS — K219 Gastro-esophageal reflux disease without esophagitis: Secondary | ICD-10-CM | POA: Diagnosis not present

## 2017-12-18 DIAGNOSIS — R21 Rash and other nonspecific skin eruption: Secondary | ICD-10-CM | POA: Diagnosis not present

## 2017-12-18 DIAGNOSIS — R29818 Other symptoms and signs involving the nervous system: Secondary | ICD-10-CM | POA: Diagnosis not present

## 2017-12-18 DIAGNOSIS — M25561 Pain in right knee: Secondary | ICD-10-CM | POA: Diagnosis not present

## 2017-12-18 DIAGNOSIS — R41841 Cognitive communication deficit: Secondary | ICD-10-CM | POA: Diagnosis not present

## 2017-12-18 DIAGNOSIS — R1311 Dysphagia, oral phase: Secondary | ICD-10-CM | POA: Diagnosis not present

## 2017-12-18 DIAGNOSIS — R293 Abnormal posture: Secondary | ICD-10-CM | POA: Diagnosis not present

## 2017-12-18 DIAGNOSIS — D509 Iron deficiency anemia, unspecified: Secondary | ICD-10-CM | POA: Diagnosis not present

## 2017-12-18 DIAGNOSIS — G809 Cerebral palsy, unspecified: Secondary | ICD-10-CM | POA: Diagnosis not present

## 2017-12-19 ENCOUNTER — Ambulatory Visit (HOSPITAL_COMMUNITY)
Admission: RE | Admit: 2017-12-19 | Discharge: 2017-12-19 | Disposition: A | Discharge status: Home / Self Care | Payer: Medicare Other | Source: Ambulatory Visit | Attending: Internal Medicine | Admitting: Internal Medicine

## 2017-12-19 ENCOUNTER — Ambulatory Visit (HOSPITAL_COMMUNITY)
Admission: RE | Admit: 2017-12-19 | Discharge: 2017-12-19 | Disposition: A | Discharge status: Home / Self Care | Payer: No Typology Code available for payment source | Source: Ambulatory Visit | Attending: Internal Medicine | Admitting: Internal Medicine

## 2017-12-19 DIAGNOSIS — R1310 Dysphagia, unspecified: Secondary | ICD-10-CM | POA: Insufficient documentation

## 2017-12-19 DIAGNOSIS — F331 Major depressive disorder, recurrent, moderate: Secondary | ICD-10-CM | POA: Diagnosis not present

## 2017-12-19 DIAGNOSIS — G809 Cerebral palsy, unspecified: Secondary | ICD-10-CM | POA: Diagnosis not present

## 2017-12-19 DIAGNOSIS — R29818 Other symptoms and signs involving the nervous system: Secondary | ICD-10-CM | POA: Diagnosis not present

## 2017-12-19 DIAGNOSIS — R1311 Dysphagia, oral phase: Secondary | ICD-10-CM | POA: Diagnosis not present

## 2017-12-19 DIAGNOSIS — M25561 Pain in right knee: Secondary | ICD-10-CM | POA: Diagnosis not present

## 2017-12-19 DIAGNOSIS — R41841 Cognitive communication deficit: Secondary | ICD-10-CM | POA: Diagnosis not present

## 2017-12-19 DIAGNOSIS — E785 Hyperlipidemia, unspecified: Secondary | ICD-10-CM | POA: Insufficient documentation

## 2017-12-19 DIAGNOSIS — F411 Generalized anxiety disorder: Secondary | ICD-10-CM | POA: Diagnosis not present

## 2017-12-19 DIAGNOSIS — R293 Abnormal posture: Secondary | ICD-10-CM | POA: Diagnosis not present

## 2017-12-19 NOTE — Progress Notes (Signed)
Objective Swallowing Evaluation: Type of Study: MBS-Modified Barium Swallow Study   Patient Details  Name: [name] MRN: [id number] Date of Birth: 1936/05/28  Today's Date: 12/19/2017 Time: SLP Start Time (ACUTE ONLY): 1110 -SLP Stop Time (ACUTE ONLY): 1140  SLP Time Calculation (min) (ACUTE ONLY): 30 min   Past Medical History:  Past Medical History:  Diagnosis Date  . Cerebral palsy ([place])   . Constipation   . Hyperlipidemia    Past Surgical History:  Past Surgical History:  Procedure Laterality Date  . ESOPHAGOGASTRODUODENOSCOPY  01/28/2012   Procedure: ESOPHAGOGASTRODUODENOSCOPY (EGD);  Surgeon: Lear Ng, MD;  Location: [facility] ENDOSCOPY;  Service: Endoscopy;  Laterality: N/A;  . ESOPHAGOGASTRODUODENOSCOPY N/A 05/10/2014   Procedure: ESOPHAGOGASTRODUODENOSCOPY (EGD);  Surgeon: Missy Sabins, MD;  Location[facility] ENDOSCOPY;  Service: Endoscopy;  Laterality: N/A;  . right hip surgery    . TONSILLECTOMY     HPI: 82 y.o. female with hx of cerebral palsy, resident of [facility] since 2013, referred for OPMBS.  Pt has undergone at least two prior MBS studies, the last 11/06/17, which revealed mild, motor-based dysphagia with prolonged mastication, lingual/palatal residue.  Transient penetration noted, mild vallecular residue; no aspiration.  Dysphagia 3, thin liquids (straws ok), meds crushed was the recommendation.    Subjective: alert, communicative; cousin and POA, Wendall Stade, was present    Assessment / Plan / Recommendation  CHL IP CLINICAL IMPRESSIONS 12/19/2017  Clinical Impression Pt presents with swallow function that appears to be consistent with findings from last year's MBS.  There continues to be prolonged, but functional mastication with regular solids.  Swallow response is triggered at the level of the pyriforms with thin liquids - this leads to penetration into the laryngeal vestibule before the swallow, but the penetrate is ejected upon descent of the larynx,  with no remaining penetrate and no aspiration (Penetration-Aspiration Scale #2, considered normal). There is minimal residue of purees in the valleculae post-swallow.  Current study does not account for performance over time or predict how function might change with different positioning.  Based on today's performance, do not recommend alterations in current diet.  Provide meds crushed in puree if larger than 13 mm; whole in puree if <13 mm. Discussed results with pt.  Copy of report will be sent to POA, Shirlean Mylar, per her request.   SLP Visit Diagnosis Dysphagia, unspecified (R13.10)  Attention and concentration deficit following --  Frontal lobe and executive function deficit following --  Impact on safety and function No limitations      CHL IP TREATMENT RECOMMENDATION 12/19/2017  Treatment Recommendations Defer treatment plan to f/u with SLP     No flowsheet data found.  CHL IP DIET RECOMMENDATION 12/19/2017  SLP Diet Recommendations Other (Comment)  Liquid Administration via Cup;Straw  Medication Administration Crushed with puree  Compensations --  Postural Changes --      CHL IP OTHER RECOMMENDATIONS 12/19/2017  Recommended Consults --  Oral Care Recommendations Oral care BID  Other Recommendations --      CHL IP FOLLOW UP RECOMMENDATIONS 11/06/2016  Follow up Recommendations Skilled Nursing facility      No flowsheet data found.                CHL IP GO 11/06/2016  Functional Assessment Tool Used (No Data)  Functional Limitations Swallowing  Swallow Current Status (T2671) CJ  Swallow Goal Status (I4580) [place]  Swallow Discharge Status (D9833) CJ  Motor Speech Current Status [phone]) (None)  Motor Speech  Goal Status [phone]) (None)  Motor Speech Goal Status [phone]) (None)  Spoken Language Comprehension Current Status [phone]) (None)  Spoken Language Comprehension Goal Status [phone]) (None)  Spoken Language Comprehension Discharge Status [phone]) (None)  Spoken Language  Expression Current Status [phone]) (None)  Spoken Language Expression Goal Status [phone]) (None)  Spoken Language Expression Discharge Status [phone]) (None)  Attention Current Status (C4818) (None)  Attention Goal Status (H9093) (None)  Attention Discharge Status [phone]) (None)  Memory Current Status (K4469) (None)  Memory Goal Status (F0722) (None)  Memory Discharge Status (V7505) (None)  Voice Current Status (X8335) (None)  Voice Goal Status (O2518) (None)  Voice Discharge Status (F8421) (None)  Other Speech-Language Pathology Functional Limitation Current Status (I3128) (None)  Other Speech-Language Pathology Functional Limitation Goal Status (F1886) (None)  Other Speech-Language Pathology Functional Limitation Discharge Status [phone]) (None)    Juan Quam Laurice 12/19/2017, 5:37 PM

## 2017-12-20 DIAGNOSIS — R293 Abnormal posture: Secondary | ICD-10-CM | POA: Diagnosis not present

## 2017-12-20 DIAGNOSIS — M25561 Pain in right knee: Secondary | ICD-10-CM | POA: Diagnosis not present

## 2017-12-20 DIAGNOSIS — R41841 Cognitive communication deficit: Secondary | ICD-10-CM | POA: Diagnosis not present

## 2017-12-20 DIAGNOSIS — R1311 Dysphagia, oral phase: Secondary | ICD-10-CM | POA: Diagnosis not present

## 2017-12-20 DIAGNOSIS — G809 Cerebral palsy, unspecified: Secondary | ICD-10-CM | POA: Diagnosis not present

## 2017-12-20 DIAGNOSIS — R29818 Other symptoms and signs involving the nervous system: Secondary | ICD-10-CM | POA: Diagnosis not present

## 2017-12-24 DIAGNOSIS — G809 Cerebral palsy, unspecified: Secondary | ICD-10-CM | POA: Diagnosis not present

## 2017-12-24 DIAGNOSIS — R1311 Dysphagia, oral phase: Secondary | ICD-10-CM | POA: Diagnosis not present

## 2017-12-24 DIAGNOSIS — R29818 Other symptoms and signs involving the nervous system: Secondary | ICD-10-CM | POA: Diagnosis not present

## 2017-12-24 DIAGNOSIS — R293 Abnormal posture: Secondary | ICD-10-CM | POA: Diagnosis not present

## 2017-12-24 DIAGNOSIS — M25561 Pain in right knee: Secondary | ICD-10-CM | POA: Diagnosis not present

## 2017-12-24 DIAGNOSIS — R41841 Cognitive communication deficit: Secondary | ICD-10-CM | POA: Diagnosis not present

## 2017-12-25 DIAGNOSIS — G809 Cerebral palsy, unspecified: Secondary | ICD-10-CM | POA: Diagnosis not present

## 2017-12-25 DIAGNOSIS — M25561 Pain in right knee: Secondary | ICD-10-CM | POA: Diagnosis not present

## 2017-12-25 DIAGNOSIS — R293 Abnormal posture: Secondary | ICD-10-CM | POA: Diagnosis not present

## 2017-12-25 DIAGNOSIS — R1311 Dysphagia, oral phase: Secondary | ICD-10-CM | POA: Diagnosis not present

## 2017-12-25 DIAGNOSIS — R29818 Other symptoms and signs involving the nervous system: Secondary | ICD-10-CM | POA: Diagnosis not present

## 2017-12-25 DIAGNOSIS — R41841 Cognitive communication deficit: Secondary | ICD-10-CM | POA: Diagnosis not present

## 2017-12-26 DIAGNOSIS — R1311 Dysphagia, oral phase: Secondary | ICD-10-CM | POA: Diagnosis not present

## 2017-12-26 DIAGNOSIS — R293 Abnormal posture: Secondary | ICD-10-CM | POA: Diagnosis not present

## 2017-12-26 DIAGNOSIS — R41841 Cognitive communication deficit: Secondary | ICD-10-CM | POA: Diagnosis not present

## 2017-12-26 DIAGNOSIS — R29818 Other symptoms and signs involving the nervous system: Secondary | ICD-10-CM | POA: Diagnosis not present

## 2017-12-26 DIAGNOSIS — M25561 Pain in right knee: Secondary | ICD-10-CM | POA: Diagnosis not present

## 2017-12-26 DIAGNOSIS — G809 Cerebral palsy, unspecified: Secondary | ICD-10-CM | POA: Diagnosis not present

## 2017-12-27 DIAGNOSIS — R293 Abnormal posture: Secondary | ICD-10-CM | POA: Diagnosis not present

## 2017-12-27 DIAGNOSIS — R29818 Other symptoms and signs involving the nervous system: Secondary | ICD-10-CM | POA: Diagnosis not present

## 2017-12-27 DIAGNOSIS — R1311 Dysphagia, oral phase: Secondary | ICD-10-CM | POA: Diagnosis not present

## 2017-12-27 DIAGNOSIS — M25561 Pain in right knee: Secondary | ICD-10-CM | POA: Diagnosis not present

## 2017-12-27 DIAGNOSIS — G809 Cerebral palsy, unspecified: Secondary | ICD-10-CM | POA: Diagnosis not present

## 2017-12-27 DIAGNOSIS — R41841 Cognitive communication deficit: Secondary | ICD-10-CM | POA: Diagnosis not present

## 2018-01-02 DIAGNOSIS — R6889 Other general symptoms and signs: Secondary | ICD-10-CM | POA: Diagnosis not present

## 2018-01-02 DIAGNOSIS — N390 Urinary tract infection, site not specified: Secondary | ICD-10-CM | POA: Diagnosis not present

## 2018-01-02 DIAGNOSIS — R112 Nausea with vomiting, unspecified: Secondary | ICD-10-CM | POA: Diagnosis not present

## 2018-01-02 DIAGNOSIS — R109 Unspecified abdominal pain: Secondary | ICD-10-CM | POA: Diagnosis not present

## 2018-01-02 DIAGNOSIS — R1111 Vomiting without nausea: Secondary | ICD-10-CM | POA: Diagnosis not present

## 2018-01-02 DIAGNOSIS — R1110 Vomiting, unspecified: Secondary | ICD-10-CM | POA: Diagnosis not present

## 2018-01-02 DIAGNOSIS — R319 Hematuria, unspecified: Secondary | ICD-10-CM | POA: Diagnosis not present

## 2018-01-02 DIAGNOSIS — G809 Cerebral palsy, unspecified: Secondary | ICD-10-CM | POA: Diagnosis not present

## 2018-01-02 DIAGNOSIS — K449 Diaphragmatic hernia without obstruction or gangrene: Secondary | ICD-10-CM | POA: Diagnosis not present

## 2018-01-02 DIAGNOSIS — D649 Anemia, unspecified: Secondary | ICD-10-CM | POA: Diagnosis not present

## 2018-01-02 DIAGNOSIS — Z79899 Other long term (current) drug therapy: Secondary | ICD-10-CM | POA: Diagnosis not present

## 2018-01-02 DIAGNOSIS — R0902 Hypoxemia: Secondary | ICD-10-CM | POA: Diagnosis not present

## 2018-01-03 DIAGNOSIS — D72829 Elevated white blood cell count, unspecified: Secondary | ICD-10-CM | POA: Diagnosis not present

## 2018-01-03 DIAGNOSIS — R0902 Hypoxemia: Secondary | ICD-10-CM | POA: Diagnosis not present

## 2018-01-03 DIAGNOSIS — G809 Cerebral palsy, unspecified: Secondary | ICD-10-CM | POA: Diagnosis not present

## 2018-01-03 DIAGNOSIS — R1110 Vomiting, unspecified: Secondary | ICD-10-CM | POA: Diagnosis not present

## 2018-01-09 DIAGNOSIS — I10 Essential (primary) hypertension: Secondary | ICD-10-CM | POA: Diagnosis not present

## 2018-01-09 DIAGNOSIS — D649 Anemia, unspecified: Secondary | ICD-10-CM | POA: Diagnosis not present

## 2018-01-16 DIAGNOSIS — F331 Major depressive disorder, recurrent, moderate: Secondary | ICD-10-CM | POA: Diagnosis not present

## 2018-01-16 DIAGNOSIS — F064 Anxiety disorder due to known physiological condition: Secondary | ICD-10-CM | POA: Diagnosis not present

## 2018-01-24 DIAGNOSIS — Z79899 Other long term (current) drug therapy: Secondary | ICD-10-CM | POA: Diagnosis not present

## 2018-01-25 DIAGNOSIS — B351 Tinea unguium: Secondary | ICD-10-CM | POA: Diagnosis not present

## 2018-01-25 DIAGNOSIS — I739 Peripheral vascular disease, unspecified: Secondary | ICD-10-CM | POA: Diagnosis not present

## 2018-01-25 DIAGNOSIS — L603 Nail dystrophy: Secondary | ICD-10-CM | POA: Diagnosis not present

## 2018-01-25 DIAGNOSIS — Q845 Enlarged and hypertrophic nails: Secondary | ICD-10-CM | POA: Diagnosis not present

## 2018-02-27 DIAGNOSIS — G809 Cerebral palsy, unspecified: Secondary | ICD-10-CM | POA: Diagnosis not present

## 2018-02-27 DIAGNOSIS — F322 Major depressive disorder, single episode, severe without psychotic features: Secondary | ICD-10-CM | POA: Diagnosis not present

## 2018-03-04 DIAGNOSIS — E559 Vitamin D deficiency, unspecified: Secondary | ICD-10-CM | POA: Diagnosis not present

## 2018-03-04 DIAGNOSIS — E039 Hypothyroidism, unspecified: Secondary | ICD-10-CM | POA: Diagnosis not present

## 2018-03-04 DIAGNOSIS — Z79899 Other long term (current) drug therapy: Secondary | ICD-10-CM | POA: Diagnosis not present

## 2018-03-04 DIAGNOSIS — I10 Essential (primary) hypertension: Secondary | ICD-10-CM | POA: Diagnosis not present

## 2018-03-04 DIAGNOSIS — D649 Anemia, unspecified: Secondary | ICD-10-CM | POA: Diagnosis not present

## 2018-03-06 DIAGNOSIS — F331 Major depressive disorder, recurrent, moderate: Secondary | ICD-10-CM | POA: Diagnosis not present

## 2018-03-06 DIAGNOSIS — F064 Anxiety disorder due to known physiological condition: Secondary | ICD-10-CM | POA: Diagnosis not present

## 2018-04-02 DIAGNOSIS — R1110 Vomiting, unspecified: Secondary | ICD-10-CM | POA: Diagnosis not present

## 2018-04-02 DIAGNOSIS — G809 Cerebral palsy, unspecified: Secondary | ICD-10-CM | POA: Diagnosis not present

## 2018-04-02 DIAGNOSIS — K5900 Constipation, unspecified: Secondary | ICD-10-CM | POA: Diagnosis not present

## 2018-04-02 DIAGNOSIS — R0902 Hypoxemia: Secondary | ICD-10-CM | POA: Diagnosis not present

## 2018-04-02 DIAGNOSIS — K5909 Other constipation: Secondary | ICD-10-CM | POA: Diagnosis not present

## 2018-04-03 DIAGNOSIS — F331 Major depressive disorder, recurrent, moderate: Secondary | ICD-10-CM | POA: Diagnosis not present

## 2018-04-03 DIAGNOSIS — F064 Anxiety disorder due to known physiological condition: Secondary | ICD-10-CM | POA: Diagnosis not present

## 2018-04-04 DIAGNOSIS — L603 Nail dystrophy: Secondary | ICD-10-CM | POA: Diagnosis not present

## 2018-04-04 DIAGNOSIS — B351 Tinea unguium: Secondary | ICD-10-CM | POA: Diagnosis not present

## 2018-04-04 DIAGNOSIS — Q845 Enlarged and hypertrophic nails: Secondary | ICD-10-CM | POA: Diagnosis not present

## 2018-04-04 DIAGNOSIS — I739 Peripheral vascular disease, unspecified: Secondary | ICD-10-CM | POA: Diagnosis not present

## 2018-05-01 DIAGNOSIS — G809 Cerebral palsy, unspecified: Secondary | ICD-10-CM | POA: Diagnosis not present

## 2018-05-01 DIAGNOSIS — Z66 Do not resuscitate: Secondary | ICD-10-CM | POA: Diagnosis not present

## 2018-05-15 DIAGNOSIS — F064 Anxiety disorder due to known physiological condition: Secondary | ICD-10-CM | POA: Diagnosis not present

## 2018-05-15 DIAGNOSIS — F331 Major depressive disorder, recurrent, moderate: Secondary | ICD-10-CM | POA: Diagnosis not present

## 2018-06-07 DIAGNOSIS — Q845 Enlarged and hypertrophic nails: Secondary | ICD-10-CM | POA: Diagnosis not present

## 2018-06-07 DIAGNOSIS — I739 Peripheral vascular disease, unspecified: Secondary | ICD-10-CM | POA: Diagnosis not present

## 2018-06-07 DIAGNOSIS — L603 Nail dystrophy: Secondary | ICD-10-CM | POA: Diagnosis not present

## 2018-06-07 DIAGNOSIS — B351 Tinea unguium: Secondary | ICD-10-CM | POA: Diagnosis not present

## 2018-06-25 ENCOUNTER — Telehealth: Payer: Self-pay | Admitting: Internal Medicine

## 2018-06-25 ENCOUNTER — Encounter: Payer: Self-pay | Admitting: Internal Medicine

## 2018-06-25 ENCOUNTER — Non-Acute Institutional Stay: Payer: Medicare Other | Admitting: Internal Medicine

## 2018-06-25 VITALS — BP 106/60 | HR 64 | Resp 16

## 2018-06-25 DIAGNOSIS — Z79899 Other long term (current) drug therapy: Secondary | ICD-10-CM | POA: Diagnosis not present

## 2018-06-25 DIAGNOSIS — H109 Unspecified conjunctivitis: Secondary | ICD-10-CM | POA: Diagnosis not present

## 2018-06-25 DIAGNOSIS — R531 Weakness: Secondary | ICD-10-CM | POA: Diagnosis not present

## 2018-06-25 DIAGNOSIS — E785 Hyperlipidemia, unspecified: Secondary | ICD-10-CM | POA: Diagnosis not present

## 2018-06-25 NOTE — Progress Notes (Signed)
[facility] Telephone: [phone] Fax: [phone]  PATIENT NAME: [name] DOB: 11-Sep-1935 MRN: [id number]  PRIMARY CARE PROVIDER:   Reynold Bowen, MD  REFERRING PROVIDER:  Reynold Bowen, MD [address] [place], [place] [address]  RESPONSIBLE PARTY:   Shanon Brow and Wendall Stade [phone]. (cousins)  HISTORY OF PRESENT ILLNESS:  [name] is a 82 yo woman with medical h/o cerebral palsy, spastic paraplegia, and dysphagia. Routine follow up visit today to assess patient's status.  Nurse denies any significant decline.  IMPRESSION / RECOMMENDATIONS:  1. Weakness - Patient without appreciable functional decline since last [facility] visit 9 months earlier. She is wheelchair bound, and is a non-weight bearing 2 person transfer. She is dependent for dressing and hygiene. She is incontinent of bowel and bladder. She is alert and oriented to person and place. Staff report patient is maintaining her weight. Oral intake of 50-75% of meals. Patient denies pain or muscle spasms  2. Left eye conjunctivitis: Over the last week patient c/o left eye burning. No drainage. There is mild redness of conjunctiva and eye lids.  -discussed with PA Suzzanne Cloud consider eye drops  3. Follow up: Discussed visit with the husband of patient's cousin (who is patient's HCPOA) with updates. F/U NP visit 3-4 months.  I spent 25  minutes providing this consultation (from 2pm to 2:25pm). More than 50% of the time in this consultation was spent coordinating communication.   CODE STATUS: DNR. MOST: Scope of medical care limited to comfort measures. Determine antibiotic use at time of infection. No IVFs. No feeding tube.  PPS: 30%  HOSPICE ELIGIBILITY/DIAGNOSIS: No, as prognosis thought to be greater than 6 months.  PAST MEDICAL HISTORY:  Past Medical History:  Diagnosis Date  . Cerebral palsy ([place])   . Constipation   . Hyperlipidemia     SOCIAL HX:  Social History   Tobacco Use  . Smoking  status: Never Smoker  . Smokeless tobacco: Never Used  Substance Use Topics  . Alcohol use: No    ALLERGIES: No Known Allergies   PERTINENT MEDICATIONS:  Outpatient Encounter Medications as of 06/25/2018  Medication Sig  . albuterol (2.5 MG/3ML) 0.083% NEBU 3 mL, albuterol (5 MG/ML) 0.5% NEBU 0.5 mL Inhale 2.5 mg into the lungs every 6 (six) hours. Albuterol sul 2.5mg /11ml solution q6hr prn wheezing  . atorvastatin (LIPITOR) 10 MG tablet Take 10 mg by mouth daily.  . bisacodyl (DULCOLAX) 10 MG suppository Place 10 mg rectally every 3 (three) days.  . cholecalciferol (VITAMIN D) 1000 UNITS tablet Take 2,000 Units by mouth daily.  . citalopram (CELEXA) 10 MG tablet Take 20 mg by mouth daily. 1/2 tab (5mg ) po qd  . Cranberry 450 MG CAPS Take 1 capsule by mouth 2 (two) times daily.  . diazepam (VALIUM) 2 MG tablet Take 2 mg by mouth 2 (two) times daily. 1/2  tab (66mf) twice a day for muscle spasms  . diclofenac sodium (VOLTAREN) 1 % GEL Apply 2 g topically 3 (three) times daily as needed.  . docusate sodium (COLACE) 100 MG capsule Take 100 mg by mouth 2 (two) times daily.  . ferrous gluconate (FERGON) 324 MG tablet Take 324 mg by mouth every evening.  . fluticasone (FLONASE) 50 MCG/ACT nasal spray Place 1 spray into both nostrils daily.  Marland Kitchen guaifenesin (ROBITUSSIN) 100 MG/5ML syrup Take 200 mg by mouth 4 (four) times daily as needed for cough.  Marland Kitchen HYDROcodone-acetaminophen (NORCO/VICODIN) 5-325 MG per tablet Take 1 tablet  by mouth 2 (two) times daily.   Marland Kitchen ketoconazole (NIZORAL) 2 % cream Apply 1 application topically daily. Shampoo hair twice weekly for treatment of dry scalp  . loperamide (IMODIUM A-D) 2 MG tablet Take 2 mg by mouth 4 (four) times daily as needed for diarrhea or loose stools. Give 4mg  po prn loose stools. May repeat 2mg  po tid for additional loose stools  . ondansetron (ZOFRAN) 4 MG tablet Take 4 mg by mouth every 8 (eight) hours as needed for nausea or vomiting.  . pantoprazole  (PROTONIX) 40 MG tablet Take 1 tablet (40 mg total) by mouth 2 (two) times daily.  . polyethylene glycol (MIRALAX / GLYCOLAX) packet Take 17 g by mouth at bedtime.  . promethazine (PHENERGAN) 25 MG/ML injection Inject 25 mg into the muscle every 6 (six) hours as needed for nausea or vomiting. For nausea and vomiting  . saccharomyces boulardii (FLORASTOR) 250 MG capsule Take 250 mg by mouth 2 (two) times daily.  Marland Kitchen senna (SENOKOT) 8.6 MG tablet Take 1 tablet by mouth at bedtime.  . sucralfate (CARAFATE) 1 g tablet Take 1 g by mouth 2 (two) times daily.  . [DISCONTINUED] prochlorperazine (COMPAZINE) 5 MG tablet Take 5 mg by mouth 3 (three) times daily.  . [DISCONTINUED] Sennosides (SENOKOT PO) Take by mouth daily.   No facility-administered encounter medications on file as of 06/25/2018.     PHYSICAL EXAM:  VS: 106/60, HR: 64, RR 16 General: NAD, frail appearing, slender, no acute distress. Pleasantly conversant with halting speech. Maintaining good eye contact.  Cardiovascular: regular rate and rhythm. Soft systolic murmur Pulmonary: clear ant fields Abdomen: soft, nontender, + bowel sounds GU: no suprapubic tenderness Extremities: no edema, no joint deformities Skin: no rashes Neurological: Weakness but otherwise nonfocal  Julianne Handler, NP

## 2018-06-25 NOTE — Telephone Encounter (Signed)
1430 Phone call. Spoke with the husband of patient's cousin, Shanon Brow. Update of today's visit. Discussed left eye conjunctivitis. Spoke facility PA, Suzzanne Cloud who plans to order eye drops.  Violeta Gelinas, NP-C

## 2018-06-26 DIAGNOSIS — K219 Gastro-esophageal reflux disease without esophagitis: Secondary | ICD-10-CM | POA: Diagnosis not present

## 2018-06-26 DIAGNOSIS — G809 Cerebral palsy, unspecified: Secondary | ICD-10-CM | POA: Diagnosis not present

## 2018-06-26 DIAGNOSIS — H1013 Acute atopic conjunctivitis, bilateral: Secondary | ICD-10-CM | POA: Diagnosis not present

## 2018-06-26 DIAGNOSIS — F341 Dysthymic disorder: Secondary | ICD-10-CM | POA: Diagnosis not present

## 2018-07-03 DIAGNOSIS — K5900 Constipation, unspecified: Secondary | ICD-10-CM | POA: Diagnosis not present

## 2018-07-03 DIAGNOSIS — F331 Major depressive disorder, recurrent, moderate: Secondary | ICD-10-CM | POA: Diagnosis not present

## 2018-07-03 DIAGNOSIS — G809 Cerebral palsy, unspecified: Secondary | ICD-10-CM | POA: Diagnosis not present

## 2018-07-03 DIAGNOSIS — F064 Anxiety disorder due to known physiological condition: Secondary | ICD-10-CM | POA: Diagnosis not present

## 2018-07-19 DIAGNOSIS — R278 Other lack of coordination: Secondary | ICD-10-CM | POA: Diagnosis not present

## 2018-07-19 DIAGNOSIS — M25561 Pain in right knee: Secondary | ICD-10-CM | POA: Diagnosis not present

## 2018-07-19 DIAGNOSIS — R29818 Other symptoms and signs involving the nervous system: Secondary | ICD-10-CM | POA: Diagnosis not present

## 2018-07-19 DIAGNOSIS — G809 Cerebral palsy, unspecified: Secondary | ICD-10-CM | POA: Diagnosis not present

## 2018-07-19 DIAGNOSIS — R293 Abnormal posture: Secondary | ICD-10-CM | POA: Diagnosis not present

## 2018-07-19 DIAGNOSIS — R1311 Dysphagia, oral phase: Secondary | ICD-10-CM | POA: Diagnosis not present

## 2018-07-19 DIAGNOSIS — R1312 Dysphagia, oropharyngeal phase: Secondary | ICD-10-CM | POA: Diagnosis not present

## 2018-07-19 DIAGNOSIS — R41841 Cognitive communication deficit: Secondary | ICD-10-CM | POA: Diagnosis not present

## 2018-07-20 DIAGNOSIS — M25561 Pain in right knee: Secondary | ICD-10-CM | POA: Diagnosis not present

## 2018-07-20 DIAGNOSIS — R1311 Dysphagia, oral phase: Secondary | ICD-10-CM | POA: Diagnosis not present

## 2018-07-20 DIAGNOSIS — G809 Cerebral palsy, unspecified: Secondary | ICD-10-CM | POA: Diagnosis not present

## 2018-07-20 DIAGNOSIS — R293 Abnormal posture: Secondary | ICD-10-CM | POA: Diagnosis not present

## 2018-07-20 DIAGNOSIS — R41841 Cognitive communication deficit: Secondary | ICD-10-CM | POA: Diagnosis not present

## 2018-07-20 DIAGNOSIS — R29818 Other symptoms and signs involving the nervous system: Secondary | ICD-10-CM | POA: Diagnosis not present

## 2018-07-22 DIAGNOSIS — R41841 Cognitive communication deficit: Secondary | ICD-10-CM | POA: Diagnosis not present

## 2018-07-22 DIAGNOSIS — M25561 Pain in right knee: Secondary | ICD-10-CM | POA: Diagnosis not present

## 2018-07-22 DIAGNOSIS — R293 Abnormal posture: Secondary | ICD-10-CM | POA: Diagnosis not present

## 2018-07-22 DIAGNOSIS — G809 Cerebral palsy, unspecified: Secondary | ICD-10-CM | POA: Diagnosis not present

## 2018-07-22 DIAGNOSIS — R1311 Dysphagia, oral phase: Secondary | ICD-10-CM | POA: Diagnosis not present

## 2018-07-22 DIAGNOSIS — R29818 Other symptoms and signs involving the nervous system: Secondary | ICD-10-CM | POA: Diagnosis not present

## 2018-07-23 DIAGNOSIS — M25561 Pain in right knee: Secondary | ICD-10-CM | POA: Diagnosis not present

## 2018-07-23 DIAGNOSIS — R29818 Other symptoms and signs involving the nervous system: Secondary | ICD-10-CM | POA: Diagnosis not present

## 2018-07-23 DIAGNOSIS — R293 Abnormal posture: Secondary | ICD-10-CM | POA: Diagnosis not present

## 2018-07-23 DIAGNOSIS — R41841 Cognitive communication deficit: Secondary | ICD-10-CM | POA: Diagnosis not present

## 2018-07-23 DIAGNOSIS — G809 Cerebral palsy, unspecified: Secondary | ICD-10-CM | POA: Diagnosis not present

## 2018-07-23 DIAGNOSIS — R1311 Dysphagia, oral phase: Secondary | ICD-10-CM | POA: Diagnosis not present

## 2018-07-25 DIAGNOSIS — R293 Abnormal posture: Secondary | ICD-10-CM | POA: Diagnosis not present

## 2018-07-25 DIAGNOSIS — R29818 Other symptoms and signs involving the nervous system: Secondary | ICD-10-CM | POA: Diagnosis not present

## 2018-07-25 DIAGNOSIS — R41841 Cognitive communication deficit: Secondary | ICD-10-CM | POA: Diagnosis not present

## 2018-07-25 DIAGNOSIS — G809 Cerebral palsy, unspecified: Secondary | ICD-10-CM | POA: Diagnosis not present

## 2018-07-25 DIAGNOSIS — M25561 Pain in right knee: Secondary | ICD-10-CM | POA: Diagnosis not present

## 2018-07-25 DIAGNOSIS — R1311 Dysphagia, oral phase: Secondary | ICD-10-CM | POA: Diagnosis not present

## 2018-07-26 DIAGNOSIS — R1311 Dysphagia, oral phase: Secondary | ICD-10-CM | POA: Diagnosis not present

## 2018-07-26 DIAGNOSIS — R41841 Cognitive communication deficit: Secondary | ICD-10-CM | POA: Diagnosis not present

## 2018-07-26 DIAGNOSIS — R293 Abnormal posture: Secondary | ICD-10-CM | POA: Diagnosis not present

## 2018-07-26 DIAGNOSIS — M25561 Pain in right knee: Secondary | ICD-10-CM | POA: Diagnosis not present

## 2018-07-26 DIAGNOSIS — G809 Cerebral palsy, unspecified: Secondary | ICD-10-CM | POA: Diagnosis not present

## 2018-07-26 DIAGNOSIS — R29818 Other symptoms and signs involving the nervous system: Secondary | ICD-10-CM | POA: Diagnosis not present

## 2018-07-30 DIAGNOSIS — R293 Abnormal posture: Secondary | ICD-10-CM | POA: Diagnosis not present

## 2018-07-30 DIAGNOSIS — R29818 Other symptoms and signs involving the nervous system: Secondary | ICD-10-CM | POA: Diagnosis not present

## 2018-07-30 DIAGNOSIS — G809 Cerebral palsy, unspecified: Secondary | ICD-10-CM | POA: Diagnosis not present

## 2018-07-30 DIAGNOSIS — R1311 Dysphagia, oral phase: Secondary | ICD-10-CM | POA: Diagnosis not present

## 2018-07-30 DIAGNOSIS — M25561 Pain in right knee: Secondary | ICD-10-CM | POA: Diagnosis not present

## 2018-07-30 DIAGNOSIS — R41841 Cognitive communication deficit: Secondary | ICD-10-CM | POA: Diagnosis not present

## 2018-07-31 DIAGNOSIS — R29818 Other symptoms and signs involving the nervous system: Secondary | ICD-10-CM | POA: Diagnosis not present

## 2018-07-31 DIAGNOSIS — R41841 Cognitive communication deficit: Secondary | ICD-10-CM | POA: Diagnosis not present

## 2018-07-31 DIAGNOSIS — M25561 Pain in right knee: Secondary | ICD-10-CM | POA: Diagnosis not present

## 2018-07-31 DIAGNOSIS — R1311 Dysphagia, oral phase: Secondary | ICD-10-CM | POA: Diagnosis not present

## 2018-07-31 DIAGNOSIS — R1312 Dysphagia, oropharyngeal phase: Secondary | ICD-10-CM | POA: Diagnosis not present

## 2018-07-31 DIAGNOSIS — G809 Cerebral palsy, unspecified: Secondary | ICD-10-CM | POA: Diagnosis not present

## 2018-07-31 DIAGNOSIS — R293 Abnormal posture: Secondary | ICD-10-CM | POA: Diagnosis not present

## 2018-07-31 DIAGNOSIS — R278 Other lack of coordination: Secondary | ICD-10-CM | POA: Diagnosis not present

## 2018-08-01 DIAGNOSIS — G809 Cerebral palsy, unspecified: Secondary | ICD-10-CM | POA: Diagnosis not present

## 2018-08-01 DIAGNOSIS — R41841 Cognitive communication deficit: Secondary | ICD-10-CM | POA: Diagnosis not present

## 2018-08-01 DIAGNOSIS — M25561 Pain in right knee: Secondary | ICD-10-CM | POA: Diagnosis not present

## 2018-08-01 DIAGNOSIS — R1311 Dysphagia, oral phase: Secondary | ICD-10-CM | POA: Diagnosis not present

## 2018-08-01 DIAGNOSIS — R293 Abnormal posture: Secondary | ICD-10-CM | POA: Diagnosis not present

## 2018-08-01 DIAGNOSIS — R29818 Other symptoms and signs involving the nervous system: Secondary | ICD-10-CM | POA: Diagnosis not present

## 2018-08-02 DIAGNOSIS — G809 Cerebral palsy, unspecified: Secondary | ICD-10-CM | POA: Diagnosis not present

## 2018-08-02 DIAGNOSIS — R29818 Other symptoms and signs involving the nervous system: Secondary | ICD-10-CM | POA: Diagnosis not present

## 2018-08-02 DIAGNOSIS — R1311 Dysphagia, oral phase: Secondary | ICD-10-CM | POA: Diagnosis not present

## 2018-08-02 DIAGNOSIS — R293 Abnormal posture: Secondary | ICD-10-CM | POA: Diagnosis not present

## 2018-08-02 DIAGNOSIS — M25561 Pain in right knee: Secondary | ICD-10-CM | POA: Diagnosis not present

## 2018-08-02 DIAGNOSIS — R41841 Cognitive communication deficit: Secondary | ICD-10-CM | POA: Diagnosis not present

## 2018-08-05 DIAGNOSIS — M25561 Pain in right knee: Secondary | ICD-10-CM | POA: Diagnosis not present

## 2018-08-05 DIAGNOSIS — R29818 Other symptoms and signs involving the nervous system: Secondary | ICD-10-CM | POA: Diagnosis not present

## 2018-08-05 DIAGNOSIS — G809 Cerebral palsy, unspecified: Secondary | ICD-10-CM | POA: Diagnosis not present

## 2018-08-05 DIAGNOSIS — R41841 Cognitive communication deficit: Secondary | ICD-10-CM | POA: Diagnosis not present

## 2018-08-05 DIAGNOSIS — R1311 Dysphagia, oral phase: Secondary | ICD-10-CM | POA: Diagnosis not present

## 2018-08-05 DIAGNOSIS — R293 Abnormal posture: Secondary | ICD-10-CM | POA: Diagnosis not present

## 2018-08-06 ENCOUNTER — Encounter: Payer: Self-pay | Admitting: Internal Medicine

## 2018-08-06 ENCOUNTER — Non-Acute Institutional Stay: Payer: Medicare Other | Admitting: Internal Medicine

## 2018-08-06 VITALS — BP 106/60 | HR 64 | Resp 16 | Wt 112.8 lb

## 2018-08-06 DIAGNOSIS — R1311 Dysphagia, oral phase: Secondary | ICD-10-CM | POA: Diagnosis not present

## 2018-08-06 DIAGNOSIS — R29818 Other symptoms and signs involving the nervous system: Secondary | ICD-10-CM | POA: Diagnosis not present

## 2018-08-06 DIAGNOSIS — M25561 Pain in right knee: Secondary | ICD-10-CM | POA: Diagnosis not present

## 2018-08-06 DIAGNOSIS — Z515 Encounter for palliative care: Secondary | ICD-10-CM

## 2018-08-06 DIAGNOSIS — R293 Abnormal posture: Secondary | ICD-10-CM | POA: Diagnosis not present

## 2018-08-06 DIAGNOSIS — R41841 Cognitive communication deficit: Secondary | ICD-10-CM | POA: Diagnosis not present

## 2018-08-06 DIAGNOSIS — G809 Cerebral palsy, unspecified: Secondary | ICD-10-CM | POA: Diagnosis not present

## 2018-08-06 NOTE — Progress Notes (Signed)
[facility] Telephone: [phone] Fax: [phone]  PATIENT NAME: [name] DOB: Jul 24, 1936 MRN: [id number]  PRIMARY CARE PROVIDER:   Reynold Bowen, MD  REFERRING PROVIDER:  Reynold Bowen, MD [name], [name] [id number]  RESPONSIBLE PARTY:   [name] and [name] [phone] (cousins).  INTERVAL HISTORY / HISTORY OF PRESENT ILLNESS:  [name] is a 83 yo woman with medical h/o cerebral palsy, spastic paraplegia, and dysphagia. This is a routine follow up visit from 06/25/2018.   IMPRESSION / RECOMMENDATIONS:  1. Weakness - Patient without appreciable functional decline since last Palliative Care visit [facility] visit 6 weeks earlier. She is wheelchair bound, and is a non-weight bearing 2 person transfer. She is dependent for dressing and hygiene. She is incontinent of bowel and bladder. She is alert and oriented to person and place. Current weight is 112.8 lbs. Staff report no appreciable signs of weight loss. Her oral intake of 50-75% of meals. Patient denies pain or muscle spasms on current regimen. She continues to be followed by [name] (psych NP) for depression and anxiety; considering GDR of Celexia if mood remains stable. Patient expressed her sadness regarding the recent death of some of the residents with whom she was friends. Patient's cousin and [employer] visiting.    3.  F/U NP visit 2-3 months.  I spent 30  minutes providing this consultation (from 3:30pm to 4pm). More than 50% of the time in this consultation was spent coordinating communication.   CODE STATUS: DNR. MOST: Scope of medical care limited to comfort measures. Determine antibiotic use at time of infection. No IVFs. No feeding tube.  PPS: 30%  HOSPICE ELIGIBILITY/DIAGNOSIS: No, as prognosis thought to be greater than 6 months.  PAST MEDICAL HISTORY:  Past Medical History:  Diagnosis Date  . Cerebral palsy ([place])   . Constipation   . Hyperlipidemia      SOCIAL HX:  Social History   Tobacco Use  . Smoking status: Never Smoker  . Smokeless tobacco: Never Used  Substance Use Topics  . Alcohol use: No    ALLERGIES: No Known Allergies   PERTINENT MEDICATIONS:  Outpatient Encounter Medications as of 08/06/2018  Medication Sig  . albuterol (2.5 MG/3ML) 0.083% NEBU 3 mL, albuterol (5 MG/ML) 0.5% NEBU 0.5 mL Inhale 2.5 mg into the lungs every 6 (six) hours. Albuterol sul 2.5mg /80ml solution q6hr prn wheezing  . atorvastatin (LIPITOR) 10 MG tablet Take 10 mg by mouth daily.  . bisacodyl (DULCOLAX) 10 MG suppository Place 10 mg rectally every 3 (three) days.  . cholecalciferol (VITAMIN D) 1000 UNITS tablet Take 2,000 Units by mouth daily.  . citalopram (CELEXA) 10 MG tablet Take 20 mg by mouth daily. 1/2 tab (5mg ) po qd  . Cranberry 450 MG CAPS Take 1 capsule by mouth 2 (two) times daily.  . diazepam (VALIUM) 2 MG tablet Take 2 mg by mouth 2 (two) times daily. 1/2  tab (89mf) twice a day for muscle spasms  . diclofenac sodium (VOLTAREN) 1 % GEL Apply 2 g topically 3 (three) times daily as needed.  . docusate sodium (COLACE) 100 MG capsule Take 100 mg by mouth 2 (two) times daily.  . ferrous gluconate (FERGON) 324 MG tablet Take 324 mg by mouth every evening.  . fluticasone (FLONASE) 50 MCG/ACT nasal spray Place 1 spray into both nostrils daily.  Marland Kitchen guaifenesin (ROBITUSSIN) 100 MG/5ML syrup Take 200 mg by mouth 4 (four) times daily as needed for cough.  Marland Kitchen  HYDROcodone-acetaminophen (NORCO/VICODIN) 5-325 MG per tablet Take 1 tablet by mouth 2 (two) times daily.   Marland Kitchen ketoconazole (NIZORAL) 2 % cream Apply 1 application topically daily. Shampoo hair twice weekly for treatment of dry scalp  . loperamide (IMODIUM A-D) 2 MG tablet Take 2 mg by mouth 4 (four) times daily as needed for diarrhea or loose stools. Give 4mg  po prn loose stools. May repeat 2mg  po tid for additional loose stools  . ondansetron (ZOFRAN) 4 MG tablet Take 4 mg by mouth every 8 (eight)  hours as needed for nausea or vomiting.  . pantoprazole (PROTONIX) 40 MG tablet Take 1 tablet (40 mg total) by mouth 2 (two) times daily.  . polyethylene glycol (MIRALAX / GLYCOLAX) packet Take 17 g by mouth at bedtime.  . promethazine (PHENERGAN) 25 MG/ML injection Inject 25 mg into the muscle every 6 (six) hours as needed for nausea or vomiting. For nausea and vomiting  . saccharomyces boulardii (FLORASTOR) 250 MG capsule Take 250 mg by mouth 2 (two) times daily.  Marland Kitchen senna (SENOKOT) 8.6 MG tablet Take 1 tablet by mouth at bedtime.  . sucralfate (CARAFATE) 1 g tablet Take 1 g by mouth 2 (two) times daily.   No facility-administered encounter medications on file as of 08/06/2018.     PHYSICAL EXAM:  VS: 106/60, HR: 64, RR 16 General: NAD, frail appearing, slender, no acute distress. Pleasantly conversant with halting speech. Maintaining good eye contact.  Cardiovascular: regular rate and rhythm. Soft systolic murmur Pulmonary: clear ant fields Abdomen: soft, nontender, + bowel sounds GU: no suprapubic tenderness Extremities: no edema, no joint deformities Skin: no rashes Neurological: Weakness but otherwise nonfocal  Sharon Handler, NP

## 2018-08-07 DIAGNOSIS — R293 Abnormal posture: Secondary | ICD-10-CM | POA: Diagnosis not present

## 2018-08-07 DIAGNOSIS — R41841 Cognitive communication deficit: Secondary | ICD-10-CM | POA: Diagnosis not present

## 2018-08-07 DIAGNOSIS — F331 Major depressive disorder, recurrent, moderate: Secondary | ICD-10-CM | POA: Diagnosis not present

## 2018-08-07 DIAGNOSIS — M25561 Pain in right knee: Secondary | ICD-10-CM | POA: Diagnosis not present

## 2018-08-07 DIAGNOSIS — G809 Cerebral palsy, unspecified: Secondary | ICD-10-CM | POA: Diagnosis not present

## 2018-08-07 DIAGNOSIS — R1311 Dysphagia, oral phase: Secondary | ICD-10-CM | POA: Diagnosis not present

## 2018-08-07 DIAGNOSIS — R29818 Other symptoms and signs involving the nervous system: Secondary | ICD-10-CM | POA: Diagnosis not present

## 2018-08-07 DIAGNOSIS — F064 Anxiety disorder due to known physiological condition: Secondary | ICD-10-CM | POA: Diagnosis not present

## 2018-08-08 DIAGNOSIS — M25561 Pain in right knee: Secondary | ICD-10-CM | POA: Diagnosis not present

## 2018-08-08 DIAGNOSIS — K219 Gastro-esophageal reflux disease without esophagitis: Secondary | ICD-10-CM | POA: Diagnosis not present

## 2018-08-08 DIAGNOSIS — R29818 Other symptoms and signs involving the nervous system: Secondary | ICD-10-CM | POA: Diagnosis not present

## 2018-08-08 DIAGNOSIS — R41841 Cognitive communication deficit: Secondary | ICD-10-CM | POA: Diagnosis not present

## 2018-08-08 DIAGNOSIS — J069 Acute upper respiratory infection, unspecified: Secondary | ICD-10-CM | POA: Diagnosis not present

## 2018-08-08 DIAGNOSIS — G809 Cerebral palsy, unspecified: Secondary | ICD-10-CM | POA: Diagnosis not present

## 2018-08-08 DIAGNOSIS — R1311 Dysphagia, oral phase: Secondary | ICD-10-CM | POA: Diagnosis not present

## 2018-08-08 DIAGNOSIS — D509 Iron deficiency anemia, unspecified: Secondary | ICD-10-CM | POA: Diagnosis not present

## 2018-08-08 DIAGNOSIS — R293 Abnormal posture: Secondary | ICD-10-CM | POA: Diagnosis not present

## 2018-08-09 DIAGNOSIS — B351 Tinea unguium: Secondary | ICD-10-CM | POA: Diagnosis not present

## 2018-08-09 DIAGNOSIS — I739 Peripheral vascular disease, unspecified: Secondary | ICD-10-CM | POA: Diagnosis not present

## 2018-08-09 DIAGNOSIS — Q845 Enlarged and hypertrophic nails: Secondary | ICD-10-CM | POA: Diagnosis not present

## 2018-08-09 DIAGNOSIS — L603 Nail dystrophy: Secondary | ICD-10-CM | POA: Diagnosis not present

## 2018-08-21 DIAGNOSIS — F331 Major depressive disorder, recurrent, moderate: Secondary | ICD-10-CM | POA: Diagnosis not present

## 2018-08-21 DIAGNOSIS — F064 Anxiety disorder due to known physiological condition: Secondary | ICD-10-CM | POA: Diagnosis not present

## 2018-08-28 DIAGNOSIS — F329 Major depressive disorder, single episode, unspecified: Secondary | ICD-10-CM | POA: Diagnosis not present

## 2018-08-28 DIAGNOSIS — G809 Cerebral palsy, unspecified: Secondary | ICD-10-CM | POA: Diagnosis not present

## 2018-08-28 DIAGNOSIS — Z66 Do not resuscitate: Secondary | ICD-10-CM | POA: Diagnosis not present

## 2018-09-18 DIAGNOSIS — F331 Major depressive disorder, recurrent, moderate: Secondary | ICD-10-CM | POA: Diagnosis not present

## 2018-09-18 DIAGNOSIS — F064 Anxiety disorder due to known physiological condition: Secondary | ICD-10-CM | POA: Diagnosis not present

## 2018-09-26 ENCOUNTER — Non-Acute Institutional Stay: Payer: Medicare Other | Admitting: Internal Medicine

## 2018-09-26 ENCOUNTER — Encounter: Payer: Self-pay | Admitting: Internal Medicine

## 2018-09-26 DIAGNOSIS — Z515 Encounter for palliative care: Secondary | ICD-10-CM | POA: Diagnosis not present

## 2018-09-26 NOTE — Progress Notes (Signed)
Feb 27th, 2020 [place] Telephone: [phone] Fax: [phone]  PATIENT NAME:[name] DOB:[birth year] ZWC:[id number] Norva Karvonen 601-A  PRIMARY CARE PROVIDER:South, Annie Main, MD  REFERRING PROVIDER:South, Annie Main, MD [place], [place] [address]  RESPONSIBLE PARTY:Davidand [id number] [phone] (cousins).  IMPRESSION /RECOMMENDATIONS:  1. Weakness -Nursing staff report that patient is without appreciable functional decline since last Palliative Care visit [facility] visit 7 weeks earlier. She is wheelchair bound, and is a non-weight bearing 2 person transfer. She is dependent for dressing and hygiene. She is incontinent of bowel and bladder. She is pleasantly alert and oriented to person and place. Patient's current weight 112lbs (2/5/202), which is a loss of 0.8lbs. At a height of 5'6" her BMI is 18.1 kg/m2. Her oral intake of 50-75% of meals. Patient denies pain or muscle spasms on current regimen. She continues to be followed by Rocky Crafts (psych NP) for depression and anxiety. Bryson Ha trialed dose reduction of Celexa (for depression) but this was unsuccessful. Patient continues on valium for her restless leg syndrome, which is also benefiting her anxiety. Patient's cousin and Gayleen Orem has been visiting regularly.   3.  F/U NP visit 2-3 months.  I spent57minutes providing this consultation (from 3:30pmto 4pm). More than 50% of the time in this consultation was spent coordinating communication, chart review, and interview of staff.   INTERVAL HISTORY / HISTORY OF PRESENT ILLNESS:Ms. Broshears is a 83 yo woman with medical h/ocerebral palsy, spastic paraplegia, and dysphagia. This is a routine follow up visit from 08/07/2027.   CODE STATUS:DNR. MOST: Scope of medical care limited to comfort measures. Determine antibiotic use at time of infection. No IVFs. No feeding  tube.  PPS:30%  HOSPICE ELIGIBILITY/DIAGNOSIS:No, as prognosis thought to be greater than 6 months.  PAST MEDICAL HISTORY:  Past Medical History:  Diagnosis Date  . Cerebral palsy ([place])   . Constipation   . Hyperlipidemia     SOCIAL HX:  Social History   Tobacco Use  . Smoking status: Never Smoker  . Smokeless tobacco: Never Used  Substance Use Topics  . Alcohol use: No    ALLERGIES: No Known Allergies   PERTINENT MEDICATIONS:  Outpatient Encounter Medications as of 09/26/2018  Medication Sig  . albuterol (2.5 MG/3ML) 0.083% NEBU 3 mL, albuterol (5 MG/ML) 0.5% NEBU 0.5 mL Inhale 2.5 mg into the lungs every 6 (six) hours. Albuterol sul 2.5mg /66ml solution q6hr prn wheezing  . atorvastatin (LIPITOR) 10 MG tablet Take 10 mg by mouth daily.  . bisacodyl (DULCOLAX) 10 MG suppository Place 10 mg rectally every 3 (three) days.  . cholecalciferol (VITAMIN D) 1000 UNITS tablet Take 2,000 Units by mouth daily.  . citalopram (CELEXA) 10 MG tablet Take 20 mg by mouth daily. 1/2 tab (5mg ) po qd  . Cranberry 450 MG CAPS Take 1 capsule by mouth 2 (two) times daily.  . diazepam (VALIUM) 2 MG tablet Take 2 mg by mouth 2 (two) times daily. 1/2  tab (6mf) twice a day for muscle spasms  . diclofenac sodium (VOLTAREN) 1 % GEL Apply 2 g topically 3 (three) times daily as needed.  . docusate sodium (COLACE) 100 MG capsule Take 100 mg by mouth 2 (two) times daily.  . ferrous sulfate 220 (44 Fe) MG/5ML solution Take 220 mg by mouth at bedtime.  . fluticasone (FLONASE) 50 MCG/ACT nasal spray Place 1 spray into both nostrils daily.  Marland Kitchen guaifenesin (ROBITUSSIN) 100 MG/5ML syrup Take 200 mg by mouth  4 (four) times daily as needed for cough.  Marland Kitchen HYDROcodone-acetaminophen (NORCO/VICODIN) 5-325 MG per tablet Take 1 tablet by mouth 2 (two) times daily.   Marland Kitchen HYDROcodone-acetaminophen (NORCO/VICODIN) 5-325 MG tablet Take 1 tablet by mouth daily as needed for moderate pain. One tablet by mouth daily at lunch time  as needed for breakthrough pain  . ketoconazole (NIZORAL) 2 % cream Apply 1 application topically daily. Shampoo hair twice weekly for treatment of dry scalp  . ketotifen (ZADITOR) 0.025 % ophthalmic solution Place 1 drop into both eyes 2 (two) times daily as needed.  . loperamide (IMODIUM A-D) 2 MG tablet Take 2 mg by mouth 4 (four) times daily as needed for diarrhea or loose stools. Give 4mg  po prn loose stools. May repeat 2mg  po tid for additional loose stools  . ondansetron (ZOFRAN) 4 MG tablet Take 4 mg by mouth every 8 (eight) hours as needed for nausea or vomiting.  . pantoprazole (PROTONIX) 40 MG tablet Take 1 tablet (40 mg total) by mouth 2 (two) times daily.  . polyethylene glycol (MIRALAX / GLYCOLAX) packet Take 17 g by mouth at bedtime.  . promethazine (PHENERGAN) 25 MG/ML injection Inject 25 mg into the muscle every 6 (six) hours as needed for nausea or vomiting. For nausea and vomiting  . saccharomyces boulardii (FLORASTOR) 250 MG capsule Take 250 mg by mouth 2 (two) times daily.  Marland Kitchen senna (SENOKOT) 8.6 MG tablet Take 1 tablet by mouth at bedtime.  . sucralfate (CARAFATE) 1 g tablet Take 1 g by mouth 2 (two) times daily.  Marland Kitchen triamcinolone cream (KENALOG) 0.1 % Apply 1 application topically 2 (two) times daily.  . [DISCONTINUED] ferrous gluconate (FERGON) 324 MG tablet Take 324 mg by mouth every evening.   No facility-administered encounter medications on file as of 09/26/2018.     PHYSICAL EXAM:   Currently actively engaged in and enjoying facility activity (hitting balloon with pool noodle. She is alert, appears happy, and oriented.Pleasantly conversant with halting speech. Maintaining good eye contact. Cardiovascular: regular rate and rhythm. Soft systolic murmur Pulmonary: clear ant fields Abdomen: soft, nontender, + bowel sounds GU: no suprapubic tenderness Extremities: no edema, no joint deformities Skin: no rashes Neurological: Weakness but otherwise nonfocal  Julianne Handler, NP

## 2018-10-16 DIAGNOSIS — F331 Major depressive disorder, recurrent, moderate: Secondary | ICD-10-CM | POA: Diagnosis not present

## 2018-10-16 DIAGNOSIS — F064 Anxiety disorder due to known physiological condition: Secondary | ICD-10-CM | POA: Diagnosis not present

## 2018-10-23 DIAGNOSIS — K5900 Constipation, unspecified: Secondary | ICD-10-CM | POA: Diagnosis not present

## 2018-10-23 DIAGNOSIS — Z66 Do not resuscitate: Secondary | ICD-10-CM | POA: Diagnosis not present

## 2018-10-23 DIAGNOSIS — G809 Cerebral palsy, unspecified: Secondary | ICD-10-CM | POA: Diagnosis not present

## 2018-10-23 DIAGNOSIS — F329 Major depressive disorder, single episode, unspecified: Secondary | ICD-10-CM | POA: Diagnosis not present

## 2018-10-24 DIAGNOSIS — R1312 Dysphagia, oropharyngeal phase: Secondary | ICD-10-CM | POA: Diagnosis not present

## 2018-10-24 DIAGNOSIS — R1311 Dysphagia, oral phase: Secondary | ICD-10-CM | POA: Diagnosis not present

## 2018-10-24 DIAGNOSIS — G809 Cerebral palsy, unspecified: Secondary | ICD-10-CM | POA: Diagnosis not present

## 2018-10-24 DIAGNOSIS — R278 Other lack of coordination: Secondary | ICD-10-CM | POA: Diagnosis not present

## 2018-10-24 DIAGNOSIS — R293 Abnormal posture: Secondary | ICD-10-CM | POA: Diagnosis not present

## 2018-10-24 DIAGNOSIS — R41841 Cognitive communication deficit: Secondary | ICD-10-CM | POA: Diagnosis not present

## 2018-10-24 DIAGNOSIS — M25561 Pain in right knee: Secondary | ICD-10-CM | POA: Diagnosis not present

## 2018-10-24 DIAGNOSIS — R29818 Other symptoms and signs involving the nervous system: Secondary | ICD-10-CM | POA: Diagnosis not present

## 2018-10-25 DIAGNOSIS — M25561 Pain in right knee: Secondary | ICD-10-CM | POA: Diagnosis not present

## 2018-10-25 DIAGNOSIS — I5032 Chronic diastolic (congestive) heart failure: Secondary | ICD-10-CM | POA: Diagnosis not present

## 2018-10-25 DIAGNOSIS — G809 Cerebral palsy, unspecified: Secondary | ICD-10-CM | POA: Diagnosis not present

## 2018-10-25 DIAGNOSIS — E785 Hyperlipidemia, unspecified: Secondary | ICD-10-CM | POA: Diagnosis not present

## 2018-10-25 DIAGNOSIS — R29818 Other symptoms and signs involving the nervous system: Secondary | ICD-10-CM | POA: Diagnosis not present

## 2018-10-25 DIAGNOSIS — R41841 Cognitive communication deficit: Secondary | ICD-10-CM | POA: Diagnosis not present

## 2018-10-25 DIAGNOSIS — R293 Abnormal posture: Secondary | ICD-10-CM | POA: Diagnosis not present

## 2018-10-25 DIAGNOSIS — Z79899 Other long term (current) drug therapy: Secondary | ICD-10-CM | POA: Diagnosis not present

## 2018-10-25 DIAGNOSIS — D649 Anemia, unspecified: Secondary | ICD-10-CM | POA: Diagnosis not present

## 2018-10-25 DIAGNOSIS — D6489 Other specified anemias: Secondary | ICD-10-CM | POA: Diagnosis not present

## 2018-10-25 DIAGNOSIS — R1311 Dysphagia, oral phase: Secondary | ICD-10-CM | POA: Diagnosis not present

## 2018-10-28 DIAGNOSIS — R41841 Cognitive communication deficit: Secondary | ICD-10-CM | POA: Diagnosis not present

## 2018-10-28 DIAGNOSIS — R293 Abnormal posture: Secondary | ICD-10-CM | POA: Diagnosis not present

## 2018-10-28 DIAGNOSIS — R1311 Dysphagia, oral phase: Secondary | ICD-10-CM | POA: Diagnosis not present

## 2018-10-28 DIAGNOSIS — M25561 Pain in right knee: Secondary | ICD-10-CM | POA: Diagnosis not present

## 2018-10-28 DIAGNOSIS — G809 Cerebral palsy, unspecified: Secondary | ICD-10-CM | POA: Diagnosis not present

## 2018-10-28 DIAGNOSIS — R29818 Other symptoms and signs involving the nervous system: Secondary | ICD-10-CM | POA: Diagnosis not present

## 2018-10-29 DIAGNOSIS — R293 Abnormal posture: Secondary | ICD-10-CM | POA: Diagnosis not present

## 2018-10-29 DIAGNOSIS — G809 Cerebral palsy, unspecified: Secondary | ICD-10-CM | POA: Diagnosis not present

## 2018-10-29 DIAGNOSIS — R1311 Dysphagia, oral phase: Secondary | ICD-10-CM | POA: Diagnosis not present

## 2018-10-29 DIAGNOSIS — R29818 Other symptoms and signs involving the nervous system: Secondary | ICD-10-CM | POA: Diagnosis not present

## 2018-10-29 DIAGNOSIS — R41841 Cognitive communication deficit: Secondary | ICD-10-CM | POA: Diagnosis not present

## 2018-10-29 DIAGNOSIS — M25561 Pain in right knee: Secondary | ICD-10-CM | POA: Diagnosis not present

## 2018-10-30 DIAGNOSIS — G809 Cerebral palsy, unspecified: Secondary | ICD-10-CM | POA: Diagnosis not present

## 2018-10-30 DIAGNOSIS — F064 Anxiety disorder due to known physiological condition: Secondary | ICD-10-CM | POA: Diagnosis not present

## 2018-10-30 DIAGNOSIS — R41841 Cognitive communication deficit: Secondary | ICD-10-CM | POA: Diagnosis not present

## 2018-10-30 DIAGNOSIS — R29818 Other symptoms and signs involving the nervous system: Secondary | ICD-10-CM | POA: Diagnosis not present

## 2018-10-30 DIAGNOSIS — M25561 Pain in right knee: Secondary | ICD-10-CM | POA: Diagnosis not present

## 2018-10-30 DIAGNOSIS — R293 Abnormal posture: Secondary | ICD-10-CM | POA: Diagnosis not present

## 2018-10-30 DIAGNOSIS — R1312 Dysphagia, oropharyngeal phase: Secondary | ICD-10-CM | POA: Diagnosis not present

## 2018-10-30 DIAGNOSIS — F331 Major depressive disorder, recurrent, moderate: Secondary | ICD-10-CM | POA: Diagnosis not present

## 2018-10-30 DIAGNOSIS — R1311 Dysphagia, oral phase: Secondary | ICD-10-CM | POA: Diagnosis not present

## 2018-10-30 DIAGNOSIS — R278 Other lack of coordination: Secondary | ICD-10-CM | POA: Diagnosis not present

## 2018-10-31 DIAGNOSIS — G809 Cerebral palsy, unspecified: Secondary | ICD-10-CM | POA: Diagnosis not present

## 2018-10-31 DIAGNOSIS — R293 Abnormal posture: Secondary | ICD-10-CM | POA: Diagnosis not present

## 2018-10-31 DIAGNOSIS — M25561 Pain in right knee: Secondary | ICD-10-CM | POA: Diagnosis not present

## 2018-10-31 DIAGNOSIS — R1311 Dysphagia, oral phase: Secondary | ICD-10-CM | POA: Diagnosis not present

## 2018-10-31 DIAGNOSIS — R29818 Other symptoms and signs involving the nervous system: Secondary | ICD-10-CM | POA: Diagnosis not present

## 2018-10-31 DIAGNOSIS — R41841 Cognitive communication deficit: Secondary | ICD-10-CM | POA: Diagnosis not present

## 2018-11-01 DIAGNOSIS — G809 Cerebral palsy, unspecified: Secondary | ICD-10-CM | POA: Diagnosis not present

## 2018-11-01 DIAGNOSIS — R1311 Dysphagia, oral phase: Secondary | ICD-10-CM | POA: Diagnosis not present

## 2018-11-01 DIAGNOSIS — R293 Abnormal posture: Secondary | ICD-10-CM | POA: Diagnosis not present

## 2018-11-01 DIAGNOSIS — M25561 Pain in right knee: Secondary | ICD-10-CM | POA: Diagnosis not present

## 2018-11-01 DIAGNOSIS — R41841 Cognitive communication deficit: Secondary | ICD-10-CM | POA: Diagnosis not present

## 2018-11-01 DIAGNOSIS — R29818 Other symptoms and signs involving the nervous system: Secondary | ICD-10-CM | POA: Diagnosis not present

## 2018-11-04 DIAGNOSIS — G809 Cerebral palsy, unspecified: Secondary | ICD-10-CM | POA: Diagnosis not present

## 2018-11-04 DIAGNOSIS — R1311 Dysphagia, oral phase: Secondary | ICD-10-CM | POA: Diagnosis not present

## 2018-11-04 DIAGNOSIS — R293 Abnormal posture: Secondary | ICD-10-CM | POA: Diagnosis not present

## 2018-11-04 DIAGNOSIS — R41841 Cognitive communication deficit: Secondary | ICD-10-CM | POA: Diagnosis not present

## 2018-11-04 DIAGNOSIS — R29818 Other symptoms and signs involving the nervous system: Secondary | ICD-10-CM | POA: Diagnosis not present

## 2018-11-04 DIAGNOSIS — M25561 Pain in right knee: Secondary | ICD-10-CM | POA: Diagnosis not present

## 2018-11-05 DIAGNOSIS — R41841 Cognitive communication deficit: Secondary | ICD-10-CM | POA: Diagnosis not present

## 2018-11-05 DIAGNOSIS — R1311 Dysphagia, oral phase: Secondary | ICD-10-CM | POA: Diagnosis not present

## 2018-11-05 DIAGNOSIS — R293 Abnormal posture: Secondary | ICD-10-CM | POA: Diagnosis not present

## 2018-11-05 DIAGNOSIS — R29818 Other symptoms and signs involving the nervous system: Secondary | ICD-10-CM | POA: Diagnosis not present

## 2018-11-05 DIAGNOSIS — M25561 Pain in right knee: Secondary | ICD-10-CM | POA: Diagnosis not present

## 2018-11-05 DIAGNOSIS — G809 Cerebral palsy, unspecified: Secondary | ICD-10-CM | POA: Diagnosis not present

## 2018-11-06 DIAGNOSIS — R41841 Cognitive communication deficit: Secondary | ICD-10-CM | POA: Diagnosis not present

## 2018-11-06 DIAGNOSIS — G809 Cerebral palsy, unspecified: Secondary | ICD-10-CM | POA: Diagnosis not present

## 2018-11-06 DIAGNOSIS — R293 Abnormal posture: Secondary | ICD-10-CM | POA: Diagnosis not present

## 2018-11-06 DIAGNOSIS — M25561 Pain in right knee: Secondary | ICD-10-CM | POA: Diagnosis not present

## 2018-11-06 DIAGNOSIS — R29818 Other symptoms and signs involving the nervous system: Secondary | ICD-10-CM | POA: Diagnosis not present

## 2018-11-06 DIAGNOSIS — R1311 Dysphagia, oral phase: Secondary | ICD-10-CM | POA: Diagnosis not present

## 2018-11-13 DIAGNOSIS — F331 Major depressive disorder, recurrent, moderate: Secondary | ICD-10-CM | POA: Diagnosis not present

## 2018-11-13 DIAGNOSIS — F064 Anxiety disorder due to known physiological condition: Secondary | ICD-10-CM | POA: Diagnosis not present

## 2018-11-27 DIAGNOSIS — F064 Anxiety disorder due to known physiological condition: Secondary | ICD-10-CM | POA: Diagnosis not present

## 2018-11-27 DIAGNOSIS — F331 Major depressive disorder, recurrent, moderate: Secondary | ICD-10-CM | POA: Diagnosis not present

## 2018-12-11 DIAGNOSIS — F064 Anxiety disorder due to known physiological condition: Secondary | ICD-10-CM | POA: Diagnosis not present

## 2018-12-11 DIAGNOSIS — F331 Major depressive disorder, recurrent, moderate: Secondary | ICD-10-CM | POA: Diagnosis not present

## 2018-12-18 DIAGNOSIS — F329 Major depressive disorder, single episode, unspecified: Secondary | ICD-10-CM | POA: Diagnosis not present

## 2018-12-18 DIAGNOSIS — Z66 Do not resuscitate: Secondary | ICD-10-CM | POA: Diagnosis not present

## 2018-12-18 DIAGNOSIS — E785 Hyperlipidemia, unspecified: Secondary | ICD-10-CM | POA: Diagnosis not present

## 2018-12-18 DIAGNOSIS — G809 Cerebral palsy, unspecified: Secondary | ICD-10-CM | POA: Diagnosis not present

## 2018-12-18 DIAGNOSIS — K5900 Constipation, unspecified: Secondary | ICD-10-CM | POA: Diagnosis not present

## 2018-12-18 DIAGNOSIS — D649 Anemia, unspecified: Secondary | ICD-10-CM | POA: Diagnosis not present

## 2018-12-20 DIAGNOSIS — F419 Anxiety disorder, unspecified: Secondary | ICD-10-CM | POA: Diagnosis not present

## 2018-12-20 DIAGNOSIS — G809 Cerebral palsy, unspecified: Secondary | ICD-10-CM | POA: Diagnosis not present

## 2018-12-20 DIAGNOSIS — F329 Major depressive disorder, single episode, unspecified: Secondary | ICD-10-CM | POA: Diagnosis not present

## 2018-12-20 DIAGNOSIS — K219 Gastro-esophageal reflux disease without esophagitis: Secondary | ICD-10-CM | POA: Diagnosis not present

## 2019-01-08 DIAGNOSIS — F064 Anxiety disorder due to known physiological condition: Secondary | ICD-10-CM | POA: Diagnosis not present

## 2019-01-08 DIAGNOSIS — F331 Major depressive disorder, recurrent, moderate: Secondary | ICD-10-CM | POA: Diagnosis not present

## 2019-01-29 DIAGNOSIS — Z20828 Contact with and (suspected) exposure to other viral communicable diseases: Secondary | ICD-10-CM | POA: Diagnosis not present

## 2019-02-03 DIAGNOSIS — R1110 Vomiting, unspecified: Secondary | ICD-10-CM | POA: Diagnosis not present

## 2019-02-03 DIAGNOSIS — R05 Cough: Secondary | ICD-10-CM | POA: Diagnosis not present

## 2019-02-03 DIAGNOSIS — G809 Cerebral palsy, unspecified: Secondary | ICD-10-CM | POA: Diagnosis not present

## 2019-02-03 DIAGNOSIS — F419 Anxiety disorder, unspecified: Secondary | ICD-10-CM | POA: Diagnosis not present

## 2019-02-04 DIAGNOSIS — F329 Major depressive disorder, single episode, unspecified: Secondary | ICD-10-CM | POA: Diagnosis not present

## 2019-02-04 DIAGNOSIS — M810 Age-related osteoporosis without current pathological fracture: Secondary | ICD-10-CM | POA: Diagnosis not present

## 2019-02-04 DIAGNOSIS — R4181 Age-related cognitive decline: Secondary | ICD-10-CM | POA: Diagnosis not present

## 2019-02-04 DIAGNOSIS — J189 Pneumonia, unspecified organism: Secondary | ICD-10-CM | POA: Diagnosis not present

## 2019-02-04 DIAGNOSIS — F419 Anxiety disorder, unspecified: Secondary | ICD-10-CM | POA: Diagnosis not present

## 2019-02-04 DIAGNOSIS — I5032 Chronic diastolic (congestive) heart failure: Secondary | ICD-10-CM | POA: Diagnosis not present

## 2019-02-04 DIAGNOSIS — D649 Anemia, unspecified: Secondary | ICD-10-CM | POA: Diagnosis not present

## 2019-02-04 DIAGNOSIS — M25561 Pain in right knee: Secondary | ICD-10-CM | POA: Diagnosis not present

## 2019-02-04 DIAGNOSIS — R1311 Dysphagia, oral phase: Secondary | ICD-10-CM | POA: Diagnosis not present

## 2019-02-04 DIAGNOSIS — G809 Cerebral palsy, unspecified: Secondary | ICD-10-CM | POA: Diagnosis not present

## 2019-02-04 DIAGNOSIS — K219 Gastro-esophageal reflux disease without esophagitis: Secondary | ICD-10-CM | POA: Diagnosis not present

## 2019-02-04 DIAGNOSIS — R278 Other lack of coordination: Secondary | ICD-10-CM | POA: Diagnosis not present

## 2019-02-04 DIAGNOSIS — I9589 Other hypotension: Secondary | ICD-10-CM | POA: Diagnosis not present

## 2019-02-04 DIAGNOSIS — R293 Abnormal posture: Secondary | ICD-10-CM | POA: Diagnosis not present

## 2019-02-04 DIAGNOSIS — R1312 Dysphagia, oropharyngeal phase: Secondary | ICD-10-CM | POA: Diagnosis not present

## 2019-02-04 DIAGNOSIS — F331 Major depressive disorder, recurrent, moderate: Secondary | ICD-10-CM | POA: Diagnosis not present

## 2019-02-04 DIAGNOSIS — E785 Hyperlipidemia, unspecified: Secondary | ICD-10-CM | POA: Diagnosis not present

## 2019-02-04 DIAGNOSIS — R05 Cough: Secondary | ICD-10-CM | POA: Diagnosis not present

## 2019-02-04 DIAGNOSIS — R29818 Other symptoms and signs involving the nervous system: Secondary | ICD-10-CM | POA: Diagnosis not present

## 2019-02-04 DIAGNOSIS — Z20828 Contact with and (suspected) exposure to other viral communicable diseases: Secondary | ICD-10-CM | POA: Diagnosis not present

## 2019-02-04 DIAGNOSIS — R1110 Vomiting, unspecified: Secondary | ICD-10-CM | POA: Diagnosis not present

## 2019-02-05 DIAGNOSIS — J189 Pneumonia, unspecified organism: Secondary | ICD-10-CM | POA: Diagnosis not present

## 2019-02-05 DIAGNOSIS — G809 Cerebral palsy, unspecified: Secondary | ICD-10-CM | POA: Diagnosis not present

## 2019-02-05 DIAGNOSIS — F419 Anxiety disorder, unspecified: Secondary | ICD-10-CM | POA: Diagnosis not present

## 2019-02-05 DIAGNOSIS — F329 Major depressive disorder, single episode, unspecified: Secondary | ICD-10-CM | POA: Diagnosis not present

## 2019-02-06 DIAGNOSIS — J189 Pneumonia, unspecified organism: Secondary | ICD-10-CM | POA: Diagnosis not present

## 2019-02-06 DIAGNOSIS — F419 Anxiety disorder, unspecified: Secondary | ICD-10-CM | POA: Diagnosis not present

## 2019-02-06 DIAGNOSIS — R1110 Vomiting, unspecified: Secondary | ICD-10-CM | POA: Diagnosis not present

## 2019-02-06 DIAGNOSIS — G809 Cerebral palsy, unspecified: Secondary | ICD-10-CM | POA: Diagnosis not present

## 2019-02-07 DIAGNOSIS — F329 Major depressive disorder, single episode, unspecified: Secondary | ICD-10-CM | POA: Diagnosis not present

## 2019-02-07 DIAGNOSIS — G809 Cerebral palsy, unspecified: Secondary | ICD-10-CM | POA: Diagnosis not present

## 2019-02-07 DIAGNOSIS — J189 Pneumonia, unspecified organism: Secondary | ICD-10-CM | POA: Diagnosis not present

## 2019-02-07 DIAGNOSIS — I9589 Other hypotension: Secondary | ICD-10-CM | POA: Diagnosis not present

## 2019-02-10 DIAGNOSIS — I9589 Other hypotension: Secondary | ICD-10-CM | POA: Diagnosis not present

## 2019-02-10 DIAGNOSIS — F419 Anxiety disorder, unspecified: Secondary | ICD-10-CM | POA: Diagnosis not present

## 2019-02-10 DIAGNOSIS — J189 Pneumonia, unspecified organism: Secondary | ICD-10-CM | POA: Diagnosis not present

## 2019-02-10 DIAGNOSIS — R1110 Vomiting, unspecified: Secondary | ICD-10-CM | POA: Diagnosis not present

## 2019-02-10 DIAGNOSIS — G809 Cerebral palsy, unspecified: Secondary | ICD-10-CM | POA: Diagnosis not present

## 2019-02-11 DIAGNOSIS — F419 Anxiety disorder, unspecified: Secondary | ICD-10-CM | POA: Diagnosis not present

## 2019-02-11 DIAGNOSIS — J189 Pneumonia, unspecified organism: Secondary | ICD-10-CM | POA: Diagnosis not present

## 2019-02-11 DIAGNOSIS — R1110 Vomiting, unspecified: Secondary | ICD-10-CM | POA: Diagnosis not present

## 2019-02-11 DIAGNOSIS — G809 Cerebral palsy, unspecified: Secondary | ICD-10-CM | POA: Diagnosis not present

## 2019-02-12 DIAGNOSIS — F329 Major depressive disorder, single episode, unspecified: Secondary | ICD-10-CM | POA: Diagnosis not present

## 2019-02-12 DIAGNOSIS — J189 Pneumonia, unspecified organism: Secondary | ICD-10-CM | POA: Diagnosis not present

## 2019-02-12 DIAGNOSIS — G809 Cerebral palsy, unspecified: Secondary | ICD-10-CM | POA: Diagnosis not present

## 2019-02-12 DIAGNOSIS — K219 Gastro-esophageal reflux disease without esophagitis: Secondary | ICD-10-CM | POA: Diagnosis not present

## 2019-02-13 DIAGNOSIS — J189 Pneumonia, unspecified organism: Secondary | ICD-10-CM | POA: Diagnosis not present

## 2019-02-13 DIAGNOSIS — F329 Major depressive disorder, single episode, unspecified: Secondary | ICD-10-CM | POA: Diagnosis not present

## 2019-02-13 DIAGNOSIS — G809 Cerebral palsy, unspecified: Secondary | ICD-10-CM | POA: Diagnosis not present

## 2019-02-13 DIAGNOSIS — K219 Gastro-esophageal reflux disease without esophagitis: Secondary | ICD-10-CM | POA: Diagnosis not present

## 2019-02-19 DIAGNOSIS — F331 Major depressive disorder, recurrent, moderate: Secondary | ICD-10-CM | POA: Diagnosis not present

## 2019-02-19 DIAGNOSIS — F419 Anxiety disorder, unspecified: Secondary | ICD-10-CM | POA: Diagnosis not present

## 2019-02-19 DIAGNOSIS — R4181 Age-related cognitive decline: Secondary | ICD-10-CM | POA: Diagnosis not present

## 2019-02-27 DIAGNOSIS — F329 Major depressive disorder, single episode, unspecified: Secondary | ICD-10-CM | POA: Diagnosis not present

## 2019-02-27 DIAGNOSIS — G809 Cerebral palsy, unspecified: Secondary | ICD-10-CM | POA: Diagnosis not present

## 2019-02-27 DIAGNOSIS — K5900 Constipation, unspecified: Secondary | ICD-10-CM | POA: Diagnosis not present

## 2019-02-27 DIAGNOSIS — D72829 Elevated white blood cell count, unspecified: Secondary | ICD-10-CM | POA: Diagnosis not present

## 2019-02-27 DIAGNOSIS — Z66 Do not resuscitate: Secondary | ICD-10-CM | POA: Diagnosis not present

## 2019-03-03 DIAGNOSIS — G809 Cerebral palsy, unspecified: Secondary | ICD-10-CM | POA: Diagnosis not present

## 2019-03-03 DIAGNOSIS — R293 Abnormal posture: Secondary | ICD-10-CM | POA: Diagnosis not present

## 2019-03-03 DIAGNOSIS — R1311 Dysphagia, oral phase: Secondary | ICD-10-CM | POA: Diagnosis not present

## 2019-03-03 DIAGNOSIS — R29818 Other symptoms and signs involving the nervous system: Secondary | ICD-10-CM | POA: Diagnosis not present

## 2019-03-03 DIAGNOSIS — R1312 Dysphagia, oropharyngeal phase: Secondary | ICD-10-CM | POA: Diagnosis not present

## 2019-03-03 DIAGNOSIS — M25561 Pain in right knee: Secondary | ICD-10-CM | POA: Diagnosis not present

## 2019-03-03 DIAGNOSIS — R278 Other lack of coordination: Secondary | ICD-10-CM | POA: Diagnosis not present

## 2019-03-03 DIAGNOSIS — J189 Pneumonia, unspecified organism: Secondary | ICD-10-CM | POA: Diagnosis not present

## 2019-03-04 DIAGNOSIS — J189 Pneumonia, unspecified organism: Secondary | ICD-10-CM | POA: Diagnosis not present

## 2019-03-04 DIAGNOSIS — M25561 Pain in right knee: Secondary | ICD-10-CM | POA: Diagnosis not present

## 2019-03-04 DIAGNOSIS — G809 Cerebral palsy, unspecified: Secondary | ICD-10-CM | POA: Diagnosis not present

## 2019-03-04 DIAGNOSIS — R293 Abnormal posture: Secondary | ICD-10-CM | POA: Diagnosis not present

## 2019-03-04 DIAGNOSIS — R1311 Dysphagia, oral phase: Secondary | ICD-10-CM | POA: Diagnosis not present

## 2019-03-04 DIAGNOSIS — R29818 Other symptoms and signs involving the nervous system: Secondary | ICD-10-CM | POA: Diagnosis not present

## 2019-03-05 DIAGNOSIS — G809 Cerebral palsy, unspecified: Secondary | ICD-10-CM | POA: Diagnosis not present

## 2019-03-05 DIAGNOSIS — R1311 Dysphagia, oral phase: Secondary | ICD-10-CM | POA: Diagnosis not present

## 2019-03-05 DIAGNOSIS — J189 Pneumonia, unspecified organism: Secondary | ICD-10-CM | POA: Diagnosis not present

## 2019-03-05 DIAGNOSIS — R293 Abnormal posture: Secondary | ICD-10-CM | POA: Diagnosis not present

## 2019-03-05 DIAGNOSIS — R29818 Other symptoms and signs involving the nervous system: Secondary | ICD-10-CM | POA: Diagnosis not present

## 2019-03-05 DIAGNOSIS — M25561 Pain in right knee: Secondary | ICD-10-CM | POA: Diagnosis not present

## 2019-03-06 DIAGNOSIS — J189 Pneumonia, unspecified organism: Secondary | ICD-10-CM | POA: Diagnosis not present

## 2019-03-06 DIAGNOSIS — R1311 Dysphagia, oral phase: Secondary | ICD-10-CM | POA: Diagnosis not present

## 2019-03-06 DIAGNOSIS — R293 Abnormal posture: Secondary | ICD-10-CM | POA: Diagnosis not present

## 2019-03-06 DIAGNOSIS — G809 Cerebral palsy, unspecified: Secondary | ICD-10-CM | POA: Diagnosis not present

## 2019-03-06 DIAGNOSIS — M25561 Pain in right knee: Secondary | ICD-10-CM | POA: Diagnosis not present

## 2019-03-06 DIAGNOSIS — R29818 Other symptoms and signs involving the nervous system: Secondary | ICD-10-CM | POA: Diagnosis not present

## 2019-03-07 DIAGNOSIS — J189 Pneumonia, unspecified organism: Secondary | ICD-10-CM | POA: Diagnosis not present

## 2019-03-07 DIAGNOSIS — R1311 Dysphagia, oral phase: Secondary | ICD-10-CM | POA: Diagnosis not present

## 2019-03-07 DIAGNOSIS — G809 Cerebral palsy, unspecified: Secondary | ICD-10-CM | POA: Diagnosis not present

## 2019-03-07 DIAGNOSIS — R29818 Other symptoms and signs involving the nervous system: Secondary | ICD-10-CM | POA: Diagnosis not present

## 2019-03-07 DIAGNOSIS — R293 Abnormal posture: Secondary | ICD-10-CM | POA: Diagnosis not present

## 2019-03-07 DIAGNOSIS — M25561 Pain in right knee: Secondary | ICD-10-CM | POA: Diagnosis not present

## 2019-03-10 DIAGNOSIS — R1311 Dysphagia, oral phase: Secondary | ICD-10-CM | POA: Diagnosis not present

## 2019-03-10 DIAGNOSIS — R29818 Other symptoms and signs involving the nervous system: Secondary | ICD-10-CM | POA: Diagnosis not present

## 2019-03-10 DIAGNOSIS — G809 Cerebral palsy, unspecified: Secondary | ICD-10-CM | POA: Diagnosis not present

## 2019-03-10 DIAGNOSIS — J189 Pneumonia, unspecified organism: Secondary | ICD-10-CM | POA: Diagnosis not present

## 2019-03-10 DIAGNOSIS — R293 Abnormal posture: Secondary | ICD-10-CM | POA: Diagnosis not present

## 2019-03-10 DIAGNOSIS — M25561 Pain in right knee: Secondary | ICD-10-CM | POA: Diagnosis not present

## 2019-03-11 DIAGNOSIS — R4181 Age-related cognitive decline: Secondary | ICD-10-CM | POA: Diagnosis not present

## 2019-03-11 DIAGNOSIS — F419 Anxiety disorder, unspecified: Secondary | ICD-10-CM | POA: Diagnosis not present

## 2019-03-11 DIAGNOSIS — J189 Pneumonia, unspecified organism: Secondary | ICD-10-CM | POA: Diagnosis not present

## 2019-03-11 DIAGNOSIS — G809 Cerebral palsy, unspecified: Secondary | ICD-10-CM | POA: Diagnosis not present

## 2019-03-11 DIAGNOSIS — R293 Abnormal posture: Secondary | ICD-10-CM | POA: Diagnosis not present

## 2019-03-11 DIAGNOSIS — M25561 Pain in right knee: Secondary | ICD-10-CM | POA: Diagnosis not present

## 2019-03-11 DIAGNOSIS — R1311 Dysphagia, oral phase: Secondary | ICD-10-CM | POA: Diagnosis not present

## 2019-03-11 DIAGNOSIS — R29818 Other symptoms and signs involving the nervous system: Secondary | ICD-10-CM | POA: Diagnosis not present

## 2019-03-11 DIAGNOSIS — F331 Major depressive disorder, recurrent, moderate: Secondary | ICD-10-CM | POA: Diagnosis not present

## 2019-03-12 DIAGNOSIS — R293 Abnormal posture: Secondary | ICD-10-CM | POA: Diagnosis not present

## 2019-03-12 DIAGNOSIS — G809 Cerebral palsy, unspecified: Secondary | ICD-10-CM | POA: Diagnosis not present

## 2019-03-12 DIAGNOSIS — R29818 Other symptoms and signs involving the nervous system: Secondary | ICD-10-CM | POA: Diagnosis not present

## 2019-03-12 DIAGNOSIS — M25561 Pain in right knee: Secondary | ICD-10-CM | POA: Diagnosis not present

## 2019-03-12 DIAGNOSIS — J189 Pneumonia, unspecified organism: Secondary | ICD-10-CM | POA: Diagnosis not present

## 2019-03-12 DIAGNOSIS — R1311 Dysphagia, oral phase: Secondary | ICD-10-CM | POA: Diagnosis not present

## 2019-03-13 DIAGNOSIS — R293 Abnormal posture: Secondary | ICD-10-CM | POA: Diagnosis not present

## 2019-03-13 DIAGNOSIS — G809 Cerebral palsy, unspecified: Secondary | ICD-10-CM | POA: Diagnosis not present

## 2019-03-13 DIAGNOSIS — R1311 Dysphagia, oral phase: Secondary | ICD-10-CM | POA: Diagnosis not present

## 2019-03-13 DIAGNOSIS — J189 Pneumonia, unspecified organism: Secondary | ICD-10-CM | POA: Diagnosis not present

## 2019-03-13 DIAGNOSIS — R29818 Other symptoms and signs involving the nervous system: Secondary | ICD-10-CM | POA: Diagnosis not present

## 2019-03-13 DIAGNOSIS — M25561 Pain in right knee: Secondary | ICD-10-CM | POA: Diagnosis not present

## 2019-03-14 DIAGNOSIS — I10 Essential (primary) hypertension: Secondary | ICD-10-CM | POA: Diagnosis not present

## 2019-03-14 DIAGNOSIS — Z79899 Other long term (current) drug therapy: Secondary | ICD-10-CM | POA: Diagnosis not present

## 2019-03-14 DIAGNOSIS — E039 Hypothyroidism, unspecified: Secondary | ICD-10-CM | POA: Diagnosis not present

## 2019-03-14 DIAGNOSIS — E785 Hyperlipidemia, unspecified: Secondary | ICD-10-CM | POA: Diagnosis not present

## 2019-03-14 DIAGNOSIS — D649 Anemia, unspecified: Secondary | ICD-10-CM | POA: Diagnosis not present

## 2019-03-14 DIAGNOSIS — E559 Vitamin D deficiency, unspecified: Secondary | ICD-10-CM | POA: Diagnosis not present

## 2019-03-17 DIAGNOSIS — M25561 Pain in right knee: Secondary | ICD-10-CM | POA: Diagnosis not present

## 2019-03-17 DIAGNOSIS — R293 Abnormal posture: Secondary | ICD-10-CM | POA: Diagnosis not present

## 2019-03-17 DIAGNOSIS — G809 Cerebral palsy, unspecified: Secondary | ICD-10-CM | POA: Diagnosis not present

## 2019-03-17 DIAGNOSIS — R1311 Dysphagia, oral phase: Secondary | ICD-10-CM | POA: Diagnosis not present

## 2019-03-17 DIAGNOSIS — J189 Pneumonia, unspecified organism: Secondary | ICD-10-CM | POA: Diagnosis not present

## 2019-03-17 DIAGNOSIS — R29818 Other symptoms and signs involving the nervous system: Secondary | ICD-10-CM | POA: Diagnosis not present

## 2019-03-19 DIAGNOSIS — R29818 Other symptoms and signs involving the nervous system: Secondary | ICD-10-CM | POA: Diagnosis not present

## 2019-03-19 DIAGNOSIS — R1311 Dysphagia, oral phase: Secondary | ICD-10-CM | POA: Diagnosis not present

## 2019-03-19 DIAGNOSIS — M25561 Pain in right knee: Secondary | ICD-10-CM | POA: Diagnosis not present

## 2019-03-19 DIAGNOSIS — R293 Abnormal posture: Secondary | ICD-10-CM | POA: Diagnosis not present

## 2019-03-19 DIAGNOSIS — J189 Pneumonia, unspecified organism: Secondary | ICD-10-CM | POA: Diagnosis not present

## 2019-03-19 DIAGNOSIS — G809 Cerebral palsy, unspecified: Secondary | ICD-10-CM | POA: Diagnosis not present

## 2019-03-20 DIAGNOSIS — G809 Cerebral palsy, unspecified: Secondary | ICD-10-CM | POA: Diagnosis not present

## 2019-03-20 DIAGNOSIS — J189 Pneumonia, unspecified organism: Secondary | ICD-10-CM | POA: Diagnosis not present

## 2019-03-20 DIAGNOSIS — R29818 Other symptoms and signs involving the nervous system: Secondary | ICD-10-CM | POA: Diagnosis not present

## 2019-03-20 DIAGNOSIS — M25561 Pain in right knee: Secondary | ICD-10-CM | POA: Diagnosis not present

## 2019-03-20 DIAGNOSIS — R1311 Dysphagia, oral phase: Secondary | ICD-10-CM | POA: Diagnosis not present

## 2019-03-20 DIAGNOSIS — R293 Abnormal posture: Secondary | ICD-10-CM | POA: Diagnosis not present

## 2019-03-21 DIAGNOSIS — J189 Pneumonia, unspecified organism: Secondary | ICD-10-CM | POA: Diagnosis not present

## 2019-03-21 DIAGNOSIS — R1311 Dysphagia, oral phase: Secondary | ICD-10-CM | POA: Diagnosis not present

## 2019-03-21 DIAGNOSIS — G809 Cerebral palsy, unspecified: Secondary | ICD-10-CM | POA: Diagnosis not present

## 2019-03-21 DIAGNOSIS — R293 Abnormal posture: Secondary | ICD-10-CM | POA: Diagnosis not present

## 2019-03-21 DIAGNOSIS — M25561 Pain in right knee: Secondary | ICD-10-CM | POA: Diagnosis not present

## 2019-03-21 DIAGNOSIS — R29818 Other symptoms and signs involving the nervous system: Secondary | ICD-10-CM | POA: Diagnosis not present

## 2019-03-22 DIAGNOSIS — G809 Cerebral palsy, unspecified: Secondary | ICD-10-CM | POA: Diagnosis not present

## 2019-03-22 DIAGNOSIS — R29818 Other symptoms and signs involving the nervous system: Secondary | ICD-10-CM | POA: Diagnosis not present

## 2019-03-22 DIAGNOSIS — R1311 Dysphagia, oral phase: Secondary | ICD-10-CM | POA: Diagnosis not present

## 2019-03-22 DIAGNOSIS — M25561 Pain in right knee: Secondary | ICD-10-CM | POA: Diagnosis not present

## 2019-03-22 DIAGNOSIS — R293 Abnormal posture: Secondary | ICD-10-CM | POA: Diagnosis not present

## 2019-03-22 DIAGNOSIS — J189 Pneumonia, unspecified organism: Secondary | ICD-10-CM | POA: Diagnosis not present

## 2019-03-24 DIAGNOSIS — R293 Abnormal posture: Secondary | ICD-10-CM | POA: Diagnosis not present

## 2019-03-24 DIAGNOSIS — R29818 Other symptoms and signs involving the nervous system: Secondary | ICD-10-CM | POA: Diagnosis not present

## 2019-03-24 DIAGNOSIS — R1311 Dysphagia, oral phase: Secondary | ICD-10-CM | POA: Diagnosis not present

## 2019-03-24 DIAGNOSIS — M25561 Pain in right knee: Secondary | ICD-10-CM | POA: Diagnosis not present

## 2019-03-24 DIAGNOSIS — G809 Cerebral palsy, unspecified: Secondary | ICD-10-CM | POA: Diagnosis not present

## 2019-03-24 DIAGNOSIS — J189 Pneumonia, unspecified organism: Secondary | ICD-10-CM | POA: Diagnosis not present

## 2019-03-25 DIAGNOSIS — R293 Abnormal posture: Secondary | ICD-10-CM | POA: Diagnosis not present

## 2019-03-25 DIAGNOSIS — R29818 Other symptoms and signs involving the nervous system: Secondary | ICD-10-CM | POA: Diagnosis not present

## 2019-03-25 DIAGNOSIS — M25561 Pain in right knee: Secondary | ICD-10-CM | POA: Diagnosis not present

## 2019-03-25 DIAGNOSIS — G809 Cerebral palsy, unspecified: Secondary | ICD-10-CM | POA: Diagnosis not present

## 2019-03-25 DIAGNOSIS — R1311 Dysphagia, oral phase: Secondary | ICD-10-CM | POA: Diagnosis not present

## 2019-03-25 DIAGNOSIS — J189 Pneumonia, unspecified organism: Secondary | ICD-10-CM | POA: Diagnosis not present

## 2019-03-26 DIAGNOSIS — K219 Gastro-esophageal reflux disease without esophagitis: Secondary | ICD-10-CM | POA: Diagnosis not present

## 2019-03-26 DIAGNOSIS — R293 Abnormal posture: Secondary | ICD-10-CM | POA: Diagnosis not present

## 2019-03-26 DIAGNOSIS — F329 Major depressive disorder, single episode, unspecified: Secondary | ICD-10-CM | POA: Diagnosis not present

## 2019-03-26 DIAGNOSIS — G809 Cerebral palsy, unspecified: Secondary | ICD-10-CM | POA: Diagnosis not present

## 2019-03-26 DIAGNOSIS — R29818 Other symptoms and signs involving the nervous system: Secondary | ICD-10-CM | POA: Diagnosis not present

## 2019-03-26 DIAGNOSIS — R1311 Dysphagia, oral phase: Secondary | ICD-10-CM | POA: Diagnosis not present

## 2019-03-26 DIAGNOSIS — M25561 Pain in right knee: Secondary | ICD-10-CM | POA: Diagnosis not present

## 2019-03-26 DIAGNOSIS — J189 Pneumonia, unspecified organism: Secondary | ICD-10-CM | POA: Diagnosis not present

## 2019-03-26 DIAGNOSIS — F419 Anxiety disorder, unspecified: Secondary | ICD-10-CM | POA: Diagnosis not present

## 2019-03-27 DIAGNOSIS — R293 Abnormal posture: Secondary | ICD-10-CM | POA: Diagnosis not present

## 2019-03-27 DIAGNOSIS — J189 Pneumonia, unspecified organism: Secondary | ICD-10-CM | POA: Diagnosis not present

## 2019-03-27 DIAGNOSIS — M25561 Pain in right knee: Secondary | ICD-10-CM | POA: Diagnosis not present

## 2019-03-27 DIAGNOSIS — R29818 Other symptoms and signs involving the nervous system: Secondary | ICD-10-CM | POA: Diagnosis not present

## 2019-03-27 DIAGNOSIS — G809 Cerebral palsy, unspecified: Secondary | ICD-10-CM | POA: Diagnosis not present

## 2019-03-27 DIAGNOSIS — R1311 Dysphagia, oral phase: Secondary | ICD-10-CM | POA: Diagnosis not present

## 2019-04-08 DIAGNOSIS — F419 Anxiety disorder, unspecified: Secondary | ICD-10-CM | POA: Diagnosis not present

## 2019-04-08 DIAGNOSIS — R451 Restlessness and agitation: Secondary | ICD-10-CM | POA: Diagnosis not present

## 2019-04-08 DIAGNOSIS — G809 Cerebral palsy, unspecified: Secondary | ICD-10-CM | POA: Diagnosis not present

## 2019-04-08 DIAGNOSIS — R319 Hematuria, unspecified: Secondary | ICD-10-CM | POA: Diagnosis not present

## 2019-04-08 DIAGNOSIS — Z79899 Other long term (current) drug therapy: Secondary | ICD-10-CM | POA: Diagnosis not present

## 2019-04-08 DIAGNOSIS — F331 Major depressive disorder, recurrent, moderate: Secondary | ICD-10-CM | POA: Diagnosis not present

## 2019-04-08 DIAGNOSIS — F064 Anxiety disorder due to known physiological condition: Secondary | ICD-10-CM | POA: Diagnosis not present

## 2019-04-08 DIAGNOSIS — F329 Major depressive disorder, single episode, unspecified: Secondary | ICD-10-CM | POA: Diagnosis not present

## 2019-04-08 DIAGNOSIS — N390 Urinary tract infection, site not specified: Secondary | ICD-10-CM | POA: Diagnosis not present

## 2019-04-09 DIAGNOSIS — Q845 Enlarged and hypertrophic nails: Secondary | ICD-10-CM | POA: Diagnosis not present

## 2019-04-09 DIAGNOSIS — B351 Tinea unguium: Secondary | ICD-10-CM | POA: Diagnosis not present

## 2019-04-09 DIAGNOSIS — I739 Peripheral vascular disease, unspecified: Secondary | ICD-10-CM | POA: Diagnosis not present

## 2019-04-10 DIAGNOSIS — N390 Urinary tract infection, site not specified: Secondary | ICD-10-CM | POA: Diagnosis not present

## 2019-04-10 DIAGNOSIS — R451 Restlessness and agitation: Secondary | ICD-10-CM | POA: Diagnosis not present

## 2019-04-10 DIAGNOSIS — F419 Anxiety disorder, unspecified: Secondary | ICD-10-CM | POA: Diagnosis not present

## 2019-04-10 DIAGNOSIS — G809 Cerebral palsy, unspecified: Secondary | ICD-10-CM | POA: Diagnosis not present

## 2019-04-23 DIAGNOSIS — G809 Cerebral palsy, unspecified: Secondary | ICD-10-CM | POA: Diagnosis not present

## 2019-04-23 DIAGNOSIS — D72829 Elevated white blood cell count, unspecified: Secondary | ICD-10-CM | POA: Diagnosis not present

## 2019-04-23 DIAGNOSIS — Z66 Do not resuscitate: Secondary | ICD-10-CM | POA: Diagnosis not present

## 2019-04-23 DIAGNOSIS — F329 Major depressive disorder, single episode, unspecified: Secondary | ICD-10-CM | POA: Diagnosis not present

## 2019-04-23 DIAGNOSIS — K5900 Constipation, unspecified: Secondary | ICD-10-CM | POA: Diagnosis not present

## 2019-05-01 DIAGNOSIS — Z20828 Contact with and (suspected) exposure to other viral communicable diseases: Secondary | ICD-10-CM | POA: Diagnosis not present

## 2019-05-02 DIAGNOSIS — F329 Major depressive disorder, single episode, unspecified: Secondary | ICD-10-CM | POA: Diagnosis not present

## 2019-05-02 DIAGNOSIS — K219 Gastro-esophageal reflux disease without esophagitis: Secondary | ICD-10-CM | POA: Diagnosis not present

## 2019-05-02 DIAGNOSIS — G809 Cerebral palsy, unspecified: Secondary | ICD-10-CM | POA: Diagnosis not present

## 2019-05-02 DIAGNOSIS — F419 Anxiety disorder, unspecified: Secondary | ICD-10-CM | POA: Diagnosis not present

## 2019-05-06 DIAGNOSIS — F064 Anxiety disorder due to known physiological condition: Secondary | ICD-10-CM | POA: Diagnosis not present

## 2019-05-06 DIAGNOSIS — F331 Major depressive disorder, recurrent, moderate: Secondary | ICD-10-CM | POA: Diagnosis not present

## 2019-05-07 DIAGNOSIS — Z20828 Contact with and (suspected) exposure to other viral communicable diseases: Secondary | ICD-10-CM | POA: Diagnosis not present

## 2019-05-20 DIAGNOSIS — Z20828 Contact with and (suspected) exposure to other viral communicable diseases: Secondary | ICD-10-CM | POA: Diagnosis not present

## 2019-05-21 DIAGNOSIS — R293 Abnormal posture: Secondary | ICD-10-CM | POA: Diagnosis not present

## 2019-05-21 DIAGNOSIS — R278 Other lack of coordination: Secondary | ICD-10-CM | POA: Diagnosis not present

## 2019-05-21 DIAGNOSIS — M25561 Pain in right knee: Secondary | ICD-10-CM | POA: Diagnosis not present

## 2019-05-21 DIAGNOSIS — J189 Pneumonia, unspecified organism: Secondary | ICD-10-CM | POA: Diagnosis not present

## 2019-05-21 DIAGNOSIS — R1312 Dysphagia, oropharyngeal phase: Secondary | ICD-10-CM | POA: Diagnosis not present

## 2019-05-21 DIAGNOSIS — G809 Cerebral palsy, unspecified: Secondary | ICD-10-CM | POA: Diagnosis not present

## 2019-05-21 DIAGNOSIS — R29818 Other symptoms and signs involving the nervous system: Secondary | ICD-10-CM | POA: Diagnosis not present

## 2019-05-21 DIAGNOSIS — R1311 Dysphagia, oral phase: Secondary | ICD-10-CM | POA: Diagnosis not present

## 2019-05-22 DIAGNOSIS — R293 Abnormal posture: Secondary | ICD-10-CM | POA: Diagnosis not present

## 2019-05-22 DIAGNOSIS — J189 Pneumonia, unspecified organism: Secondary | ICD-10-CM | POA: Diagnosis not present

## 2019-05-22 DIAGNOSIS — R1311 Dysphagia, oral phase: Secondary | ICD-10-CM | POA: Diagnosis not present

## 2019-05-22 DIAGNOSIS — G809 Cerebral palsy, unspecified: Secondary | ICD-10-CM | POA: Diagnosis not present

## 2019-05-22 DIAGNOSIS — R29818 Other symptoms and signs involving the nervous system: Secondary | ICD-10-CM | POA: Diagnosis not present

## 2019-05-22 DIAGNOSIS — M25561 Pain in right knee: Secondary | ICD-10-CM | POA: Diagnosis not present

## 2019-05-23 DIAGNOSIS — M25561 Pain in right knee: Secondary | ICD-10-CM | POA: Diagnosis not present

## 2019-05-23 DIAGNOSIS — G809 Cerebral palsy, unspecified: Secondary | ICD-10-CM | POA: Diagnosis not present

## 2019-05-23 DIAGNOSIS — R1311 Dysphagia, oral phase: Secondary | ICD-10-CM | POA: Diagnosis not present

## 2019-05-23 DIAGNOSIS — R293 Abnormal posture: Secondary | ICD-10-CM | POA: Diagnosis not present

## 2019-05-23 DIAGNOSIS — J189 Pneumonia, unspecified organism: Secondary | ICD-10-CM | POA: Diagnosis not present

## 2019-05-23 DIAGNOSIS — R29818 Other symptoms and signs involving the nervous system: Secondary | ICD-10-CM | POA: Diagnosis not present

## 2019-05-26 DIAGNOSIS — M25561 Pain in right knee: Secondary | ICD-10-CM | POA: Diagnosis not present

## 2019-05-26 DIAGNOSIS — G809 Cerebral palsy, unspecified: Secondary | ICD-10-CM | POA: Diagnosis not present

## 2019-05-26 DIAGNOSIS — R293 Abnormal posture: Secondary | ICD-10-CM | POA: Diagnosis not present

## 2019-05-26 DIAGNOSIS — R1311 Dysphagia, oral phase: Secondary | ICD-10-CM | POA: Diagnosis not present

## 2019-05-26 DIAGNOSIS — J189 Pneumonia, unspecified organism: Secondary | ICD-10-CM | POA: Diagnosis not present

## 2019-05-26 DIAGNOSIS — R29818 Other symptoms and signs involving the nervous system: Secondary | ICD-10-CM | POA: Diagnosis not present

## 2019-05-27 DIAGNOSIS — G809 Cerebral palsy, unspecified: Secondary | ICD-10-CM | POA: Diagnosis not present

## 2019-05-27 DIAGNOSIS — R1311 Dysphagia, oral phase: Secondary | ICD-10-CM | POA: Diagnosis not present

## 2019-05-27 DIAGNOSIS — R319 Hematuria, unspecified: Secondary | ICD-10-CM | POA: Diagnosis not present

## 2019-05-27 DIAGNOSIS — F419 Anxiety disorder, unspecified: Secondary | ICD-10-CM | POA: Diagnosis not present

## 2019-05-27 DIAGNOSIS — R451 Restlessness and agitation: Secondary | ICD-10-CM | POA: Diagnosis not present

## 2019-05-27 DIAGNOSIS — R29818 Other symptoms and signs involving the nervous system: Secondary | ICD-10-CM | POA: Diagnosis not present

## 2019-05-27 DIAGNOSIS — M25561 Pain in right knee: Secondary | ICD-10-CM | POA: Diagnosis not present

## 2019-05-27 DIAGNOSIS — N390 Urinary tract infection, site not specified: Secondary | ICD-10-CM | POA: Diagnosis not present

## 2019-05-27 DIAGNOSIS — R293 Abnormal posture: Secondary | ICD-10-CM | POA: Diagnosis not present

## 2019-05-27 DIAGNOSIS — Z20828 Contact with and (suspected) exposure to other viral communicable diseases: Secondary | ICD-10-CM | POA: Diagnosis not present

## 2019-05-27 DIAGNOSIS — F329 Major depressive disorder, single episode, unspecified: Secondary | ICD-10-CM | POA: Diagnosis not present

## 2019-05-27 DIAGNOSIS — J189 Pneumonia, unspecified organism: Secondary | ICD-10-CM | POA: Diagnosis not present

## 2019-05-28 DIAGNOSIS — R29818 Other symptoms and signs involving the nervous system: Secondary | ICD-10-CM | POA: Diagnosis not present

## 2019-05-28 DIAGNOSIS — R1311 Dysphagia, oral phase: Secondary | ICD-10-CM | POA: Diagnosis not present

## 2019-05-28 DIAGNOSIS — J189 Pneumonia, unspecified organism: Secondary | ICD-10-CM | POA: Diagnosis not present

## 2019-05-28 DIAGNOSIS — G809 Cerebral palsy, unspecified: Secondary | ICD-10-CM | POA: Diagnosis not present

## 2019-05-28 DIAGNOSIS — M25561 Pain in right knee: Secondary | ICD-10-CM | POA: Diagnosis not present

## 2019-05-28 DIAGNOSIS — R293 Abnormal posture: Secondary | ICD-10-CM | POA: Diagnosis not present

## 2019-05-29 DIAGNOSIS — G809 Cerebral palsy, unspecified: Secondary | ICD-10-CM | POA: Diagnosis not present

## 2019-05-29 DIAGNOSIS — R293 Abnormal posture: Secondary | ICD-10-CM | POA: Diagnosis not present

## 2019-05-29 DIAGNOSIS — R29818 Other symptoms and signs involving the nervous system: Secondary | ICD-10-CM | POA: Diagnosis not present

## 2019-05-29 DIAGNOSIS — J189 Pneumonia, unspecified organism: Secondary | ICD-10-CM | POA: Diagnosis not present

## 2019-05-29 DIAGNOSIS — R1311 Dysphagia, oral phase: Secondary | ICD-10-CM | POA: Diagnosis not present

## 2019-05-29 DIAGNOSIS — M25561 Pain in right knee: Secondary | ICD-10-CM | POA: Diagnosis not present

## 2019-05-30 DIAGNOSIS — R29818 Other symptoms and signs involving the nervous system: Secondary | ICD-10-CM | POA: Diagnosis not present

## 2019-05-30 DIAGNOSIS — R293 Abnormal posture: Secondary | ICD-10-CM | POA: Diagnosis not present

## 2019-05-30 DIAGNOSIS — R1311 Dysphagia, oral phase: Secondary | ICD-10-CM | POA: Diagnosis not present

## 2019-05-30 DIAGNOSIS — G809 Cerebral palsy, unspecified: Secondary | ICD-10-CM | POA: Diagnosis not present

## 2019-05-30 DIAGNOSIS — M25561 Pain in right knee: Secondary | ICD-10-CM | POA: Diagnosis not present

## 2019-05-30 DIAGNOSIS — J189 Pneumonia, unspecified organism: Secondary | ICD-10-CM | POA: Diagnosis not present

## 2019-06-02 DIAGNOSIS — R1312 Dysphagia, oropharyngeal phase: Secondary | ICD-10-CM | POA: Diagnosis not present

## 2019-06-02 DIAGNOSIS — R293 Abnormal posture: Secondary | ICD-10-CM | POA: Diagnosis not present

## 2019-06-02 DIAGNOSIS — G809 Cerebral palsy, unspecified: Secondary | ICD-10-CM | POA: Diagnosis not present

## 2019-06-02 DIAGNOSIS — R1311 Dysphagia, oral phase: Secondary | ICD-10-CM | POA: Diagnosis not present

## 2019-06-02 DIAGNOSIS — J189 Pneumonia, unspecified organism: Secondary | ICD-10-CM | POA: Diagnosis not present

## 2019-06-02 DIAGNOSIS — R278 Other lack of coordination: Secondary | ICD-10-CM | POA: Diagnosis not present

## 2019-06-02 DIAGNOSIS — M25561 Pain in right knee: Secondary | ICD-10-CM | POA: Diagnosis not present

## 2019-06-02 DIAGNOSIS — R29818 Other symptoms and signs involving the nervous system: Secondary | ICD-10-CM | POA: Diagnosis not present

## 2019-06-03 DIAGNOSIS — F064 Anxiety disorder due to known physiological condition: Secondary | ICD-10-CM | POA: Diagnosis not present

## 2019-06-03 DIAGNOSIS — R1311 Dysphagia, oral phase: Secondary | ICD-10-CM | POA: Diagnosis not present

## 2019-06-03 DIAGNOSIS — G809 Cerebral palsy, unspecified: Secondary | ICD-10-CM | POA: Diagnosis not present

## 2019-06-03 DIAGNOSIS — R293 Abnormal posture: Secondary | ICD-10-CM | POA: Diagnosis not present

## 2019-06-03 DIAGNOSIS — R29818 Other symptoms and signs involving the nervous system: Secondary | ICD-10-CM | POA: Diagnosis not present

## 2019-06-03 DIAGNOSIS — F331 Major depressive disorder, recurrent, moderate: Secondary | ICD-10-CM | POA: Diagnosis not present

## 2019-06-03 DIAGNOSIS — J189 Pneumonia, unspecified organism: Secondary | ICD-10-CM | POA: Diagnosis not present

## 2019-06-03 DIAGNOSIS — M25561 Pain in right knee: Secondary | ICD-10-CM | POA: Diagnosis not present

## 2019-06-03 DIAGNOSIS — Z20828 Contact with and (suspected) exposure to other viral communicable diseases: Secondary | ICD-10-CM | POA: Diagnosis not present

## 2019-06-04 DIAGNOSIS — R293 Abnormal posture: Secondary | ICD-10-CM | POA: Diagnosis not present

## 2019-06-04 DIAGNOSIS — G809 Cerebral palsy, unspecified: Secondary | ICD-10-CM | POA: Diagnosis not present

## 2019-06-04 DIAGNOSIS — J189 Pneumonia, unspecified organism: Secondary | ICD-10-CM | POA: Diagnosis not present

## 2019-06-04 DIAGNOSIS — R1311 Dysphagia, oral phase: Secondary | ICD-10-CM | POA: Diagnosis not present

## 2019-06-04 DIAGNOSIS — R29818 Other symptoms and signs involving the nervous system: Secondary | ICD-10-CM | POA: Diagnosis not present

## 2019-06-04 DIAGNOSIS — M25561 Pain in right knee: Secondary | ICD-10-CM | POA: Diagnosis not present

## 2019-06-05 DIAGNOSIS — R29818 Other symptoms and signs involving the nervous system: Secondary | ICD-10-CM | POA: Diagnosis not present

## 2019-06-05 DIAGNOSIS — R4182 Altered mental status, unspecified: Secondary | ICD-10-CM | POA: Diagnosis not present

## 2019-06-05 DIAGNOSIS — R293 Abnormal posture: Secondary | ICD-10-CM | POA: Diagnosis not present

## 2019-06-05 DIAGNOSIS — F419 Anxiety disorder, unspecified: Secondary | ICD-10-CM | POA: Diagnosis not present

## 2019-06-05 DIAGNOSIS — R1311 Dysphagia, oral phase: Secondary | ICD-10-CM | POA: Diagnosis not present

## 2019-06-05 DIAGNOSIS — J189 Pneumonia, unspecified organism: Secondary | ICD-10-CM | POA: Diagnosis not present

## 2019-06-05 DIAGNOSIS — G809 Cerebral palsy, unspecified: Secondary | ICD-10-CM | POA: Diagnosis not present

## 2019-06-05 DIAGNOSIS — R451 Restlessness and agitation: Secondary | ICD-10-CM | POA: Diagnosis not present

## 2019-06-05 DIAGNOSIS — M25561 Pain in right knee: Secondary | ICD-10-CM | POA: Diagnosis not present

## 2019-06-06 DIAGNOSIS — D649 Anemia, unspecified: Secondary | ICD-10-CM | POA: Diagnosis not present

## 2019-06-06 DIAGNOSIS — G809 Cerebral palsy, unspecified: Secondary | ICD-10-CM | POA: Diagnosis not present

## 2019-06-06 DIAGNOSIS — I10 Essential (primary) hypertension: Secondary | ICD-10-CM | POA: Diagnosis not present

## 2019-06-06 DIAGNOSIS — R293 Abnormal posture: Secondary | ICD-10-CM | POA: Diagnosis not present

## 2019-06-06 DIAGNOSIS — R29818 Other symptoms and signs involving the nervous system: Secondary | ICD-10-CM | POA: Diagnosis not present

## 2019-06-06 DIAGNOSIS — J189 Pneumonia, unspecified organism: Secondary | ICD-10-CM | POA: Diagnosis not present

## 2019-06-06 DIAGNOSIS — M25561 Pain in right knee: Secondary | ICD-10-CM | POA: Diagnosis not present

## 2019-06-06 DIAGNOSIS — R319 Hematuria, unspecified: Secondary | ICD-10-CM | POA: Diagnosis not present

## 2019-06-06 DIAGNOSIS — E785 Hyperlipidemia, unspecified: Secondary | ICD-10-CM | POA: Diagnosis not present

## 2019-06-06 DIAGNOSIS — N390 Urinary tract infection, site not specified: Secondary | ICD-10-CM | POA: Diagnosis not present

## 2019-06-06 DIAGNOSIS — R1311 Dysphagia, oral phase: Secondary | ICD-10-CM | POA: Diagnosis not present

## 2019-06-09 DIAGNOSIS — G809 Cerebral palsy, unspecified: Secondary | ICD-10-CM | POA: Diagnosis not present

## 2019-06-09 DIAGNOSIS — M25561 Pain in right knee: Secondary | ICD-10-CM | POA: Diagnosis not present

## 2019-06-09 DIAGNOSIS — R1311 Dysphagia, oral phase: Secondary | ICD-10-CM | POA: Diagnosis not present

## 2019-06-09 DIAGNOSIS — R293 Abnormal posture: Secondary | ICD-10-CM | POA: Diagnosis not present

## 2019-06-09 DIAGNOSIS — J189 Pneumonia, unspecified organism: Secondary | ICD-10-CM | POA: Diagnosis not present

## 2019-06-09 DIAGNOSIS — R29818 Other symptoms and signs involving the nervous system: Secondary | ICD-10-CM | POA: Diagnosis not present

## 2019-06-10 DIAGNOSIS — N390 Urinary tract infection, site not specified: Secondary | ICD-10-CM | POA: Diagnosis not present

## 2019-06-10 DIAGNOSIS — F419 Anxiety disorder, unspecified: Secondary | ICD-10-CM | POA: Diagnosis not present

## 2019-06-10 DIAGNOSIS — M25561 Pain in right knee: Secondary | ICD-10-CM | POA: Diagnosis not present

## 2019-06-10 DIAGNOSIS — R1311 Dysphagia, oral phase: Secondary | ICD-10-CM | POA: Diagnosis not present

## 2019-06-10 DIAGNOSIS — R4182 Altered mental status, unspecified: Secondary | ICD-10-CM | POA: Diagnosis not present

## 2019-06-10 DIAGNOSIS — G809 Cerebral palsy, unspecified: Secondary | ICD-10-CM | POA: Diagnosis not present

## 2019-06-10 DIAGNOSIS — J189 Pneumonia, unspecified organism: Secondary | ICD-10-CM | POA: Diagnosis not present

## 2019-06-10 DIAGNOSIS — R293 Abnormal posture: Secondary | ICD-10-CM | POA: Diagnosis not present

## 2019-06-10 DIAGNOSIS — R29818 Other symptoms and signs involving the nervous system: Secondary | ICD-10-CM | POA: Diagnosis not present

## 2019-06-10 DIAGNOSIS — Z20828 Contact with and (suspected) exposure to other viral communicable diseases: Secondary | ICD-10-CM | POA: Diagnosis not present

## 2019-06-11 DIAGNOSIS — M25561 Pain in right knee: Secondary | ICD-10-CM | POA: Diagnosis not present

## 2019-06-11 DIAGNOSIS — J189 Pneumonia, unspecified organism: Secondary | ICD-10-CM | POA: Diagnosis not present

## 2019-06-11 DIAGNOSIS — R29818 Other symptoms and signs involving the nervous system: Secondary | ICD-10-CM | POA: Diagnosis not present

## 2019-06-11 DIAGNOSIS — R293 Abnormal posture: Secondary | ICD-10-CM | POA: Diagnosis not present

## 2019-06-11 DIAGNOSIS — R1311 Dysphagia, oral phase: Secondary | ICD-10-CM | POA: Diagnosis not present

## 2019-06-11 DIAGNOSIS — G809 Cerebral palsy, unspecified: Secondary | ICD-10-CM | POA: Diagnosis not present

## 2019-06-12 DIAGNOSIS — R293 Abnormal posture: Secondary | ICD-10-CM | POA: Diagnosis not present

## 2019-06-12 DIAGNOSIS — R1311 Dysphagia, oral phase: Secondary | ICD-10-CM | POA: Diagnosis not present

## 2019-06-12 DIAGNOSIS — J189 Pneumonia, unspecified organism: Secondary | ICD-10-CM | POA: Diagnosis not present

## 2019-06-12 DIAGNOSIS — R29818 Other symptoms and signs involving the nervous system: Secondary | ICD-10-CM | POA: Diagnosis not present

## 2019-06-12 DIAGNOSIS — M25561 Pain in right knee: Secondary | ICD-10-CM | POA: Diagnosis not present

## 2019-06-12 DIAGNOSIS — G809 Cerebral palsy, unspecified: Secondary | ICD-10-CM | POA: Diagnosis not present

## 2019-06-13 DIAGNOSIS — J189 Pneumonia, unspecified organism: Secondary | ICD-10-CM | POA: Diagnosis not present

## 2019-06-13 DIAGNOSIS — G809 Cerebral palsy, unspecified: Secondary | ICD-10-CM | POA: Diagnosis not present

## 2019-06-13 DIAGNOSIS — M25561 Pain in right knee: Secondary | ICD-10-CM | POA: Diagnosis not present

## 2019-06-13 DIAGNOSIS — R293 Abnormal posture: Secondary | ICD-10-CM | POA: Diagnosis not present

## 2019-06-13 DIAGNOSIS — R29818 Other symptoms and signs involving the nervous system: Secondary | ICD-10-CM | POA: Diagnosis not present

## 2019-06-13 DIAGNOSIS — R1311 Dysphagia, oral phase: Secondary | ICD-10-CM | POA: Diagnosis not present

## 2019-06-16 DIAGNOSIS — R293 Abnormal posture: Secondary | ICD-10-CM | POA: Diagnosis not present

## 2019-06-16 DIAGNOSIS — R29818 Other symptoms and signs involving the nervous system: Secondary | ICD-10-CM | POA: Diagnosis not present

## 2019-06-16 DIAGNOSIS — J189 Pneumonia, unspecified organism: Secondary | ICD-10-CM | POA: Diagnosis not present

## 2019-06-16 DIAGNOSIS — M25561 Pain in right knee: Secondary | ICD-10-CM | POA: Diagnosis not present

## 2019-06-16 DIAGNOSIS — R1311 Dysphagia, oral phase: Secondary | ICD-10-CM | POA: Diagnosis not present

## 2019-06-16 DIAGNOSIS — G809 Cerebral palsy, unspecified: Secondary | ICD-10-CM | POA: Diagnosis not present

## 2019-06-17 DIAGNOSIS — R29818 Other symptoms and signs involving the nervous system: Secondary | ICD-10-CM | POA: Diagnosis not present

## 2019-06-17 DIAGNOSIS — R293 Abnormal posture: Secondary | ICD-10-CM | POA: Diagnosis not present

## 2019-06-17 DIAGNOSIS — G809 Cerebral palsy, unspecified: Secondary | ICD-10-CM | POA: Diagnosis not present

## 2019-06-17 DIAGNOSIS — J189 Pneumonia, unspecified organism: Secondary | ICD-10-CM | POA: Diagnosis not present

## 2019-06-17 DIAGNOSIS — F331 Major depressive disorder, recurrent, moderate: Secondary | ICD-10-CM | POA: Diagnosis not present

## 2019-06-17 DIAGNOSIS — Z20828 Contact with and (suspected) exposure to other viral communicable diseases: Secondary | ICD-10-CM | POA: Diagnosis not present

## 2019-06-17 DIAGNOSIS — F064 Anxiety disorder due to known physiological condition: Secondary | ICD-10-CM | POA: Diagnosis not present

## 2019-06-17 DIAGNOSIS — M25561 Pain in right knee: Secondary | ICD-10-CM | POA: Diagnosis not present

## 2019-06-17 DIAGNOSIS — R1311 Dysphagia, oral phase: Secondary | ICD-10-CM | POA: Diagnosis not present

## 2019-06-17 DIAGNOSIS — F411 Generalized anxiety disorder: Secondary | ICD-10-CM | POA: Diagnosis not present

## 2019-06-18 DIAGNOSIS — J189 Pneumonia, unspecified organism: Secondary | ICD-10-CM | POA: Diagnosis not present

## 2019-06-18 DIAGNOSIS — G809 Cerebral palsy, unspecified: Secondary | ICD-10-CM | POA: Diagnosis not present

## 2019-06-18 DIAGNOSIS — R29818 Other symptoms and signs involving the nervous system: Secondary | ICD-10-CM | POA: Diagnosis not present

## 2019-06-18 DIAGNOSIS — R293 Abnormal posture: Secondary | ICD-10-CM | POA: Diagnosis not present

## 2019-06-18 DIAGNOSIS — Z66 Do not resuscitate: Secondary | ICD-10-CM | POA: Diagnosis not present

## 2019-06-18 DIAGNOSIS — M25561 Pain in right knee: Secondary | ICD-10-CM | POA: Diagnosis not present

## 2019-06-18 DIAGNOSIS — K5900 Constipation, unspecified: Secondary | ICD-10-CM | POA: Diagnosis not present

## 2019-06-18 DIAGNOSIS — F329 Major depressive disorder, single episode, unspecified: Secondary | ICD-10-CM | POA: Diagnosis not present

## 2019-06-18 DIAGNOSIS — R1311 Dysphagia, oral phase: Secondary | ICD-10-CM | POA: Diagnosis not present

## 2019-06-19 DIAGNOSIS — M25561 Pain in right knee: Secondary | ICD-10-CM | POA: Diagnosis not present

## 2019-06-19 DIAGNOSIS — R293 Abnormal posture: Secondary | ICD-10-CM | POA: Diagnosis not present

## 2019-06-19 DIAGNOSIS — R29818 Other symptoms and signs involving the nervous system: Secondary | ICD-10-CM | POA: Diagnosis not present

## 2019-06-19 DIAGNOSIS — G809 Cerebral palsy, unspecified: Secondary | ICD-10-CM | POA: Diagnosis not present

## 2019-06-19 DIAGNOSIS — J189 Pneumonia, unspecified organism: Secondary | ICD-10-CM | POA: Diagnosis not present

## 2019-06-19 DIAGNOSIS — R1311 Dysphagia, oral phase: Secondary | ICD-10-CM | POA: Diagnosis not present

## 2019-06-20 DIAGNOSIS — R29818 Other symptoms and signs involving the nervous system: Secondary | ICD-10-CM | POA: Diagnosis not present

## 2019-06-20 DIAGNOSIS — M25561 Pain in right knee: Secondary | ICD-10-CM | POA: Diagnosis not present

## 2019-06-20 DIAGNOSIS — J189 Pneumonia, unspecified organism: Secondary | ICD-10-CM | POA: Diagnosis not present

## 2019-06-20 DIAGNOSIS — R1311 Dysphagia, oral phase: Secondary | ICD-10-CM | POA: Diagnosis not present

## 2019-06-20 DIAGNOSIS — G809 Cerebral palsy, unspecified: Secondary | ICD-10-CM | POA: Diagnosis not present

## 2019-06-20 DIAGNOSIS — R293 Abnormal posture: Secondary | ICD-10-CM | POA: Diagnosis not present

## 2019-06-23 ENCOUNTER — Non-Acute Institutional Stay: Payer: Medicare Other | Admitting: Internal Medicine

## 2019-06-23 ENCOUNTER — Other Ambulatory Visit: Payer: Self-pay

## 2019-06-23 DIAGNOSIS — Z515 Encounter for palliative care: Secondary | ICD-10-CM

## 2019-06-23 DIAGNOSIS — M25561 Pain in right knee: Secondary | ICD-10-CM | POA: Diagnosis not present

## 2019-06-23 DIAGNOSIS — F339 Major depressive disorder, recurrent, unspecified: Secondary | ICD-10-CM

## 2019-06-23 DIAGNOSIS — J189 Pneumonia, unspecified organism: Secondary | ICD-10-CM | POA: Diagnosis not present

## 2019-06-23 DIAGNOSIS — R1311 Dysphagia, oral phase: Secondary | ICD-10-CM | POA: Diagnosis not present

## 2019-06-23 DIAGNOSIS — R293 Abnormal posture: Secondary | ICD-10-CM | POA: Diagnosis not present

## 2019-06-23 DIAGNOSIS — G809 Cerebral palsy, unspecified: Secondary | ICD-10-CM | POA: Diagnosis not present

## 2019-06-23 DIAGNOSIS — R29818 Other symptoms and signs involving the nervous system: Secondary | ICD-10-CM | POA: Diagnosis not present

## 2019-06-24 ENCOUNTER — Encounter: Payer: Self-pay | Admitting: Internal Medicine

## 2019-06-24 DIAGNOSIS — R1311 Dysphagia, oral phase: Secondary | ICD-10-CM | POA: Diagnosis not present

## 2019-06-24 DIAGNOSIS — R293 Abnormal posture: Secondary | ICD-10-CM | POA: Diagnosis not present

## 2019-06-24 DIAGNOSIS — G809 Cerebral palsy, unspecified: Secondary | ICD-10-CM | POA: Diagnosis not present

## 2019-06-24 DIAGNOSIS — M25561 Pain in right knee: Secondary | ICD-10-CM | POA: Diagnosis not present

## 2019-06-24 DIAGNOSIS — R29818 Other symptoms and signs involving the nervous system: Secondary | ICD-10-CM | POA: Diagnosis not present

## 2019-06-24 DIAGNOSIS — J189 Pneumonia, unspecified organism: Secondary | ICD-10-CM | POA: Diagnosis not present

## 2019-06-24 NOTE — Progress Notes (Signed)
Nov 23rd, 2020 [place] Telephone: [phone] Fax: [phone]  to the current COVID-19 infection/crises, the patient and family prefer, and have given their verbal consent for, a provider visit via telehealth, from my office. HIPPA policies of confidentially were discussed.   PATIENT NAME: [name] DOB: 02-11-1936 MRN: [id number] [name] Rm 601-A   PRIMARY CARE PROVIDER:   Dr Delfina Redwood   REFERRING PROVIDER:  Dr. Delfina Redwood   RESPONSIBLE PARTY:   [name] and [name] [phone] (cousins).    IMPRESSION / RECOMMENDATIONS:  1. Weakness - Patient remains wheelchair bound, and is a non-weight bearing 2-person transfer. She is dependent for dressing and hygiene. She is incontinent of bowel and bladder. She is pleasantly alert and oriented to person and place. Her most recent weight (05/07/2019) was 105lbs, which reflects a decline of 7lbs (loss of 6.25% of body weight) over those previous 5 months. At a height of 5'6" her BMI is 16.9 kg/m2. Patient mentions the food is not to her taste. She c/o weekly feeling of malaise and stomach "sickness". No emesis but sometimes associated nausea that can last a few hours. Associated fatigue. She has PRN Phenergan a Zofran available.   -consider addition of Remeron 15mg  for appetite stimulation.  2. Patient denies pain or muscle spasms on current regimen. She continues to be followed by Psych NP for depression and anxiety. Patient currently on Celexa 40mg  qd, and buspirone 15mg  tid, that was started in Aug. Patient continues on valium for her restless leg syndrome, which is also benefiting her anxiety. Patient's cousin and [name] has been visiting regularly.    3. F/U NP visit 2-3 months. I will call cousin [name] with updates.   I spent 30 minutes providing this consultation (from 3:30pm to 4pm). More than 50% of the time in this consultation was spent coordinating communication, chart review, and  interview of staff.    INTERVAL HISTORY / HISTORY OF PRESENT ILLNESS:  [name] is a 83 yo woman with medical h/o cerebral palsy, spastic paraplegia, and dysphagia. This is a routine follow up visit from 09/26/2018.    CODE STATUS: DNR. MOST: Scope of medical care limited to comfort measures. Determine antibiotic use at time of infection. No IVFs. No feeding tube.   PPS: 30%   HOSPICE ELIGIBILITY/DIAGNOSIS: TBD  PAST MEDICAL HISTORY:  Past Medical History:  Diagnosis Date  . Cerebral palsy ([place])   . Constipation   . Hyperlipidemia     SOCIAL HX:  Social History   Tobacco Use  . Smoking status: Never Smoker  . Smokeless tobacco: Never Used  Substance Use Topics  . Alcohol use: No    ALLERGIES: No Known Allergies   PERTINENT MEDICATIONS:  Outpatient Encounter Medications as of 06/23/2019  Medication Sig  . albuterol (2.5 MG/3ML) 0.083% NEBU 3 mL, albuterol (5 MG/ML) 0.5% NEBU 0.5 mL Inhale 2.5 mg into the lungs every 6 (six) hours. Albuterol sul 2.5mg /39ml solution q6hr prn wheezing  . atorvastatin (LIPITOR) 10 MG tablet Take 10 mg by mouth daily.  . bisacodyl (DULCOLAX) 10 MG suppository Place 10 mg rectally every 3 (three) days.  . busPIRone (BUSPAR) 15 MG tablet Take 15 mg by mouth 3 (three) times daily.  . cholecalciferol (VITAMIN D) 1000 UNITS tablet Take 2,000 Units by mouth daily.  . citalopram (CELEXA) 40 MG tablet Take 40 mg by mouth daily.   . Cranberry 450 MG CAPS Take 1 capsule by mouth 2 (two) times  daily.  . diazepam (VALIUM) 2 MG tablet Take 2 mg by mouth 2 (two) times daily. 1/2  tab (63mf) twice a day for muscle spasms  . diclofenac sodium (VOLTAREN) 1 % GEL Apply 2 g topically 3 (three) times daily as needed.  . diphenhydrAMINE (BENADRYL) 25 mg capsule Take 25 mg by mouth every 8 (eight) hours as needed for itching.  . docusate sodium (COLACE) 100 MG capsule Take 100 mg by mouth daily.   . ferrous sulfate 220 (44 Fe) MG/5ML solution Take 220 mg by mouth at  bedtime.  . fluticasone (FLONASE) 50 MCG/ACT nasal spray Place 1 spray into both nostrils daily.  Marland Kitchen guaifenesin (ROBITUSSIN) 100 MG/5ML syrup Take 200 mg by mouth 4 (four) times daily as needed for cough.  Marland Kitchen HYDROcodone-acetaminophen (NORCO/VICODIN) 5-325 MG per tablet Take 1 tablet by mouth 2 (two) times daily.   Marland Kitchen HYDROcodone-acetaminophen (NORCO/VICODIN) 5-325 MG tablet Take 1 tablet by mouth daily as needed for moderate pain. One tablet by mouth daily at lunch time as needed for breakthrough pain  . hydrocortisone cream 1 % Apply 1 application topically 2 (two) times daily as needed for itching. Apply to affected areas twice daily as needed for itching  . ketoconazole (NIZORAL) 2 % cream Apply 1 application topically daily. Shampoo hair twice weekly for treatment of dry scalp  . ketotifen (ZADITOR) 0.025 % ophthalmic solution Place 1 drop into both eyes 2 (two) times daily as needed.  . loperamide (IMODIUM A-D) 2 MG tablet Take 2 mg by mouth 4 (four) times daily as needed for diarrhea or loose stools. Give 4mg  po prn loose stools. May repeat 2mg  po tid for additional loose stools  . ondansetron (ZOFRAN) 4 MG tablet Take 4 mg by mouth every 8 (eight) hours as needed for nausea or vomiting.  . pantoprazole (PROTONIX) 40 MG tablet Take 1 tablet (40 mg total) by mouth 2 (two) times daily.  . Phenol (CHLORASEPTIC MT) Use as directed in the mouth or throat. One spray to throat every 2 hours as needed for sore throad  . polyethylene glycol (MIRALAX / GLYCOLAX) packet Take 17 g by mouth at bedtime.  . promethazine (PHENERGAN) 25 MG/ML injection Inject 25 mg into the muscle every 6 (six) hours as needed for nausea or vomiting. For nausea and vomiting  . saccharomyces boulardii (FLORASTOR) 250 MG capsule Take 250 mg by mouth 2 (two) times daily.  Marland Kitchen senna (SENOKOT) 8.6 MG tablet Take 1 tablet by mouth at bedtime.  . sucralfate (CARAFATE) 1 g tablet Take 1 g by mouth 2 (two) times daily.  Marland Kitchen triamcinolone  cream (KENALOG) 0.1 % Apply 1 application topically 2 (two) times daily.   No facility-administered encounter medications on file as of 06/23/2019.     PHYSICAL EXAM:   Tele-health visit facilitated by facility [facility] Patient was lying supine in bed. She appears frail. She was complaining of fatigue. She was alert and pleasantly conversant.  Limited PE d/t tele-health nature of visit  Julianne Handler, NP

## 2019-07-01 DIAGNOSIS — M25561 Pain in right knee: Secondary | ICD-10-CM | POA: Diagnosis not present

## 2019-07-01 DIAGNOSIS — M810 Age-related osteoporosis without current pathological fracture: Secondary | ICD-10-CM | POA: Diagnosis not present

## 2019-07-01 DIAGNOSIS — R293 Abnormal posture: Secondary | ICD-10-CM | POA: Diagnosis not present

## 2019-07-01 DIAGNOSIS — G809 Cerebral palsy, unspecified: Secondary | ICD-10-CM | POA: Diagnosis not present

## 2019-07-01 DIAGNOSIS — R1312 Dysphagia, oropharyngeal phase: Secondary | ICD-10-CM | POA: Diagnosis not present

## 2019-07-01 DIAGNOSIS — Q845 Enlarged and hypertrophic nails: Secondary | ICD-10-CM | POA: Diagnosis not present

## 2019-07-01 DIAGNOSIS — F331 Major depressive disorder, recurrent, moderate: Secondary | ICD-10-CM | POA: Diagnosis not present

## 2019-07-01 DIAGNOSIS — R29818 Other symptoms and signs involving the nervous system: Secondary | ICD-10-CM | POA: Diagnosis not present

## 2019-07-01 DIAGNOSIS — R278 Other lack of coordination: Secondary | ICD-10-CM | POA: Diagnosis not present

## 2019-07-01 DIAGNOSIS — I739 Peripheral vascular disease, unspecified: Secondary | ICD-10-CM | POA: Diagnosis not present

## 2019-07-01 DIAGNOSIS — F064 Anxiety disorder due to known physiological condition: Secondary | ICD-10-CM | POA: Diagnosis not present

## 2019-07-01 DIAGNOSIS — R1311 Dysphagia, oral phase: Secondary | ICD-10-CM | POA: Diagnosis not present

## 2019-07-01 DIAGNOSIS — Z20828 Contact with and (suspected) exposure to other viral communicable diseases: Secondary | ICD-10-CM | POA: Diagnosis not present

## 2019-07-01 DIAGNOSIS — R41841 Cognitive communication deficit: Secondary | ICD-10-CM | POA: Diagnosis not present

## 2019-07-01 DIAGNOSIS — B351 Tinea unguium: Secondary | ICD-10-CM | POA: Diagnosis not present

## 2019-07-01 DIAGNOSIS — J189 Pneumonia, unspecified organism: Secondary | ICD-10-CM | POA: Diagnosis not present

## 2019-07-08 DIAGNOSIS — F064 Anxiety disorder due to known physiological condition: Secondary | ICD-10-CM | POA: Diagnosis not present

## 2019-07-08 DIAGNOSIS — F331 Major depressive disorder, recurrent, moderate: Secondary | ICD-10-CM | POA: Diagnosis not present

## 2019-08-04 DIAGNOSIS — Z20828 Contact with and (suspected) exposure to other viral communicable diseases: Secondary | ICD-10-CM | POA: Diagnosis not present

## 2019-08-06 DIAGNOSIS — Z23 Encounter for immunization: Secondary | ICD-10-CM | POA: Diagnosis not present

## 2019-08-07 DIAGNOSIS — R29818 Other symptoms and signs involving the nervous system: Secondary | ICD-10-CM | POA: Diagnosis not present

## 2019-08-07 DIAGNOSIS — R278 Other lack of coordination: Secondary | ICD-10-CM | POA: Diagnosis not present

## 2019-08-07 DIAGNOSIS — R1312 Dysphagia, oropharyngeal phase: Secondary | ICD-10-CM | POA: Diagnosis not present

## 2019-08-07 DIAGNOSIS — J189 Pneumonia, unspecified organism: Secondary | ICD-10-CM | POA: Diagnosis not present

## 2019-08-07 DIAGNOSIS — G809 Cerebral palsy, unspecified: Secondary | ICD-10-CM | POA: Diagnosis not present

## 2019-08-07 DIAGNOSIS — R293 Abnormal posture: Secondary | ICD-10-CM | POA: Diagnosis not present

## 2019-08-07 DIAGNOSIS — M25561 Pain in right knee: Secondary | ICD-10-CM | POA: Diagnosis not present

## 2019-08-07 DIAGNOSIS — R1311 Dysphagia, oral phase: Secondary | ICD-10-CM | POA: Diagnosis not present

## 2019-08-08 DIAGNOSIS — R1311 Dysphagia, oral phase: Secondary | ICD-10-CM | POA: Diagnosis not present

## 2019-08-08 DIAGNOSIS — R29818 Other symptoms and signs involving the nervous system: Secondary | ICD-10-CM | POA: Diagnosis not present

## 2019-08-08 DIAGNOSIS — R293 Abnormal posture: Secondary | ICD-10-CM | POA: Diagnosis not present

## 2019-08-08 DIAGNOSIS — M25561 Pain in right knee: Secondary | ICD-10-CM | POA: Diagnosis not present

## 2019-08-08 DIAGNOSIS — J189 Pneumonia, unspecified organism: Secondary | ICD-10-CM | POA: Diagnosis not present

## 2019-08-08 DIAGNOSIS — G809 Cerebral palsy, unspecified: Secondary | ICD-10-CM | POA: Diagnosis not present

## 2019-08-11 DIAGNOSIS — R1311 Dysphagia, oral phase: Secondary | ICD-10-CM | POA: Diagnosis not present

## 2019-08-11 DIAGNOSIS — R29818 Other symptoms and signs involving the nervous system: Secondary | ICD-10-CM | POA: Diagnosis not present

## 2019-08-11 DIAGNOSIS — G809 Cerebral palsy, unspecified: Secondary | ICD-10-CM | POA: Diagnosis not present

## 2019-08-11 DIAGNOSIS — M25561 Pain in right knee: Secondary | ICD-10-CM | POA: Diagnosis not present

## 2019-08-11 DIAGNOSIS — Z20828 Contact with and (suspected) exposure to other viral communicable diseases: Secondary | ICD-10-CM | POA: Diagnosis not present

## 2019-08-11 DIAGNOSIS — J189 Pneumonia, unspecified organism: Secondary | ICD-10-CM | POA: Diagnosis not present

## 2019-08-11 DIAGNOSIS — R293 Abnormal posture: Secondary | ICD-10-CM | POA: Diagnosis not present

## 2019-08-12 DIAGNOSIS — J189 Pneumonia, unspecified organism: Secondary | ICD-10-CM | POA: Diagnosis not present

## 2019-08-12 DIAGNOSIS — R1311 Dysphagia, oral phase: Secondary | ICD-10-CM | POA: Diagnosis not present

## 2019-08-12 DIAGNOSIS — M25561 Pain in right knee: Secondary | ICD-10-CM | POA: Diagnosis not present

## 2019-08-12 DIAGNOSIS — R29818 Other symptoms and signs involving the nervous system: Secondary | ICD-10-CM | POA: Diagnosis not present

## 2019-08-12 DIAGNOSIS — G809 Cerebral palsy, unspecified: Secondary | ICD-10-CM | POA: Diagnosis not present

## 2019-08-12 DIAGNOSIS — R293 Abnormal posture: Secondary | ICD-10-CM | POA: Diagnosis not present

## 2019-08-13 DIAGNOSIS — M25561 Pain in right knee: Secondary | ICD-10-CM | POA: Diagnosis not present

## 2019-08-13 DIAGNOSIS — G809 Cerebral palsy, unspecified: Secondary | ICD-10-CM | POA: Diagnosis not present

## 2019-08-13 DIAGNOSIS — R293 Abnormal posture: Secondary | ICD-10-CM | POA: Diagnosis not present

## 2019-08-13 DIAGNOSIS — R29818 Other symptoms and signs involving the nervous system: Secondary | ICD-10-CM | POA: Diagnosis not present

## 2019-08-13 DIAGNOSIS — R1311 Dysphagia, oral phase: Secondary | ICD-10-CM | POA: Diagnosis not present

## 2019-08-13 DIAGNOSIS — J189 Pneumonia, unspecified organism: Secondary | ICD-10-CM | POA: Diagnosis not present

## 2019-08-14 DIAGNOSIS — R29818 Other symptoms and signs involving the nervous system: Secondary | ICD-10-CM | POA: Diagnosis not present

## 2019-08-14 DIAGNOSIS — M25561 Pain in right knee: Secondary | ICD-10-CM | POA: Diagnosis not present

## 2019-08-14 DIAGNOSIS — J189 Pneumonia, unspecified organism: Secondary | ICD-10-CM | POA: Diagnosis not present

## 2019-08-14 DIAGNOSIS — R1311 Dysphagia, oral phase: Secondary | ICD-10-CM | POA: Diagnosis not present

## 2019-08-14 DIAGNOSIS — G809 Cerebral palsy, unspecified: Secondary | ICD-10-CM | POA: Diagnosis not present

## 2019-08-14 DIAGNOSIS — R293 Abnormal posture: Secondary | ICD-10-CM | POA: Diagnosis not present

## 2019-08-15 DIAGNOSIS — G3184 Mild cognitive impairment, so stated: Secondary | ICD-10-CM | POA: Diagnosis not present

## 2019-08-15 DIAGNOSIS — R1311 Dysphagia, oral phase: Secondary | ICD-10-CM | POA: Diagnosis not present

## 2019-08-15 DIAGNOSIS — R29818 Other symptoms and signs involving the nervous system: Secondary | ICD-10-CM | POA: Diagnosis not present

## 2019-08-15 DIAGNOSIS — R293 Abnormal posture: Secondary | ICD-10-CM | POA: Diagnosis not present

## 2019-08-15 DIAGNOSIS — G809 Cerebral palsy, unspecified: Secondary | ICD-10-CM | POA: Diagnosis not present

## 2019-08-15 DIAGNOSIS — F331 Major depressive disorder, recurrent, moderate: Secondary | ICD-10-CM | POA: Diagnosis not present

## 2019-08-15 DIAGNOSIS — J189 Pneumonia, unspecified organism: Secondary | ICD-10-CM | POA: Diagnosis not present

## 2019-08-15 DIAGNOSIS — M25561 Pain in right knee: Secondary | ICD-10-CM | POA: Diagnosis not present

## 2019-08-17 DIAGNOSIS — Z20828 Contact with and (suspected) exposure to other viral communicable diseases: Secondary | ICD-10-CM | POA: Diagnosis not present

## 2019-08-18 DIAGNOSIS — R29818 Other symptoms and signs involving the nervous system: Secondary | ICD-10-CM | POA: Diagnosis not present

## 2019-08-18 DIAGNOSIS — G809 Cerebral palsy, unspecified: Secondary | ICD-10-CM | POA: Diagnosis not present

## 2019-08-18 DIAGNOSIS — J189 Pneumonia, unspecified organism: Secondary | ICD-10-CM | POA: Diagnosis not present

## 2019-08-18 DIAGNOSIS — M25561 Pain in right knee: Secondary | ICD-10-CM | POA: Diagnosis not present

## 2019-08-18 DIAGNOSIS — R1311 Dysphagia, oral phase: Secondary | ICD-10-CM | POA: Diagnosis not present

## 2019-08-18 DIAGNOSIS — R293 Abnormal posture: Secondary | ICD-10-CM | POA: Diagnosis not present

## 2019-08-19 DIAGNOSIS — R1311 Dysphagia, oral phase: Secondary | ICD-10-CM | POA: Diagnosis not present

## 2019-08-19 DIAGNOSIS — R293 Abnormal posture: Secondary | ICD-10-CM | POA: Diagnosis not present

## 2019-08-19 DIAGNOSIS — J189 Pneumonia, unspecified organism: Secondary | ICD-10-CM | POA: Diagnosis not present

## 2019-08-19 DIAGNOSIS — M25561 Pain in right knee: Secondary | ICD-10-CM | POA: Diagnosis not present

## 2019-08-19 DIAGNOSIS — R29818 Other symptoms and signs involving the nervous system: Secondary | ICD-10-CM | POA: Diagnosis not present

## 2019-08-19 DIAGNOSIS — G809 Cerebral palsy, unspecified: Secondary | ICD-10-CM | POA: Diagnosis not present

## 2019-08-20 DIAGNOSIS — G809 Cerebral palsy, unspecified: Secondary | ICD-10-CM | POA: Diagnosis not present

## 2019-08-20 DIAGNOSIS — Z66 Do not resuscitate: Secondary | ICD-10-CM | POA: Diagnosis not present

## 2019-08-20 DIAGNOSIS — M25561 Pain in right knee: Secondary | ICD-10-CM | POA: Diagnosis not present

## 2019-08-20 DIAGNOSIS — J189 Pneumonia, unspecified organism: Secondary | ICD-10-CM | POA: Diagnosis not present

## 2019-08-20 DIAGNOSIS — K5900 Constipation, unspecified: Secondary | ICD-10-CM | POA: Diagnosis not present

## 2019-08-20 DIAGNOSIS — R1311 Dysphagia, oral phase: Secondary | ICD-10-CM | POA: Diagnosis not present

## 2019-08-20 DIAGNOSIS — R293 Abnormal posture: Secondary | ICD-10-CM | POA: Diagnosis not present

## 2019-08-20 DIAGNOSIS — R29818 Other symptoms and signs involving the nervous system: Secondary | ICD-10-CM | POA: Diagnosis not present

## 2019-08-20 DIAGNOSIS — F329 Major depressive disorder, single episode, unspecified: Secondary | ICD-10-CM | POA: Diagnosis not present

## 2019-08-21 DIAGNOSIS — R293 Abnormal posture: Secondary | ICD-10-CM | POA: Diagnosis not present

## 2019-08-21 DIAGNOSIS — J189 Pneumonia, unspecified organism: Secondary | ICD-10-CM | POA: Diagnosis not present

## 2019-08-21 DIAGNOSIS — G809 Cerebral palsy, unspecified: Secondary | ICD-10-CM | POA: Diagnosis not present

## 2019-08-21 DIAGNOSIS — R29818 Other symptoms and signs involving the nervous system: Secondary | ICD-10-CM | POA: Diagnosis not present

## 2019-08-21 DIAGNOSIS — R1311 Dysphagia, oral phase: Secondary | ICD-10-CM | POA: Diagnosis not present

## 2019-08-21 DIAGNOSIS — M25561 Pain in right knee: Secondary | ICD-10-CM | POA: Diagnosis not present

## 2019-08-22 DIAGNOSIS — G809 Cerebral palsy, unspecified: Secondary | ICD-10-CM | POA: Diagnosis not present

## 2019-08-22 DIAGNOSIS — M25561 Pain in right knee: Secondary | ICD-10-CM | POA: Diagnosis not present

## 2019-08-22 DIAGNOSIS — R29818 Other symptoms and signs involving the nervous system: Secondary | ICD-10-CM | POA: Diagnosis not present

## 2019-08-22 DIAGNOSIS — J189 Pneumonia, unspecified organism: Secondary | ICD-10-CM | POA: Diagnosis not present

## 2019-08-22 DIAGNOSIS — R293 Abnormal posture: Secondary | ICD-10-CM | POA: Diagnosis not present

## 2019-08-22 DIAGNOSIS — R1311 Dysphagia, oral phase: Secondary | ICD-10-CM | POA: Diagnosis not present

## 2019-08-25 DIAGNOSIS — R1311 Dysphagia, oral phase: Secondary | ICD-10-CM | POA: Diagnosis not present

## 2019-08-25 DIAGNOSIS — G809 Cerebral palsy, unspecified: Secondary | ICD-10-CM | POA: Diagnosis not present

## 2019-08-25 DIAGNOSIS — Z20828 Contact with and (suspected) exposure to other viral communicable diseases: Secondary | ICD-10-CM | POA: Diagnosis not present

## 2019-08-25 DIAGNOSIS — M25561 Pain in right knee: Secondary | ICD-10-CM | POA: Diagnosis not present

## 2019-08-25 DIAGNOSIS — R293 Abnormal posture: Secondary | ICD-10-CM | POA: Diagnosis not present

## 2019-08-25 DIAGNOSIS — R29818 Other symptoms and signs involving the nervous system: Secondary | ICD-10-CM | POA: Diagnosis not present

## 2019-08-25 DIAGNOSIS — J189 Pneumonia, unspecified organism: Secondary | ICD-10-CM | POA: Diagnosis not present

## 2019-08-26 DIAGNOSIS — G809 Cerebral palsy, unspecified: Secondary | ICD-10-CM | POA: Diagnosis not present

## 2019-08-26 DIAGNOSIS — M25561 Pain in right knee: Secondary | ICD-10-CM | POA: Diagnosis not present

## 2019-08-26 DIAGNOSIS — R29818 Other symptoms and signs involving the nervous system: Secondary | ICD-10-CM | POA: Diagnosis not present

## 2019-08-26 DIAGNOSIS — J189 Pneumonia, unspecified organism: Secondary | ICD-10-CM | POA: Diagnosis not present

## 2019-08-26 DIAGNOSIS — R1311 Dysphagia, oral phase: Secondary | ICD-10-CM | POA: Diagnosis not present

## 2019-08-26 DIAGNOSIS — R293 Abnormal posture: Secondary | ICD-10-CM | POA: Diagnosis not present

## 2019-08-27 DIAGNOSIS — M25561 Pain in right knee: Secondary | ICD-10-CM | POA: Diagnosis not present

## 2019-08-27 DIAGNOSIS — R293 Abnormal posture: Secondary | ICD-10-CM | POA: Diagnosis not present

## 2019-08-27 DIAGNOSIS — J189 Pneumonia, unspecified organism: Secondary | ICD-10-CM | POA: Diagnosis not present

## 2019-08-27 DIAGNOSIS — R1311 Dysphagia, oral phase: Secondary | ICD-10-CM | POA: Diagnosis not present

## 2019-08-27 DIAGNOSIS — G809 Cerebral palsy, unspecified: Secondary | ICD-10-CM | POA: Diagnosis not present

## 2019-08-27 DIAGNOSIS — R29818 Other symptoms and signs involving the nervous system: Secondary | ICD-10-CM | POA: Diagnosis not present

## 2019-08-28 DIAGNOSIS — R29818 Other symptoms and signs involving the nervous system: Secondary | ICD-10-CM | POA: Diagnosis not present

## 2019-08-28 DIAGNOSIS — R1311 Dysphagia, oral phase: Secondary | ICD-10-CM | POA: Diagnosis not present

## 2019-08-28 DIAGNOSIS — R293 Abnormal posture: Secondary | ICD-10-CM | POA: Diagnosis not present

## 2019-08-28 DIAGNOSIS — J189 Pneumonia, unspecified organism: Secondary | ICD-10-CM | POA: Diagnosis not present

## 2019-08-28 DIAGNOSIS — G809 Cerebral palsy, unspecified: Secondary | ICD-10-CM | POA: Diagnosis not present

## 2019-08-28 DIAGNOSIS — M25561 Pain in right knee: Secondary | ICD-10-CM | POA: Diagnosis not present

## 2019-08-29 DIAGNOSIS — G809 Cerebral palsy, unspecified: Secondary | ICD-10-CM | POA: Diagnosis not present

## 2019-08-29 DIAGNOSIS — J189 Pneumonia, unspecified organism: Secondary | ICD-10-CM | POA: Diagnosis not present

## 2019-08-29 DIAGNOSIS — R1311 Dysphagia, oral phase: Secondary | ICD-10-CM | POA: Diagnosis not present

## 2019-08-29 DIAGNOSIS — R29818 Other symptoms and signs involving the nervous system: Secondary | ICD-10-CM | POA: Diagnosis not present

## 2019-08-29 DIAGNOSIS — M25561 Pain in right knee: Secondary | ICD-10-CM | POA: Diagnosis not present

## 2019-08-29 DIAGNOSIS — R293 Abnormal posture: Secondary | ICD-10-CM | POA: Diagnosis not present

## 2019-09-01 DIAGNOSIS — F064 Anxiety disorder due to known physiological condition: Secondary | ICD-10-CM | POA: Diagnosis not present

## 2019-09-01 DIAGNOSIS — N390 Urinary tract infection, site not specified: Secondary | ICD-10-CM | POA: Diagnosis not present

## 2019-09-01 DIAGNOSIS — E785 Hyperlipidemia, unspecified: Secondary | ICD-10-CM | POA: Diagnosis not present

## 2019-09-01 DIAGNOSIS — F329 Major depressive disorder, single episode, unspecified: Secondary | ICD-10-CM | POA: Diagnosis not present

## 2019-09-01 DIAGNOSIS — G3184 Mild cognitive impairment, so stated: Secondary | ICD-10-CM | POA: Diagnosis not present

## 2019-09-01 DIAGNOSIS — M25561 Pain in right knee: Secondary | ICD-10-CM | POA: Diagnosis not present

## 2019-09-01 DIAGNOSIS — M810 Age-related osteoporosis without current pathological fracture: Secondary | ICD-10-CM | POA: Diagnosis not present

## 2019-09-01 DIAGNOSIS — D6489 Other specified anemias: Secondary | ICD-10-CM | POA: Diagnosis not present

## 2019-09-01 DIAGNOSIS — E876 Hypokalemia: Secondary | ICD-10-CM | POA: Diagnosis not present

## 2019-09-01 DIAGNOSIS — F70 Mild intellectual disabilities: Secondary | ICD-10-CM | POA: Diagnosis not present

## 2019-09-01 DIAGNOSIS — J189 Pneumonia, unspecified organism: Secondary | ICD-10-CM | POA: Diagnosis not present

## 2019-09-01 DIAGNOSIS — G248 Other dystonia: Secondary | ICD-10-CM | POA: Diagnosis not present

## 2019-09-01 DIAGNOSIS — D649 Anemia, unspecified: Secondary | ICD-10-CM | POA: Diagnosis not present

## 2019-09-01 DIAGNOSIS — F411 Generalized anxiety disorder: Secondary | ICD-10-CM | POA: Diagnosis not present

## 2019-09-01 DIAGNOSIS — G809 Cerebral palsy, unspecified: Secondary | ICD-10-CM | POA: Diagnosis not present

## 2019-09-01 DIAGNOSIS — R319 Hematuria, unspecified: Secondary | ICD-10-CM | POA: Diagnosis not present

## 2019-09-01 DIAGNOSIS — I5032 Chronic diastolic (congestive) heart failure: Secondary | ICD-10-CM | POA: Diagnosis not present

## 2019-09-01 DIAGNOSIS — F419 Anxiety disorder, unspecified: Secondary | ICD-10-CM | POA: Diagnosis not present

## 2019-09-01 DIAGNOSIS — Q845 Enlarged and hypertrophic nails: Secondary | ICD-10-CM | POA: Diagnosis not present

## 2019-09-01 DIAGNOSIS — B351 Tinea unguium: Secondary | ICD-10-CM | POA: Diagnosis not present

## 2019-09-01 DIAGNOSIS — R278 Other lack of coordination: Secondary | ICD-10-CM | POA: Diagnosis not present

## 2019-09-01 DIAGNOSIS — Z23 Encounter for immunization: Secondary | ICD-10-CM | POA: Diagnosis not present

## 2019-09-01 DIAGNOSIS — Z20828 Contact with and (suspected) exposure to other viral communicable diseases: Secondary | ICD-10-CM | POA: Diagnosis not present

## 2019-09-01 DIAGNOSIS — F331 Major depressive disorder, recurrent, moderate: Secondary | ICD-10-CM | POA: Diagnosis not present

## 2019-09-01 DIAGNOSIS — F3341 Major depressive disorder, recurrent, in partial remission: Secondary | ICD-10-CM | POA: Diagnosis not present

## 2019-09-01 DIAGNOSIS — R1311 Dysphagia, oral phase: Secondary | ICD-10-CM | POA: Diagnosis not present

## 2019-09-01 DIAGNOSIS — E7089 Other disorders of aromatic amino-acid metabolism: Secondary | ICD-10-CM | POA: Diagnosis not present

## 2019-09-01 DIAGNOSIS — R29818 Other symptoms and signs involving the nervous system: Secondary | ICD-10-CM | POA: Diagnosis not present

## 2019-09-01 DIAGNOSIS — R293 Abnormal posture: Secondary | ICD-10-CM | POA: Diagnosis not present

## 2019-09-01 DIAGNOSIS — K5900 Constipation, unspecified: Secondary | ICD-10-CM | POA: Diagnosis not present

## 2019-09-01 DIAGNOSIS — R1312 Dysphagia, oropharyngeal phase: Secondary | ICD-10-CM | POA: Diagnosis not present

## 2019-09-01 DIAGNOSIS — R569 Unspecified convulsions: Secondary | ICD-10-CM | POA: Diagnosis not present

## 2019-09-01 DIAGNOSIS — R451 Restlessness and agitation: Secondary | ICD-10-CM | POA: Diagnosis not present

## 2019-09-01 DIAGNOSIS — E559 Vitamin D deficiency, unspecified: Secondary | ICD-10-CM | POA: Diagnosis not present

## 2019-09-01 DIAGNOSIS — Z79899 Other long term (current) drug therapy: Secondary | ICD-10-CM | POA: Diagnosis not present

## 2019-09-01 DIAGNOSIS — E039 Hypothyroidism, unspecified: Secondary | ICD-10-CM | POA: Diagnosis not present

## 2019-09-01 DIAGNOSIS — Z66 Do not resuscitate: Secondary | ICD-10-CM | POA: Diagnosis not present

## 2019-09-01 DIAGNOSIS — I739 Peripheral vascular disease, unspecified: Secondary | ICD-10-CM | POA: Diagnosis not present

## 2019-09-01 DIAGNOSIS — R4182 Altered mental status, unspecified: Secondary | ICD-10-CM | POA: Diagnosis not present

## 2019-09-01 DIAGNOSIS — I10 Essential (primary) hypertension: Secondary | ICD-10-CM | POA: Diagnosis not present

## 2019-09-02 DIAGNOSIS — G809 Cerebral palsy, unspecified: Secondary | ICD-10-CM | POA: Diagnosis not present

## 2019-09-02 DIAGNOSIS — R4182 Altered mental status, unspecified: Secondary | ICD-10-CM | POA: Diagnosis not present

## 2019-09-02 DIAGNOSIS — F419 Anxiety disorder, unspecified: Secondary | ICD-10-CM | POA: Diagnosis not present

## 2019-09-02 DIAGNOSIS — G248 Other dystonia: Secondary | ICD-10-CM | POA: Diagnosis not present

## 2019-09-03 DIAGNOSIS — F331 Major depressive disorder, recurrent, moderate: Secondary | ICD-10-CM | POA: Diagnosis not present

## 2019-09-03 DIAGNOSIS — Z23 Encounter for immunization: Secondary | ICD-10-CM | POA: Diagnosis not present

## 2019-09-04 DIAGNOSIS — R319 Hematuria, unspecified: Secondary | ICD-10-CM | POA: Diagnosis not present

## 2019-09-04 DIAGNOSIS — N390 Urinary tract infection, site not specified: Secondary | ICD-10-CM | POA: Diagnosis not present

## 2019-09-05 DIAGNOSIS — G809 Cerebral palsy, unspecified: Secondary | ICD-10-CM | POA: Diagnosis not present

## 2019-09-05 DIAGNOSIS — R4182 Altered mental status, unspecified: Secondary | ICD-10-CM | POA: Diagnosis not present

## 2019-09-05 DIAGNOSIS — N390 Urinary tract infection, site not specified: Secondary | ICD-10-CM | POA: Diagnosis not present

## 2019-09-05 DIAGNOSIS — F419 Anxiety disorder, unspecified: Secondary | ICD-10-CM | POA: Diagnosis not present

## 2019-09-09 DIAGNOSIS — R4182 Altered mental status, unspecified: Secondary | ICD-10-CM | POA: Diagnosis not present

## 2019-09-09 DIAGNOSIS — N390 Urinary tract infection, site not specified: Secondary | ICD-10-CM | POA: Diagnosis not present

## 2019-09-09 DIAGNOSIS — R451 Restlessness and agitation: Secondary | ICD-10-CM | POA: Diagnosis not present

## 2019-09-09 DIAGNOSIS — G809 Cerebral palsy, unspecified: Secondary | ICD-10-CM | POA: Diagnosis not present

## 2019-09-16 DIAGNOSIS — F331 Major depressive disorder, recurrent, moderate: Secondary | ICD-10-CM | POA: Diagnosis not present

## 2019-09-16 DIAGNOSIS — F411 Generalized anxiety disorder: Secondary | ICD-10-CM | POA: Diagnosis not present

## 2019-09-17 DIAGNOSIS — G3184 Mild cognitive impairment, so stated: Secondary | ICD-10-CM | POA: Diagnosis not present

## 2019-09-17 DIAGNOSIS — F331 Major depressive disorder, recurrent, moderate: Secondary | ICD-10-CM | POA: Diagnosis not present

## 2019-10-01 DIAGNOSIS — F331 Major depressive disorder, recurrent, moderate: Secondary | ICD-10-CM | POA: Diagnosis not present

## 2019-10-14 DIAGNOSIS — F064 Anxiety disorder due to known physiological condition: Secondary | ICD-10-CM | POA: Diagnosis not present

## 2019-10-14 DIAGNOSIS — F419 Anxiety disorder, unspecified: Secondary | ICD-10-CM | POA: Diagnosis not present

## 2019-10-14 DIAGNOSIS — F331 Major depressive disorder, recurrent, moderate: Secondary | ICD-10-CM | POA: Diagnosis not present

## 2019-10-14 DIAGNOSIS — G3184 Mild cognitive impairment, so stated: Secondary | ICD-10-CM | POA: Diagnosis not present

## 2019-10-15 DIAGNOSIS — G809 Cerebral palsy, unspecified: Secondary | ICD-10-CM | POA: Diagnosis not present

## 2019-10-15 DIAGNOSIS — F329 Major depressive disorder, single episode, unspecified: Secondary | ICD-10-CM | POA: Diagnosis not present

## 2019-10-15 DIAGNOSIS — Z66 Do not resuscitate: Secondary | ICD-10-CM | POA: Diagnosis not present

## 2019-10-15 DIAGNOSIS — K5900 Constipation, unspecified: Secondary | ICD-10-CM | POA: Diagnosis not present

## 2019-10-28 DIAGNOSIS — F331 Major depressive disorder, recurrent, moderate: Secondary | ICD-10-CM | POA: Diagnosis not present

## 2019-10-28 DIAGNOSIS — F419 Anxiety disorder, unspecified: Secondary | ICD-10-CM | POA: Diagnosis not present

## 2019-10-28 DIAGNOSIS — F064 Anxiety disorder due to known physiological condition: Secondary | ICD-10-CM | POA: Diagnosis not present

## 2019-10-28 DIAGNOSIS — G3184 Mild cognitive impairment, so stated: Secondary | ICD-10-CM | POA: Diagnosis not present

## 2019-11-11 DIAGNOSIS — G3184 Mild cognitive impairment, so stated: Secondary | ICD-10-CM | POA: Diagnosis not present

## 2019-11-11 DIAGNOSIS — F331 Major depressive disorder, recurrent, moderate: Secondary | ICD-10-CM | POA: Diagnosis not present

## 2019-11-11 DIAGNOSIS — F419 Anxiety disorder, unspecified: Secondary | ICD-10-CM | POA: Diagnosis not present

## 2019-11-19 DIAGNOSIS — F70 Mild intellectual disabilities: Secondary | ICD-10-CM | POA: Diagnosis not present

## 2019-11-19 DIAGNOSIS — F419 Anxiety disorder, unspecified: Secondary | ICD-10-CM | POA: Diagnosis not present

## 2019-11-19 DIAGNOSIS — F3341 Major depressive disorder, recurrent, in partial remission: Secondary | ICD-10-CM | POA: Diagnosis not present

## 2019-11-25 DIAGNOSIS — G3184 Mild cognitive impairment, so stated: Secondary | ICD-10-CM | POA: Diagnosis not present

## 2019-11-25 DIAGNOSIS — F064 Anxiety disorder due to known physiological condition: Secondary | ICD-10-CM | POA: Diagnosis not present

## 2019-11-25 DIAGNOSIS — F331 Major depressive disorder, recurrent, moderate: Secondary | ICD-10-CM | POA: Diagnosis not present

## 2019-11-25 DIAGNOSIS — F419 Anxiety disorder, unspecified: Secondary | ICD-10-CM | POA: Diagnosis not present

## 2019-11-30 DIAGNOSIS — Z03818 Encounter for observation for suspected exposure to other biological agents ruled out: Secondary | ICD-10-CM | POA: Diagnosis not present

## 2019-12-02 DIAGNOSIS — F419 Anxiety disorder, unspecified: Secondary | ICD-10-CM | POA: Diagnosis not present

## 2019-12-02 DIAGNOSIS — F70 Mild intellectual disabilities: Secondary | ICD-10-CM | POA: Diagnosis not present

## 2019-12-02 DIAGNOSIS — F064 Anxiety disorder due to known physiological condition: Secondary | ICD-10-CM | POA: Diagnosis not present

## 2019-12-02 DIAGNOSIS — F331 Major depressive disorder, recurrent, moderate: Secondary | ICD-10-CM | POA: Diagnosis not present

## 2019-12-02 DIAGNOSIS — G3184 Mild cognitive impairment, so stated: Secondary | ICD-10-CM | POA: Diagnosis not present

## 2019-12-09 DIAGNOSIS — Z20828 Contact with and (suspected) exposure to other viral communicable diseases: Secondary | ICD-10-CM | POA: Diagnosis not present

## 2019-12-13 DIAGNOSIS — F419 Anxiety disorder, unspecified: Secondary | ICD-10-CM | POA: Diagnosis not present

## 2019-12-13 DIAGNOSIS — F331 Major depressive disorder, recurrent, moderate: Secondary | ICD-10-CM | POA: Diagnosis not present

## 2019-12-16 DIAGNOSIS — F419 Anxiety disorder, unspecified: Secondary | ICD-10-CM | POA: Diagnosis not present

## 2019-12-16 DIAGNOSIS — F064 Anxiety disorder due to known physiological condition: Secondary | ICD-10-CM | POA: Diagnosis not present

## 2019-12-16 DIAGNOSIS — F331 Major depressive disorder, recurrent, moderate: Secondary | ICD-10-CM | POA: Diagnosis not present

## 2019-12-16 DIAGNOSIS — G3184 Mild cognitive impairment, so stated: Secondary | ICD-10-CM | POA: Diagnosis not present

## 2019-12-17 DIAGNOSIS — Z66 Do not resuscitate: Secondary | ICD-10-CM | POA: Diagnosis not present

## 2019-12-17 DIAGNOSIS — F329 Major depressive disorder, single episode, unspecified: Secondary | ICD-10-CM | POA: Diagnosis not present

## 2019-12-17 DIAGNOSIS — K5900 Constipation, unspecified: Secondary | ICD-10-CM | POA: Diagnosis not present

## 2019-12-17 DIAGNOSIS — G809 Cerebral palsy, unspecified: Secondary | ICD-10-CM | POA: Diagnosis not present

## 2019-12-23 DIAGNOSIS — F419 Anxiety disorder, unspecified: Secondary | ICD-10-CM | POA: Diagnosis not present

## 2019-12-23 DIAGNOSIS — R451 Restlessness and agitation: Secondary | ICD-10-CM | POA: Diagnosis not present

## 2019-12-23 DIAGNOSIS — D509 Iron deficiency anemia, unspecified: Secondary | ICD-10-CM | POA: Diagnosis not present

## 2019-12-23 DIAGNOSIS — F329 Major depressive disorder, single episode, unspecified: Secondary | ICD-10-CM | POA: Diagnosis not present

## 2019-12-30 DIAGNOSIS — F064 Anxiety disorder due to known physiological condition: Secondary | ICD-10-CM | POA: Diagnosis not present

## 2019-12-30 DIAGNOSIS — F419 Anxiety disorder, unspecified: Secondary | ICD-10-CM | POA: Diagnosis not present

## 2019-12-30 DIAGNOSIS — F331 Major depressive disorder, recurrent, moderate: Secondary | ICD-10-CM | POA: Diagnosis not present

## 2019-12-30 DIAGNOSIS — G3184 Mild cognitive impairment, so stated: Secondary | ICD-10-CM | POA: Diagnosis not present

## 2019-12-31 DIAGNOSIS — E876 Hypokalemia: Secondary | ICD-10-CM | POA: Diagnosis not present

## 2019-12-31 DIAGNOSIS — I5032 Chronic diastolic (congestive) heart failure: Secondary | ICD-10-CM | POA: Diagnosis not present

## 2019-12-31 DIAGNOSIS — R569 Unspecified convulsions: Secondary | ICD-10-CM | POA: Diagnosis not present

## 2019-12-31 DIAGNOSIS — E7089 Other disorders of aromatic amino-acid metabolism: Secondary | ICD-10-CM | POA: Diagnosis not present

## 2019-12-31 DIAGNOSIS — D649 Anemia, unspecified: Secondary | ICD-10-CM | POA: Diagnosis not present

## 2020-01-03 DIAGNOSIS — Z03818 Encounter for observation for suspected exposure to other biological agents ruled out: Secondary | ICD-10-CM | POA: Diagnosis not present

## 2020-01-04 DIAGNOSIS — G809 Cerebral palsy, unspecified: Secondary | ICD-10-CM | POA: Diagnosis not present

## 2020-01-04 DIAGNOSIS — F419 Anxiety disorder, unspecified: Secondary | ICD-10-CM | POA: Diagnosis not present

## 2020-01-04 DIAGNOSIS — F341 Dysthymic disorder: Secondary | ICD-10-CM | POA: Diagnosis not present

## 2020-01-04 DIAGNOSIS — F329 Major depressive disorder, single episode, unspecified: Secondary | ICD-10-CM | POA: Diagnosis not present

## 2020-01-07 DIAGNOSIS — Z20828 Contact with and (suspected) exposure to other viral communicable diseases: Secondary | ICD-10-CM | POA: Diagnosis not present

## 2020-01-08 DIAGNOSIS — F419 Anxiety disorder, unspecified: Secondary | ICD-10-CM | POA: Diagnosis not present

## 2020-01-08 DIAGNOSIS — F331 Major depressive disorder, recurrent, moderate: Secondary | ICD-10-CM | POA: Diagnosis not present

## 2020-01-12 DIAGNOSIS — Z20828 Contact with and (suspected) exposure to other viral communicable diseases: Secondary | ICD-10-CM | POA: Diagnosis not present

## 2020-01-13 DIAGNOSIS — D509 Iron deficiency anemia, unspecified: Secondary | ICD-10-CM | POA: Diagnosis not present

## 2020-01-13 DIAGNOSIS — F329 Major depressive disorder, single episode, unspecified: Secondary | ICD-10-CM | POA: Diagnosis not present

## 2020-01-13 DIAGNOSIS — K1379 Other lesions of oral mucosa: Secondary | ICD-10-CM | POA: Diagnosis not present

## 2020-01-13 DIAGNOSIS — F419 Anxiety disorder, unspecified: Secondary | ICD-10-CM | POA: Diagnosis not present

## 2020-02-11 DIAGNOSIS — K5900 Constipation, unspecified: Secondary | ICD-10-CM | POA: Diagnosis not present

## 2020-02-11 DIAGNOSIS — Z66 Do not resuscitate: Secondary | ICD-10-CM | POA: Diagnosis not present

## 2020-02-11 DIAGNOSIS — F329 Major depressive disorder, single episode, unspecified: Secondary | ICD-10-CM | POA: Diagnosis not present

## 2020-02-11 DIAGNOSIS — G809 Cerebral palsy, unspecified: Secondary | ICD-10-CM | POA: Diagnosis not present

## 2020-02-13 DIAGNOSIS — F331 Major depressive disorder, recurrent, moderate: Secondary | ICD-10-CM | POA: Diagnosis not present

## 2020-02-17 DIAGNOSIS — F0391 Unspecified dementia with behavioral disturbance: Secondary | ICD-10-CM | POA: Diagnosis not present

## 2020-02-17 DIAGNOSIS — F419 Anxiety disorder, unspecified: Secondary | ICD-10-CM | POA: Diagnosis not present

## 2020-02-17 DIAGNOSIS — F331 Major depressive disorder, recurrent, moderate: Secondary | ICD-10-CM | POA: Diagnosis not present

## 2020-02-25 DIAGNOSIS — Z03818 Encounter for observation for suspected exposure to other biological agents ruled out: Secondary | ICD-10-CM | POA: Diagnosis not present

## 2020-03-01 DIAGNOSIS — Z03818 Encounter for observation for suspected exposure to other biological agents ruled out: Secondary | ICD-10-CM | POA: Diagnosis not present

## 2020-03-04 DIAGNOSIS — Z20822 Contact with and (suspected) exposure to covid-19: Secondary | ICD-10-CM | POA: Diagnosis not present

## 2020-03-08 DIAGNOSIS — Z20822 Contact with and (suspected) exposure to covid-19: Secondary | ICD-10-CM | POA: Diagnosis not present

## 2020-03-11 DIAGNOSIS — Z20822 Contact with and (suspected) exposure to covid-19: Secondary | ICD-10-CM | POA: Diagnosis not present

## 2020-03-11 DIAGNOSIS — F419 Anxiety disorder, unspecified: Secondary | ICD-10-CM | POA: Diagnosis not present

## 2020-03-11 DIAGNOSIS — F329 Major depressive disorder, single episode, unspecified: Secondary | ICD-10-CM | POA: Diagnosis not present

## 2020-03-11 DIAGNOSIS — G809 Cerebral palsy, unspecified: Secondary | ICD-10-CM | POA: Diagnosis not present

## 2020-03-11 DIAGNOSIS — M25551 Pain in right hip: Secondary | ICD-10-CM | POA: Diagnosis not present

## 2020-03-11 DIAGNOSIS — K219 Gastro-esophageal reflux disease without esophagitis: Secondary | ICD-10-CM | POA: Diagnosis not present

## 2020-03-11 DIAGNOSIS — D509 Iron deficiency anemia, unspecified: Secondary | ICD-10-CM | POA: Diagnosis not present

## 2020-03-15 DIAGNOSIS — Z20822 Contact with and (suspected) exposure to covid-19: Secondary | ICD-10-CM | POA: Diagnosis not present

## 2020-03-22 DIAGNOSIS — Z20822 Contact with and (suspected) exposure to covid-19: Secondary | ICD-10-CM | POA: Diagnosis not present

## 2020-03-23 DIAGNOSIS — F419 Anxiety disorder, unspecified: Secondary | ICD-10-CM | POA: Diagnosis not present

## 2020-03-23 DIAGNOSIS — F0391 Unspecified dementia with behavioral disturbance: Secondary | ICD-10-CM | POA: Diagnosis not present

## 2020-03-23 DIAGNOSIS — F331 Major depressive disorder, recurrent, moderate: Secondary | ICD-10-CM | POA: Diagnosis not present

## 2020-03-25 DIAGNOSIS — F419 Anxiety disorder, unspecified: Secondary | ICD-10-CM | POA: Diagnosis not present

## 2020-03-25 DIAGNOSIS — F331 Major depressive disorder, recurrent, moderate: Secondary | ICD-10-CM | POA: Diagnosis not present

## 2020-03-26 DIAGNOSIS — I5032 Chronic diastolic (congestive) heart failure: Secondary | ICD-10-CM | POA: Diagnosis not present

## 2020-03-26 DIAGNOSIS — R569 Unspecified convulsions: Secondary | ICD-10-CM | POA: Diagnosis not present

## 2020-03-26 DIAGNOSIS — E7089 Other disorders of aromatic amino-acid metabolism: Secondary | ICD-10-CM | POA: Diagnosis not present

## 2020-03-26 DIAGNOSIS — I10 Essential (primary) hypertension: Secondary | ICD-10-CM | POA: Diagnosis not present

## 2020-03-26 DIAGNOSIS — Z20828 Contact with and (suspected) exposure to other viral communicable diseases: Secondary | ICD-10-CM | POA: Diagnosis not present

## 2020-03-26 DIAGNOSIS — E785 Hyperlipidemia, unspecified: Secondary | ICD-10-CM | POA: Diagnosis not present

## 2020-03-26 DIAGNOSIS — D649 Anemia, unspecified: Secondary | ICD-10-CM | POA: Diagnosis not present

## 2020-04-01 DIAGNOSIS — F331 Major depressive disorder, recurrent, moderate: Secondary | ICD-10-CM | POA: Diagnosis not present

## 2020-04-06 DIAGNOSIS — Z20828 Contact with and (suspected) exposure to other viral communicable diseases: Secondary | ICD-10-CM | POA: Diagnosis not present

## 2020-04-12 DIAGNOSIS — Z20828 Contact with and (suspected) exposure to other viral communicable diseases: Secondary | ICD-10-CM | POA: Diagnosis not present

## 2020-04-14 DIAGNOSIS — F329 Major depressive disorder, single episode, unspecified: Secondary | ICD-10-CM | POA: Diagnosis not present

## 2020-04-14 DIAGNOSIS — K5900 Constipation, unspecified: Secondary | ICD-10-CM | POA: Diagnosis not present

## 2020-04-14 DIAGNOSIS — G809 Cerebral palsy, unspecified: Secondary | ICD-10-CM | POA: Diagnosis not present

## 2020-04-14 DIAGNOSIS — Z66 Do not resuscitate: Secondary | ICD-10-CM | POA: Diagnosis not present

## 2020-04-19 ENCOUNTER — Non-Acute Institutional Stay: Payer: Medicare Other | Admitting: Nurse Practitioner

## 2020-04-19 DIAGNOSIS — Z515 Encounter for palliative care: Secondary | ICD-10-CM

## 2020-04-19 DIAGNOSIS — K5900 Constipation, unspecified: Secondary | ICD-10-CM

## 2020-04-19 DIAGNOSIS — Z20828 Contact with and (suspected) exposure to other viral communicable diseases: Secondary | ICD-10-CM | POA: Diagnosis not present

## 2020-04-19 NOTE — Progress Notes (Addendum)
[name] Consult Note Telephone: [phone]  Fax: [phone]  Addendum: weight updated to reflect actual weight   PATIENT NAME: [name] [address] [place] [address] [phone] (home)  DOB: 03-24-1936 MRN: [id number]  PRIMARY CARE PROVIDER:    Reynold Bowen, MD,  [place] [address] [phone]  REFERRING PROVIDER:   Reynold Bowen, [place],  [place] [address] [phone]  RESPONSIBLE PARTY:   Extended Emergency Contact Information Primary Emergency Contact: Toribio [place] Phone: [phone] Relation: None Secondary Emergency Contact: [name],Denise Address: [phone]  [place] [employer] Phone: [phone] Relation: None  I met face to face with patient in facility.  ASSESSMENT AND RECOMMENDATIONS:   1. Advance Care Planning/Goals of Care:  Goal of care: Goal of care is comfort and safety in the facility. Directives: Code status is DNR. MOST form details includes comfort measures, determine antibiotics use at time of infection, No IVF, no feeding tube. Palliative care will continue to provide support to patient, family and the medical team.    2. Symptom Management:  Patient endorsed constipation today, report last BM to be a week ago. Patient is a poor historian. Unable to validate patient's last BM, staff report patient had a BM this week. Patient with problem of chronic constipation. Facility MAR reviewed, patient currently on Miralax 17g PO daily, Colace 125m daily,Bisacodyl 129mSupp rectally every 3 days and Senokot 8.58m48ms needed. Patient endorsed occasional nausea with no vomiting, has PRN Zofran and Phenergan, abdomen soft and not distended. Recommend closing monitoring of patients bowel movements and scheduling Senokot if constipated.    3. Follow up Palliative Care Visit: Palliative care will continue to follow  for goals of care clarification and symptom management. Return 4 - 8 weeks or prn.  4. Family /Caregiver/Community Supports: Unable to reach DenTurnerd DavShanon Brow discuss visit update, voice mail not set up on phones, will attempt to reach again. Patient's cousins DenLangley Gaussd DavRosana Hoese her HCPOA.  5. Cognitive / Functional decline: Patient alert and oriented to self and place, able to make her needs known. Has spastic episodes, dependent on staff for all of her ADLs. Wheelchair dependent, has abnormal posture often appearing like she would slide of her chair.  I spent 30 minutes providing this consultation, includes time spent with patient/family, chart review, provider coordination, and documentation . More than 50% of the time in this consultation was spent coordinating communication.   HISTORY OF PRESENT ILLNESS:  [name] a 84 11[age]. year old female with multiple medical problems including cerebral palsy, spastic paraplegia, dysphagia . Palliative Care was asked to follow this patient by consultation request of SouReynold BowenD to help address advance care planning and goals of care. This is a follow up visit, last visit 06/23/2019.  CODE STATUS: DNR  PPS: 40%  HOSPICE ELIGIBILITY/DIAGNOSIS: TBD  PAST MEDICAL HISTORY:  Past Medical History:  Diagnosis Date  . [place] ([place] . Constipation   . Hyperlipidemia     SOCIAL HX:  Social History   Tobacco Use  . Smoking status: Never Smoker  . Smokeless tobacco: Never Used  Substance Use Topics  . Alcohol use: No   FAMILY HX: No family history on file.  ALLERGIES: No Known Allergies   PERTINENT MEDICATIONS:  Outpatient Encounter Medications as of 04/19/2020  Medication Sig  . albuterol (2.5 MG/3ML) 0.083% NEBU 3 mL, albuterol (  5 MG/ML) 0.5% NEBU 0.5 mL Inhale 2.5 mg into the lungs every 6 (six) hours. Albuterol sul 2.12m/3ml solution q6hr prn wheezing  . atorvastatin (LIPITOR) 10 MG tablet Take 10 mg by mouth daily.  .  bisacodyl (DULCOLAX) 10 MG suppository Place 10 mg rectally every 3 (three) days.  . busPIRone (BUSPAR) 15 MG tablet Take 15 mg by mouth 3 (three) times daily.  . cholecalciferol (VITAMIN D) 1000 UNITS tablet Take 2,000 Units by mouth daily.  . citalopram (CELEXA) 40 MG tablet Take 40 mg by mouth daily.   . Cranberry 450 MG CAPS Take 1 capsule by mouth 2 (two) times daily.  . diazepam (VALIUM) 2 MG tablet Take 2 mg by mouth 2 (two) times daily. 1/2  tab (190m twice a day for muscle spasms  . diclofenac sodium (VOLTAREN) 1 % GEL Apply 2 g topically 3 (three) times daily as needed.  . diphenhydrAMINE (BENADRYL) 25 mg capsule Take 25 mg by mouth every 8 (eight) hours as needed for itching.  . docusate sodium (COLACE) 100 MG capsule Take 100 mg by mouth daily.   . ferrous sulfate 220 (44 Fe) MG/5ML solution Take 220 mg by mouth at bedtime.  . fluticasone (FLONASE) 50 MCG/ACT nasal spray Place 1 spray into both nostrils daily.  . Marland Kitchenuaifenesin (ROBITUSSIN) 100 MG/5ML syrup Take 200 mg by mouth 4 (four) times daily as needed for cough.  . Marland KitchenYDROcodone-acetaminophen (NORCO/VICODIN) 5-325 MG per tablet Take 1 tablet by mouth 2 (two) times daily.   . Marland KitchenYDROcodone-acetaminophen (NORCO/VICODIN) 5-325 MG tablet Take 1 tablet by mouth daily as needed for moderate pain. One tablet by mouth daily at lunch time as needed for breakthrough pain  . hydrocortisone cream 1 % Apply 1 application topically 2 (two) times daily as needed for itching. Apply to affected areas twice daily as needed for itching  . ketoconazole (NIZORAL) 2 % cream Apply 1 application topically daily. Shampoo hair twice weekly for treatment of dry scalp  . ketotifen (ZADITOR) 0.025 % ophthalmic solution Place 1 drop into both eyes 2 (two) times daily as needed.  . loperamide (IMODIUM A-D) 2 MG tablet Take 2 mg by mouth 4 (four) times daily as needed for diarrhea or loose stools. Give 76m62mo prn loose stools. May repeat 2mg47m tid for additional loose  stools  . ondansetron (ZOFRAN) 4 MG tablet Take 4 mg by mouth every 8 (eight) hours as needed for nausea or vomiting.  . pantoprazole (PROTONIX) 40 MG tablet Take 1 tablet (40 mg total) by mouth 2 (two) times daily.  . Phenol (CHLORASEPTIC MT) Use as directed in the mouth or throat. One spray to throat every 2 hours as needed for sore throad  . polyethylene glycol (MIRALAX / GLYCOLAX) packet Take 17 g by mouth at bedtime.  . promethazine (PHENERGAN) 25 MG/ML injection Inject 25 mg into the muscle every 6 (six) hours as needed for nausea or vomiting. For nausea and vomiting  . saccharomyces boulardii (FLORASTOR) 250 MG capsule Take 250 mg by mouth 2 (two) times daily.  . seMarland Kitchenna (SENOKOT) 8.6 MG tablet Take 1 tablet by mouth at bedtime.  . sucralfate (CARAFATE) 1 g tablet Take 1 g by mouth 2 (two) times daily.  . trMarland Kitchenamcinolone cream (KENALOG) 0.1 % Apply 1 application topically 2 (two) times daily.   No facility-administered encounter medications on file as of 04/19/2020.    PHYSICAL EXAM / ROS:   Current and past weights: 104.2lbs down from 105lbslbs at palliative care  visit 10 months ago. Ht. 46f", BMI 16.8kg/m2 General: NAD, frail appearing, thin Cardiovascular: denied chest pain, no edema, S1S2 normal  Pulmonary: no cough, no increased SOB, room air Abdomen: appetite fair, endorses constipation, incontinent of bowel GU: denies dysuria, incontinent of urine MSK:  Denied muscle or joint pain, wheelchair dependent Skin: no rashes noted on exposed skin Neurological: Muscle spasity,abnormal posturing   QJari Favre DNP, AGPCNP-BC

## 2020-04-20 ENCOUNTER — Other Ambulatory Visit: Payer: Self-pay

## 2020-04-20 DIAGNOSIS — F331 Major depressive disorder, recurrent, moderate: Secondary | ICD-10-CM | POA: Diagnosis not present

## 2020-04-20 DIAGNOSIS — F0391 Unspecified dementia with behavioral disturbance: Secondary | ICD-10-CM | POA: Diagnosis not present

## 2020-04-20 DIAGNOSIS — F419 Anxiety disorder, unspecified: Secondary | ICD-10-CM | POA: Diagnosis not present

## 2020-04-24 DIAGNOSIS — F331 Major depressive disorder, recurrent, moderate: Secondary | ICD-10-CM | POA: Diagnosis not present

## 2020-04-25 DIAGNOSIS — Z20828 Contact with and (suspected) exposure to other viral communicable diseases: Secondary | ICD-10-CM | POA: Diagnosis not present

## 2020-04-26 ENCOUNTER — Other Ambulatory Visit: Payer: Self-pay

## 2020-04-26 ENCOUNTER — Inpatient Hospital Stay (HOSPITAL_COMMUNITY)
Admission: EM | Admit: 2020-04-26 | Discharge: 2020-05-06 | DRG: 177 | Disposition: A | Discharge status: Skilled Nursing Facility | Payer: Medicare Other | Source: Skilled Nursing Facility | Attending: Internal Medicine | Admitting: Internal Medicine

## 2020-04-26 ENCOUNTER — Emergency Department (HOSPITAL_COMMUNITY): Payer: Medicare Other

## 2020-04-26 ENCOUNTER — Encounter (HOSPITAL_COMMUNITY): Payer: Self-pay | Admitting: Emergency Medicine

## 2020-04-26 DIAGNOSIS — R293 Abnormal posture: Secondary | ICD-10-CM | POA: Diagnosis present

## 2020-04-26 DIAGNOSIS — R1312 Dysphagia, oropharyngeal phase: Secondary | ICD-10-CM | POA: Diagnosis present

## 2020-04-26 DIAGNOSIS — R0689 Other abnormalities of breathing: Secondary | ICD-10-CM | POA: Diagnosis not present

## 2020-04-26 DIAGNOSIS — E876 Hypokalemia: Secondary | ICD-10-CM | POA: Diagnosis present

## 2020-04-26 DIAGNOSIS — J189 Pneumonia, unspecified organism: Secondary | ICD-10-CM

## 2020-04-26 DIAGNOSIS — F32A Depression, unspecified: Secondary | ICD-10-CM | POA: Diagnosis present

## 2020-04-26 DIAGNOSIS — M2550 Pain in unspecified joint: Secondary | ICD-10-CM | POA: Diagnosis not present

## 2020-04-26 DIAGNOSIS — J9621 Acute and chronic respiratory failure with hypoxia: Secondary | ICD-10-CM | POA: Diagnosis present

## 2020-04-26 DIAGNOSIS — I517 Cardiomegaly: Secondary | ICD-10-CM | POA: Diagnosis not present

## 2020-04-26 DIAGNOSIS — R64 Cachexia: Secondary | ICD-10-CM | POA: Diagnosis present

## 2020-04-26 DIAGNOSIS — R5381 Other malaise: Secondary | ICD-10-CM | POA: Diagnosis not present

## 2020-04-26 DIAGNOSIS — I959 Hypotension, unspecified: Secondary | ICD-10-CM | POA: Diagnosis present

## 2020-04-26 DIAGNOSIS — R0989 Other specified symptoms and signs involving the circulatory and respiratory systems: Secondary | ICD-10-CM

## 2020-04-26 DIAGNOSIS — K228 Other specified diseases of esophagus: Secondary | ICD-10-CM | POA: Diagnosis not present

## 2020-04-26 DIAGNOSIS — Z66 Do not resuscitate: Secondary | ICD-10-CM | POA: Diagnosis present

## 2020-04-26 DIAGNOSIS — Z20822 Contact with and (suspected) exposure to covid-19: Secondary | ICD-10-CM | POA: Diagnosis present

## 2020-04-26 DIAGNOSIS — G801 Spastic diplegic cerebral palsy: Secondary | ICD-10-CM | POA: Diagnosis present

## 2020-04-26 DIAGNOSIS — R1310 Dysphagia, unspecified: Secondary | ICD-10-CM | POA: Diagnosis not present

## 2020-04-26 DIAGNOSIS — J398 Other specified diseases of upper respiratory tract: Secondary | ICD-10-CM | POA: Diagnosis not present

## 2020-04-26 DIAGNOSIS — K449 Diaphragmatic hernia without obstruction or gangrene: Secondary | ICD-10-CM | POA: Diagnosis not present

## 2020-04-26 DIAGNOSIS — R230 Cyanosis: Secondary | ICD-10-CM | POA: Diagnosis not present

## 2020-04-26 DIAGNOSIS — R0602 Shortness of breath: Secondary | ICD-10-CM | POA: Diagnosis not present

## 2020-04-26 DIAGNOSIS — E785 Hyperlipidemia, unspecified: Secondary | ICD-10-CM | POA: Diagnosis present

## 2020-04-26 DIAGNOSIS — Z8619 Personal history of other infectious and parasitic diseases: Secondary | ICD-10-CM

## 2020-04-26 DIAGNOSIS — G808 Other cerebral palsy: Secondary | ICD-10-CM | POA: Diagnosis not present

## 2020-04-26 DIAGNOSIS — J690 Pneumonitis due to inhalation of food and vomit: Secondary | ICD-10-CM

## 2020-04-26 DIAGNOSIS — I10 Essential (primary) hypertension: Secondary | ICD-10-CM | POA: Diagnosis not present

## 2020-04-26 DIAGNOSIS — Z7401 Bed confinement status: Secondary | ICD-10-CM | POA: Diagnosis not present

## 2020-04-26 DIAGNOSIS — T17920A Food in respiratory tract, part unspecified causing asphyxiation, initial encounter: Secondary | ICD-10-CM | POA: Diagnosis not present

## 2020-04-26 DIAGNOSIS — R41841 Cognitive communication deficit: Secondary | ICD-10-CM | POA: Diagnosis present

## 2020-04-26 DIAGNOSIS — M810 Age-related osteoporosis without current pathological fracture: Secondary | ICD-10-CM | POA: Diagnosis present

## 2020-04-26 DIAGNOSIS — G809 Cerebral palsy, unspecified: Secondary | ICD-10-CM | POA: Diagnosis not present

## 2020-04-26 DIAGNOSIS — R29818 Other symptoms and signs involving the nervous system: Secondary | ICD-10-CM | POA: Diagnosis not present

## 2020-04-26 DIAGNOSIS — J9601 Acute respiratory failure with hypoxia: Secondary | ICD-10-CM | POA: Diagnosis not present

## 2020-04-26 DIAGNOSIS — I700 Atherosclerosis of aorta: Secondary | ICD-10-CM | POA: Diagnosis not present

## 2020-04-26 DIAGNOSIS — R0902 Hypoxemia: Secondary | ICD-10-CM

## 2020-04-26 DIAGNOSIS — R54 Age-related physical debility: Secondary | ICD-10-CM | POA: Diagnosis present

## 2020-04-26 DIAGNOSIS — M25561 Pain in right knee: Secondary | ICD-10-CM | POA: Diagnosis not present

## 2020-04-26 DIAGNOSIS — J90 Pleural effusion, not elsewhere classified: Secondary | ICD-10-CM | POA: Diagnosis not present

## 2020-04-26 DIAGNOSIS — R278 Other lack of coordination: Secondary | ICD-10-CM | POA: Diagnosis not present

## 2020-04-26 DIAGNOSIS — Z6821 Body mass index (BMI) 21.0-21.9, adult: Secondary | ICD-10-CM

## 2020-04-26 DIAGNOSIS — R1311 Dysphagia, oral phase: Secondary | ICD-10-CM | POA: Diagnosis not present

## 2020-04-26 HISTORY — DX: Pneumonitis due to inhalation of food and vomit: J69.0

## 2020-04-26 LAB — CBC WITH DIFFERENTIAL/PLATELET
Abs Immature Granulocytes: 0.12 10*3/uL — ABNORMAL HIGH (ref 0.00–0.07)
Basophils Absolute: 0.1 10*3/uL (ref 0.0–0.1)
Basophils Relative: 0 %
Eosinophils Absolute: 0.1 10*3/uL (ref 0.0–0.5)
Eosinophils Relative: 1 %
HCT: 47.1 % — ABNORMAL HIGH (ref 36.0–46.0)
Hemoglobin: 14.6 g/dL (ref 12.0–15.0)
Immature Granulocytes: 1 %
Lymphocytes Relative: 15 %
Lymphs Abs: 1.7 10*3/uL (ref 0.7–4.0)
MCH: 29.4 pg (ref 26.0–34.0)
MCHC: 31 g/dL (ref 30.0–36.0)
MCV: 94.8 fL (ref 80.0–100.0)
Monocytes Absolute: 0.4 10*3/uL (ref 0.1–1.0)
Monocytes Relative: 3 %
Neutro Abs: 9.2 10*3/uL — ABNORMAL HIGH (ref 1.7–7.7)
Neutrophils Relative %: 80 %
Platelets: 287 10*3/uL (ref 150–400)
RBC: 4.97 MIL/uL (ref 3.87–5.11)
RDW: 13.4 % (ref 11.5–15.5)
WBC: 11.5 10*3/uL — ABNORMAL HIGH (ref 4.0–10.5)
nRBC: 0 % (ref 0.0–0.2)

## 2020-04-26 LAB — BASIC METABOLIC PANEL
Anion gap: 12 (ref 5–15)
BUN: 12 mg/dL (ref 8–23)
CO2: 23 mmol/L (ref 22–32)
Calcium: 9.2 mg/dL (ref 8.9–10.3)
Chloride: 109 mmol/L (ref 98–111)
Creatinine, Ser: 0.78 mg/dL (ref 0.44–1.00)
GFR calc Af Amer: >60 mL/min (ref >60)
GFR calc non Af Amer: >60 mL/min (ref >60)
Glucose, Bld: 203 mg/dL — ABNORMAL HIGH (ref 70–99)
Potassium: 4.2 mmol/L (ref 3.5–5.1)
Sodium: 144 mmol/L (ref 135–145)

## 2020-04-26 LAB — RESPIRATORY PANEL BY RT PCR (FLU A&B, COVID)
Influenza A by PCR: NEGATIVE
Influenza B by PCR: NEGATIVE
SARS Coronavirus 2 by RT PCR: NEGATIVE

## 2020-04-26 MED ORDER — LACTATED RINGERS IV SOLN: INTRAVENOUS | Status: DC

## 2020-04-26 MED ORDER — ENOXAPARIN SODIUM 40 MG/0.4ML ~~LOC~~ SOLN
40.0000 mg | Freq: Every day | SUBCUTANEOUS | Status: DC
  Administered 2020-04-26 – 2020-05-05 (×10): 40 mg via SUBCUTANEOUS
  Filled 2020-04-26 (×11): qty 0.4

## 2020-04-26 MED ORDER — ACETAMINOPHEN 325 MG PO TABS
650.0000 mg | ORAL_TABLET | ORAL | Status: DC | PRN
  Administered 2020-04-27: 650 mg via ORAL
  Administered 2020-04-28: 325 mg via ORAL
  Administered 2020-04-29: 650 mg via ORAL
  Filled 2020-04-26 (×3): qty 2

## 2020-04-26 MED ORDER — DOCUSATE SODIUM 100 MG PO CAPS: 100.0000 mg | ORAL_CAPSULE | Freq: Two times a day (BID) | ORAL | Status: DC | PRN

## 2020-04-26 MED ORDER — POLYETHYLENE GLYCOL 3350 17 G PO PACK: 17.0000 g | PACK | Freq: Every day | ORAL | Status: DC | PRN

## 2020-04-26 MED ORDER — ALBUTEROL SULFATE (2.5 MG/3ML) 0.083% IN NEBU
2.5000 mg | INHALATION_SOLUTION | RESPIRATORY_TRACT | Status: DC | PRN
  Administered 2020-04-30: 2.5 mg via RESPIRATORY_TRACT
  Filled 2020-04-26 (×2): qty 3

## 2020-04-26 MED ORDER — ONDANSETRON HCL 4 MG/2ML IJ SOLN: 4.0000 mg | Freq: Once | INTRAMUSCULAR | Status: AC

## 2020-04-26 MED ORDER — ONDANSETRON HCL 4 MG/2ML IJ SOLN
INTRAMUSCULAR | Status: AC
  Administered 2020-04-26: 4 mg via INTRAVENOUS
  Filled 2020-04-26: qty 2

## 2020-04-26 MED ORDER — PIPERACILLIN-TAZOBACTAM 3.375 G IVPB
3.3750 g | Freq: Three times a day (TID) | INTRAVENOUS | Status: DC
  Administered 2020-04-26 – 2020-04-27 (×2): 3.375 g via INTRAVENOUS
  Filled 2020-04-26 (×3): qty 50

## 2020-04-26 NOTE — ED Triage Notes (Signed)
Patient from [facility], patient was eating dinner and choked on solid food.  She became short of breath after clearing food.  Patient was found in the 70's with oxygen sat.  She is able to swallow, tolerating her own saliva.

## 2020-04-26 NOTE — ED Provider Notes (Signed)
[facility] EMERGENCY DEPARTMENT Provider Note   CSN: [id number] Arrival date & time: 04/26/20  2021     History Chief Complaint  Patient presents with  . Choking    [name] is a 84 y.o. female.  The history is provided by the patient, the EMS personnel and medical records.  Shortness of Breath Severity:  Severe Onset quality:  Sudden Timing:  Constant Progression:  Unchanged Chronicity:  New Context comment:  Choking Relieved by:  Oxygen Worsened by:  Nothing Associated symptoms: no abdominal pain, no chest pain, no cough, no diaphoresis, no fever, no headaches, no sputum production, no vomiting and no wheezing        Past Medical History:  Diagnosis Date  . Cerebral palsy ([place])   . Constipation   . Hyperlipidemia     Patient Active Problem List   Diagnosis Date Noted  . Dysphagia 11/30/2015  . Hiatal hernia with gastroesophageal reflux 05/10/2014  . Spastic paraplegia 05/06/2014  . C. difficile colitis 03/27/2012  . Colitis 03/26/2012  . Diarrhea 03/25/2012  . UTI (lower urinary tract infection) 03/25/2012  . Cerebral palsy ([place]) 03/25/2012  . H/O: GI bleed 03/25/2012  . Esophageal ulcer 01/29/2012  . Hypokalemia 01/29/2012  . Anemia 01/29/2012  . Acute upper GI bleed 01/28/2012  . Hematemesis 01/28/2012    Past Surgical History:  Procedure Laterality Date  . ESOPHAGOGASTRODUODENOSCOPY  01/28/2012   Procedure: ESOPHAGOGASTRODUODENOSCOPY (EGD);  Surgeon: Lear Ng, MD;  Location: [facility] ENDOSCOPY;  Service: Endoscopy;  Laterality: N/A;  . ESOPHAGOGASTRODUODENOSCOPY N/A 05/10/2014   Procedure: ESOPHAGOGASTRODUODENOSCOPY (EGD);  Surgeon: Missy Sabins, MD;  Location: [facility] ENDOSCOPY;  Service: Endoscopy;  Laterality: N/A;  . right hip surgery    . TONSILLECTOMY       OB History   No obstetric history on file.     No family history on file.  Social History   Tobacco Use  . Smoking status: Never Smoker  . Smokeless  tobacco: Never Used  Substance Use Topics  . Alcohol use: No  . Drug use: No    Home Medications Prior to Admission medications   Medication Sig Start Date End Date Taking? Authorizing Provider  albuterol (2.5 MG/3ML) 0.083% NEBU 3 mL, albuterol (5 MG/ML) 0.5% NEBU 0.5 mL Inhale 2.5 mg into the lungs every 6 (six) hours. Albuterol sul 2.5mg /49ml solution q6hr prn wheezing    [provider]  atorvastatin (LIPITOR) 10 MG tablet Take 10 mg by mouth daily.    [provider]  bisacodyl (DULCOLAX) 10 MG suppository Place 10 mg rectally every 3 (three) days.    [provider]  busPIRone (BUSPAR) 15 MG tablet Take 15 mg by mouth 3 (three) times daily.    [provider]  cholecalciferol (VITAMIN D) 1000 UNITS tablet Take 2,000 Units by mouth daily.    [provider]  citalopram (CELEXA) 40 MG tablet Take 40 mg by mouth daily.     [provider]  Cranberry 450 MG CAPS Take 1 capsule by mouth 2 (two) times daily.    [provider]  diazepam (VALIUM) 2 MG tablet Take 2 mg by mouth 2 (two) times daily. 1/2  tab (58mf) twice a day for muscle spasms    [provider]  diclofenac sodium (VOLTAREN) 1 % GEL Apply 2 g topically 3 (three) times daily as needed.    [provider]  diphenhydrAMINE (BENADRYL) 25 mg capsule Take 25 mg by mouth every 8 (  eight) hours as needed for itching.    [provider]  docusate sodium (COLACE) 100 MG capsule Take 100 mg by mouth daily.     [provider]  ferrous sulfate 220 (44 Fe) MG/5ML solution Take 220 mg by mouth at bedtime.    [provider]  fluticasone (FLONASE) 50 MCG/ACT nasal spray Place 1 spray into both nostrils daily.    [provider]  guaifenesin (ROBITUSSIN) 100 MG/5ML syrup Take 200 mg by mouth 4 (four) times daily as needed for cough.    [provider]  HYDROcodone-acetaminophen (NORCO/VICODIN) 5-325 MG per tablet Take 1  tablet by mouth 2 (two) times daily.     [provider]  HYDROcodone-acetaminophen (NORCO/VICODIN) 5-325 MG tablet Take 1 tablet by mouth daily as needed for moderate pain. One tablet by mouth daily at lunch time as needed for breakthrough pain    [provider]  hydrocortisone cream 1 % Apply 1 application topically 2 (two) times daily as needed for itching. Apply to affected areas twice daily as needed for itching    [provider]  ketoconazole (NIZORAL) 2 % cream Apply 1 application topically daily. Shampoo hair twice weekly for treatment of dry scalp    [provider]  ketotifen (ZADITOR) 0.025 % ophthalmic solution Place 1 drop into both eyes 2 (two) times daily as needed.    [provider]  loperamide (IMODIUM A-D) 2 MG tablet Take 2 mg by mouth 4 (four) times daily as needed for diarrhea or loose stools. Give 4mg  po prn loose stools. May repeat 2mg  po tid for additional loose stools    [provider]  ondansetron (ZOFRAN) 4 MG tablet Take 4 mg by mouth every 8 (eight) hours as needed for nausea or vomiting.    [provider]  pantoprazole (PROTONIX) 40 MG tablet Take 1 tablet (40 mg total) by mouth 2 (two) times daily. 05/12/14   Burnard Bunting, MD  Phenol [facility]) Use as directed in the mouth or throat. One spray to throat every 2 hours as needed for sore throad    [provider]  polyethylene glycol (MIRALAX / GLYCOLAX) packet Take 17 g by mouth at bedtime.    [provider]  promethazine (PHENERGAN) 25 MG/ML injection Inject 25 mg into the muscle every 6 (six) hours as needed for nausea or vomiting. For nausea and vomiting    [provider]  saccharomyces boulardii (FLORASTOR) 250 MG capsule Take 250 mg by mouth 2 (two) times daily.    [provider]  senna (SENOKOT) 8.6 MG tablet Take 1 tablet by mouth at bedtime.    [provider]  sucralfate (CARAFATE) 1 g  tablet Take 1 g by mouth 2 (two) times daily.    [provider]  triamcinolone cream (KENALOG) 0.1 % Apply 1 application topically 2 (two) times daily.    [provider]    Allergies    Patient has no known allergies.  Review of Systems   Review of Systems  Constitutional: Negative for chills, diaphoresis, fatigue and fever.  Eyes: Negative for visual disturbance.  Respiratory: Positive for choking and shortness of breath. Negative for cough, sputum production, chest tightness, wheezing and stridor.   Cardiovascular: Negative for chest pain and palpitations.  Gastrointestinal: Negative for abdominal pain and vomiting.  Musculoskeletal: Negative for back pain.  Neurological: Negative for headaches.  Psychiatric/Behavioral: Negative for agitation.  All other systems reviewed and are negative.  Physical Exam Updated Vital Signs BP (!) 147/93   Pulse 96   Temp (!) 97 F (36.1 C) (Oral)   Resp (!) 38   SpO2 (!) 82%   Physical Exam Vitals and nursing note reviewed.  Constitutional:      General: She is in acute distress.     Appearance: She is well-developed. She is not ill-appearing, toxic-appearing or diaphoretic.  HENT:     Head: Normocephalic and atraumatic.     Nose: Nose normal.  Eyes:     Conjunctiva/sclera: Conjunctivae normal.  Neck:     Vascular: No carotid bruit.  Cardiovascular:     Rate and Rhythm: Normal rate and regular rhythm.     Pulses: Normal pulses.     Heart sounds: No murmur heard.   Pulmonary:     Effort: Respiratory distress present.     Breath sounds: No stridor. No wheezing, rhonchi or rales.  Chest:     Chest wall: No tenderness.  Abdominal:     General: Abdomen is flat. There is no distension.     Palpations: Abdomen is soft.     Tenderness: There is no abdominal tenderness. There is no right CVA tenderness or left CVA tenderness.  Musculoskeletal:        General: No tenderness.     Cervical back: Neck supple. No  tenderness.  Skin:    General: Skin is warm and dry.     Capillary Refill: Capillary refill takes less than 2 seconds.     Findings: No erythema.  Neurological:     General: No focal deficit present.     Mental Status: She is alert.  Psychiatric:        Mood and Affect: Mood normal.     ED Results / Procedures / Treatments   Labs (all labs ordered are listed, but only abnormal results are displayed) Labs Reviewed  CBC WITH DIFFERENTIAL/PLATELET - Abnormal; Notable for the following components:      Result Value   WBC 11.5 (*)    HCT 47.1 (*)    Neutro Abs 9.2 (*)    Abs Immature Granulocytes 0.12 (*)    All other components within normal limits  BASIC METABOLIC PANEL - Abnormal; Notable for the following components:   Glucose, Bld 203 (*)    All other components within normal limits  RESPIRATORY PANEL BY RT PCR (FLU A&B, COVID)  CBC  CREATININE, SERUM  CBC  BASIC METABOLIC PANEL  MAGNESIUM    EKG None  Radiology CT Chest Wo Contrast  Result Date: 04/26/2020 CLINICAL DATA:  Respiratory failure Hypoxia, choking, concern for aspiration. No abdominal pain or chest pain EXAM: CT CHEST WITHOUT CONTRAST TECHNIQUE: Multidetector CT imaging of the chest was performed following the standard protocol without IV contrast. COMPARISON:  Chest radiograph earlier today. Included portions from abdominal CT 05/10/2014 FINDINGS: Cardiovascular: Aortic atherosclerosis and tortuosity. No aortic aneurysm. Heart is normal in size, displaced anteriorly by hiatal hernia. Trace pericardial fluid. Mediastinum/Nodes: Moderately large hiatal hernia. The stomach both above and below the diaphragm are dilated and fluid-filled. The thoracic esophagus is dilated and fluid-filled to the level of the thoracic inlet. The left thyroid gland is markedly enlarged measuring 6.1 x 4.3 x 6.5 cm and heterogeneous, extending substernal in the left. This causes mild rightward tracheal deviation. Right lobe of the  thyroid gland is not well-defined. Scattered small mediastinal lymph nodes, motion artifact and lack contrast limits detailed assessment. Lungs/Pleura: Dense consolidation involving the dependent right and  left lower lobes. Additional patchy opacities in the dependent right middle, right upper, and left upper lobe. There is no debris within the trachea or central bronchi. Possible trace pleural effusions/thickening. Occasional dependent parenchymal calcifications within the consolidated lung. Upper Abdomen: Distended stomach with air-fluid level, partially included. Musculoskeletal: The bones are diffusely under mineralized. Exaggerated thoracic kyphosis with diffuse degenerative change in the spine. No acute osseous abnormalities are seen. IMPRESSION: 1. Dense consolidation involving the dependent right and left lower lobes. Additional patchy opacities in the dependent right middle, right upper, and left upper lobes. Findings most consistent with pneumonia, including aspiration. There is no concurrent debris within the trachea or central bronchi. 2. Moderately large hiatal hernia. Both the intrathoracic and subdiaphragmatic stomach are dilated and fluid-filled. The esophagus is dilated and fluid-filled to the level of thoracic inlet. This could predispose to aspiration. 3. Markedly enlarged left thyroid gland with substernal extension, causing mild rightward tracheal deviation. Recommend thyroid ultrasound, unless there are significant comorbidities or limited life expectancy, then no follow-up is recommended. (Ref: J Am Coll Radiol. 2015 Feb;12(2): 143-50). Aortic Atherosclerosis (ICD10-I70.0). Electronically Signed   By: Keith Rake M.D.   On: 04/26/2020 22:53   DG Chest Portable 1 View  Result Date: 04/26/2020 CLINICAL DATA:  Choking, short of breath with decreased O2 sats EXAM: PORTABLE CHEST 1 VIEW COMPARISON:  No recent priors are available for comparison FINDINGS: Image rotated to the RIGHT and semi  erect positioning limits assessment. Accounting for this cardiomediastinal contours are unremarkable. There is added density at the RIGHT lung apex and patchy opacities as well as increased interstitial markings throughout both the RIGHT and LEFT chest. Fullness of the LEFT hilum may relate to degree of rotation. No sign of pleural effusion. Lucency beneath the LEFT hemidiaphragm tracks across the midline. There is extensive lucency beneath the LEFT hemidiaphragm. Osteopenia without acute bone process. IMPRESSION: 1. Question of patchy airspace opacities. There is some fullness of the LEFT hilum. Airways not well assessed on the current study. CT of the chest may be helpful for further assessment. 2. Interstitial and airspace markings throughout the RIGHT and LEFT chest. This may represent edema or atypical pneumonia. 3. Findings suspicious for free air in the upper abdomen marked gastric distension could potentially give this appearance though this crosses the midline on the current exam. This can be further assessed with CT. These results were called by telephone at the time of interpretation on 04/26/2020 at 9:16 pm to provider [facility] , who verbally acknowledged these results. Electronically Signed   By: Zetta Bills M.D.   On: 04/26/2020 21:16    Procedures Procedures (including critical care time)  CRITICAL CARE Performed by: Gwenyth Allegra Astella Desir Total critical care time: 35 minutes Critical care time was exclusive of separately billable procedures and treating other patients. Critical care was necessary to treat or prevent imminent or life-threatening deterioration. Critical care was time spent personally by me on the following activities: development of treatment plan with patient and/or surrogate as well as nursing, discussions with consultants, evaluation of patient's response to treatment, examination of patient, obtaining history from patient or surrogate, ordering and performing  treatments and interventions, ordering and review of laboratory studies, ordering and review of radiographic studies, pulse oximetry and re-evaluation of patient's condition.   Medications Ordered in ED Medications  docusate sodium (COLACE) capsule 100 mg (has no administration in time range)  polyethylene glycol (MIRALAX / GLYCOLAX) packet 17 g (has no administration in time range)  enoxaparin (  LOVENOX) injection 40 mg (40 mg Subcutaneous Given 04/26/20 2329)  lactated ringers infusion ( Intravenous New Bag/Given 04/26/20 2335)  acetaminophen (TYLENOL) tablet 650 mg (has no administration in time range)  albuterol (PROVENTIL) (2.5 MG/3ML) 0.083% nebulizer solution 2.5 mg (has no administration in time range)  piperacillin-tazobactam (ZOSYN) IVPB 3.375 g (3.375 g Intravenous New Bag/Given 04/26/20 2338)  ondansetron (ZOFRAN) injection 4 mg (4 mg Intravenous Given 04/26/20 2032)    ED Course  I have reviewed the triage vital signs and the nursing notes.  Pertinent labs & imaging results that were available during my care of the patient were reviewed by me and considered in my medical decision making (see chart for details).    MDM Rules/Calculators/A&P                          KEEARA FREES is a 84 y.o. female with a past medical history significant for cerebral palsy, constipation, hyperlipidemia, prior GI bleeds, and prior dysphagia who presents with choking and shortness breath with hypoxia.  Patient reports that just prior to arrival, she was eating dinner and was eating some form of meat stuffed pepper.  She reports that she choked on a piece of it.  She then has been extremely short of breath.  She denies any chest pain or throat pain.  She does not feel it blocking her from swallowing and she is swallowed since then.  She is tolerating her secretions.  She says that her she is very short of breath and EMS found her oxygen saturation to be in the 70s.  She was placed on 15 L nonrebreather and  her oxygen saturations have been in the 80s to very low 90s on 15 L nonrebreather.  She is mentating well and answering questions appropriately.  She denied any symptoms of before choking episode.  She denies any fevers, chills, congestion, or cough beforehand.  She reports some nausea but has not vomited.  Denies other complaints.  On exam, patient's breath sounds are symmetric without any rhonchi or wheezing.  No stridor.  Chest and abdomen nontender.  Patient is tachypneic and oxygen saturations are in the 80s.  She is alert and oriented in describing her episode without difficulty.  Clinically I am concerned she aspirated this piece of stuffed pepper.  As she is not having difficulty swallowing, she is on her secretions, I do not suspect it is in the esophagus.  I suspect it is in her airway..  Patient is DNR per the chart however I asked her and she does want this to be removed.  Critical care was called immediately after arrival to discuss possible management and bronc.  Anticipate following up on critical care recommendations.  We will go ahead and get a Covid test as well.  We will get a portable chest x-ray although I have low suspicion for pneumothorax at this time.  10:19 PM Critical care came to the patient and they would like to try BiPAP.  If she tolerates BiPAP, they will take her to the ICU.  She does not tolerate BiPAP, they feel she needs to go down to the floor as she told them she does not want to be intubated.  Covid test returned negative.  They recommended getting a CT scan which was ordered to further evaluate.  Patient will be admitted to ICU for further management.  Final Clinical Impression(s) / ED Diagnoses Final diagnoses:  Choking episode  Hypoxia  Clinical Impression: 1. Choking episode   2. Hypoxia     Disposition: Admit  This note was prepared with assistance of Dragon voice recognition software. Occasional wrong-word or sound-a-like substitutions may  have occurred due to the inherent limitations of voice recognition software.      Daun Rens, Gwenyth Allegra, MD 04/26/20 [phone]

## 2020-04-26 NOTE — Consult Note (Signed)
NAME:  [name], MRN:  [id number], DOB:  12-13-1935, LOS: 0 ADMISSION DATE:  04/26/2020, CONSULTATION DATE: 04/26/2020 REFERRING MD: Zacarias Pontes, ED, CHIEF COMPLAINT: Aspiration  Brief History   Patient is an 84 year old female status post aspiration  History of present illness   Patient is an 84 year old female with a history of cerebral palsy currently under palliative care who says that earlier this evening she did aspirate solids.  It is unclear as to how much saliva she may have aspirated or liquids.  She presented to the emergency room with O2 saturation 70% and on my evaluation is breathing anywhere from 20 breaths an hour up to the mid 30s.  Her O2 saturation is 100% nonrebreather is 89%.  Chest x-ray shows diffuse increased interstitial markings without a previous film to compare.  There is no clear evidence of atelectasis is suggesting impacted food.  Patient does states she feels nauseated.    Patient tells me she does not want to be intubated she is unclear to BiPAP.  Patient tolerated a trial of BiPAP in the emergency room and being a new BiPAP start with marginal oxygenation she will need to be moved to the ICU.  On BiPAP she is on 70% FiO2.  There is a progress note from [facility] care palliative care in which she is listed as a DNR.  She is Covid negative  Past Medical History   Cerebral palsy, unspecified ([place])   Dysphagia, oral phase   Dysphagia, oropharyngeal phase   Other symptoms and signs involving the nervous system   Abnormal posture   Cognitive communication deficit   Pain in right knee   Age-related osteoporosis without current pathological fracture   Other lack of coordination   Significant Hospital Events   Admission 04/26/2020  Consults:  PCCM  Procedures:  NA  Significant Diagnostic Tests:  White count is 11  Micro Data:  Pending  Antimicrobials:  Empiric Zosyn  Interim history/subjective:  NA  Objective   Blood pressure (!) 147/93,  pulse 96, temperature (!) 97 F (36.1 C), temperature source Oral, resp. rate (!) 38, SpO2 (!) 82 %.       No intake or output data in the 24 hours ending 04/26/20 2137 There were no vitals filed for this visit.  Examination: General: Frail female in moderate respiratory distress HENT: Within normal limits Lungs: Diffuse mild to moderate coarse breath sounds Cardiovascular: Regular rate and rhythm Abdomen: Benign bowel sounds positive Extremities: Extremities within normal limits Neuro: Awake alert nonfocal GU: N/A  Resolved Hospital Problem list   NA  Assessment & Plan:  1.  Probable aspiration pneumonitis without evidence of atelectasis suggesting impacted food: We will start Zosyn  2.  Cerebral palsy with general debilitation  3.  DNR    Best practice:  Diet: N.p.o. for now Pain/Anxiety/Delirium protocol (if indicated): N/A VAP protocol (if indicated): N/A DVT prophylaxis: Lovenox GI prophylaxis: N/A Glucose control: Monitor Mobility: Bedrest Code Status: DNR Family Communication: We will attempt to reach Disposition:   Labs   CBC: Recent Labs  Lab 04/26/20 2035  WBC 11.5*  NEUTROABS 9.2*  HGB 14.6  HCT 47.1*  MCV 94.8  PLT 425    Basic Metabolic Panel: Recent Labs  Lab 04/26/20 2035  NA 144  K 4.2  CL 109  CO2 23  GLUCOSE 203*  BUN 12  CREATININE 0.78  CALCIUM 9.2   GFR: CrCl cannot be calculated (Unknown ideal weight.). Recent Labs  Lab 04/26/20 2035  WBC 11.5*    Liver Function Tests: No results for input(s): AST, ALT, ALKPHOS, BILITOT, PROT, ALBUMIN in the last 168 hours. No results for input(s): LIPASE, AMYLASE in the last 168 hours. No results for input(s): AMMONIA in the last 168 hours.  ABG No results found for: PHART, PCO2ART, PO2ART, HCO3, TCO2, ACIDBASEDEF, O2SAT   Coagulation Profile: No results for input(s): INR, PROTIME in the last 168 hours.  Cardiac Enzymes: No results for input(s): CKTOTAL, CKMB, CKMBINDEX,  TROPONINI in the last 168 hours.  HbA1C: No results found for: HGBA1C  CBG: No results for input(s): GLUCAP in the last 168 hours.  Review of Systems:   Other than that mentioned in history of present illness unremarkable  Past Medical History  She,  has a past medical history of Cerebral palsy ([place]), Constipation, and Hyperlipidemia.   Surgical History    Past Surgical History:  Procedure Laterality Date  . ESOPHAGOGASTRODUODENOSCOPY  01/28/2012   Procedure: ESOPHAGOGASTRODUODENOSCOPY (EGD);  Surgeon: Lear Ng, MD;  Location: [facility] ENDOSCOPY;  Service: Endoscopy;  Laterality: N/A;  . ESOPHAGOGASTRODUODENOSCOPY N/A 05/10/2014   Procedure: ESOPHAGOGASTRODUODENOSCOPY (EGD);  Surgeon: Missy Sabins, MD;  Location: [facility] ENDOSCOPY;  Service: Endoscopy;  Laterality: N/A;  . right hip surgery    . TONSILLECTOMY       Social History   reports that she has never smoked. She has never used smokeless tobacco. She reports that she does not drink alcohol and does not use drugs.   Family History   Her family history is not on file.   Allergies No Known Allergies   Home Medications  Prior to Admission medications   Medication Sig Start Date End Date Taking? Authorizing Provider  albuterol (2.5 MG/3ML) 0.083% NEBU 3 mL, albuterol (5 MG/ML) 0.5% NEBU 0.5 mL Inhale 2.5 mg into the lungs every 6 (six) hours. Albuterol sul 2.5mg /11ml solution q6hr prn wheezing    [provider]  atorvastatin (LIPITOR) 10 MG tablet Take 10 mg by mouth daily.    [provider]  bisacodyl (DULCOLAX) 10 MG suppository Place 10 mg rectally every 3 (three) days.    [provider]  busPIRone (BUSPAR) 15 MG tablet Take 15 mg by mouth 3 (three) times daily.    [provider]  cholecalciferol (VITAMIN D) 1000 UNITS tablet Take 2,000 Units by mouth daily.    [provider]  citalopram (CELEXA) 40 MG tablet Take 40 mg by mouth daily.     [provider]    Cranberry 450 MG CAPS Take 1 capsule by mouth 2 (two) times daily.    [provider]  diazepam (VALIUM) 2 MG tablet Take 2 mg by mouth 2 (two) times daily. 1/2  tab (81mf) twice a day for muscle spasms    [provider]  diclofenac sodium (VOLTAREN) 1 % GEL Apply 2 g topically 3 (three) times daily as needed.    [provider]  diphenhydrAMINE (BENADRYL) 25 mg capsule Take 25 mg by mouth every 8 (eight) hours as needed for itching.    [provider]  docusate sodium (COLACE) 100 MG capsule Take 100 mg by mouth daily.     [provider]  ferrous sulfate 220 (44 Fe) MG/5ML solution Take 220 mg by mouth at bedtime.    [provider]  fluticasone (FLONASE) 50 MCG/ACT nasal spray Place 1 spray into both nostrils daily.    [provider]  guaifenesin (ROBITUSSIN) 100 MG/5ML syrup Take 200 mg by  mouth 4 (four) times daily as needed for cough.    [provider]  HYDROcodone-acetaminophen (NORCO/VICODIN) 5-325 MG per tablet Take 1 tablet by mouth 2 (two) times daily.     [provider]  HYDROcodone-acetaminophen (NORCO/VICODIN) 5-325 MG tablet Take 1 tablet by mouth daily as needed for moderate pain. One tablet by mouth daily at lunch time as needed for breakthrough pain    [provider]  hydrocortisone cream 1 % Apply 1 application topically 2 (two) times daily as needed for itching. Apply to affected areas twice daily as needed for itching    [provider]  ketoconazole (NIZORAL) 2 % cream Apply 1 application topically daily. Shampoo hair twice weekly for treatment of dry scalp    [provider]  ketotifen (ZADITOR) 0.025 % ophthalmic solution Place 1 drop into both eyes 2 (two) times daily as needed.    [provider]  loperamide (IMODIUM A-D) 2 MG tablet Take 2 mg by mouth 4 (four) times daily as needed for diarrhea or loose stools. Give 4mg  po prn loose stools. May repeat 2mg   po tid for additional loose stools    [provider]  ondansetron (ZOFRAN) 4 MG tablet Take 4 mg by mouth every 8 (eight) hours as needed for nausea or vomiting.    [provider]  pantoprazole (PROTONIX) 40 MG tablet Take 1 tablet (40 mg total) by mouth 2 (two) times daily. 05/12/14   Burnard Bunting, MD  Phenol [facility] MT) Use as directed in the mouth or throat. One spray to throat every 2 hours as needed for sore throad    [provider]  polyethylene glycol (MIRALAX / GLYCOLAX) packet Take 17 g by mouth at bedtime.    [provider]  promethazine (PHENERGAN) 25 MG/ML injection Inject 25 mg into the muscle every 6 (six) hours as needed for nausea or vomiting. For nausea and vomiting    [provider]  saccharomyces boulardii (FLORASTOR) 250 MG capsule Take 250 mg by mouth 2 (two) times daily.    [provider]  senna (SENOKOT) 8.6 MG tablet Take 1 tablet by mouth at bedtime.    [provider]  sucralfate (CARAFATE) 1 g tablet Take 1 g by mouth 2 (two) times daily.    [provider]  triamcinolone cream (KENALOG) 0.1 % Apply 1 application topically 2 (two) times daily.    [provider]     Critical care time: Over 35 minutes was spent bedside evaluation chart review and critical care planning

## 2020-04-26 NOTE — ED Notes (Signed)
CCM at bedside, pt to be put on bipap and to be reevaluated in 30 minutes

## 2020-04-27 ENCOUNTER — Inpatient Hospital Stay (HOSPITAL_COMMUNITY): Payer: Medicare Other

## 2020-04-27 DIAGNOSIS — J690 Pneumonitis due to inhalation of food and vomit: Principal | ICD-10-CM

## 2020-04-27 LAB — BASIC METABOLIC PANEL
Anion gap: 11 (ref 5–15)
BUN: 19 mg/dL (ref 8–23)
CO2: 23 mmol/L (ref 22–32)
Calcium: 8.8 mg/dL — ABNORMAL LOW (ref 8.9–10.3)
Chloride: 109 mmol/L (ref 98–111)
Creatinine, Ser: 0.82 mg/dL (ref 0.44–1.00)
GFR calc Af Amer: >60 mL/min (ref >60)
GFR calc non Af Amer: >60 mL/min (ref >60)
Glucose, Bld: 122 mg/dL — ABNORMAL HIGH (ref 70–99)
Potassium: 3.3 mmol/L — ABNORMAL LOW (ref 3.5–5.1)
Sodium: 143 mmol/L (ref 135–145)

## 2020-04-27 LAB — CBC
HCT: 46.4 % — ABNORMAL HIGH (ref 36.0–46.0)
HCT: 38.5 % (ref 36.0–46.0)
Hemoglobin: 14.4 g/dL (ref 12.0–15.0)
Hemoglobin: 12.1 g/dL (ref 12.0–15.0)
MCH: 29 pg (ref 26.0–34.0)
MCH: 28.9 pg (ref 26.0–34.0)
MCHC: 31 g/dL (ref 30.0–36.0)
MCHC: 31.4 g/dL (ref 30.0–36.0)
MCV: 93.4 fL (ref 80.0–100.0)
MCV: 91.9 fL (ref 80.0–100.0)
Platelets: 259 10*3/uL (ref 150–400)
Platelets: 194 10*3/uL (ref 150–400)
RBC: 4.97 MIL/uL (ref 3.87–5.11)
RBC: 4.19 MIL/uL (ref 3.87–5.11)
RDW: 13.4 % (ref 11.5–15.5)
RDW: 13.4 % (ref 11.5–15.5)
WBC: 3.6 10*3/uL — ABNORMAL LOW (ref 4.0–10.5)
WBC: 10 10*3/uL (ref 4.0–10.5)
nRBC: 0 % (ref 0.0–0.2)
nRBC: 0 % (ref 0.0–0.2)

## 2020-04-27 LAB — CREATININE, SERUM
Creatinine, Ser: 0.9 mg/dL (ref 0.44–1.00)
GFR calc Af Amer: >60 mL/min (ref >60)
GFR calc non Af Amer: 60 mL/min — ABNORMAL LOW (ref >60)

## 2020-04-27 LAB — MRSA PCR SCREENING: MRSA by PCR: NEGATIVE

## 2020-04-27 LAB — MAGNESIUM: Magnesium: 1.7 mg/dL (ref 1.7–2.4)

## 2020-04-27 MED ORDER — CHLORHEXIDINE GLUCONATE CLOTH 2 % EX PADS
6.0000 | MEDICATED_PAD | Freq: Every day | CUTANEOUS | Status: DC
  Administered 2020-04-27 – 2020-05-03 (×7): 6 via TOPICAL

## 2020-04-27 MED ORDER — GUAIFENESIN 100 MG/5ML PO SOLN
200.0000 mg | Freq: Four times a day (QID) | ORAL | Status: DC | PRN
  Administered 2020-04-30 – 2020-05-04 (×3): 200 mg via ORAL
  Filled 2020-04-27 (×4): qty 10

## 2020-04-27 MED ORDER — VITAMIN D 25 MCG (1000 UNIT) PO TABS
2000.0000 [IU] | ORAL_TABLET | Freq: Every day | ORAL | Status: DC
  Administered 2020-04-28 – 2020-05-06 (×9): 2000 [IU] via ORAL
  Filled 2020-04-27 (×9): qty 2

## 2020-04-27 MED ORDER — OLANZAPINE 2.5 MG PO TABS
1.2500 mg | ORAL_TABLET | Freq: Every day | ORAL | Status: DC
  Administered 2020-04-27 – 2020-05-05 (×9): 1.25 mg via ORAL
  Filled 2020-04-27 (×10): qty 0.5

## 2020-04-27 MED ORDER — SODIUM CHLORIDE 0.9 % IV BOLUS
500.0000 mL | Freq: Once | INTRAVENOUS | Status: AC
  Administered 2020-04-27: 500 mL via INTRAVENOUS

## 2020-04-27 MED ORDER — CITALOPRAM HYDROBROMIDE 40 MG PO TABS
40.0000 mg | ORAL_TABLET | Freq: Every day | ORAL | Status: DC
  Administered 2020-04-28 – 2020-05-06 (×9): 40 mg via ORAL
  Filled 2020-04-27 (×10): qty 1

## 2020-04-27 MED ORDER — DOCUSATE SODIUM 100 MG PO CAPS
100.0000 mg | ORAL_CAPSULE | Freq: Every day | ORAL | Status: DC
  Administered 2020-04-28 – 2020-05-06 (×9): 100 mg via ORAL
  Filled 2020-04-27 (×9): qty 1

## 2020-04-27 MED ORDER — CHLORHEXIDINE GLUCONATE 0.12 % MT SOLN
15.0000 mL | Freq: Two times a day (BID) | OROMUCOSAL | Status: DC
  Administered 2020-04-27 – 2020-05-06 (×18): 15 mL via OROMUCOSAL
  Filled 2020-04-27 (×17): qty 15

## 2020-04-27 MED ORDER — POTASSIUM CHLORIDE 20 MEQ PO PACK
40.0000 meq | PACK | Freq: Two times a day (BID) | ORAL | Status: AC
  Administered 2020-04-27 (×2): 40 meq via ORAL
  Filled 2020-04-27 (×2): qty 2

## 2020-04-27 MED ORDER — HYDROCODONE-ACETAMINOPHEN 5-325 MG PO TABS
1.0000 | ORAL_TABLET | Freq: Every day | ORAL | Status: DC | PRN
  Administered 2020-04-30: 1 via ORAL

## 2020-04-27 MED ORDER — BISACODYL 10 MG RE SUPP
10.0000 mg | RECTAL | Status: DC
  Administered 2020-04-28 – 2020-05-01 (×2): 10 mg via RECTAL
  Filled 2020-04-27 (×2): qty 1

## 2020-04-27 MED ORDER — ALBUTEROL SULFATE (2.5 MG/3ML) 0.083% IN NEBU
2.5000 mg | INHALATION_SOLUTION | Freq: Four times a day (QID) | RESPIRATORY_TRACT | Status: DC
  Administered 2020-04-27: 2.5 mg via RESPIRATORY_TRACT

## 2020-04-27 MED ORDER — PANTOPRAZOLE SODIUM 40 MG PO TBEC
40.0000 mg | DELAYED_RELEASE_TABLET | Freq: Two times a day (BID) | ORAL | Status: DC
  Administered 2020-04-27 – 2020-05-06 (×18): 40 mg via ORAL
  Filled 2020-04-27 (×18): qty 1

## 2020-04-27 MED ORDER — DIAZEPAM 2 MG PO TABS
1.0000 mg | ORAL_TABLET | Freq: Two times a day (BID) | ORAL | Status: DC
  Administered 2020-04-27 – 2020-05-06 (×18): 1 mg via ORAL
  Filled 2020-04-27 (×18): qty 1

## 2020-04-27 MED ORDER — BUSPIRONE HCL 15 MG PO TABS
15.0000 mg | ORAL_TABLET | Freq: Three times a day (TID) | ORAL | Status: DC
  Administered 2020-04-27 – 2020-05-06 (×26): 15 mg via ORAL
  Filled 2020-04-27 (×26): qty 1

## 2020-04-27 MED ORDER — LOPERAMIDE HCL 2 MG PO CAPS
2.0000 mg | ORAL_CAPSULE | Freq: Four times a day (QID) | ORAL | Status: DC | PRN
  Filled 2020-04-27: qty 1

## 2020-04-27 MED ORDER — CRANBERRY 450 MG PO CAPS: 1.0000 | ORAL_CAPSULE | Freq: Two times a day (BID) | ORAL | Status: DC

## 2020-04-27 MED ORDER — DIPHENHYDRAMINE HCL 25 MG PO CAPS: 25.0000 mg | ORAL_CAPSULE | Freq: Three times a day (TID) | ORAL | Status: DC | PRN

## 2020-04-27 MED ORDER — KETOROLAC TROMETHAMINE 30 MG/ML IJ SOLN
15.0000 mg | Freq: Once | INTRAMUSCULAR | Status: AC
  Administered 2020-04-27: 15 mg via INTRAVENOUS
  Filled 2020-04-27: qty 1

## 2020-04-27 MED ORDER — HYDROCODONE-ACETAMINOPHEN 5-325 MG PO TABS
1.0000 | ORAL_TABLET | Freq: Two times a day (BID) | ORAL | Status: DC
  Administered 2020-04-27 – 2020-05-06 (×18): 1 via ORAL
  Filled 2020-04-27 (×18): qty 1

## 2020-04-27 MED ORDER — FLUTICASONE PROPIONATE 50 MCG/ACT NA SUSP
1.0000 | Freq: Every day | NASAL | Status: DC
  Administered 2020-04-28 – 2020-05-06 (×9): 1 via NASAL
  Filled 2020-04-27: qty 16

## 2020-04-27 MED ORDER — ORAL CARE MOUTH RINSE
15.0000 mL | Freq: Two times a day (BID) | OROMUCOSAL | Status: DC
  Administered 2020-04-27 – 2020-05-05 (×14): 15 mL via OROMUCOSAL

## 2020-04-27 MED ORDER — FERROUS SULFATE 300 (60 FE) MG/5ML PO SYRP
300.0000 mg | ORAL_SOLUTION | Freq: Every day | ORAL | Status: DC
  Administered 2020-04-27 – 2020-05-05 (×9): 300 mg via ORAL
  Filled 2020-04-27 (×10): qty 5

## 2020-04-27 MED ORDER — ALBUTEROL SULFATE (5 MG/ML) 0.5% IN NEBU: 2.5000 mg | INHALATION_SOLUTION | Freq: Four times a day (QID) | RESPIRATORY_TRACT | Status: DC

## 2020-04-27 MED ORDER — ATORVASTATIN CALCIUM 10 MG PO TABS
10.0000 mg | ORAL_TABLET | Freq: Every day | ORAL | Status: DC
  Administered 2020-04-28 – 2020-05-06 (×9): 10 mg via ORAL
  Filled 2020-04-27 (×9): qty 1

## 2020-04-27 MED ORDER — ONDANSETRON HCL 4 MG PO TABS: 4.0000 mg | ORAL_TABLET | Freq: Three times a day (TID) | ORAL | Status: DC | PRN

## 2020-04-27 NOTE — Progress Notes (Signed)
Pt's systolic BP 39Y-72W following NS 500cc Bolus. MD notified. Will continue to monitor. Pt remains alert and pleasant.

## 2020-04-27 NOTE — Progress Notes (Signed)
CSW spoke with [name] at [facility] - the patient can return to the facility once she is medically stable.  Madilyn Fireman, MSW, LCSW-A Transitions of Care  Clinical Social Worker  [facility] Emergency Departments  Medical ICU [phone]

## 2020-04-27 NOTE — Progress Notes (Signed)
Pt transported to 2m with no complications. 

## 2020-04-27 NOTE — Progress Notes (Signed)
NAME:  [name], MRN:  [id number], DOB:  21-May-1936, LOS: 1 ADMISSION DATE:  04/26/2020, CONSULTATION DATE: 04/26/2020 REFERRING MD: Zacarias Pontes, ED, CHIEF COMPLAINT: Aspiration  Brief History   Patient is an 84 year old female status post aspiration event leading to admission for pneumonitis  History of present illness   Patient is an 84 year old female with a history of cerebral palsy currently under palliative care who says that earlier this evening she did aspirate solids.  It is unclear as to how much saliva she may have aspirated or liquids.  She presented to the emergency room with O2 saturation 70% and RR was between  20-30 breaths per minute.  Her O2 saturation was 100% nonrebreather is 89%. She was admitted to ICU for BiPAP trial and placed on Zosyn for empiric treatment of aspiration pneumonia.   Past Medical History   Cerebral palsy, unspecified ([place])   Dysphagia, oral phase   Dysphagia, oropharyngeal phase   Other symptoms and signs involving the nervous system   Abnormal posture   Cognitive communication deficit   Pain in right knee   Age-related osteoporosis without current pathological fracture   Other lack of coordination   Significant Hospital Events   Admission 04/26/2020  Consults:  PCCM  Procedures:  NA  Significant Diagnostic Tests:  WBC 11  Micro Data:  MRSA NP swab - Negative Covid/Flu - Negative  Antimicrobials:  D/c Zosyn  Interim history/subjective:  States that she is feeling much better than yesterday, but reports headache for which nursing is giving tylenol. Denies SOB, dysuria, N/V, tingling in extremities. She denies SOB, chest pain or fevers before this aspiration event.  Objective   Blood pressure 105/66, pulse 81, temperature 98.3 F (36.8 C), temperature source Oral, resp. rate 17, SpO2 93 %.    FiO2 (%):  [100 %] 100 %  No intake or output data in the 24 hours ending 04/27/20 1120 There were no vitals filed for this  visit.  Examination: General: Frail female in no apparent distress HENT: PERRLA, dry mucous membranes Lungs: Diffuse mild coarse breath sounds without wheezing Cardiovascular: Regular rate and rhythm, S1 S2, 2+ radial and DP pulse btl Abdomen: Normoactive bowel sounds, NTND Extremities: No edema noted, WWP Neuro: Awake alert nonfocal  Resolved Hospital Problem list   NA  Assessment & Plan:  1.  Aspiration pneumonitis without evidence of atelectasis: Patient reported she does not want to be intubated. Patient tolerated a trial of BiPAP overnight, but will likely not need it moving forward as this likely was not indicated with her history of dysphagia, and the positive pressure if anything would push oral secretions down. CT shows areas of opacity consistent with pneumonia or pneumonitis, a she has improved clinically with O2 sat 94 on 10L HFNC and afebrile, may treat as sterile aspiration event. - D/c antibiotics - Reduce O2 supplementation as tolerated - Transfer to floor  2.  Chronic dysphagia: Niece reports multiple events of choking in the past, Swallow study from 5/19 shows no significant risk of aspiration but prolonged, functional mastication and minimal residue in valleculae. As she is DNR and at an advanced age with mild cognitive decline, she is unlikely to need any strict diet precautions or NG tube. - Dysphagia 3 diet with assistance  3. Hypokalemia: K 3.3 today - Pot Chlor 40 meq BID today  4.  DNR   Best practice:  Diet: Dysphagia 3 Pain/Anxiety/Delirium protocol (if indicated): N/A VAP protocol (if indicated): N/A DVT prophylaxis: Lovenox GI  prophylaxis: N/A Glucose control: Monitor Mobility: Bedrest Code Status: DNR Family Communication: We will attempt to reach Disposition:   Labs   CBC: Recent Labs  Lab 04/26/20 2035 04/26/20 2226 04/27/20 0757  WBC 11.5* 3.6* 10.0  NEUTROABS 9.2*  --   --   HGB 14.6 14.4 12.1  HCT 47.1* 46.4* 38.5  MCV 94.8 93.4  91.9  PLT 287 259 235    Basic Metabolic Panel: Recent Labs  Lab 04/26/20 2035 04/26/20 2226 04/27/20 0757  NA 144  --  143  K 4.2  --  3.3*  CL 109  --  109  CO2 23  --  23  GLUCOSE 203*  --  122*  BUN 12  --  19  CREATININE 0.78 0.90 0.82  CALCIUM 9.2  --  8.8*  MG  --   --  1.7   GFR: CrCl cannot be calculated (Unknown ideal weight.). Recent Labs  Lab 04/26/20 2035 04/26/20 2226 04/27/20 0757  WBC 11.5* 3.6* 10.0    Liver Function Tests: No results for input(s): AST, ALT, ALKPHOS, BILITOT, PROT, ALBUMIN in the last 168 hours. No results for input(s): LIPASE, AMYLASE in the last 168 hours. No results for input(s): AMMONIA in the last 168 hours.  ABG No results found for: PHART, PCO2ART, PO2ART, HCO3, TCO2, ACIDBASEDEF, O2SAT   Coagulation Profile: No results for input(s): INR, PROTIME in the last 168 hours.  Cardiac Enzymes: No results for input(s): CKTOTAL, CKMB, CKMBINDEX, TROPONINI in the last 168 hours.  HbA1C: No results found for: HGBA1C  CBG: No results for input(s): GLUCAP in the last 168 hours.  Review of Systems:   Other than that mentioned in history of present illness unremarkable  Past Medical History  She,  has a past medical history of Cerebral palsy ([place]), Constipation, and Hyperlipidemia.   Surgical History    Past Surgical History:  Procedure Laterality Date  . ESOPHAGOGASTRODUODENOSCOPY  01/28/2012   Procedure: ESOPHAGOGASTRODUODENOSCOPY (EGD);  Surgeon: Lear Ng, MD;  Location: [facility] ENDOSCOPY;  Service: Endoscopy;  Laterality: N/A;  . ESOPHAGOGASTRODUODENOSCOPY N/A 05/10/2014   Procedure: ESOPHAGOGASTRODUODENOSCOPY (EGD);  Surgeon: Missy Sabins, MD;  Location: [name] ENDOSCOPY;  Service: Endoscopy;  Laterality: N/A;  . right hip surgery    . TONSILLECTOMY       Social History   reports that she has never smoked. She has never used smokeless tobacco. She reports that she does not drink alcohol and does not use drugs.    Family History   Her family history is not on file.   Allergies No Known Allergies   Home Medications  Prior to Admission medications   Medication Sig Start Date End Date Taking? Authorizing Provider  albuterol (2.5 MG/3ML) 0.083% NEBU 3 mL, albuterol (5 MG/ML) 0.5% NEBU 0.5 mL Inhale 2.5 mg into the lungs every 6 (six) hours. Albuterol sul 2.5mg /32ml solution q6hr prn wheezing    [provider]  atorvastatin (LIPITOR) 10 MG tablet Take 10 mg by mouth daily.    [provider]  bisacodyl (DULCOLAX) 10 MG suppository Place 10 mg rectally every 3 (three) days.    [provider]  busPIRone (BUSPAR) 15 MG tablet Take 15 mg by mouth 3 (three) times daily.    [provider]  cholecalciferol (VITAMIN D) 1000 UNITS tablet Take 2,000 Units by mouth daily.    [provider]  citalopram (CELEXA) 40 MG tablet Take 40 mg by mouth daily.     [provider]  Cranberry 450 MG CAPS Take 1 capsule by mouth 2 (two) times daily.    [provider]  diazepam (VALIUM) 2 MG tablet Take 2 mg by mouth 2 (two) times daily. 1/2  tab (93mf) twice a day for muscle spasms    [provider]  diclofenac sodium (VOLTAREN) 1 % GEL Apply 2 g topically 3 (three) times daily as needed.    [provider]  diphenhydrAMINE (BENADRYL) 25 mg capsule Take 25 mg by mouth every 8 (eight) hours as needed for itching.    [provider]  docusate sodium (COLACE) 100 MG capsule Take 100 mg by mouth daily.     [provider]  ferrous sulfate 220 (44 Fe) MG/5ML solution Take 220 mg by mouth at bedtime.    [provider]  fluticasone (FLONASE) 50 MCG/ACT nasal spray Place 1 spray into both nostrils daily.    [provider]  guaifenesin (ROBITUSSIN) 100 MG/5ML syrup Take 200 mg by mouth 4 (four) times daily as needed for cough.    [provider]  HYDROcodone-acetaminophen (NORCO/VICODIN) 5-325 MG per tablet  Take 1 tablet by mouth 2 (two) times daily.     [provider]  HYDROcodone-acetaminophen (NORCO/VICODIN) 5-325 MG tablet Take 1 tablet by mouth daily as needed for moderate pain. One tablet by mouth daily at lunch time as needed for breakthrough pain    [provider]  hydrocortisone cream 1 % Apply 1 application topically 2 (two) times daily as needed for itching. Apply to affected areas twice daily as needed for itching    [provider]  ketoconazole (NIZORAL) 2 % cream Apply 1 application topically daily. Shampoo hair twice weekly for treatment of dry scalp    [provider]  ketotifen (ZADITOR) 0.025 % ophthalmic solution Place 1 drop into both eyes 2 (two) times daily as needed.    [provider]  loperamide (IMODIUM A-D) 2 MG tablet Take 2 mg by mouth 4 (four) times daily as needed for diarrhea or loose stools. Give 4mg  po prn loose stools. May repeat 2mg  po tid for additional loose stools    [provider]  ondansetron (ZOFRAN) 4 MG tablet Take 4 mg by mouth every 8 (eight) hours as needed for nausea or vomiting.    [provider]  pantoprazole (PROTONIX) 40 MG tablet Take 1 tablet (40 mg total) by mouth 2 (two) times daily. 05/12/14   Burnard Bunting, MD  Phenol [facility] MT) Use as directed in the mouth or throat. One spray to throat every 2 hours as needed for sore throad    [provider]  polyethylene glycol (MIRALAX / GLYCOLAX) packet Take 17 g by mouth at bedtime.    [provider]  promethazine (PHENERGAN) 25 MG/ML injection Inject 25 mg into the muscle every 6 (six) hours as needed for nausea or vomiting. For nausea and vomiting    [provider]  saccharomyces boulardii (FLORASTOR) 250 MG capsule Take 250 mg by mouth 2 (two) times daily.    [provider]  senna (SENOKOT) 8.6 MG tablet Take 1 tablet by mouth at bedtime.    [provider]  sucralfate (CARAFATE) 1  g tablet Take 1 g by mouth 2 (two) times daily.    [provider]  triamcinolone cream (KENALOG) 0.1 % Apply 1 application topically 2 (two) times daily.    [provider]      Briant Cedar, MS4  Critical care attending  attestation note:  Patient seen and examined and relevant ancillary tests reviewed.  I agree with the assessment and plan of care as outlined by Briant Cedar, MS IV.  Synopsis of assessment and plan:  84 year old woman with CP who presents following aspiration event. Chronic dysphagia well documented but doesn't appear to have required many hospitalizations for aspiration.  She is followed by palliative care in the community.   Admitted for possible use of BiPAP but now weaning off O2. CT personally reviewed and showed bibasilar airspace disease compatible with aspiration but no food impaction.   On examination she is frail with diffuse muscle wasting, she has chronic contractures of neck and hands. Speech is dysarthric. Oral mucosa is moist. Chest clear anteriorly. HS normal, extremities warm. Abdomen is soft. No edema. Sensorium clear with no focal deficits.   Assessment:   Improving following aspiration event. Transfer held for hypotension while sleeping which appears to have improved with fluids.  Placed on dysphagia 3 diet. Would avoid repeat swallow evaluation as would not proceed with feeding tube long-term as would undoubtably affect quality of life.  DNR/DNI.   Kipp Brood, MD [facility] ICU Physician [place]  Pager: [phone] Mobile: [phone] After hours: [phone].  04/27/2020, 5:22 PM

## 2020-04-28 DIAGNOSIS — J690 Pneumonitis due to inhalation of food and vomit: Secondary | ICD-10-CM | POA: Diagnosis not present

## 2020-04-28 NOTE — Progress Notes (Signed)
PROGRESS NOTE    [name]  XIP:[id number]  DOB: 23-Dec-1935  DOA: 04/26/2020 PCP: Reynold Bowen, MD Outpatient Specialists:   Hospital course:  84 year old female with CP under palliative care with significant dysphagia and chronic hypoxic respiratory failure was admitted 04/26/2020 with acute hypoxia.  Chest x-ray shows diffuse increased interstitial markings which was thought to be secondary to aspiration pneumonia versus pneumonitis.  Patient was admitted to PCCM because she was very hypotensive and patient was started on BiPAP.  Patient did well with IV fluid resuscitation and patient was transitioned off BiPAP which was relatively contraindicated given severe dysphagia to 100% nonrebreather FM.  She was treated with Zosyn.  Patient is now transferred out of PCCM to floor.  Zosyn has been discontinued as this is thought to be a pneumonitis rather than actually a bacterial infection.  She is doing well on HFNC.  Subjective:  Patient feels she is doing okay.  Notes her shortness of breath is reasonably okay for now.   Objective: Vitals:   04/28/20 0612 04/28/20 0959 04/28/20 1010 04/28/20 1332  BP: 98/79  (!) 108/57 (!) 109/58  Pulse: 80  82 93  Resp: 17  16 16   Temp: 98 F (36.7 C)   98.7 F (37.1 C)  TempSrc: Oral   Oral  SpO2: 94% 93% 95% 93%    Intake/Output Summary (Last 24 hours) at 04/28/2020 1741 Last data filed at 04/28/2020 1500 Gross per 24 hour  Intake 1453.02 ml  Output 0 ml  Net 1453.02 ml   There were no vitals filed for this visit.   Exam:  General: Thin emaciated female sitting in bed with tachypnea but nonlabored breathing. Eyes: sclera anicteric, conjuctiva mild injection bilaterally CVS: S1-S2, regular  Respiratory: Markedly decreased breath sounds bilaterally secondary to decrease inspiratory effort. GI: NABS, soft, NT  LE: No edema.  Neuro:  grossly nonfocal.  Psych: patient is logical and coherent, judgement and insight appear normal,  mood and affect appropriate to situation.   Assessment & Plan:   Acute on chronic hypoxic respiratory failure Thought to be secondary to aspiration pneumonitis rather than bacterial infection Zosyn has been discontinued by PCCM. Continue HFNC and titrate as warranted for O2 sats greater than 92% Patient is DNR and declines intubation Avoid BiPAP given significant dysphagia  Chronic dysphagia Dysphagia 3 diet with assistance  Hypokalemia Was repleted yesterday, will recheck today  Hypomagnesemia 1.7 yesterday, will recheck and replete    DVT prophylaxis: Enoxaparin Code Status: DNR Family Communication: None Disposition Plan:   Patient is from: F  Anticipated Discharge Location: SNF  Barriers to Discharge: Acute hypoxia  Is patient medically stable for Discharge: No   Consultants:  PCCM  Procedures:  None  Antimicrobials:  None   Data Reviewed:  Basic Metabolic Panel: Recent Labs  Lab 04/26/20 2035 04/26/20 2226 04/27/20 0757  NA 144  --  143  K 4.2  --  3.3*  CL 109  --  109  CO2 23  --  23  GLUCOSE 203*  --  122*  BUN 12  --  19  CREATININE 0.78 0.90 0.82  CALCIUM 9.2  --  8.8*  MG  --   --  1.7   Liver Function Tests: No results for input(s): AST, ALT, ALKPHOS, BILITOT, PROT, ALBUMIN in the last 168 hours. No results for input(s): LIPASE, AMYLASE in the last 168 hours. No results for input(s): AMMONIA in the last 168 hours. CBC: Recent Labs  Lab 04/26/20  2035 04/26/20 2226 04/27/20 0757  WBC 11.5* 3.6* 10.0  NEUTROABS 9.2*  --   --   HGB 14.6 14.4 12.1  HCT 47.1* 46.4* 38.5  MCV 94.8 93.4 91.9  PLT 287 259 194   Cardiac Enzymes: No results for input(s): CKTOTAL, CKMB, CKMBINDEX, TROPONINI in the last 168 hours. BNP (last 3 results) No results for input(s): PROBNP in the last 8760 hours. CBG: No results for input(s): GLUCAP in the last 168 hours.  Recent Results (from the past 240 hour(s))  Respiratory Panel by RT PCR (Flu A&B,  Covid) - Nasopharyngeal Swab     Status: None   Collection Time: 04/26/20  8:35 PM   Specimen: Nasopharyngeal Swab  Result Value Ref Range Status   SARS Coronavirus 2 by RT PCR NEGATIVE NEGATIVE Final    Comment: (NOTE) SARS-CoV-2 target nucleic acids are NOT DETECTED.  The SARS-CoV-2 RNA is generally detectable in upper respiratoy specimens during the acute phase of infection. The lowest concentration of SARS-CoV-2 viral copies this assay can detect is 131 copies/mL. A negative result does not preclude SARS-Cov-2 infection and should not be used as the sole basis for treatment or other patient management decisions. A negative result may occur with  improper specimen collection/handling, submission of specimen other than nasopharyngeal swab, presence of viral mutation(s) within the areas targeted by this assay, and inadequate number of viral copies (<131 copies/mL). A negative result must be combined with clinical observations, patient history, and epidemiological information. The expected result is Negative.  Fact Sheet for Patients:  PinkCheek.be  Fact Sheet for Healthcare Providers:  GravelBags.it  This test is no t yet approved or cleared by the [place] FDA and  has been authorized for detection and/or diagnosis of SARS-CoV-2 by FDA under an Emergency Use Authorization (EUA). This EUA will remain  in effect (meaning this test can be used) for the duration of the COVID-19 declaration under Section 564(b)(1) of the Act, 21 U.S.C. section 360bbb-3(b)(1), unless the authorization is terminated or revoked sooner.     Influenza A by PCR NEGATIVE NEGATIVE Final   Influenza B by PCR NEGATIVE NEGATIVE Final    Comment: (NOTE) The Xpert Xpress SARS-CoV-2/FLU/RSV assay is intended as an aid in  the diagnosis of influenza from Nasopharyngeal swab specimens and  should not be used as a sole basis for treatment. Nasal  washings and  aspirates are unacceptable for Xpert Xpress SARS-CoV-2/FLU/RSV  testing.  Fact Sheet for Patients: PinkCheek.be  Fact Sheet for Healthcare Providers: GravelBags.it  This test is not yet approved or cleared by the [place] FDA and  has been authorized for detection and/or diagnosis of SARS-CoV-2 by  FDA under an Emergency Use Authorization (EUA). This EUA will remain  in effect (meaning this test can be used) for the duration of the  Covid-19 declaration under Section 564(b)(1) of the Act, 21  U.S.C. section 360bbb-3(b)(1), unless the authorization is  terminated or revoked. Performed at [facility], [place] [address] [place], [place] [address]   MRSA PCR Screening     Status: None   Collection Time: 04/27/20  6:52 AM   Specimen: Nasal Mucosa; Nasopharyngeal  Result Value Ref Range Status   MRSA by PCR NEGATIVE NEGATIVE Final    Comment:        The GeneXpert MRSA Assay (FDA approved for NASAL specimens only), is one component of a comprehensive MRSA colonization surveillance program. It is not intended to diagnose MRSA infection nor to guide or  monitor treatment for MRSA infections. Performed at [facility], [place] [address], [place], [place] [address]       Studies: CT Chest Wo Contrast  Result Date: 04/26/2020 CLINICAL DATA:  Respiratory failure Hypoxia, choking, concern for aspiration. No abdominal pain or chest pain EXAM: CT CHEST WITHOUT CONTRAST TECHNIQUE: Multidetector CT imaging of the chest was performed following the standard protocol without IV contrast. COMPARISON:  Chest radiograph earlier today. Included portions from abdominal CT 05/10/2014 FINDINGS: Cardiovascular: Aortic atherosclerosis and tortuosity. No aortic aneurysm. Heart is normal in size, displaced anteriorly by hiatal hernia. Trace pericardial fluid. Mediastinum/Nodes: Moderately large hiatal hernia. The stomach  both above and below the diaphragm are dilated and fluid-filled. The thoracic esophagus is dilated and fluid-filled to the level of the thoracic inlet. The left thyroid gland is markedly enlarged measuring 6.1 x 4.3 x 6.5 cm and heterogeneous, extending substernal in the left. This causes mild rightward tracheal deviation. Right lobe of the thyroid gland is not well-defined. Scattered small mediastinal lymph nodes, motion artifact and lack contrast limits detailed assessment. Lungs/Pleura: Dense consolidation involving the dependent right and left lower lobes. Additional patchy opacities in the dependent right middle, right upper, and left upper lobe. There is no debris within the trachea or central bronchi. Possible trace pleural effusions/thickening. Occasional dependent parenchymal calcifications within the consolidated lung. Upper Abdomen: Distended stomach with air-fluid level, partially included. Musculoskeletal: The bones are diffusely under mineralized. Exaggerated thoracic kyphosis with diffuse degenerative change in the spine. No acute osseous abnormalities are seen. IMPRESSION: 1. Dense consolidation involving the dependent right and left lower lobes. Additional patchy opacities in the dependent right middle, right upper, and left upper lobes. Findings most consistent with pneumonia, including aspiration. There is no concurrent debris within the trachea or central bronchi. 2. Moderately large hiatal hernia. Both the intrathoracic and subdiaphragmatic stomach are dilated and fluid-filled. The esophagus is dilated and fluid-filled to the level of thoracic inlet. This could predispose to aspiration. 3. Markedly enlarged left thyroid gland with substernal extension, causing mild rightward tracheal deviation. Recommend thyroid ultrasound, unless there are significant comorbidities or limited life expectancy, then no follow-up is recommended. (Ref: J Am Coll Radiol. 2015 Feb;12(2): 143-50). Aortic  Atherosclerosis (ICD10-I70.0). Electronically Signed   By: Keith Rake M.D.   On: 04/26/2020 22:53   DG Chest Port 1 View  Result Date: 04/27/2020 CLINICAL DATA:  Pneumonia EXAM: PORTABLE CHEST 1 VIEW COMPARISON:  04/26/2020 FINDINGS: Patient rotated to the right.  Aorta is tortuous. Question new area of airspace disease in the left midlung. Right lung clear.  No heart failure or effusion IMPRESSION: Possible new infiltrate in the left mid lung. This area is difficult to evaluate on the current study due to rotation. Recommend repeat chest x-ray preferably two-view chest x-ray if the patient is able to travel to the radiology department. Otherwise repeat portable x-ray. Electronically Signed   By: Franchot Gallo M.D.   On: 04/27/2020 15:59   DG Chest Portable 1 View  Result Date: 04/26/2020 CLINICAL DATA:  Choking, short of breath with decreased O2 sats EXAM: PORTABLE CHEST 1 VIEW COMPARISON:  No recent priors are available for comparison FINDINGS: Image rotated to the RIGHT and semi erect positioning limits assessment. Accounting for this cardiomediastinal contours are unremarkable. There is added density at the RIGHT lung apex and patchy opacities as well as increased interstitial markings throughout both the RIGHT and LEFT chest. Fullness of the LEFT hilum may relate to degree of rotation. No  sign of pleural effusion. Lucency beneath the LEFT hemidiaphragm tracks across the midline. There is extensive lucency beneath the LEFT hemidiaphragm. Osteopenia without acute bone process. IMPRESSION: 1. Question of patchy airspace opacities. There is some fullness of the LEFT hilum. Airways not well assessed on the current study. CT of the chest may be helpful for further assessment. 2. Interstitial and airspace markings throughout the RIGHT and LEFT chest. This may represent edema or atypical pneumonia. 3. Findings suspicious for free air in the upper abdomen marked gastric distension could potentially give  this appearance though this crosses the midline on the current exam. This can be further assessed with CT. These results were called by telephone at the time of interpretation on 04/26/2020 at 9:16 pm to provider [facility] , who verbally acknowledged these results. Electronically Signed   By: Zetta Bills M.D.   On: 04/26/2020 21:16     Scheduled Meds: . atorvastatin  10 mg Oral Daily  . bisacodyl  10 mg Rectal Q3 days  . busPIRone  15 mg Oral TID  . chlorhexidine  15 mL Mouth Rinse BID  . Chlorhexidine Gluconate Cloth  6 each Topical Daily  . cholecalciferol  2,000 Units Oral Daily  . citalopram  40 mg Oral Daily  . diazepam  1 mg Oral BID  . docusate sodium  100 mg Oral Daily  . enoxaparin (LOVENOX) injection  40 mg Subcutaneous QHS  . ferrous sulfate  300 mg Oral QHS  . fluticasone  1 spray Each Nare Daily  . HYDROcodone-acetaminophen  1 tablet Oral BID  . mouth rinse  15 mL Mouth Rinse q12n4p  . OLANZapine  1.25 mg Oral QHS  . pantoprazole  40 mg Oral BID   Continuous Infusions: . lactated ringers 50 mL/hr at 04/28/20 1884    Active Problems:   Aspiration pneumonia ([place])     Dewaine Oats Derek Jack, [facility]  If 7PM-7AM, please contact night-coverage [email or web address] Password [email or web address] 04/28/2020, 5:41 PM    LOS: 2 days

## 2020-04-28 NOTE — TOC Initial Note (Signed)
Transition of Care [facility]) - Initial/Assessment Note    Patient Details  Name: [name] MRN: [id number] Date of Birth: [birth year]  [facility] CM/SW Contact:    Alexander Mt, LCSW Phone Number: 04/28/2020, 3:43 PM  Clinical Narrative:                 CSW spoke with Wendall Stade at [phone]. Introduced self, role, reason for call. Tawni Carnes, and Katharine Look are all pt cousins. Langley Gauss and Shanon Brow are [employer] (paperwork in chart on Epic). She confirms pt has been living at [facility] for 8 years. Plan is to return when medically stable, [facility] aware and able to accept pt back with continued supportive care through [facility] when pt ready.   TOC team continues to follow. Will need a new COVID 24-48 hrs prior to d/c.   Expected Discharge Plan: Skilled Nursing Facility Barriers to Discharge: Continued Medical Work up   Patient Goals and CMS Choice Patient states their goals for this hospitalization and ongoing recovery are:: return to [facility] when able CMS Medicare.gov Compare Post Acute Care list provided to::  (LTC resident at [facility]) Choice offered to / list presented to : [facility] POA / Guardian (pt cousin Veterinary surgeon)  Expected Discharge Plan and Services Expected Discharge Plan: [place] In-house Referral: Clinical Social Work Discharge Planning Services: CM Consult Post Acute Care Choice: Resumption of Product/process development scientist, [place] Living arrangements for the past 2 months: [place]   Prior Living Arrangements/Services Living arrangements for the past 2 months: [place] Lives with:: Facility Resident Patient language and need for interpreter reviewed:: Yes (no needs) Do you feel safe going back to the place where you live?: Yes      Need for Family Participation in Patient Care: Yes (Comment) (supportive decision making ; daily cares) Care giver support system in place?: Yes (comment)  (cousins; facility staff) Current home services: Other (comment) (facility resident) Criminal Activity/Legal Involvement Pertinent to Current Situation/Hospitalization: No - Comment as needed  Permission Sought/Granted Permission sought to share information with : Family Supports Permission granted to share information with : No (fluctuating orientation)  Share Information with NAME: Wendall Stade  Permission granted to share info w AGENCY: [facility]  Permission granted to share info w Relationship: cousin  Permission granted to share info w Contact Information: [phone]  Emotional Assessment Appearance:: Other (Comment Required (telephone assessment w/ pt cousin) Attitude/Demeanor/Rapport: Other (comment) (telephone assessment w/ pt cousin) Affect (typically observed): Other (comment) (telephone assessment w/ pt cousin) Orientation: : Oriented to Self, Oriented to Place, Fluctuating Orientation (Suspected and/or reported Sundowners) Alcohol / Substance Use: Not Applicable Psych Involvement: No (comment)  Admission diagnosis:  Aspiration pneumonia ([place]) [J69.0] Hypoxia [R09.02] Choking episode [R09.89] Patient Active Problem List   Diagnosis Date Noted  . Aspiration pneumonia ([place]) 04/26/2020  . Dysphagia 11/30/2015  . Hiatal hernia with gastroesophageal reflux 05/10/2014  . Spastic paraplegia 05/06/2014  . C. difficile colitis 03/27/2012  . Colitis 03/26/2012  . Diarrhea 03/25/2012  . UTI (lower urinary tract infection) 03/25/2012  . Cerebral palsy ([place]) 03/25/2012  . H/O: GI bleed 03/25/2012  . Esophageal ulcer 01/29/2012  . Hypokalemia 01/29/2012  . Anemia 01/29/2012  . Acute upper GI bleed 01/28/2012  . Hematemesis 01/28/2012   PCP:  Reynold Bowen, MD Pharmacy:  No Pharmacies Listed   Readmission Risk Interventions Readmission Risk Prevention Plan 04/28/2020  Transportation Screening Complete  PCP or Specialist Appt within 5-7 Days Not Complete  Not  Complete  comments SNF resident  Home Care Screening Not Complete  Home Care Screening Not Completed Comments SNF resident  Medication Review (RN CM) Referral to Pharmacy  Some recent data might be hidden

## 2020-04-29 DIAGNOSIS — J690 Pneumonitis due to inhalation of food and vomit: Secondary | ICD-10-CM | POA: Diagnosis not present

## 2020-04-29 LAB — BASIC METABOLIC PANEL
Anion gap: 8 (ref 5–15)
BUN: 15 mg/dL (ref 8–23)
CO2: 24 mmol/L (ref 22–32)
Calcium: 8.6 mg/dL — ABNORMAL LOW (ref 8.9–10.3)
Chloride: 111 mmol/L (ref 98–111)
Creatinine, Ser: 0.54 mg/dL (ref 0.44–1.00)
GFR calc Af Amer: >60 mL/min (ref >60)
GFR calc non Af Amer: >60 mL/min (ref >60)
Glucose, Bld: 104 mg/dL — ABNORMAL HIGH (ref 70–99)
Potassium: 3.2 mmol/L — ABNORMAL LOW (ref 3.5–5.1)
Sodium: 143 mmol/L (ref 135–145)

## 2020-04-29 LAB — MAGNESIUM: Magnesium: 1.8 mg/dL (ref 1.7–2.4)

## 2020-04-29 MED ORDER — POTASSIUM CHLORIDE 20 MEQ PO PACK
40.0000 meq | PACK | Freq: Two times a day (BID) | ORAL | Status: AC
  Administered 2020-04-29 – 2020-04-30 (×2): 40 meq via ORAL
  Filled 2020-04-29 (×2): qty 2

## 2020-04-29 NOTE — Progress Notes (Signed)
PROGRESS NOTE    [name]  STM:[id number]  DOB: 02-Feb-1936  DOA: 04/26/2020 PCP: Reynold Bowen, MD Outpatient Specialists:   Hospital course:  84 year old female with CP under palliative care with significant dysphagia and chronic hypoxic respiratory failure was admitted 04/26/2020 with acute hypoxia.  Chest x-ray shows diffuse increased interstitial markings which was thought to be secondary to aspiration pneumonia versus pneumonitis.  Patient was admitted to PCCM because she was very hypotensive and patient was started on BiPAP.  Patient did well with IV fluid resuscitation and patient was transitioned off BiPAP which was relatively contraindicated given severe dysphagia to 100% nonrebreather FM.  She was treated with Zosyn.  Patient is now transferred out of PCCM to floor.  Zosyn has been discontinued as this is thought to be a pneumonitis rather than actually a bacterial infection.  She is doing well on HFNC.  Subjective:  Patient states that she is feeling much better today.  Notes that she is more awake, less sleepy, denies any difficulty breathing.  Objective: Vitals:   04/28/20 2107 04/29/20 0557 04/29/20 1000 04/29/20 1431  BP: (!) 97/45 (!) 100/51 128/67 (!) 104/52  Pulse: 85 88 82 67  Resp: 17 18 20 18   Temp: 98.9 F (37.2 C) 98.4 F (36.9 C) 98.9 F (37.2 C) 98.9 F (37.2 C)  TempSrc: Oral Oral Oral Oral  SpO2: (!) 88% 91%  94%    Intake/Output Summary (Last 24 hours) at 04/29/2020 1734 Last data filed at 04/29/2020 1312 Gross per 24 hour  Intake 557.5 ml  Output --  Net 557.5 ml   There were no vitals filed for this visit.   Exam:  General: Thin emaciated female lying in bed with contracted lower extremities much more awake and alert than she was yesterday. Eyes: sclera anicteric, conjuctiva mild injection bilaterally CVS: S1-S2, regular  Respiratory: Markedly decreased breath sounds bilaterally secondary to decrease inspiratory effort. GI: NABS,  soft, NT  LE: No edema.  Neuro:  grossly nonfocal.  Psych: patient is logical and coherent, judgement and insight appear normal, mood and affect appropriate to situation.   Assessment & Plan:   Acute on chronic hypoxic respiratory failure Patient improving with conservative management, she is much more awake and alert today. Patient is now down to 10 L on HFNC, she was on 14 L yesterday. Thought to be secondary to aspiration pneumonitis rather than bacterial infection Zosyn has been discontinued by PCCM. Continue HFNC and titrate down as warranted for O2 sats greater than 92% Patient is DNR and declines intubation Avoid BiPAP given significant dysphagia  Chronic dysphagia Dysphagia 3 diet with assistance  Hypokalemia Potassium still low despite getting supplementation Will replete and recheck in the morning  Hypomagnesemia Normalized with repletion yesterday    DVT prophylaxis: Enoxaparin Code Status: DNR Family Communication: None Disposition Plan:   Patient is from: F  Anticipated Discharge Location: SNF  Barriers to Discharge: Acute hypoxia  Is patient medically stable for Discharge: No   Consultants:  PCCM  Procedures:  None  Antimicrobials:  None   Data Reviewed:  Basic Metabolic Panel: Recent Labs  Lab 04/26/20 2035 04/26/20 2226 04/27/20 0757 04/29/20 0204  NA 144  --  143 143  K 4.2  --  3.3* 3.2*  CL 109  --  109 111  CO2 23  --  23 24  GLUCOSE 203*  --  122* 104*  BUN 12  --  19 15  CREATININE 0.78 0.90 0.82 0.54  CALCIUM 9.2  --  8.8* 8.6*  MG  --   --  1.7 1.8   Liver Function Tests: No results for input(s): AST, ALT, ALKPHOS, BILITOT, PROT, ALBUMIN in the last 168 hours. No results for input(s): LIPASE, AMYLASE in the last 168 hours. No results for input(s): AMMONIA in the last 168 hours. CBC: Recent Labs  Lab 04/26/20 2035 04/26/20 2226 04/27/20 0757  WBC 11.5* 3.6* 10.0  NEUTROABS 9.2*  --   --   HGB 14.6 14.4 12.1    HCT 47.1* 46.4* 38.5  MCV 94.8 93.4 91.9  PLT 287 259 194   Cardiac Enzymes: No results for input(s): CKTOTAL, CKMB, CKMBINDEX, TROPONINI in the last 168 hours. BNP (last 3 results) No results for input(s): PROBNP in the last 8760 hours. CBG: No results for input(s): GLUCAP in the last 168 hours.  Recent Results (from the past 240 hour(s))  Respiratory Panel by RT PCR (Flu A&B, Covid) - Nasopharyngeal Swab     Status: None   Collection Time: 04/26/20  8:35 PM   Specimen: Nasopharyngeal Swab  Result Value Ref Range Status   SARS Coronavirus 2 by RT PCR NEGATIVE NEGATIVE Final    Comment: (NOTE) SARS-CoV-2 target nucleic acids are NOT DETECTED.  The SARS-CoV-2 RNA is generally detectable in upper respiratoy specimens during the acute phase of infection. The lowest concentration of SARS-CoV-2 viral copies this assay can detect is 131 copies/mL. A negative result does not preclude SARS-Cov-2 infection and should not be used as the sole basis for treatment or other patient management decisions. A negative result may occur with  improper specimen collection/handling, submission of specimen other than nasopharyngeal swab, presence of viral mutation(s) within the areas targeted by this assay, and inadequate number of viral copies (<131 copies/mL). A negative result must be combined with clinical observations, patient history, and epidemiological information. The expected result is Negative.  Fact Sheet for Patients:  PinkCheek.be  Fact Sheet for Healthcare Providers:  GravelBags.it  This test is no t yet approved or cleared by the [place] FDA and  has been authorized for detection and/or diagnosis of SARS-CoV-2 by FDA under an Emergency Use Authorization (EUA). This EUA will remain  in effect (meaning this test can be used) for the duration of the COVID-19 declaration under Section 564(b)(1) of the Act, 21  U.S.C. section 360bbb-3(b)(1), unless the authorization is terminated or revoked sooner.     Influenza A by PCR NEGATIVE NEGATIVE Final   Influenza B by PCR NEGATIVE NEGATIVE Final    Comment: (NOTE) The Xpert Xpress SARS-CoV-2/FLU/RSV assay is intended as an aid in  the diagnosis of influenza from Nasopharyngeal swab specimens and  should not be used as a sole basis for treatment. Nasal washings and  aspirates are unacceptable for Xpert Xpress SARS-CoV-2/FLU/RSV  testing.  Fact Sheet for Patients: PinkCheek.be  Fact Sheet for Healthcare Providers: GravelBags.it  This test is not yet approved or cleared by the [place] FDA and  has been authorized for detection and/or diagnosis of SARS-CoV-2 by  FDA under an Emergency Use Authorization (EUA). This EUA will remain  in effect (meaning this test can be used) for the duration of the  Covid-19 declaration under Section 564(b)(1) of the Act, 21  U.S.C. section 360bbb-3(b)(1), unless the authorization is  terminated or revoked. Performed at [facility], [place] [address] [place], [place] [address]   MRSA PCR Screening     Status: None   Collection Time: 04/27/20  6:52 AM   Specimen: Nasal Mucosa; Nasopharyngeal  Result Value Ref Range Status   MRSA by PCR NEGATIVE NEGATIVE Final    Comment:        The GeneXpert MRSA Assay (FDA approved for NASAL specimens only), is one component of a comprehensive MRSA colonization surveillance program. It is not intended to diagnose MRSA infection nor to guide or monitor treatment for MRSA infections. Performed at [facility], [place] [address] [place], [place] [address]       Studies: No results found.   Scheduled Meds: . atorvastatin  10 mg Oral Daily  . bisacodyl  10 mg Rectal Q3 days  . busPIRone  15 mg Oral TID  . chlorhexidine  15 mL Mouth Rinse BID  . Chlorhexidine Gluconate Cloth  6 each Topical Daily   . cholecalciferol  2,000 Units Oral Daily  . citalopram  40 mg Oral Daily  . diazepam  1 mg Oral BID  . docusate sodium  100 mg Oral Daily  . enoxaparin (LOVENOX) injection  40 mg Subcutaneous QHS  . ferrous sulfate  300 mg Oral QHS  . fluticasone  1 spray Each Nare Daily  . HYDROcodone-acetaminophen  1 tablet Oral BID  . mouth rinse  15 mL Mouth Rinse q12n4p  . OLANZapine  1.25 mg Oral QHS  . pantoprazole  40 mg Oral BID   Continuous Infusions: . lactated ringers 50 mL/hr at 04/29/20 0055    Active Problems:   Aspiration pneumonia ([place])     Dewaine Oats Derek Jack, [facility]  If 7PM-7AM, please contact night-coverage [email or web address] Password [email or web address] 04/29/2020, 5:34 PM    LOS: 3 days

## 2020-04-30 DIAGNOSIS — R0989 Other specified symptoms and signs involving the circulatory and respiratory systems: Secondary | ICD-10-CM

## 2020-04-30 LAB — CBC WITH DIFFERENTIAL/PLATELET
Abs Immature Granulocytes: 0.24 10*3/uL — ABNORMAL HIGH (ref 0.00–0.07)
Basophils Absolute: 0.1 10*3/uL (ref 0.0–0.1)
Basophils Relative: 1 %
Eosinophils Absolute: 0.2 10*3/uL (ref 0.0–0.5)
Eosinophils Relative: 1 %
HCT: 33 % — ABNORMAL LOW (ref 36.0–46.0)
Hemoglobin: 10.4 g/dL — ABNORMAL LOW (ref 12.0–15.0)
Immature Granulocytes: 2 %
Lymphocytes Relative: 12 %
Lymphs Abs: 1.9 10*3/uL (ref 0.7–4.0)
MCH: 29.6 pg (ref 26.0–34.0)
MCHC: 31.5 g/dL (ref 30.0–36.0)
MCV: 94 fL (ref 80.0–100.0)
Monocytes Absolute: 1 10*3/uL (ref 0.1–1.0)
Monocytes Relative: 6 %
Neutro Abs: 12.6 10*3/uL — ABNORMAL HIGH (ref 1.7–7.7)
Neutrophils Relative %: 78 %
Platelets: 172 10*3/uL (ref 150–400)
RBC: 3.51 MIL/uL — ABNORMAL LOW (ref 3.87–5.11)
RDW: 14.2 % (ref 11.5–15.5)
WBC: 16 10*3/uL — ABNORMAL HIGH (ref 4.0–10.5)
nRBC: 0 % (ref 0.0–0.2)

## 2020-04-30 LAB — BASIC METABOLIC PANEL
Anion gap: 7 (ref 5–15)
BUN: 12 mg/dL (ref 8–23)
CO2: 25 mmol/L (ref 22–32)
Calcium: 8.4 mg/dL — ABNORMAL LOW (ref 8.9–10.3)
Chloride: 111 mmol/L (ref 98–111)
Creatinine, Ser: 0.47 mg/dL (ref 0.44–1.00)
GFR calc Af Amer: >60 mL/min (ref >60)
GFR calc non Af Amer: >60 mL/min (ref >60)
Glucose, Bld: 108 mg/dL — ABNORMAL HIGH (ref 70–99)
Potassium: 3.5 mmol/L (ref 3.5–5.1)
Sodium: 143 mmol/L (ref 135–145)

## 2020-04-30 NOTE — Plan of Care (Signed)

## 2020-04-30 NOTE — TOC Progression Note (Signed)
Transition of Care [facility]) - Progression Note    Patient Details  Name: JASENIA WEILBACHER MRN: [id number] Date of Birth: Jun 25, 1936  Transition of Care [facility]) CM/SW [place], [place] Phone Number: 04/30/2020, 3:16 PM  Clinical Narrative:    Plan is for pt to d/c back to her home at [facility] SNF when medically appropriate. Await discontinuation of HFNC to complete fl2. TOC team following.   Expected Discharge Plan: [place] Barriers to Discharge: Continued Medical Work up  Expected Discharge Plan and Services Expected Discharge Plan: [place] In-house Referral: Clinical Social Work Discharge Planning Services: CM Consult Post Acute Care Choice: Resumption of Svcs/PTA Provider, [place] Living arrangements for the past 2 months: [place]  Readmission Risk Interventions Readmission Risk Prevention Plan 04/28/2020  Transportation Screening Complete  PCP or Specialist Appt within 5-7 Days Not Complete  Not Complete comments SNF resident  Home Care Screening Not Complete  Home Care Screening Not Completed Comments SNF resident  Medication Review (RN CM) Referral to Pharmacy  Some recent data might be hidden

## 2020-04-30 NOTE — Progress Notes (Signed)
PROGRESS NOTE    [name]  IHK:[id number] DOB: [birth year] DOA: 04/26/2020 PCP: Reynold Bowen, MD  Brief Narrative:  84 year old female [place] home resident followed by [facility] care for palliative care in the outpatient setting Prior GI bleed?  Coffee-ground emesis-found to have a massive hiatal hernia 2015-EGD = gastritis Also previous admitted 2013 for similar complaint ?  C. difficile 2013 History of cerebral palsy with lower extremity contractures Patient aspirated solids at her nursing home came to the emergency room found to have an O2 sat 70% chest x-ray showed diffuse interstitial markings without prior film for pneumonitis-was transferred to the ICU and patient was started on BiPAP, Zosyn Transferred to hospitalist service 9/29-started on IV resuscitation transitioned off BiPAP placed on nonrebreather   Assessment & Plan:   Active Problems:   Aspiration pneumonia ([place])   1. Acute superimposed on chronic respiratory failure secondary to aspiration pneumonitis a. Initially was on high flow nasal cannula high-dose 14 L-currently just on simple nasal cannula b. Not bacterial infection Zosyn discontinued by CCM c. Avoid BiPAP given dysphagia and risk for worsening underlying condition d. Aggressive pulmonary toileting--consider scopolamine patch for secretions e. Saline lock IV fluid daily 2. Leukocytosis in the setting of 3. Chronic dysphagia a. We will repeat chest x-ray a.m. given mild leukocytosis b. Was on Zosyn 9/27 through 9/28 4. Hypokalemia and hypomagnesemia a. Resolved with replacement, [place] up 5. Contractures with cerebral palsy a. Continue Valium 1 mg twice daily b. May need to minimize other medications such as antidepressants as below 6. Depression a. On multiple meds BuSpar 15 3 times daily Celexa 40 daily olanzapine 1.25 HF b. Probably scaled back BuSpar 7.  prior C. Difficile Prior GI bleed a. Continue monitoring at this stage of  life  DVT prophylaxis: Lovenox Code Status: DNR Family Communication: None at bedside Disposition:   Status is: Inpatient  Remains inpatient appropriate because:IV treatments appropriate due to intensity of illness or inability to take PO   Dispo: The patient is from: SNF              Anticipated d/c is to: SNF              Anticipated d/c date is: 3 days              Patient currently is not medically stable to d/c.       Consultants:   None  Procedures: None  Antimicrobials: None currently   Subjective: Coherent alert pleasant quite cachectic Nurse tells me she has been eating however she looks quite frail She is not in any pain she feels slightly warm to touch She is on 10 L  Objective: Vitals:   04/29/20 1000 04/29/20 1431 04/29/20 2126 04/30/20 0520  BP: 128/67 (!) 104/52 136/76 139/77  Pulse: 82 67 91 79  Resp: 20 18 16 17   Temp: 98.9 F (37.2 C) 98.9 F (37.2 C) 99.5 F (37.5 C) 97.8 F (36.6 C)  TempSrc: Oral Oral Oral Oral  SpO2:  94% 92% 97%    Intake/Output Summary (Last 24 hours) at 04/30/2020 7564 Last data filed at 04/29/2020 1312 Gross per 24 hour  Intake 360 ml  Output --  Net 360 ml   There were no vitals filed for this visit.  Examination:  General exam: EOMI NCAT cachectic quite contracted in her lower extremities especially able to move her upper extremity Respiratory system: Poor exam some rales posterolaterally right lung field Cardiovascular system: S1-S2 no murmur Gastrointestinal  system: Soft nontender no rebound. Central nervous system: Contractures lower extremity Extremities: ROM severely limited in the knees ankles Skin: Not examined Psychiatry: Euthymic congruent  Data Reviewed: I have personally reviewed following labs and imaging studies  BUNs/creatinine down from admission 19/0.8-->12/0.4 Potassium 3.5   Radiology Studies: No results found.   Scheduled Meds: . atorvastatin  10 mg Oral Daily  . bisacodyl   10 mg Rectal Q3 days  . busPIRone  15 mg Oral TID  . chlorhexidine  15 mL Mouth Rinse BID  . Chlorhexidine Gluconate Cloth  6 each Topical Daily  . cholecalciferol  2,000 Units Oral Daily  . citalopram  40 mg Oral Daily  . diazepam  1 mg Oral BID  . docusate sodium  100 mg Oral Daily  . enoxaparin (LOVENOX) injection  40 mg Subcutaneous QHS  . ferrous sulfate  300 mg Oral QHS  . fluticasone  1 spray Each Nare Daily  . HYDROcodone-acetaminophen  1 tablet Oral BID  . mouth rinse  15 mL Mouth Rinse q12n4p  . OLANZapine  1.25 mg Oral QHS  . pantoprazole  40 mg Oral BID  . potassium chloride  40 mEq Oral BID   Continuous Infusions: . lactated ringers 50 mL/hr at 04/29/20 2218     LOS: 4 days    Time spent: [place], MD To lower extremity hospitalists To contact the attending provider between 7A-7P or the covering provider during after hours 7P-7A, please log into the web site [email or web address] and access using universal [place] password for that web site. If you do not have the password, please call the hospital operator.  04/30/2020, 7:18 AM

## 2020-05-01 ENCOUNTER — Inpatient Hospital Stay (HOSPITAL_COMMUNITY): Payer: Medicare Other

## 2020-05-01 DIAGNOSIS — R1310 Dysphagia, unspecified: Secondary | ICD-10-CM

## 2020-05-01 DIAGNOSIS — J9601 Acute respiratory failure with hypoxia: Secondary | ICD-10-CM | POA: Diagnosis not present

## 2020-05-01 DIAGNOSIS — G808 Other cerebral palsy: Secondary | ICD-10-CM | POA: Diagnosis not present

## 2020-05-01 DIAGNOSIS — J690 Pneumonitis due to inhalation of food and vomit: Secondary | ICD-10-CM | POA: Diagnosis not present

## 2020-05-01 LAB — COMPREHENSIVE METABOLIC PANEL
ALT: 21 U/L (ref 0–44)
AST: 23 U/L (ref 15–41)
Albumin: 2.3 g/dL — ABNORMAL LOW (ref 3.5–5.0)
Alkaline Phosphatase: 100 U/L (ref 38–126)
Anion gap: 10 (ref 5–15)
BUN: 9 mg/dL (ref 8–23)
CO2: 22 mmol/L (ref 22–32)
Calcium: 8.6 mg/dL — ABNORMAL LOW (ref 8.9–10.3)
Chloride: 108 mmol/L (ref 98–111)
Creatinine, Ser: 0.45 mg/dL (ref 0.44–1.00)
GFR calc Af Amer: >60 mL/min (ref >60)
GFR calc non Af Amer: >60 mL/min (ref >60)
Glucose, Bld: 108 mg/dL — ABNORMAL HIGH (ref 70–99)
Potassium: 3.5 mmol/L (ref 3.5–5.1)
Sodium: 140 mmol/L (ref 135–145)
Total Bilirubin: 0.9 mg/dL (ref 0.3–1.2)
Total Protein: 5.3 g/dL — ABNORMAL LOW (ref 6.5–8.1)

## 2020-05-01 LAB — CBC WITH DIFFERENTIAL/PLATELET
Abs Immature Granulocytes: 0.5 10*3/uL — ABNORMAL HIGH (ref 0.00–0.07)
Basophils Absolute: 0.1 10*3/uL (ref 0.0–0.1)
Basophils Relative: 1 %
Eosinophils Absolute: 0.1 10*3/uL (ref 0.0–0.5)
Eosinophils Relative: 1 %
HCT: 34.1 % — ABNORMAL LOW (ref 36.0–46.0)
Hemoglobin: 11 g/dL — ABNORMAL LOW (ref 12.0–15.0)
Immature Granulocytes: 4 %
Lymphocytes Relative: 16 %
Lymphs Abs: 2.2 10*3/uL (ref 0.7–4.0)
MCH: 29.5 pg (ref 26.0–34.0)
MCHC: 32.3 g/dL (ref 30.0–36.0)
MCV: 91.4 fL (ref 80.0–100.0)
Monocytes Absolute: 1.2 10*3/uL — ABNORMAL HIGH (ref 0.1–1.0)
Monocytes Relative: 9 %
Neutro Abs: 10 10*3/uL — ABNORMAL HIGH (ref 1.7–7.7)
Neutrophils Relative %: 69 %
Platelets: 176 10*3/uL (ref 150–400)
RBC: 3.73 MIL/uL — ABNORMAL LOW (ref 3.87–5.11)
RDW: 14.2 % (ref 11.5–15.5)
WBC: 14.1 10*3/uL — ABNORMAL HIGH (ref 4.0–10.5)
nRBC: 0 % (ref 0.0–0.2)

## 2020-05-02 DIAGNOSIS — J9601 Acute respiratory failure with hypoxia: Secondary | ICD-10-CM | POA: Diagnosis not present

## 2020-05-02 DIAGNOSIS — G808 Other cerebral palsy: Secondary | ICD-10-CM | POA: Diagnosis not present

## 2020-05-02 DIAGNOSIS — R1310 Dysphagia, unspecified: Secondary | ICD-10-CM | POA: Diagnosis not present

## 2020-05-02 DIAGNOSIS — J690 Pneumonitis due to inhalation of food and vomit: Secondary | ICD-10-CM | POA: Diagnosis not present

## 2020-05-02 LAB — COMPREHENSIVE METABOLIC PANEL
ALT: 22 U/L (ref 0–44)
AST: 21 U/L (ref 15–41)
Albumin: 2.2 g/dL — ABNORMAL LOW (ref 3.5–5.0)
Alkaline Phosphatase: 119 U/L (ref 38–126)
Anion gap: 9 (ref 5–15)
BUN: 12 mg/dL (ref 8–23)
CO2: 25 mmol/L (ref 22–32)
Calcium: 8.5 mg/dL — ABNORMAL LOW (ref 8.9–10.3)
Chloride: 106 mmol/L (ref 98–111)
Creatinine, Ser: 0.72 mg/dL (ref 0.44–1.00)
GFR calc Af Amer: >60 mL/min (ref >60)
GFR calc non Af Amer: >60 mL/min (ref >60)
Glucose, Bld: 116 mg/dL — ABNORMAL HIGH (ref 70–99)
Potassium: 3.6 mmol/L (ref 3.5–5.1)
Sodium: 140 mmol/L (ref 135–145)
Total Bilirubin: 0.7 mg/dL (ref 0.3–1.2)
Total Protein: 5.4 g/dL — ABNORMAL LOW (ref 6.5–8.1)

## 2020-05-02 LAB — CBC WITH DIFFERENTIAL/PLATELET
Abs Immature Granulocytes: 0.8 10*3/uL — ABNORMAL HIGH (ref 0.00–0.07)
Basophils Absolute: 0.1 10*3/uL (ref 0.0–0.1)
Basophils Relative: 1 %
Eosinophils Absolute: 0.3 10*3/uL (ref 0.0–0.5)
Eosinophils Relative: 2 %
HCT: 34.3 % — ABNORMAL LOW (ref 36.0–46.0)
Hemoglobin: 10.8 g/dL — ABNORMAL LOW (ref 12.0–15.0)
Immature Granulocytes: 6 %
Lymphocytes Relative: 18 %
Lymphs Abs: 2.6 10*3/uL (ref 0.7–4.0)
MCH: 28.8 pg (ref 26.0–34.0)
MCHC: 31.5 g/dL (ref 30.0–36.0)
MCV: 91.5 fL (ref 80.0–100.0)
Monocytes Absolute: 1.6 10*3/uL — ABNORMAL HIGH (ref 0.1–1.0)
Monocytes Relative: 11 %
Neutro Abs: 9.1 10*3/uL — ABNORMAL HIGH (ref 1.7–7.7)
Neutrophils Relative %: 62 %
Platelets: 203 10*3/uL (ref 150–400)
RBC: 3.75 MIL/uL — ABNORMAL LOW (ref 3.87–5.11)
RDW: 14.2 % (ref 11.5–15.5)
WBC: 14.5 10*3/uL — ABNORMAL HIGH (ref 4.0–10.5)
nRBC: 0 % (ref 0.0–0.2)

## 2020-05-02 LAB — PROCALCITONIN: Procalcitonin: 0.36 ng/mL

## 2020-05-02 NOTE — Plan of Care (Signed)
  Problem: Clinical Measurements: Goal: Will remain free from infection Outcome: Progressing   Problem: Nutrition: Goal: Adequate nutrition will be maintained Outcome: Progressing   Problem: Pain Managment: Goal: General experience of comfort will improve Outcome: Progressing   Problem: Safety: Goal: Ability to remain free from injury will improve Outcome: Progressing   Problem: Skin Integrity: Goal: Risk for impaired skin integrity will decrease Outcome: Progressing

## 2020-05-02 NOTE — Progress Notes (Signed)
PROGRESS NOTE  [name]:[id number] DOB: 1936-03-26 DOA: 04/26/2020 PCP: Reynold Bowen, MD  Brief History   84 year old female [place] home resident followed by [facility] care for palliative care in the outpatient setting. The patient has a past medical history significant for prior GI bleed with coffee-ground emesis. She was found to have a massive hiatal hernia in EGD performed in 2015 with gastritis. She had an admission for a similar complaint in 2013 when she was found to have C Diff colitis. Past medical history is also significant for cerebral palsy with lower extremity contractures.   She was sent to the ED by her facility after aspiration of solid foods at the nursing home. At admission she was found to be hypoxic with Sao2 of 70% and CXR compatible with pneumonitis. She was admitted to the ICU and given BIPAP and zosyn.  History of cerebral palsy with lower extremity contractures Patient aspirated solids at her nursing home came to the emergency room found to have an O2 sat 70% chest x-ray showed diffuse interstitial markings without prior film for pneumonitis-was transferred to the ICU and patient was started on BiPAP, Zosyn Transferred to hospitalist service 9/29-started on IV resuscitation transitioned off BiPAP placed on nonrebreather  She was transferred to the hospitalists service on 04/28/2020. She has been transitioned off of BIPAP to nasal cannula. She is currently saturating 92% on nasal cannula at 10L.  Consultants  . PCCM  Procedures  . None  Antibiotics   Anti-infectives (From admission, onward)   Start     Dose/Rate Route Frequency Ordered Stop   04/26/20 2300  piperacillin-tazobactam (ZOSYN) IVPB 3.375 g  Status:  Discontinued        3.375 g 12.5 mL/hr over 240 Minutes Intravenous Every 8 hours 04/26/20 2228 04/27/20 1020     Subjective  The patient is resting comfortably. She continues to complain of difficulty breathing. She is too weak to  even turn over on her own. No new complaints.  Objective   Vitals:  Vitals:   05/02/20 0434 05/02/20 1501  BP: 125/67 (!) 102/55  Pulse: 84 85  Resp: 18 (!) 24  Temp: 98.6 F (37 C) 98.5 F (36.9 C)  SpO2: (!) 89% (!) 89%  Exam:  Constitutional:  . The patient is awake, alert, and oriented x 3. No acute distress. Respiratory:  . Positive for tachypnea and accessory muscle use. increased work of breathing. . No wheezes or rhonchi . Positive for rales . No tactile fremitus Cardiovascular:  . Regular rate and rhythm . No murmurs, ectopy, or gallups. . No lateral PMI. No thrills. Abdomen:  . Abdomen is soft, non-tender, non-distended . No hernias, masses, or organomegaly . Normoactive bowel sounds.  Musculoskeletal:  . No cyanosis, clubbing, or edema Skin:  . No rashes, lesions, ulcers . palpation of skin: no induration or nodules Neurologic:  . CN 2-12 intact . Sensation all 4 extremities intact Psychiatric:  . Mental status o Mood, affect appropriate o Orientation to person, place, time  . judgment and insight appear intact  I have personally reviewed the following:   Today's Data  . Vitals, CMP, CBC  Micro Data  . MRSA by PCR: Negative  Scheduled Meds: . atorvastatin  10 mg Oral Daily  . bisacodyl  10 mg Rectal Q3 days  . busPIRone  15 mg Oral TID  . chlorhexidine  15 mL Mouth Rinse BID  . Chlorhexidine Gluconate Cloth  6 each Topical Daily  . cholecalciferol  2,000  Units Oral Daily  . citalopram  40 mg Oral Daily  . diazepam  1 mg Oral BID  . docusate sodium  100 mg Oral Daily  . enoxaparin (LOVENOX) injection  40 mg Subcutaneous QHS  . ferrous sulfate  300 mg Oral QHS  . fluticasone  1 spray Each Nare Daily  . HYDROcodone-acetaminophen  1 tablet Oral BID  . mouth rinse  15 mL Mouth Rinse q12n4p  . OLANZapine  1.25 mg Oral QHS  . pantoprazole  40 mg Oral BID   Continuous Infusions:  Active Problems:   Aspiration pneumonia (HCC)   LOS: 6 days    A & P   Acute on chronic respiratory failure secondary to aspiration pneumonitis Initially was on high flow nasal cannula high-dose 14 L-currently just on simple nasal cannula at 10L. Not bacterial infection Zosyn discontinued by PCCM. Avoid BiPAP given dysphagia and risk for worsening underlying condition. Aggressive pulmonary toileting--consider scopolamine patch for secretions. Saline lock IV fluid daily.  Chronic dysphagia: Repeat chest x-ray was ordered given mild leukocytosis and continued hypoxemia. It has demonstrated worsening dense patchy bilateral lower lobe consolidatino and hazy left parahilar lung opacity compatible with worsening multilobar pneumonia. Would restart Zosyn. Will also consult SLP to evaluate swallow.   Hypokalemia and hypomagnesemia: Resolved with replacement. 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 Continue Valium 1 mg twice daily.  Depression: On multiple meds BuSpar 15mg  3 times daily Celexa 40 daily olanzapine 1.25 HF.  I have seen and examined this patient myself. I have spent 34 minutes in her evaluation and care.  DVT Prophylaxis: Lovenox CODE STATUS: DNR Family Communication: None available Disposition: From nursing facility. Anticipate discharge back to nursing facility. Status is: Inpatient  Remains inpatient appropriate because:Inpatient level of care appropriate due to severity of illness   Dispo: The patient is from: SNF              Anticipated d/c is to: SNF              Anticipated d/c date is: 3 days              Patient currently is not medically stable to d/c.  Marriana Hibberd, DO [facility] Direct contact: see [email or web address]  7PM-7AM contact night coverage as above 05/02/2020, 6:36 PM  LOS: 6 days

## 2020-05-02 NOTE — Progress Notes (Signed)
PROGRESS NOTE  [name] DOB: 1936-04-14 DOA: 04/26/2020 PCP: Sharon Bowen, MD  Brief History   84 year old female [name] home resident followed by [facility] for palliative care in the outpatient setting. The patient has a past medical history significant for prior GI bleed with coffee-ground emesis. She was found to have a massive hiatal hernia in EGD performed in 2015 with gastritis. She had an admission for a similar complaint in 2013 when she was found to have C Diff colitis. Past medical history is also significant for cerebral palsy with lower extremity contractures.   She was sent to the ED by her facility after aspiration of solid foods at the nursing home. At admission she was found to be hypoxic with Sao2 of 70% and CXR compatible with pneumonitis. She was admitted to the ICU and given BIPAP and zosyn.  History of cerebral palsy with lower extremity contractures Patient aspirated solids at her nursing home came to the emergency room found to have an O2 sat 70% chest x-ray showed diffuse interstitial markings without prior film for pneumonitis-was transferred to the ICU and patient was started on BiPAP, Zosyn Transferred to hospitalist service 9/29-started on IV resuscitation transitioned off BiPAP placed on nonrebreather  She was transferred to the hospitalists service on 04/28/2020. She has been transitioned off of BIPAP to nasal cannula. She is currently saturating 92% on nasal cannula at 10L.  Consultants  . PCCM  Procedures  . None  Antibiotics   Anti-infectives (From admission, onward)   Start     Dose/Rate Route Frequency Ordered Stop   04/26/20 2300  piperacillin-tazobactam (ZOSYN) IVPB 3.375 g  Status:  Discontinued        3.375 g 12.5 mL/hr over 240 Minutes Intravenous Every 8 hours 04/26/20 2228 04/27/20 1020    .  Subjective  The patient is resting comfortably. She continues to complain of difficulty breathing. No new  complaints.  Objective   Vitals:  Vitals:   05/01/20 2118 05/02/20 0434  BP: 132/70 125/67  Pulse: 72 84  Resp: 17 18  Temp: 98.5 F (36.9 C) 98.6 F (37 C)  SpO2: 96% (!) 89%  Exam:  Constitutional:  . The patient is awake, alert, and oriented x 3. No acute distress. Respiratory:  . Positive for tachypnea and accessory muscle use. increased work of breathing. . No wheezes or rhonchi . Positive for rales . No tactile fremitus Cardiovascular:  . Regular rate and rhythm . No murmurs, ectopy, or gallups. . No lateral PMI. No thrills. Abdomen:  . Abdomen is soft, non-tender, non-distended . No hernias, masses, or organomegaly . Normoactive bowel sounds.  Musculoskeletal:  . No cyanosis, clubbing, or edema Skin:  . No rashes, lesions, ulcers . palpation of skin: no induration or nodules Neurologic:  . CN 2-12 intact . Sensation all 4 extremities intact Psychiatric:  . Mental status o Mood, affect appropriate o Orientation to person, place, time  . judgment and insight appear intact  I have personally reviewed the following:   Today's Data  . Vitals, CMP, CBC  Micro Data  . MRSA by PCR: Negative  Scheduled Meds: . atorvastatin  10 mg Oral Daily  . bisacodyl  10 mg Rectal Q3 days  . busPIRone  15 mg Oral TID  . chlorhexidine  15 mL Mouth Rinse BID  . Chlorhexidine Gluconate Cloth  6 each Topical Daily  . cholecalciferol  2,000 Units Oral Daily  . citalopram  40 mg Oral Daily  .  diazepam  1 mg Oral BID  . docusate sodium  100 mg Oral Daily  . enoxaparin (LOVENOX) injection  40 mg Subcutaneous QHS  . ferrous sulfate  300 mg Oral QHS  . fluticasone  1 spray Each Nare Daily  . HYDROcodone-acetaminophen  1 tablet Oral BID  . mouth rinse  15 mL Mouth Rinse q12n4p  . OLANZapine  1.25 mg Oral QHS  . pantoprazole  40 mg Oral BID   Continuous Infusions:  Active Problems:   Aspiration pneumonia (HCC)   LOS: 6 days   A & P   Acute on chronic respiratory  failure secondary to aspiration pneumonitis Initially was on high flow nasal cannula high-dose 14 L-currently just on simple nasal cannula at 10L. Not bacterial infection Zosyn discontinued by PCCM. Avoid BiPAP given dysphagia and risk for worsening underlying condition. Aggressive pulmonary toileting--consider scopolamine patch for secretions. Saline lock IV fluid daily.  Chronic dysphagia: Repeat chest x-ray was ordered given mild leukocytosis and continued hypoxemia. It has demonstrated worsening dense patchy bilateral lower lobe consolidatino and hazy left parahilar lung opacity compatible with worsening multilobar pneumonia. Would restart Zosyn. Will also consult SLP to evaluate swallow.   Hypokalemia and hypomagnesemia: Resolved with replacement. 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 Continue Valium 1 mg twice daily.  Depression: On multiple meds BuSpar 15mg  3 times daily Celexa 40 daily olanzapine 1.25 HF.  I have seen and examined this patient myself. I have spent 34 minutes in her evaluation and care.  DVT Prophylaxis: Lovenox CODE STATUS: DNR Family Communication: None available Disposition: From nursing facility. Anticipate discharge back to nursing facility. Status is: Inpatient  Remains inpatient appropriate because:Inpatient level of care appropriate due to severity of illness   Dispo: The patient is from: SNF              Anticipated d/c is to: SNF              Anticipated d/c date is: 3 days              Patient currently is not medically stable to d/c.  Numa Heatwole, DO [facility] Direct contact: see [email or web address]  7PM-7AM contact night coverage as above 05/01/2020, 20:20 PM  LOS: 6 days

## 2020-05-02 NOTE — Plan of Care (Signed)

## 2020-05-02 NOTE — Evaluation (Addendum)
Clinical/Bedside Swallow Evaluation Patient Details  Name: KEAH LAMBA MRN: [id number] Date of Birth: 06-11-1936  Today's Date: 05/02/2020 Time: SLP Start Time (ACUTE ONLY): 1093 SLP Stop Time (ACUTE ONLY): 1718 SLP Time Calculation (min) (ACUTE ONLY): 26 min  Past Medical History:  Past Medical History:  Diagnosis Date  . Cerebral palsy ([place])   . Constipation   . Hyperlipidemia    Past Surgical History:  Past Surgical History:  Procedure Laterality Date  . ESOPHAGOGASTRODUODENOSCOPY  01/28/2012   Procedure: ESOPHAGOGASTRODUODENOSCOPY (EGD);  Surgeon: Lear Ng, MD;  Location: [facility] ENDOSCOPY;  Service: Endoscopy;  Laterality: N/A;  . ESOPHAGOGASTRODUODENOSCOPY N/A 05/10/2014   Procedure: ESOPHAGOGASTRODUODENOSCOPY (EGD);  Surgeon: Missy Sabins, MD;  Location: [facility] ENDOSCOPY;  Service: Endoscopy;  Laterality: N/A;  . right hip surgery    . TONSILLECTOMY     HPI:  84 year old female from [facility] home admitted after aspiration of solid food. Per MD note resident followed by [facility] for palliative care in the outpatient setting. PMH: cerebral palsy, GI bleed with coffee-ground emesis, massive hiatal hernia in EGD performed in 2015 with gastritis. MBS 2018 flash penetration, mild oral dysphagia, Dys 3/thin recommended. MBS 2019 triggers at pyriforms with penetration before swallow, fully ejected penetrate, continue Dys 3/thin.    Assessment / Plan / Recommendation Clinical Impression  Pt has constanst labial/facial  movements are rest (labial retraction) and reports increased difficulty swallowing food and liquid recently. Multiple swallows present likely due to history of pharyngeal retention from previous MBS and audible swallow. No coughing with liquids and there was no significant difference with thin and nectar via straw from clinical observation. Coughed while masticating cracker. Mastication with solid and spaghetti (dinner tray) was open mouthed without residual.  SHe needed cues to decrease rate as she was able to self feed with set up. Pt reports it is easier to consume thick liquids versus thin. Recommend continue Dys 3, nectar. Pt may need MBS to assess current swallow ability and need for modifications.     SLP Visit Diagnosis: Dysphagia, unspecified (R13.10)    Aspiration Risk  Moderate aspiration risk;Mild aspiration risk    Diet Recommendation Dysphagia 3 (Mech soft);Nectar-thick liquid   Liquid Administration via: Cup;Straw Medication Administration: Crushed with puree Supervision: Patient able to self feed;Staff to assist with self feeding;Full supervision/cueing for compensatory strategies Compensations: Minimize environmental distractions;Slow rate;Small sips/bites Postural Changes: Seated upright at 90 degrees;Remain upright for at least 30 minutes after po intake    Other  Recommendations Oral Care Recommendations: Oral care BID   Follow up Recommendations Skilled Nursing facility      Frequency and Duration min 2x/week  2 weeks       Prognosis Prognosis for Safe Diet Advancement: Fair      Swallow Study   General HPI: 84 year old female from [facility] home admitted after aspiration of solid food. Per MD note resident followed by [facility] for palliative care in the outpatient setting. PMH: cerebral palsy, GI bleed with coffee-ground emesis, massive hiatal hernia in EGD performed in 2015 with gastritis. MBS 2018 flash penetration, mild oral dysphagia, Dys 3/thin recommended. MBS 2019 triggers at pyriforms with penetration before swallow, fully ejected penetrate, continue Dys 3/thin.  Type of Study: Bedside Swallow Evaluation Previous Swallow Assessment:  (see HPI) Diet Prior to this Study: Dysphagia 3 (soft);Nectar-thick liquids Temperature Spikes Noted: No Respiratory Status: Nasal cannula History of Recent Intubation: No Behavior/Cognition: Pleasant mood;Cooperative;Alert;Requires cueing Oral Cavity Assessment:  Within Functional Limits Oral Care Completed by SLP: No  Oral Cavity - Dentition: Adequate natural dentition Vision: Functional for self-feeding Self-Feeding Abilities: Needs assist Patient Positioning: Upright in bed Baseline Vocal Quality: Normal Volitional Cough: Weak Volitional Swallow: Able to elicit    Oral/Motor/Sensory Function Overall Oral Motor/Sensory Function: Mild impairment Facial ROM: Within Functional Limits Facial Symmetry: Within Functional Limits Facial Strength: Within Functional Limits Lingual ROM:  (decreased bilateral) Lingual Symmetry: Within Functional Limits   Ice Chips Ice chips: Not tested   Thin Liquid Thin Liquid: Impaired Presentation: Straw;Cup Oral Phase Impairments: Reduced labial seal;Reduced lingual movement/coordination Oral Phase Functional Implications: Left anterior spillage;Right anterior spillage Pharyngeal  Phase Impairments: Multiple swallows;Other (comments) (audible swallow)    Nectar Thick Nectar Thick Liquid: Impaired Pharyngeal Phase Impairments: Multiple swallows;Other (comments) (audible swallow)   Honey Thick Honey Thick Liquid: Not tested   Puree Puree: Impaired Oral Phase Impairments: Reduced lingual movement/coordination Pharyngeal Phase Impairments: Multiple swallows   Solid     Solid: Impaired Oral Phase Impairments: Reduced lingual movement/coordination      Doryan Bahl, Orbie Pyo 05/02/2020,5:55 PM  Orbie Pyo [place] Risk analyst [phone] Office [phone]

## 2020-05-03 ENCOUNTER — Inpatient Hospital Stay (HOSPITAL_COMMUNITY): Payer: Medicare Other

## 2020-05-03 DIAGNOSIS — J690 Pneumonitis due to inhalation of food and vomit: Secondary | ICD-10-CM | POA: Diagnosis not present

## 2020-05-03 DIAGNOSIS — J9601 Acute respiratory failure with hypoxia: Secondary | ICD-10-CM | POA: Diagnosis not present

## 2020-05-03 DIAGNOSIS — G808 Other cerebral palsy: Secondary | ICD-10-CM | POA: Diagnosis not present

## 2020-05-03 DIAGNOSIS — R1310 Dysphagia, unspecified: Secondary | ICD-10-CM | POA: Diagnosis not present

## 2020-05-03 LAB — CBC WITH DIFFERENTIAL/PLATELET
Abs Immature Granulocytes: 1.1 10*3/uL — ABNORMAL HIGH (ref 0.00–0.07)
Basophils Absolute: 0.1 10*3/uL (ref 0.0–0.1)
Basophils Relative: 1 %
Eosinophils Absolute: 0.2 10*3/uL (ref 0.0–0.5)
Eosinophils Relative: 2 %
HCT: 34.7 % — ABNORMAL LOW (ref 36.0–46.0)
Hemoglobin: 11.1 g/dL — ABNORMAL LOW (ref 12.0–15.0)
Immature Granulocytes: 10 %
Lymphocytes Relative: 21 %
Lymphs Abs: 2.4 10*3/uL (ref 0.7–4.0)
MCH: 29.4 pg (ref 26.0–34.0)
MCHC: 32 g/dL (ref 30.0–36.0)
MCV: 92 fL (ref 80.0–100.0)
Monocytes Absolute: 1.7 10*3/uL — ABNORMAL HIGH (ref 0.1–1.0)
Monocytes Relative: 15 %
Neutro Abs: 5.9 10*3/uL (ref 1.7–7.7)
Neutrophils Relative %: 51 %
Platelets: 239 10*3/uL (ref 150–400)
RBC: 3.77 MIL/uL — ABNORMAL LOW (ref 3.87–5.11)
RDW: 14.2 % (ref 11.5–15.5)
WBC: 11.4 10*3/uL — ABNORMAL HIGH (ref 4.0–10.5)
nRBC: 0 % (ref 0.0–0.2)

## 2020-05-03 LAB — COMPREHENSIVE METABOLIC PANEL
ALT: 33 U/L (ref 0–44)
AST: 31 U/L (ref 15–41)
Albumin: 2.2 g/dL — ABNORMAL LOW (ref 3.5–5.0)
Alkaline Phosphatase: 167 U/L — ABNORMAL HIGH (ref 38–126)
Anion gap: 6 (ref 5–15)
BUN: 12 mg/dL (ref 8–23)
CO2: 28 mmol/L (ref 22–32)
Calcium: 8.4 mg/dL — ABNORMAL LOW (ref 8.9–10.3)
Chloride: 106 mmol/L (ref 98–111)
Creatinine, Ser: 0.56 mg/dL (ref 0.44–1.00)
GFR calc Af Amer: >60 mL/min (ref >60)
GFR calc non Af Amer: >60 mL/min (ref >60)
Glucose, Bld: 111 mg/dL — ABNORMAL HIGH (ref 70–99)
Potassium: 3.5 mmol/L (ref 3.5–5.1)
Sodium: 140 mmol/L (ref 135–145)
Total Bilirubin: 0.8 mg/dL (ref 0.3–1.2)
Total Protein: 5.5 g/dL — ABNORMAL LOW (ref 6.5–8.1)

## 2020-05-03 LAB — PROCALCITONIN: Procalcitonin: 0.31 ng/mL

## 2020-05-03 NOTE — Progress Notes (Signed)
  Speech Language Pathology Treatment: Dysphagia  Patient Details Name: [name] MRN: [id number] DOB: Dec 03, 1935 Today's Date: 05/03/2020 Time: 1601-0932 SLP Time Calculation (min) (ACUTE ONLY): 13 min  Assessment / Plan / Recommendation Clinical Impression  Pt was seen for dysphagia treatment during breakfast. Oral care completed by SLP prior to meal in order to remove dried secretions. She tolerated dys 3 diet but demonstrated prolonged mastication and manipulation of POs. She continues to demonstrate constant facial movement at rest. Pt demonstrated immediate coughing x1 with soft solid. No s/sx of aspiration observed with nectar thick liquid. During meal pt demonstrated side-lying position not adequate for feeding. Recommend that pt be seated fully upright during meals. Recommend pt continue dys 3 diet and nectar. SLP will f/u with MBS to further assess swallow function given concern for aspiration.     HPI HPI: 84 year old female from [place] home admitted after aspiration of solid food. Per MD note resident followed by [facility] for palliative care in the outpatient setting. PMH: cerebral palsy, GI bleed with coffee-ground emesis, massive hiatal hernia in EGD performed in 2015 with gastritis. MBS 2018 flash penetration, mild oral dysphagia, Dys 3/thin recommended. MBS 2019 triggers at pyriforms with penetration before swallow, fully ejected penetrate, continue Dys 3/thin.       SLP Plan          Recommendations                   Oral Care Recommendations: Oral care BID Follow up Recommendations: Skilled Nursing facility SLP Visit Diagnosis: Dysphagia, unspecified (R13.10)       GO                Greggory Keen 05/03/2020, 9:02 AM

## 2020-05-03 NOTE — NC FL2 (Signed)
[place] MEDICAID FL2 LEVEL OF CARE SCREENING TOOL     IDENTIFICATION  Patient Name: [name] Birthdate: 1936-02-27 Sex: female Admission Date (Current Location): 04/26/2020  [facility] [place] Number:  Herbalist and Address:  [facility] [place], [place] [address], [place], [place] [address]      Provider Number: [id number]  Attending Physician Name and Address:  Karie Kirks, DO  Relative Name and Phone Number:       Current Level of Care: Hospital Recommended Level of Care: [place] Prior Approval Number:    Date Approved/Denied:   PASRR Number:    Discharge Plan: SNF    Current Diagnoses: Patient Active Problem List   Diagnosis Date Noted  . Aspiration pneumonia ([place]) 04/26/2020  . Dysphagia 11/30/2015  . Hiatal hernia with gastroesophageal reflux 05/10/2014  . Spastic paraplegia 05/06/2014  . C. difficile colitis 03/27/2012  . Colitis 03/26/2012  . Diarrhea 03/25/2012  . UTI (lower urinary tract infection) 03/25/2012  . Cerebral palsy ([place]) 03/25/2012  . H/O: GI bleed 03/25/2012  . Esophageal ulcer 01/29/2012  . Hypokalemia 01/29/2012  . Anemia 01/29/2012  . Acute upper GI bleed 01/28/2012  . Hematemesis 01/28/2012    Orientation RESPIRATION BLADDER Height & Weight     Self, Place, Situation  O2 (3.5 L nasal canula) Incontinent Weight: 105 lb 6.1 oz (47.8 kg) Height:     BEHAVIORAL SYMPTOMS/MOOD NEUROLOGICAL BOWEL NUTRITION STATUS      Incontinent Diet (see discharge summary)  AMBULATORY STATUS COMMUNICATION OF NEEDS Skin   Extensive Assist Verbally Skin abrasions, Other (Comment) (L leg abrasion; generalized ecchymosis)                       Personal Care Assistance Level of Assistance  Bathing, Feeding, Dressing Bathing Assistance: Maximum assistance Feeding assistance: Limited assistance Dressing Assistance: Maximum assistance     Functional Limitations Info  Hearing, Sight, Speech Sight  Info: Adequate Hearing Info: Adequate Speech Info: Adequate    SPECIAL CARE FACTORS FREQUENCY  PT (By licensed PT), OT (By licensed OT)     PT Frequency: 5x week OT Frequency: 5x week            Contractures Contractures Info: Not present    Additional Factors Info  Code Status, Allergies, Psychotropic Code Status Info: DNR Allergies Info: No Known Allergies Psychotropic Info: busPIRone (BUSPAR) tablet 15 mg 3x daily PO; citalopram (CELEXA) tablet 40 mg daily PO; diazepam (VALIUM) tablet 1 mg 2x daily PO; OLANZapine (ZYPREXA) tablet 1.25 mg daily at bedtime PO         Current Medications (05/03/2020):  This is the current hospital active medication list Current Facility-Administered Medications  Medication Dose Route Frequency Provider Last Rate Last Admin  . acetaminophen (TYLENOL) tablet 650 mg  650 mg Oral Q4H PRN Kipp Brood, MD   650 mg at 04/29/20 2110  . albuterol (PROVENTIL) (2.5 MG/3ML) 0.083% nebulizer solution 2.5 mg  2.5 mg Nebulization Q2H PRN Kipp Brood, MD   2.5 mg at 04/30/20 2036  . atorvastatin (LIPITOR) tablet 10 mg  10 mg Oral Daily Agarwala, Einar Grad, MD   10 mg at 05/03/20 1016  . bisacodyl (DULCOLAX) suppository 10 mg  10 mg Rectal Q3 days Kipp Brood, MD   10 mg at 05/01/20 1017  . busPIRone (BUSPAR) tablet 15 mg  15 mg Oral TID Kipp Brood, MD   15 mg at 05/03/20 1016  . chlorhexidine (PERIDEX) 0.12 %  solution 15 mL  15 mL Mouth Rinse BID Agarwala, Ravi, MD   15 mL at 05/03/20 1104  . Chlorhexidine Gluconate Cloth 2 % PADS 6 each  6 each Topical Daily Kipp Brood, MD   6 each at 05/03/20 1115  . cholecalciferol (VITAMIN D3) tablet 2,000 Units  2,000 Units Oral Daily Kipp Brood, MD   2,000 Units at 05/03/20 1016  . citalopram (CELEXA) tablet 40 mg  40 mg Oral Daily Agarwala, Einar Grad, MD   40 mg at 05/03/20 1016  . diazepam (VALIUM) tablet 1 mg  1 mg Oral BID Kipp Brood, MD   1 mg at 05/03/20 1024  . diphenhydrAMINE (BENADRYL) capsule 25  mg  25 mg Oral Q8H PRN Kipp Brood, MD      . docusate sodium (COLACE) capsule 100 mg  100 mg Oral BID PRN Kipp Brood, MD      . docusate sodium (COLACE) capsule 100 mg  100 mg Oral Daily Agarwala, Ravi, MD   100 mg at 05/03/20 1026  . enoxaparin (LOVENOX) injection 40 mg  40 mg Subcutaneous QHS Kipp Brood, MD   40 mg at 05/02/20 2146  . ferrous sulfate 300 (60 Fe) MG/5ML syrup 300 mg  300 mg Oral QHS Kipp Brood, MD   300 mg at 05/02/20 2145  . fluticasone (FLONASE) 50 MCG/ACT nasal spray 1 spray  1 spray Each Nare Daily Agarwala, Ravi, MD   1 spray at 05/03/20 1115  . guaiFENesin (ROBITUSSIN) 100 MG/5ML solution 200 mg  200 mg Oral QID PRN Kipp Brood, MD   200 mg at 05/01/20 2139  . HYDROcodone-acetaminophen (NORCO/VICODIN) 5-325 MG per tablet 1 tablet  1 tablet Oral BID Kipp Brood, MD   1 tablet at 05/03/20 1016  . HYDROcodone-acetaminophen (NORCO/VICODIN) 5-325 MG per tablet 1 tablet  1 tablet Oral Daily PRN Kipp Brood, MD   1 tablet at 04/30/20 2029  . loperamide (IMODIUM) capsule 2 mg  2 mg Oral QID PRN Kipp Brood, MD      . MEDLINE mouth rinse  15 mL Mouth Rinse q12n4p Kipp Brood, MD   15 mL at 05/02/20 1826  . OLANZapine (ZYPREXA) tablet 1.25 mg  1.25 mg Oral QHS Kipp Brood, MD   1.25 mg at 05/02/20 2146  . ondansetron (ZOFRAN) tablet 4 mg  4 mg Oral Q8H PRN Agarwala, Einar Grad, MD      . pantoprazole (PROTONIX) EC tablet 40 mg  40 mg Oral BID Kipp Brood, MD   40 mg at 05/03/20 1116  . polyethylene glycol (MIRALAX / GLYCOLAX) packet 17 g  17 g Oral Daily PRN Kipp Brood, MD         Discharge Medications: Please see discharge summary for a list of discharge medications.  Relevant Imaging Results:  Relevant Lab Results:   Additional Information SS#[id number] [place], [place]

## 2020-05-03 NOTE — TOC Progression Note (Signed)
Transition of Care [facility]) - Progression Note   Patient Details  Name: [name] MRN: [id number] Date of Birth: [birth year]  Transition of Care [facility]) CM/SW [place], [place] Phone Number: 05/03/2020, 12:47 PM  Clinical Narrative:    [facility] team continuing to follow, pt will need new COVID 24-48 hrs prior to d/c. LTC resident at [facility], have updated admissions liaison.    Expected Discharge Plan: [place] Barriers to Discharge: Continued Medical Work up  Expected Discharge Plan and Services Expected Discharge Plan: [place] In-house Referral: Clinical Social Work Discharge Planning Services: CM Consult Post Acute Care Choice: Resumption of Svcs/PTA Provider, [place] Living arrangements for the past 2 months: [place]                 Readmission Risk Interventions Readmission Risk Prevention Plan 04/28/2020  Transportation Screening Complete  PCP or Specialist Appt within 5-7 Days Not Complete  Not Complete comments SNF resident  Home Care Screening Not Complete  Home Care Screening Not Completed Comments SNF resident  Medication Review (RN CM) Referral to Pharmacy  Some recent data might be hidden

## 2020-05-03 NOTE — Progress Notes (Signed)
PROGRESS NOTE  [name] AJO:[id number] DOB: May 19, 1936 DOA: 04/26/2020 PCP: Reynold Bowen, MD  Brief History   84 year old female [place] home resident followed by [facility] for palliative care in the outpatient setting. The patient has a past medical history significant for prior GI bleed with coffee-ground emesis. She was found to have a massive hiatal hernia in EGD performed in 2015 with gastritis. She had an admission for a similar complaint in 2013 when she was found to have C Diff colitis. Past medical history is also significant for cerebral palsy with lower extremity contractures.   She was sent to the ED by her facility after aspiration of solid foods at the nursing home. At admission she was found to be hypoxic with Sao2 of 70% and CXR compatible with pneumonitis. She was admitted to the ICU and given BIPAP and zosyn.  History of cerebral palsy with lower extremity contractures Patient aspirated solids at her nursing home came to the emergency room found to have an O2 sat 70% chest x-ray showed diffuse interstitial markings without prior film for pneumonitis-was transferred to the ICU and patient was started on BiPAP, Zosyn Transferred to hospitalist service 9/29-started on IV resuscitation transitioned off BiPAP placed on nonrebreather  She was transferred to the hospitalists service on 04/28/2020. She has been transitioned off of BIPAP to nasal cannula. She is currently saturating 90% on nasal cannula at 4L.  Consultants  . PCCM  Procedures  . None  Antibiotics   Anti-infectives (From admission, onward)   Start     Dose/Rate Route Frequency Ordered Stop   04/26/20 2300  piperacillin-tazobactam (ZOSYN) IVPB 3.375 g  Status:  Discontinued        3.375 g 12.5 mL/hr over 240 Minutes Intravenous Every 8 hours 04/26/20 2228 04/27/20 1020     Subjective  The patient is resting comfortably. She continues to complain of difficulty breathing. She is very  weak.  Objective   Vitals:  Vitals:   05/03/20 1000 05/03/20 1446  BP:  (!) 117/59  Pulse:  78  Resp:  18  Temp:  98 F (36.7 C)  SpO2: (!) 87% 92%  Exam:  Constitutional:  . The patient is awake, alert, and oriented x 3. No acute distress. Respiratory:  . No increased work of breathing. . No wheezes, rales, or rhonchi . No tactile fremitus Cardiovascular:  . Regular rate and rhythm . No murmurs, ectopy, or gallups. . No lateral PMI. No thrills. Abdomen:  . Abdomen is soft, non-tender, non-distended . No hernias, masses, or organomegaly . Normoactive bowel sounds.  Musculoskeletal:  . No cyanosis, clubbing, or edema Skin:  . No rashes, lesions, ulcers . palpation of skin: no induration or nodules Neurologic:  . CN 2-12 intact . Sensation all 4 extremities intact Psychiatric:  . Mental status o Mood, affect appropriate o Orientation to person, place, time  . judgment and insight appear intact  I have personally reviewed the following:   Today's Data  . Vitals, CMP, CBC  Micro Data  . MRSA by PCR: Negative  Scheduled Meds: . atorvastatin  10 mg Oral Daily  . bisacodyl  10 mg Rectal Q3 days  . busPIRone  15 mg Oral TID  . chlorhexidine  15 mL Mouth Rinse BID  . Chlorhexidine Gluconate Cloth  6 each Topical Daily  . cholecalciferol  2,000 Units Oral Daily  . citalopram  40 mg Oral Daily  . diazepam  1 mg Oral BID  . docusate sodium  100 mg Oral Daily  . enoxaparin (LOVENOX) injection  40 mg Subcutaneous QHS  . ferrous sulfate  300 mg Oral QHS  . fluticasone  1 spray Each Nare Daily  . HYDROcodone-acetaminophen  1 tablet Oral BID  . mouth rinse  15 mL Mouth Rinse q12n4p  . OLANZapine  1.25 mg Oral QHS  . pantoprazole  40 mg Oral BID   Continuous Infusions:  Active Problems:   Aspiration pneumonia (HCC)   LOS: 7 days   A & P   Acute on chronic respiratory failure secondary to aspiration pneumonitis: Initially was on high flow nasal cannula  high-dose 14 L-currently just on simple nasal cannula at 4L. Saturating at 90%. Not bacterial infection Zosyn discontinued by PCCM. Avoid BiPAP given dysphagia and risk for worsening underlying condition. Aggressive pulmonary toileting--consider scopolamine patch for secretions. Saline lock IV fluid daily.  Chronic dysphagia: Repeat chest x-ray was ordered given mild leukocytosis and continued hypoxemia. It has demonstrated worsening dense patchy bilateral lower lobe consolidatino and hazy left parahilar lung opacity compatible with worsening multilobar pneumonia. Would restart Zosyn. Will also consult SLP to evaluate swallow.   Hypokalemia and hypomagnesemia: Resolved with replacement. 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 Continue Valium 1 mg twice daily.  Depression: On multiple meds BuSpar 15mg  3 times daily Celexa 40 daily olanzapine 1.25 HF.  I have seen and examined this patient myself. I have spent 32 minutes in her evaluation and care.  DVT Prophylaxis: Lovenox CODE STATUS: DNR Family Communication: None available Disposition: From nursing facility. Anticipate discharge back to nursing facility. Status is: Inpatient  Remains inpatient appropriate because:Inpatient level of care appropriate due to severity of illness  Dispo: The patient is from: SNF              Anticipated d/c is to: SNF              Anticipated d/c date is: 3 days              Patient currently is not medically stable to d/c.  Zyah Gomm, DO [facility] Direct contact: see [email or web address]  7PM-7AM contact night coverage as above 05/03/2020, 3:59 PM  LOS: 6 days

## 2020-05-03 NOTE — Plan of Care (Signed)
  Problem: Education: Goal: Knowledge of General Education information will improve Description Including pain rating scale, medication(s)/side effects and non-pharmacologic comfort measures Outcome: Progressing   

## 2020-05-04 ENCOUNTER — Inpatient Hospital Stay (HOSPITAL_COMMUNITY): Payer: Medicare Other

## 2020-05-04 LAB — PROCALCITONIN: Procalcitonin: 0.26 ng/mL

## 2020-05-04 MED ORDER — RESOURCE THICKENUP CLEAR PO POWD
ORAL | Status: DC | PRN
  Filled 2020-05-04: qty 125

## 2020-05-04 NOTE — Care Management Important Message (Signed)
Important Message  Patient Details  Name: [name] MRN: [id number] Date of Birth: [birth year]   Medicare Important Message Given:  Yes     Orbie Pyo 05/04/2020, 4:40 PM

## 2020-05-04 NOTE — TOC Progression Note (Signed)
Transition of Care [facility]) - Progression Note    Patient Details  Name: [name] MRN: [id number] Date of Birth: [birth year]  Transition of Care [facility]) CM/SW [place], [place] Phone Number: 05/04/2020, 11:43 AM  Clinical Narrative:    [facility] team continues to follow for pt readiness to return to [facility]. Pt still not stable for d/c, provided update to admissions liaison Janie at [facility].    Expected Discharge Plan: [place] Barriers to Discharge: Continued Medical Work up  Expected Discharge Plan and Services Expected Discharge Plan: [place] In-house Referral: Clinical Social Work Discharge Planning Services: CM Consult Post Acute Care Choice: Resumption of Svcs/PTA Provider, [place] Living arrangements for the past 2 months: [place]  Readmission Risk Interventions Readmission Risk Prevention Plan 04/28/2020  Transportation Screening Complete  PCP or Specialist Appt within 5-7 Days Not Complete  Not Complete comments SNF resident  Home Care Screening Not Complete  Home Care Screening Not Completed Comments SNF resident  Medication Review (RN CM) Referral to Pharmacy  Some recent data might be hidden

## 2020-05-04 NOTE — Progress Notes (Signed)
Modified Barium Swallow Progress Note  Patient Details  Name: [name] MRN: [id number] Date of Birth: Sep 09, 1935  Today's Date: 05/04/2020  Modified Barium Swallow completed.  Full report located under Chart Review in the Imaging Section.  Brief recommendations include the following:  Clinical Impression  MBS revealed oropharyngeal dysphagia as characterized by prolonged oral transit, pharyngeal residue, multiple swallows, and several instances of penetration/aspiration. She demonstrates a significantly prolonged oral transit across all POs. Pt demonstrated timely swallow initiation once bolus was transited posteriorly. She failed to initiate a swallow with a soft solid and regular solid, and POs had to be removed from oral cavity. Pt may have demonstrated one instance of penetration/aspiration on the first sip of nectar-thick liquid followed by a cough response, but MBS did not fully visualize due to pt's shoulder obscuring view. Trace flash penetration was also observed with nectar. Pt demonstrated multiple swallows with nectar. Pt demonstrated at least frank penetration on the only sip of thin liquid followed by an immediate cough response, but view was obscured by pt's shoulder. Significant pharyngeal residue was observed in the valleculae and lateral channels across POs. Recommend dys 1 (puree) diet and nectar thick liquids. Pt should be seated fully upright during all meals.    Swallow Evaluation Recommendations       SLP Diet Recommendations: Dysphagia 1 (Puree) solids;Nectar thick liquid   Liquid Administration via: Straw;Cup   Medication Administration: Crushed with puree   Supervision: Staff to assist with self feeding   Compensations: Slow rate;Small sips/bites   Postural Changes: Seated upright at 90 degrees;Remain semi-upright after after feeds/meals (Comment)   Oral Care Recommendations: Oral care QID        Greggory Keen 05/04/2020,11:37 AM

## 2020-05-04 NOTE — Progress Notes (Addendum)
PROGRESS NOTE  [name] IHK:[id number] DOB: February 18, 1936 DOA: 04/26/2020 PCP: Reynold Bowen, MD  Brief History   84 year old female [place] home resident followed by [facility] for palliative care in the outpatient setting. The patient has a past medical history significant for prior GI bleed with coffee-ground emesis. She was found to have a massive hiatal hernia in EGD performed in 2015 with gastritis. She had an admission for a similar complaint in 2013 when she was found to have C Diff colitis. Past medical history is also significant for cerebral palsy with lower extremity contractures.   She was sent to the ED by her facility after aspiration of solid foods at the nursing home. At admission she was found to be hypoxic with Sao2 of 70% and CXR compatible with pneumonitis. She was admitted to the ICU and given BIPAP and zosyn.  History of cerebral palsy with lower extremity contractures. Patient aspirated solids at her nursing home came to the emergency room found to have an O2 sat 70% chest x-ray showed diffuse interstitial markings without prior film for pneumonitis-was transferred to the ICU and patient was started on BiPAP, Zosyn Transferred to hospitalist service 9/29-started on IV resuscitation transitioned off BiPAP placed on nonrebreather  She was transferred to the hospitalists service on 04/28/2020. She has been transitioned off of BIPAP to nasal cannula. She is currently saturating 95% on nasal cannula at 5L.  Consultants  . PCCM  Procedures  . None  Antibiotics   Anti-infectives (From admission, onward)   Start     Dose/Rate Route Frequency Ordered Stop   04/26/20 2300  piperacillin-tazobactam (ZOSYN) IVPB 3.375 g  Status:  Discontinued        3.375 g 12.5 mL/hr over 240 Minutes Intravenous Every 8 hours 04/26/20 2228 04/27/20 1020     Subjective  The patient is resting comfortably. No new complaints.  Objective   Vitals:  Vitals:   05/04/20 1045  05/04/20 1331  BP: (!) 109/55 (!) 105/56  Pulse: 86 70  Resp: 16 20  Temp: 98 F (36.7 C) 98.6 F (37 C)  SpO2: 90% 95%  Exam:  Constitutional:  . The patient is awake, alert, and oriented x 3. No acute distress. Respiratory:  . No increased work of breathing. . No wheezes, rales, or rhonchi . No tactile fremitus Cardiovascular:  . Regular rate and rhythm . No murmurs, ectopy, or gallups. . No lateral PMI. No thrills. Abdomen:  . Abdomen is soft, non-tender, non-distended . No hernias, masses, or organomegaly . Normoactive bowel sounds.  Musculoskeletal:  . No cyanosis, clubbing, or edema Skin:  . No rashes, lesions, ulcers . palpation of skin: no induration or nodules Neurologic:  . CN 2-12 intact . Sensation all 4 extremities intact Psychiatric:  . Mental status o Mood, affect appropriate o Orientation to person, place, time  . judgment and insight appear intact  I have personally reviewed the following:   Today's Data  . Vitals, CMP, CBC  Micro Data  . MRSA by PCR: Negative  Scheduled Meds: . atorvastatin  10 mg Oral Daily  . bisacodyl  10 mg Rectal Q3 days  . busPIRone  15 mg Oral TID  . chlorhexidine  15 mL Mouth Rinse BID  . cholecalciferol  2,000 Units Oral Daily  . citalopram  40 mg Oral Daily  . diazepam  1 mg Oral BID  . docusate sodium  100 mg Oral Daily  . enoxaparin (LOVENOX) injection  40 mg Subcutaneous QHS  .  ferrous sulfate  300 mg Oral QHS  . fluticasone  1 spray Each Nare Daily  . HYDROcodone-acetaminophen  1 tablet Oral BID  . mouth rinse  15 mL Mouth Rinse q12n4p  . OLANZapine  1.25 mg Oral QHS  . pantoprazole  40 mg Oral BID   Continuous Infusions:  Active Problems:   Aspiration pneumonia (HCC)   LOS: 8 days   A & P   Acute on chronic respiratory failure secondary to aspiration pneumonitis: Initially was on high flow nasal cannula high-dose 14 L-currently just on simple nasal cannula at 4L. Saturating at 90%. Not bacterial  infection Zosyn discontinued by PCCM. Avoid BiPAP given dysphagia and risk for worsening underlying condition. Aggressive pulmonary toileting--consider scopolamine patch for secretions. Saline lock IV fluid daily.  Chronic dysphagia: Repeat chest x-ray was ordered given mild leukocytosis and continued hypoxemia. It has demonstrated worsening dense patchy bilateral lower lobe consolidatino and hazy left parahilar lung opacity compatible with worsening multilobar pneumonia. Would restart Zosyn. Will also consult SLP to evaluate swallow.   Hypokalemia and hypomagnesemia: Resolved with replacement. 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 Continue Valium 1 mg twice daily.  Depression: On multiple meds BuSpar 15mg  3 times daily Celexa 40 daily olanzapine 1.25 HF.  I have seen and examined this patient myself. I have spent 32 minutes in her evaluation and care.  DVT Prophylaxis: Lovenox CODE STATUS: DNR Family Communication: None available Disposition: From nursing facility. Anticipate discharge back to nursing facility. Status is: Inpatient  Remains inpatient appropriate because:Inpatient level of care appropriate due to severity of illness  Dispo: The patient is from: SNF              Anticipated d/c is to: SNF              Anticipated d/c date is: 3 days              Patient currently is not medically stable to d/c.  Rohin Krejci, DO [facility] Direct contact: see [email or web address]  7PM-7AM contact night coverage as above 05/04/2020, 6:56 PM  LOS: 6 days

## 2020-05-05 DIAGNOSIS — R0989 Other specified symptoms and signs involving the circulatory and respiratory systems: Secondary | ICD-10-CM | POA: Diagnosis not present

## 2020-05-05 DIAGNOSIS — J9601 Acute respiratory failure with hypoxia: Secondary | ICD-10-CM

## 2020-05-05 DIAGNOSIS — R0902 Hypoxemia: Secondary | ICD-10-CM

## 2020-05-05 MED ORDER — PIPERACILLIN-TAZOBACTAM 3.375 G IVPB
3.3750 g | Freq: Three times a day (TID) | INTRAVENOUS | Status: DC
  Administered 2020-05-05 – 2020-05-06 (×3): 3.375 g via INTRAVENOUS
  Filled 2020-05-05 (×3): qty 50

## 2020-05-05 MED ORDER — PIPERACILLIN-TAZOBACTAM 3.375 G IVPB 30 MIN: 3.3750 g | Freq: Three times a day (TID) | INTRAVENOUS | Status: DC

## 2020-05-05 NOTE — Progress Notes (Signed)
PROGRESS NOTE    [name]  OZH:[id number] DOB: Jul 27, 1936 DOA: 04/26/2020 PCP: Reynold Bowen, MD   Brief Narrative: 84 year old female [facility] resident followed by [facility] for palliative care in the outpatient setting. The patient has a past medical history significant for prior GI bleed with coffee-ground emesis. She was found to have a massive hiatal hernia in EGD performed in 2015 with gastritis. She had an admission for a similar complaint in 2013 when she was found to have C Diff colitis. Past medical history is also significant for cerebral palsy with lower extremity contractures.   She was sent to the ED by her facility after aspiration of solid foods at the nursing home. At admission she was found to be hypoxic with Sao2 of 70% and CXR compatible with pneumonitis. She was admitted to the ICU and given BIPAP and zosyn.  History of cerebral palsy with lower extremity contractures. Patient aspirated solids at her nursing home came to the emergency room found to have an O2 sat 70% chest x-ray showed diffuse interstitial markings without prior filmfor pneumonitis-was transferred to the ICU and patient was started on BiPAP, Zosyn Transferred to hospitalist service 9/29-started on IV resuscitation transitioned off BiPAP placed on nonrebreather  She was transferred to the hospitalists service on 04/28/2020. She has been transitioned off of BIPAP to nasal cannula. She is currently saturating 95% on nasal cannula at 5L.  Assessment & Plan:   Active Problems:   Aspiration pneumonia (HCC)   #1 acute on chronic hypoxic respiratory failure secondary to aspiration pneumonitis-patient remains on 5 L of nasal cannula for saturation 91%. Zosyn restarted given leukocytosis and continued hypoxemia. 05/03/2020 chest x-ray showed worsening bilateral patchy consolidation. Speech therapy consulted- Check pulse ox on room air Patient has chronic dysphagia Encouraged to use  incentive spirometry  #2 chronic contractures/cerebral palsy on as needed Valium  #3 depression on BuSpar and Celexa and olanzapine   Estimated body mass index is 22.12 kg/m as calculated from the following:   Height as of 03/25/12: 4\' 10"  (1.473 m).   Weight as of this encounter: 48 kg.  DVT prophylaxis: Lovenox  code Status: DNR Family Communication: None at bedside  disposition Plan:  Status is: Inpatient  Dispo: The patient is from: SNF              Anticipated d/c is to: SNF              Anticipated d/c date is: 2 days              Patient currently is not medically stable to d/c.  Patient remains hypoxic requiring 4 to 5 L by nasal cannula   Consultants: None  Procedures: None Antimicrobials: Zosyn  Subjective: She is resting in bed she is awake alert Talking needs help with feeding and activities of daily living She feels her breathing is better today than yesterday however still continues with cough and shortness of breath  Objective: Vitals:   05/04/20 1919 05/04/20 2051 05/05/20 0414 05/05/20 0500  BP: 128/61 119/63 (!) 112/55   Pulse: 87 87 67   Resp: 19 20 18    Temp: 98.3 F (36.8 C) 98.2 F (36.8 C) 98.7 F (37.1 C)   TempSrc: Oral Oral    SpO2: 90% 96% 94%   Weight:    48 kg    Intake/Output Summary (Last 24 hours) at 05/05/2020 1445 Last data filed at 05/05/2020 0900 Gross per 24 hour  Intake 240 ml  Output 100 ml  Net 140 ml   Filed Weights   05/03/20 0343 05/04/20 0327 05/05/20 0500  Weight: 47.8 kg 48 kg 48 kg    Examination:  General exam: Appears calm and comfortable  Respiratory system: coarse breath sounds to auscultation. Respiratory effort normal. Cardiovascular system: S1 & S2 heard, RRR. No JVD, murmurs, rubs, gallops or clicks. No pedal edema. Gastrointestinal system: Abdomen is nondistended, soft and nontender. No organomegaly or masses felt. Normal bowel sounds heard. Central nervous system: Alert and oriented. No focal  neurological deficits. Extremities: Symmetric 5 x 5 power. Skin: No rashes, lesions or ulcers Psychiatry: Judgement and insight appear normal. Mood & affect appropriate.     Data Reviewed: I have personally reviewed following labs and imaging studies  CBC: Recent Labs  Lab 04/30/20 0729 05/01/20 0302 05/02/20 0000 05/03/20 0206  WBC 16.0* 14.1* 14.5* 11.4*  NEUTROABS 12.6* 10.0* 9.1* 5.9  HGB 10.4* 11.0* 10.8* 11.1*  HCT 33.0* 34.1* 34.3* 34.7*  MCV 94.0 91.4 91.5 92.0  PLT 172 176 203 836   Basic Metabolic Panel: Recent Labs  Lab 04/29/20 0204 04/30/20 0345 05/01/20 0302 05/02/20 0000 05/03/20 0206  NA 143 143 140 140 140  K 3.2* 3.5 3.5 3.6 3.5  CL 111 111 108 106 106  CO2 24 25 22 25 28   GLUCOSE 104* 108* 108* 116* 111*  BUN 15 12 9 12 12   CREATININE 0.54 0.47 0.45 0.72 0.56  CALCIUM 8.6* 8.4* 8.6* 8.5* 8.4*  MG 1.8  --   --   --   --    GFR: CrCl cannot be calculated (Unknown ideal weight.). Liver Function Tests: Recent Labs  Lab 05/01/20 0302 05/02/20 0000 05/03/20 0206  AST 23 21 31   ALT 21 22 33  ALKPHOS 100 119 167*  BILITOT 0.9 0.7 0.8  PROT 5.3* 5.4* 5.5*  ALBUMIN 2.3* 2.2* 2.2*   No results for input(s): LIPASE, AMYLASE in the last 168 hours. No results for input(s): AMMONIA in the last 168 hours. Coagulation Profile: No results for input(s): INR, PROTIME in the last 168 hours. Cardiac Enzymes: No results for input(s): CKTOTAL, CKMB, CKMBINDEX, TROPONINI in the last 168 hours. BNP (last 3 results) No results for input(s): PROBNP in the last 8760 hours. HbA1C: No results for input(s): HGBA1C in the last 72 hours. CBG: No results for input(s): GLUCAP in the last 168 hours. Lipid Profile: No results for input(s): CHOL, HDL, LDLCALC, TRIG, CHOLHDL, LDLDIRECT in the last 72 hours. Thyroid Function Tests: No results for input(s): TSH, T4TOTAL, FREET4, T3FREE, THYROIDAB in the last 72 hours. Anemia Panel: No results for input(s):  VITAMINB12, FOLATE, FERRITIN, TIBC, IRON, RETICCTPCT in the last 72 hours. Sepsis Labs: Recent Labs  Lab 05/02/20 0829 05/03/20 0206 05/04/20 0134  PROCALCITON 0.36 0.31 0.26    Recent Results (from the past 240 hour(s))  Respiratory Panel by RT PCR (Flu A&B, Covid) - Nasopharyngeal Swab     Status: None   Collection Time: 04/26/20  8:35 PM   Specimen: Nasopharyngeal Swab  Result Value Ref Range Status   SARS Coronavirus 2 by RT PCR NEGATIVE NEGATIVE Final    Comment: (NOTE) SARS-CoV-2 target nucleic acids are NOT DETECTED.  The SARS-CoV-2 RNA is generally detectable in upper respiratoy specimens during the acute phase of infection. The lowest concentration of SARS-CoV-2 viral copies this assay can detect is 131 copies/mL. A negative result does not preclude SARS-Cov-2 infection and should not be used as the sole basis for treatment  or other patient management decisions. A negative result may occur with  improper specimen collection/handling, submission of specimen other than nasopharyngeal swab, presence of viral mutation(s) within the areas targeted by this assay, and inadequate number of viral copies (<131 copies/mL). A negative result must be combined with clinical observations, patient history, and epidemiological information. The expected result is Negative.  Fact Sheet for Patients:  PinkCheek.be  Fact Sheet for Healthcare Providers:  GravelBags.it  This test is no t yet approved or cleared by the [place] FDA and  has been authorized for detection and/or diagnosis of SARS-CoV-2 by FDA under an Emergency Use Authorization (EUA). This EUA will remain  in effect (meaning this test can be used) for the duration of the COVID-19 declaration under Section 564(b)(1) of the Act, 21 U.S.C. section 360bbb-3(b)(1), unless the authorization is terminated or revoked sooner.     Influenza A by PCR NEGATIVE NEGATIVE  Final   Influenza B by PCR NEGATIVE NEGATIVE Final    Comment: (NOTE) The Xpert Xpress SARS-CoV-2/FLU/RSV assay is intended as an aid in  the diagnosis of influenza from Nasopharyngeal swab specimens and  should not be used as a sole basis for treatment. Nasal washings and  aspirates are unacceptable for Xpert Xpress SARS-CoV-2/FLU/RSV  testing.  Fact Sheet for Patients: PinkCheek.be  Fact Sheet for Healthcare Providers: GravelBags.it  This test is not yet approved or cleared by the [place] FDA and  has been authorized for detection and/or diagnosis of SARS-CoV-2 by  FDA under an Emergency Use Authorization (EUA). This EUA will remain  in effect (meaning this test can be used) for the duration of the  Covid-19 declaration under Section 564(b)(1) of the Act, 21  U.S.C. section 360bbb-3(b)(1), unless the authorization is  terminated or revoked. Performed at [facility], [place] [address] [place], [place] [address]   MRSA PCR Screening     Status: None   Collection Time: 04/27/20  6:52 AM   Specimen: Nasal Mucosa; Nasopharyngeal  Result Value Ref Range Status   MRSA by PCR NEGATIVE NEGATIVE Final    Comment:        The GeneXpert MRSA Assay (FDA approved for NASAL specimens only), is one component of a comprehensive MRSA colonization surveillance program. It is not intended to diagnose MRSA infection nor to guide or monitor treatment for MRSA infections. Performed at [facility], [place] [address], [place], [place] [address]          Radiology Studies: DG Swallowing Func-Speech Pathology  Result Date: 05/04/2020 Study completed and documented by Greggory Keen, SLP student Supervised and reviewed by Herbie Baltimore MA CCC-SLP Objective Swallowing Evaluation: Type of Study: MBS-Modified Barium Swallow Study  Patient Details Name: PRESLEA RHODUS MRN: [id number] Date of Birth: [birth year] Today's Date:  05/04/2020 Time: SLP Start Time (ACUTE ONLY): 1015 -SLP Stop Time (ACUTE ONLY): 1045 SLP Time Calculation (min) (ACUTE ONLY): 30 min Past Medical History: Past Medical History: Diagnosis Date . Cerebral palsy ([place])  . Constipation  . Hyperlipidemia  Past Surgical History: Past Surgical History: Procedure Laterality Date . ESOPHAGOGASTRODUODENOSCOPY  01/28/2012  Procedure: ESOPHAGOGASTRODUODENOSCOPY (EGD);  Surgeon: Lear Ng, MD;  Location: [facility] ENDOSCOPY;  Service: Endoscopy;  Laterality: N/A; . ESOPHAGOGASTRODUODENOSCOPY N/A 05/10/2014  Procedure: ESOPHAGOGASTRODUODENOSCOPY (EGD);  Surgeon: Missy Sabins, MD;  Location: [facility] ENDOSCOPY;  Service: Endoscopy;  Laterality: N/A; . right hip surgery   . TONSILLECTOMY   HPI: 84 year old female from [facility] home admitted after aspiration of solid  food. Per MD note resident followed by [facility] for palliative care in the outpatient setting. PMH: cerebral palsy, GI bleed with coffee-ground emesis, massive hiatal hernia in EGD performed in 2015 with gastritis. MBS 2018 flash penetration, mild oral dysphagia, Dys 3/thin recommended. MBS 2019 triggers at pyriforms with penetration before swallow, fully ejected penetrate, continue Dys 3/thin.  No data recorded Assessment / Plan / Recommendation CHL IP CLINICAL IMPRESSIONS 05/04/2020 Clinical Impression MBS revealed oropharyngeal dysphagia as characterized by prolonged oral transit, pharyngeal residue, multiple swallows, and multiple instances of penetration/aspiration. She demonstrates a significantly prolonged oral transit across all POs. However, after a prolonged oral transit, her swallow was timely. She failed to initiate a swallow with a soft solid and regular solid, and POs had to be removed from oral cavity. Pt may have demonstrated one instance of penetration/aspiration on the first sip of nectar-thick liquid followed by a cough response. Trace flash penetration was also observed with nectar. Pt  demonstrated multiple swallows with nectar. Pt may have demonstrated one instance of penetration/aspiration on the first sip of thin liquid followed by a cough response. Significant pharyngeal residue was observed in the valleculae and lateral channels across POs. Recommend dys 1 (puree) diet and nectar thick liquids. Pt should be seated fully upright during all meals.  SLP Visit Diagnosis Dysphagia, oropharyngeal phase (R13.12) Attention and concentration deficit following -- Frontal lobe and executive function deficit following -- Impact on safety and function Moderate aspiration risk   CHL IP TREATMENT RECOMMENDATION 05/04/2020 Treatment Recommendations Therapy as outlined in treatment plan below   Prognosis 05/04/2020 Prognosis for Safe Diet Advancement Fair Barriers to Reach Goals Cognitive deficits Barriers/Prognosis Comment -- CHL IP DIET RECOMMENDATION 05/04/2020 SLP Diet Recommendations Dysphagia 1 (Puree) solids;Nectar thick liquid Liquid Administration via Straw;Cup Medication Administration Crushed with puree Compensations Slow rate;Small sips/bites Postural Changes Seated upright at 90 degrees;Remain semi-upright after after feeds/meals (Comment)   CHL IP OTHER RECOMMENDATIONS 05/04/2020 Recommended Consults -- Oral Care Recommendations Oral care QID Other Recommendations --   CHL IP FOLLOW UP RECOMMENDATIONS 05/04/2020 Follow up Recommendations Skilled Nursing facility   [facility] IP FREQUENCY AND DURATION 05/04/2020 Speech Therapy Frequency (ACUTE ONLY) min 2x/week Treatment Duration 2 weeks      CHL IP ORAL PHASE 05/04/2020 Oral Phase Impaired Oral - Pudding Teaspoon -- Oral - Pudding Cup -- Oral - Honey Teaspoon -- Oral - Honey Cup -- Oral - Nectar Teaspoon -- Oral - Nectar Cup -- Oral - Nectar Straw Delayed oral transit Oral - Thin Teaspoon -- Oral - Thin Cup -- Oral - Thin Straw Delayed oral transit Oral - Puree Delayed oral transit Oral - Mech Soft Delayed oral transit Oral - Regular Delayed oral transit Oral  - Multi-Consistency -- Oral - Pill -- Oral Phase - Comment --  CHL IP PHARYNGEAL PHASE 05/04/2020 Pharyngeal Phase Impaired Pharyngeal- Pudding Teaspoon -- Pharyngeal -- Pharyngeal- Pudding Cup -- Pharyngeal -- Pharyngeal- Honey Teaspoon -- Pharyngeal -- Pharyngeal- Honey Cup -- Pharyngeal -- Pharyngeal- Nectar Teaspoon -- Pharyngeal -- Pharyngeal- Nectar Cup -- Pharyngeal -- Pharyngeal- Nectar Straw Penetration/Aspiration during swallow;Trace aspiration;Pharyngeal residue - valleculae;Lateral channel residue Pharyngeal -- Pharyngeal- Thin Teaspoon -- Pharyngeal -- Pharyngeal- Thin Cup -- Pharyngeal -- Pharyngeal- Thin Straw Penetration/Aspiration during swallow;Pharyngeal residue - valleculae;Lateral channel residue Pharyngeal -- Pharyngeal- Puree Pharyngeal residue - valleculae;Lateral channel residue Pharyngeal -- Pharyngeal- Mechanical Soft (No Data) Pharyngeal -- Pharyngeal- Regular (No Data) Pharyngeal -- Pharyngeal- Multi-consistency -- Pharyngeal -- Pharyngeal- Pill -- Pharyngeal -- Pharyngeal Comment --  CHL IP  CERVICAL ESOPHAGEAL PHASE 11/06/2016 Cervical Esophageal Phase WFL Pudding Teaspoon -- Pudding Cup -- Honey Teaspoon -- Honey Cup -- Nectar Teaspoon -- Nectar Cup -- Nectar Straw -- Thin Teaspoon -- Thin Cup -- Thin Straw -- Puree -- Mechanical Soft -- Regular -- Multi-consistency -- Pill -- Cervical Esophageal Comment -- DeBlois, Katherene Ponto 05/04/2020, 11:49 AM                   Scheduled Meds: . atorvastatin  10 mg Oral Daily  . bisacodyl  10 mg Rectal Q3 days  . busPIRone  15 mg Oral TID  . chlorhexidine  15 mL Mouth Rinse BID  . cholecalciferol  2,000 Units Oral Daily  . citalopram  40 mg Oral Daily  . diazepam  1 mg Oral BID  . docusate sodium  100 mg Oral Daily  . enoxaparin (LOVENOX) injection  40 mg Subcutaneous QHS  . ferrous sulfate  300 mg Oral QHS  . fluticasone  1 spray Each Nare Daily  . HYDROcodone-acetaminophen  1 tablet Oral BID  . mouth rinse  15 mL Mouth Rinse  q12n4p  . OLANZapine  1.25 mg Oral QHS  . pantoprazole  40 mg Oral BID   Continuous Infusions:   LOS: 9 days     Georgette Shell, MD  05/05/2020, 2:45 PM

## 2020-05-05 NOTE — Progress Notes (Signed)
On room air patient desaturates to 85%. While on 4L O2 [place] patient saturation at 90-91%.

## 2020-05-05 NOTE — Progress Notes (Signed)
  Speech Language Pathology Treatment: Dysphagia  Patient Details Name: CATALYNA REILLY MRN: [id number] DOB: [birth year] Today's Date: 05/05/2020 Time: 9379-0240 SLP Time Calculation (min) (ACUTE ONLY): 15 min  Assessment / Plan / Recommendation Clinical Impression  Pt was seen for dysphagia tx following recent MBS. She was directly observed with nectar-thick liquid, puree, and regular solid. No prolonged mastication/bolus manipulation was observed as compared to previous sessions. No overt s/sx of aspiration were noted with any POs. Given pt's progress, recommend dys 3 diet and nectar. Pt should continue to be seated fully upright during feeding.   HPI HPI: 84 year old female from [place] home admitted after aspiration of solid food. Per MD note resident followed by [facility] for palliative care in the outpatient setting. PMH: cerebral palsy, GI bleed with coffee-ground emesis, massive hiatal hernia in EGD performed in 2015 with gastritis. MBS 2018 flash penetration, mild oral dysphagia, Dys 3/thin recommended. MBS 2019 triggers at pyriforms with penetration before swallow, fully ejected penetrate, continue Dys 3/thin.       SLP Plan  Continue with current plan of care       Recommendations  Diet recommendations: Dysphagia 3 (mechanical soft);Nectar-thick liquid Liquids provided via: Cup;Straw Medication Administration: Crushed with puree Supervision: Staff to assist with self feeding Compensations: Slow rate;Small sips/bites Postural Changes and/or Swallow Maneuvers: Seated upright 90 degrees                Oral Care Recommendations: Oral care BID Follow up Recommendations: Skilled Nursing facility SLP Visit Diagnosis: Dysphagia, oropharyngeal phase (R13.12) Plan: Continue with current plan of care       GO                Greggory Keen 05/05/2020, 10:50 AM

## 2020-05-06 DIAGNOSIS — F341 Dysthymic disorder: Secondary | ICD-10-CM | POA: Diagnosis not present

## 2020-05-06 DIAGNOSIS — F331 Major depressive disorder, recurrent, moderate: Secondary | ICD-10-CM | POA: Diagnosis not present

## 2020-05-06 DIAGNOSIS — Z20828 Contact with and (suspected) exposure to other viral communicable diseases: Secondary | ICD-10-CM | POA: Diagnosis not present

## 2020-05-06 DIAGNOSIS — R1312 Dysphagia, oropharyngeal phase: Secondary | ICD-10-CM | POA: Diagnosis not present

## 2020-05-06 DIAGNOSIS — R29818 Other symptoms and signs involving the nervous system: Secondary | ICD-10-CM | POA: Diagnosis not present

## 2020-05-06 DIAGNOSIS — K5900 Constipation, unspecified: Secondary | ICD-10-CM | POA: Diagnosis not present

## 2020-05-06 DIAGNOSIS — K219 Gastro-esophageal reflux disease without esophagitis: Secondary | ICD-10-CM | POA: Diagnosis not present

## 2020-05-06 DIAGNOSIS — F0391 Unspecified dementia with behavioral disturbance: Secondary | ICD-10-CM | POA: Diagnosis not present

## 2020-05-06 DIAGNOSIS — F419 Anxiety disorder, unspecified: Secondary | ICD-10-CM | POA: Diagnosis not present

## 2020-05-06 DIAGNOSIS — D72829 Elevated white blood cell count, unspecified: Secondary | ICD-10-CM | POA: Diagnosis not present

## 2020-05-06 DIAGNOSIS — D509 Iron deficiency anemia, unspecified: Secondary | ICD-10-CM | POA: Diagnosis not present

## 2020-05-06 DIAGNOSIS — Z7401 Bed confinement status: Secondary | ICD-10-CM | POA: Diagnosis not present

## 2020-05-06 DIAGNOSIS — M2550 Pain in unspecified joint: Secondary | ICD-10-CM | POA: Diagnosis not present

## 2020-05-06 DIAGNOSIS — J690 Pneumonitis due to inhalation of food and vomit: Secondary | ICD-10-CM | POA: Diagnosis not present

## 2020-05-06 DIAGNOSIS — R278 Other lack of coordination: Secondary | ICD-10-CM | POA: Diagnosis not present

## 2020-05-06 DIAGNOSIS — I10 Essential (primary) hypertension: Secondary | ICD-10-CM | POA: Diagnosis not present

## 2020-05-06 DIAGNOSIS — G248 Other dystonia: Secondary | ICD-10-CM | POA: Diagnosis not present

## 2020-05-06 DIAGNOSIS — Z66 Do not resuscitate: Secondary | ICD-10-CM | POA: Diagnosis not present

## 2020-05-06 DIAGNOSIS — R5381 Other malaise: Secondary | ICD-10-CM | POA: Diagnosis not present

## 2020-05-06 DIAGNOSIS — K449 Diaphragmatic hernia without obstruction or gangrene: Secondary | ICD-10-CM | POA: Diagnosis not present

## 2020-05-06 DIAGNOSIS — R0902 Hypoxemia: Secondary | ICD-10-CM | POA: Diagnosis not present

## 2020-05-06 DIAGNOSIS — R1311 Dysphagia, oral phase: Secondary | ICD-10-CM | POA: Diagnosis not present

## 2020-05-06 DIAGNOSIS — M810 Age-related osteoporosis without current pathological fracture: Secondary | ICD-10-CM | POA: Diagnosis not present

## 2020-05-06 DIAGNOSIS — F329 Major depressive disorder, single episode, unspecified: Secondary | ICD-10-CM | POA: Diagnosis not present

## 2020-05-06 DIAGNOSIS — G809 Cerebral palsy, unspecified: Secondary | ICD-10-CM | POA: Diagnosis not present

## 2020-05-06 DIAGNOSIS — R0989 Other specified symptoms and signs involving the circulatory and respiratory systems: Secondary | ICD-10-CM | POA: Diagnosis not present

## 2020-05-06 DIAGNOSIS — J189 Pneumonia, unspecified organism: Secondary | ICD-10-CM | POA: Diagnosis not present

## 2020-05-06 DIAGNOSIS — R293 Abnormal posture: Secondary | ICD-10-CM | POA: Diagnosis not present

## 2020-05-06 DIAGNOSIS — M25561 Pain in right knee: Secondary | ICD-10-CM | POA: Diagnosis not present

## 2020-05-06 LAB — RESPIRATORY PANEL BY RT PCR (FLU A&B, COVID)
Influenza A by PCR: NEGATIVE
Influenza B by PCR: NEGATIVE
SARS Coronavirus 2 by RT PCR: NEGATIVE

## 2020-05-06 MED ORDER — AMOXICILLIN-POT CLAVULANATE 875-125 MG PO TABS: 1.0000 | ORAL_TABLET | Freq: Two times a day (BID) | ORAL | 0 refills | Status: AC

## 2020-05-06 MED ORDER — HYDROCODONE-ACETAMINOPHEN 5-325 MG PO TABS
1.0000 | ORAL_TABLET | Freq: Every day | ORAL | 0 refills | Status: DC | PRN
Start: 2020-05-06 — End: 2020-09-24

## 2020-05-06 NOTE — Social Work (Signed)
Clinical Social Worker facilitated patient discharge including contacting patient family and facility to confirm patient discharge plans.  Clinical information faxed to facility and family agreeable with plan.  CSW arranged ambulance transport via [facility] to [place] [facility]  RN to call [phone]  with report prior to discharge.  Clinical Social Worker will sign off for now as social work intervention is no longer needed. Please consult [place] again if new need arises.  Westley Hummer, MSW, LCSW Clinical Social Worker

## 2020-05-06 NOTE — Discharge Planning (Signed)
[name] to be D/C'd  per MD order. Discussed with the patient and all questions fully answered.  VSS, Skin clean, dry and intact without evidence of skin break down, no evidence of skin tears noted.  IV catheter discontinued intact. Site without signs and symptoms of complications. Dressing and pressure applied.  An After Visit Summary was printed and given to the patient. Patient received prescription.  D/c education completed with patient/family including follow up instructions, medication list, d/c activities limitations if indicated, with other d/c instructions as indicated by MD - patient able to verbalize understanding, all questions fully answered.   Patient instructed to return to ED, call 911, or call MD for any changes in condition.   Patient to be escorted via [place], and D/C home via private auto.

## 2020-05-06 NOTE — Discharge Planning (Signed)
Attempted to call report to facility, Freda Munro stated the RN would call back to the number given.

## 2020-05-06 NOTE — TOC Transition Note (Signed)
Transition of Care [facility]) - CM/SW Discharge Note   Patient Details  Name: [name] MRN: [id number] Date of Birth: 09/15/1935  [facility] CM/SW Contact:  Alexander Mt, LCSW Phone Number: 05/06/2020, 12:06 PM   Clinical Narrative:    CSW spoke with pt cousin Langley Gauss and updated her about d/c today, she has completed return paperwork for pt. MD cleared pt for d/c; confirmed d/c paperwork sent through hub. DNR, PTAR papers and scripts placed in drawer. RN Richard aware and will call report. PTAR scheduled for 1:30pm.    Final next level of care: [place] Barriers to Discharge: Barriers Resolved   Patient Goals and CMS Choice Patient states their goals for this hospitalization and ongoing recovery are:: return to [facility] when able CMS Medicare.gov Compare Post Acute Care list provided to::  (LTC resident at [facility]) Choice offered to / list presented to : [facility] POA / Guardian (pt cousin Langley Gauss)  Discharge Placement Existing PASRR number confirmed :  (LTC pt)          Patient chooses bed at: [facility] Patient to be transferred to facility by: [place] Name of family member notified: pt cousin Langley Gauss Patient and family notified of of transfer: 05/06/20  Discharge Plan and Services In-house Referral: Clinical Social Work Discharge Planning Services: CM Consult Post Acute Care Choice: Resumption of Svcs/PTA Provider, [facility]           Readmission Risk Interventions Readmission Risk Prevention Plan 04/28/2020  Transportation Screening Complete  PCP or Specialist Appt within 5-7 Days Not Complete  Not Complete comments SNF resident  Home Care Screening Not Complete  Home Care Screening Not Completed Comments SNF resident  Medication Review (RN CM) Referral to Pharmacy  Some recent data might be hidden

## 2020-05-06 NOTE — Discharge Summary (Signed)
Physician Discharge Summary  [name] QJJ:[id number] DOB: 1936-03-21 DOA: 04/26/2020  PCP: Reynold Bowen, MD  Admit date: 04/26/2020 Discharge date: 05/06/2020  Admitted From: nursing home Disposition: nursing home Recommendations for Outpatient Follow-up:  1. Follow up with PCP in 1-2 weeks 2. Please obtain BMP/CBC in one week 3. Please get a chest xray after 2 weeks   Home Health none Equipment/Devices:none  Discharge Condition:stable CODE STATUS:dnr Diet recommendation: regular dysphagia 3 diet and nectar thick liquids Brief/Interim Summary:84 year old female [place] home resident followed by Lonia Chimera for palliative care in the outpatient setting. The patient has a past medical history significant for prior GI bleed with coffee-ground emesis. She was found to have a massive hiatal hernia in EGD performed in 2015 with gastritis. She had an admission for a similar complaint in 2013 when she was found to have C Diff colitis. Past medical history is also significant for cerebral palsy with lower extremity contractures.   She was sent to the ED by her facility after aspiration of solid foods at the nursing home. At admission she was found to be hypoxic with Sao2 of 70% and CXR compatible with pneumonitis. She was admitted to the ICU and given BIPAP and zosyn.  History of cerebral palsy with lower extremity contractures. Patient aspirated solids at her nursing home came to the emergency room found to have an O2 sat 70% chest x-ray showed diffuse interstitial markings without prior filmfor pneumonitis-was transferred to the ICU and patient was started on BiPAP, Zosyn. She was transferred to the hospitalists service on 04/28/2020. She has been transitioned off of BIPAP to nasal cannula. She is currently saturating 92% on nasal cannula at3L.  Discharge Diagnoses:  Active Problems:   Aspiration pneumonia (HCC)   Choking episode   Hypoxia   #1 acute on chronic hypoxic  respiratory failure secondary to aspiration pneumonitis-patient was treated with IV Zosyn and BiPAP and high flow nasal cannula.  On the day of discharge she is on 3 L of oxygen maintaining her saturation at 92%.   Please encourage her to take deep breaths and use incentive spirometry frequently to avoid further hypoxia.   She was seen by speech therapy during this hospital stay.  Patient should be seated fully upright during feeding and staff needs to closely observe her during mealtimes to avoid any further choking episodes.  It is okay to maintain her saturations above 88%.   Continue Augmentin for 5 more days.  #2 chronic contractures/cerebral palsy on as needed Valium  #3 depression on BuSpar and Celexa and olanzapine  Estimated body mass index is 21.89 kg/m as calculated from the following:   Height as of 03/25/12: 4\' 10"  (1.473 m).   Weight as of this encounter: 47.5 kg.  Discharge Instructions   Allergies as of 05/06/2020   No Known Allergies     Medication List    TAKE these medications   albuterol (2.5 MG/3ML) 0.083% NEBU 3 mL, albuterol (5 MG/ML) 0.5% NEBU 0.5 mL Inhale 2.5 mg into the lungs every 6 (six) hours. Albuterol sul 2.5mg /57ml solution q6hr prn wheezing   amoxicillin-clavulanate 875-125 MG tablet Commonly known as: Augmentin Take 1 tablet by mouth 2 (two) times daily for 5 days.   atorvastatin 10 MG tablet Commonly known as: LIPITOR Take 10 mg by mouth daily.   bisacodyl 10 MG suppository Commonly known as: DULCOLAX Place 10 mg rectally every 3 (three) days.   busPIRone 15 MG tablet Commonly known as: BUSPAR Take 15 mg by  mouth 3 (three) times daily.   CHLORASEPTIC MT Use as directed in the mouth or throat. One spray to throat every 2 hours as needed for sore throad   cholecalciferol 1000 units tablet Commonly known as: VITAMIN D Take 2,000 Units by mouth daily.   citalopram 40 MG tablet Commonly known as: CELEXA Take 40 mg by mouth daily.    Cranberry 450 MG Caps Take 1 capsule by mouth 2 (two) times daily.   Depakene 250 MG/5ML Soln solution Generic drug: Valproate Sodium Take 250 mg by mouth in the morning and at bedtime.   diazepam 2 MG tablet Commonly known as: VALIUM Take 2 mg by mouth 2 (two) times daily. 1/2  tab (43mf) twice a day for muscle spasms   diphenhydrAMINE 25 mg capsule Commonly known as: BENADRYL Take 25 mg by mouth every 8 (eight) hours as needed for itching.   docusate sodium 100 MG capsule Commonly known as: COLACE Take 100 mg by mouth daily.   ferrous sulfate 220 (44 Fe) MG/5ML solution Take 220 mg by mouth at bedtime.   fluticasone 50 MCG/ACT nasal spray Commonly known as: FLONASE Place 1 spray into both nostrils daily.   guaifenesin 100 MG/5ML syrup Commonly known as: ROBITUSSIN Take 200 mg by mouth 4 (four) times daily as needed for cough.   HYDROcodone-acetaminophen 5-325 MG tablet Commonly known as: NORCO/VICODIN Take 1 tablet by mouth daily as needed for moderate pain. One tablet by mouth daily at lunch time as needed for breakthrough pain What changed: Another medication with the same name was removed. Continue taking this medication, and follow the directions you see here.   hydrocortisone cream 1 % Apply 1 application topically 2 (two) times daily as needed for itching. Apply to affected areas twice daily as needed for itching   ketoconazole 2 % cream Commonly known as: NIZORAL Apply 1 application topically See admin instructions. Shampoo hair twice weekly for treatment of dry scalp   ketotifen 0.025 % ophthalmic solution Commonly known as: ZADITOR Place 1 drop into both eyes 2 (two) times daily as needed.   loperamide 2 MG tablet Commonly known as: IMODIUM A-D Take 2 mg by mouth 4 (four) times daily as needed for diarrhea or loose stools. Give 4mg  po prn loose stools. May repeat 2mg  po tid for additional loose stools   OLANZapine 2.5 MG tablet Commonly known as:  ZYPREXA Take 1.25 mg by mouth at bedtime.   ondansetron 4 MG tablet Commonly known as: ZOFRAN Take 4 mg by mouth every 8 (eight) hours as needed for nausea or vomiting.   pantoprazole 40 MG tablet Commonly known as: Protonix Take 1 tablet (40 mg total) by mouth 2 (two) times daily.   Phenergan 25 MG/ML injection Generic drug: promethazine Inject 25 mg into the muscle every 6 (six) hours as needed for nausea or vomiting. For nausea and vomiting   polyethylene glycol 17 g packet Commonly known as: MIRALAX / GLYCOLAX Take 17 g by mouth at bedtime.   saccharomyces boulardii 250 MG capsule Commonly known as: FLORASTOR Take 250 mg by mouth 2 (two) times daily.   senna 8.6 MG tablet Commonly known as: SENOKOT Take 1 tablet by mouth daily as needed.   sucralfate 1 g tablet Commonly known as: CARAFATE Take 1 g by mouth 2 (two) times daily.   triamcinolone cream 0.1 % Commonly known as: KENALOG Apply 1 application topically 2 (two) times daily.       Follow-up Information  Reynold Bowen, MD Follow up.   Specialty: Endocrinology Contact information: [address] [place] [address] [phone]              No Known Allergies  Consultations:  PCCM   Procedures/Studies: DG Chest 2 View  Result Date: 05/01/2020 CLINICAL DATA:  Pneumonia EXAM: CHEST - 2 VIEW COMPARISON:  04/27/2020 chest radiograph. FINDINGS: Stable cardiomediastinal silhouette with mild cardiomegaly. No pneumothorax. Small left pleural effusion appears slightly increased. No right pleural effusion. Dense patchy bilateral lower lobe consolidation appears worsened. Hazy left parahilar lung opacity appears slightly increased. IMPRESSION: 1. Worsening dense patchy bilateral lower lobe consolidation and hazy left parahilar lung opacity, compatible with worsening multilobar pneumonia. 2. Small left pleural effusion appears slightly increased. Electronically Signed   By: Ilona Sorrel M.D.   On:  05/01/2020 08:15   CT Chest Wo Contrast  Result Date: 04/26/2020 CLINICAL DATA:  Respiratory failure Hypoxia, choking, concern for aspiration. No abdominal pain or chest pain EXAM: CT CHEST WITHOUT CONTRAST TECHNIQUE: Multidetector CT imaging of the chest was performed following the standard protocol without IV contrast. COMPARISON:  Chest radiograph earlier today. Included portions from abdominal CT 05/10/2014 FINDINGS: Cardiovascular: Aortic atherosclerosis and tortuosity. No aortic aneurysm. Heart is normal in size, displaced anteriorly by hiatal hernia. Trace pericardial fluid. Mediastinum/Nodes: Moderately large hiatal hernia. The stomach both above and below the diaphragm are dilated and fluid-filled. The thoracic esophagus is dilated and fluid-filled to the level of the thoracic inlet. The left thyroid gland is markedly enlarged measuring 6.1 x 4.3 x 6.5 cm and heterogeneous, extending substernal in the left. This causes mild rightward tracheal deviation. Right lobe of the thyroid gland is not well-defined. Scattered small mediastinal lymph nodes, motion artifact and lack contrast limits detailed assessment. Lungs/Pleura: Dense consolidation involving the dependent right and left lower lobes. Additional patchy opacities in the dependent right middle, right upper, and left upper lobe. There is no debris within the trachea or central bronchi. Possible trace pleural effusions/thickening. Occasional dependent parenchymal calcifications within the consolidated lung. Upper Abdomen: Distended stomach with air-fluid level, partially included. Musculoskeletal: The bones are diffusely under mineralized. Exaggerated thoracic kyphosis with diffuse degenerative change in the spine. No acute osseous abnormalities are seen. IMPRESSION: 1. Dense consolidation involving the dependent right and left lower lobes. Additional patchy opacities in the dependent right middle, right upper, and left upper lobes. Findings most  consistent with pneumonia, including aspiration. There is no concurrent debris within the trachea or central bronchi. 2. Moderately large hiatal hernia. Both the intrathoracic and subdiaphragmatic stomach are dilated and fluid-filled. The esophagus is dilated and fluid-filled to the level of thoracic inlet. This could predispose to aspiration. 3. Markedly enlarged left thyroid gland with substernal extension, causing mild rightward tracheal deviation. Recommend thyroid ultrasound, unless there are significant comorbidities or limited life expectancy, then no follow-up is recommended. (Ref: J Am Coll Radiol. 2015 Feb;12(2): 143-50). Aortic Atherosclerosis (ICD10-I70.0). Electronically Signed   By: Keith Rake M.D.   On: 04/26/2020 22:53   DG CHEST PORT 1 VIEW  Result Date: 05/03/2020 CLINICAL DATA:  Hypoxia, no chest complaints EXAM: PORTABLE CHEST 1 VIEW COMPARISON:  05/01/2020 FINDINGS: Bilateral lower lobe airspace disease. Patchy left upper lobe airspace disease. No pleural effusion or pneumothorax. Stable cardiomegaly. No acute osseous abnormality. IMPRESSION: Bilateral lower lobe and left upper lobe airspace disease concerning for stable multilobar pneumonia. Electronically Signed   By: Kathreen Devoid   On: 05/03/2020 13:05   DG Chest Port 1 [address]  Result Date: 04/27/2020 CLINICAL DATA:  Pneumonia EXAM: PORTABLE CHEST 1 VIEW COMPARISON:  04/26/2020 FINDINGS: Patient rotated to the right.  Aorta is tortuous. Question new area of airspace disease in the left midlung. Right lung clear.  No heart failure or effusion IMPRESSION: Possible new infiltrate in the left mid lung. This area is difficult to evaluate on the current study due to rotation. Recommend repeat chest x-ray preferably two-view chest x-ray if the patient is able to travel to the radiology department. Otherwise repeat portable x-ray. Electronically Signed   By: Franchot Gallo M.D.   On: 04/27/2020 15:59   DG Chest Portable 1 View  Result  Date: 04/26/2020 CLINICAL DATA:  Choking, short of breath with decreased O2 sats EXAM: PORTABLE CHEST 1 VIEW COMPARISON:  No recent priors are available for comparison FINDINGS: Image rotated to the RIGHT and semi erect positioning limits assessment. Accounting for this cardiomediastinal contours are unremarkable. There is added density at the RIGHT lung apex and patchy opacities as well as increased interstitial markings throughout both the RIGHT and LEFT chest. Fullness of the LEFT hilum may relate to degree of rotation. No sign of pleural effusion. Lucency beneath the LEFT hemidiaphragm tracks across the midline. There is extensive lucency beneath the LEFT hemidiaphragm. Osteopenia without acute bone process. IMPRESSION: 1. Question of patchy airspace opacities. There is some fullness of the LEFT hilum. Airways not well assessed on the current study. CT of the chest may be helpful for further assessment. 2. Interstitial and airspace markings throughout the RIGHT and LEFT chest. This may represent edema or atypical pneumonia. 3. Findings suspicious for free air in the upper abdomen marked gastric distension could potentially give this appearance though this crosses the midline on the current exam. This can be further assessed with CT. These results were called by telephone at the time of interpretation on 04/26/2020 at 9:16 pm to provider [facility] , who verbally acknowledged these results. Electronically Signed   By: Zetta Bills M.D.   On: 04/26/2020 21:16   DG Swallowing Func-Speech Pathology  Result Date: 05/04/2020 Study completed and documented by Greggory Keen, SLP student Supervised and reviewed by Herbie Baltimore MA CCC-SLP Objective Swallowing Evaluation: Type of Study: MBS-Modified Barium Swallow Study  Patient Details Name: [name] MRN: [id number] Date of Birth: 11-30-1935 Today's Date: 05/04/2020 Time: SLP Start Time (ACUTE ONLY): 1015 -SLP Stop Time (ACUTE ONLY): 1045 SLP Time  Calculation (min) (ACUTE ONLY): 30 min Past Medical History: Past Medical History: Diagnosis Date . Cerebral palsy ([place])  . Constipation  . Hyperlipidemia  Past Surgical History: Past Surgical History: Procedure Laterality Date . ESOPHAGOGASTRODUODENOSCOPY  01/28/2012  Procedure: ESOPHAGOGASTRODUODENOSCOPY (EGD);  Surgeon: Lear Ng, MD;  Location[facility] END[facility]COPY;  Service: Endoscopy;  Laterality: N/A; . ESOPHAGOGASTRODUODENOSCOPY N/A 05/10/2014  Procedure: ESOPHAGOGASTRODUODENOSCOPY (EGD);  Surgeon: Missy Sabins, MD;  Location: [facility] ENDOSCOPY;  Service: Endoscopy;  Laterality: N/A; . right hip surgery   . TONSILLECTOMY   HPI: 84 year old female from [facility] home admitted after aspiration of solid food. Per MD note resident followed by [facility] for palliative care in the outpatient setting. PMH: cerebral palsy, GI bleed with coffee-ground emesis, massive hiatal hernia in EGD performed in 2015 with gastritis. MBS 2018 flash penetration, mild oral dysphagia, Dys 3/thin recommended. MBS 2019 triggers at pyriforms with penetration before swallow, fully ejected penetrate, continue Dys 3/thin.  No data recorded Assessment / Plan / Recommendation CHL IP CLINICAL IMPRESSIONS 05/04/2020 Clinical Impression MBS revealed oropharyngeal dysphagia as characterized  by prolonged oral transit, pharyngeal residue, multiple swallows, and multiple instances of penetration/aspiration. She demonstrates a significantly prolonged oral transit across all POs. However, after a prolonged oral transit, her swallow was timely. She failed to initiate a swallow with a soft solid and regular solid, and POs had to be removed from oral cavity. Pt may have demonstrated one instance of penetration/aspiration on the first sip of nectar-thick liquid followed by a cough response. Trace flash penetration was also observed with nectar. Pt demonstrated multiple swallows with nectar. Pt may have demonstrated one instance of  penetration/aspiration on the first sip of thin liquid followed by a cough response. Significant pharyngeal residue was observed in the valleculae and lateral channels across POs. Recommend dys 1 (puree) diet and nectar thick liquids. Pt should be seated fully upright during all meals.  SLP Visit Diagnosis Dysphagia, oropharyngeal phase (R13.12) Attention and concentration deficit following -- Frontal lobe and executive function deficit following -- Impact on safety and function Moderate aspiration risk   CHL IP TREATMENT RECOMMENDATION 05/04/2020 Treatment Recommendations Therapy as outlined in treatment plan below   Prognosis 05/04/2020 Prognosis for Safe Diet Advancement Fair Barriers to Reach Goals Cognitive deficits Barriers/Prognosis Comment -- CHL IP DIET RECOMMENDATION 05/04/2020 SLP Diet Recommendations Dysphagia 1 (Puree) solids;Nectar thick liquid Liquid Administration via Straw;Cup Medication Administration Crushed with puree Compensations Slow rate;Small sips/bites Postural Changes Seated upright at 90 degrees;Remain semi-upright after after feeds/meals (Comment)   CHL IP OTHER RECOMMENDATIONS 05/04/2020 Recommended Consults -- Oral Care Recommendations Oral care QID Other Recommendations --   CHL IP FOLLOW UP RECOMMENDATIONS 05/04/2020 Follow up Recommendations Skilled Nursing facility   [facility] IP FREQUENCY AND DURATION 05/04/2020 Speech Therapy Frequency (ACUTE ONLY) min 2x/week Treatment Duration 2 weeks      CHL IP ORAL PHASE 05/04/2020 Oral Phase Impaired Oral - Pudding Teaspoon -- Oral - Pudding Cup -- Oral - Honey Teaspoon -- Oral - Honey Cup -- Oral - Nectar Teaspoon -- Oral - Nectar Cup -- Oral - Nectar Straw Delayed oral transit Oral - Thin Teaspoon -- Oral - Thin Cup -- Oral - Thin Straw Delayed oral transit Oral - Puree Delayed oral transit Oral - Mech Soft Delayed oral transit Oral - Regular Delayed oral transit Oral - Multi-Consistency -- Oral - Pill -- Oral Phase - Comment --  CHL IP PHARYNGEAL  PHASE 05/04/2020 Pharyngeal Phase Impaired Pharyngeal- Pudding Teaspoon -- Pharyngeal -- Pharyngeal- Pudding Cup -- Pharyngeal -- Pharyngeal- Honey Teaspoon -- Pharyngeal -- Pharyngeal- Honey Cup -- Pharyngeal -- Pharyngeal- Nectar Teaspoon -- Pharyngeal -- Pharyngeal- Nectar Cup -- Pharyngeal -- Pharyngeal- Nectar Straw Penetration/Aspiration during swallow;Trace aspiration;Pharyngeal residue - valleculae;Lateral channel residue Pharyngeal -- Pharyngeal- Thin Teaspoon -- Pharyngeal -- Pharyngeal- Thin Cup -- Pharyngeal -- Pharyngeal- Thin Straw Penetration/Aspiration during swallow;Pharyngeal residue - valleculae;Lateral channel residue Pharyngeal -- Pharyngeal- Puree Pharyngeal residue - valleculae;Lateral channel residue Pharyngeal -- Pharyngeal- Mechanical Soft (No Data) Pharyngeal -- Pharyngeal- Regular (No Data) Pharyngeal -- Pharyngeal- Multi-consistency -- Pharyngeal -- Pharyngeal- Pill -- Pharyngeal -- Pharyngeal Comment --  CHL IP CERVICAL ESOPHAGEAL PHASE 11/06/2016 Cervical Esophageal Phase WFL Pudding Teaspoon -- Pudding Cup -- Honey Teaspoon -- Honey Cup -- Nectar Teaspoon -- Nectar Cup -- Nectar Straw -- Thin Teaspoon -- Thin Cup -- Thin Straw -- Puree -- Mechanical Soft -- Regular -- Multi-consistency -- Pill -- Cervical Esophageal Comment -- DeBlois, Katherene Ponto 05/04/2020, 11:49 AM               (Echo, Carotid, EGD, Colonoscopy, ERCP)  Subjective:  Patient is resting in bed she does not appear to be in any distress her oxygen was out on her forehead when I saw her she denies shortness of breath she still has some cough she was more concerned about her leg twisting and wanted me to untwist her leg. She has chronic contractures of the bilateral lower extremities. Discharge Exam: Vitals:   05/05/20 2133 05/06/20 0502  BP: (!) 113/57 121/69  Pulse: 75 72  Resp: 17 20  Temp: 99.4 F (37.4 C) 98.2 F (36.8 C)  SpO2: 92% 91%   Vitals:   05/05/20 1513 05/05/20 2133 05/06/20 0500  05/06/20 0502  BP: (!) 95/58 (!) 113/57  121/69  Pulse: 79 75  72  Resp: 18 17  20   Temp: 98.4 F (36.9 C) 99.4 F (37.4 C)  98.2 F (36.8 C)  TempSrc: Oral Oral    SpO2: 93% 92%  91%  Weight:   47.5 kg     General: Pt is alert, awake, not in acute distress Cardiovascular: RRR, S1/S2 +, no rubs, no gallops Respiratory: CTA bilaterally, no wheezing, no rhonchi Abdominal: Soft, NT, ND, bowel sounds + Extremities chronic bilateral lower extremity contractures  The results of significant diagnostics from this hospitalization (including imaging, microbiology, ancillary and laboratory) are listed below for reference.     Microbiology: Recent Results (from the past 240 hour(s))  Respiratory Panel by RT PCR (Flu A&B, Covid) - Nasopharyngeal Swab     Status: None   Collection Time: 04/26/20  8:35 PM   Specimen: Nasopharyngeal Swab  Result Value Ref Range Status   SARS Coronavirus 2 by RT PCR NEGATIVE NEGATIVE Final    Comment: (NOTE) SARS-CoV-2 target nucleic acids are NOT DETECTED.  The SARS-CoV-2 RNA is generally detectable in upper respiratoy specimens during the acute phase of infection. The lowest concentration of SARS-CoV-2 viral copies this assay can detect is 131 copies/mL. A negative result does not preclude SARS-Cov-2 infection and should not be used as the sole basis for treatment or other patient management decisions. A negative result may occur with  improper specimen collection/handling, submission of specimen other than nasopharyngeal swab, presence of viral mutation(s) within the areas targeted by this assay, and inadequate number of viral copies (<131 copies/mL). A negative result must be combined with clinical observations, patient history, and epidemiological information. The expected result is Negative.  Fact Sheet for Patients:  PinkCheek.be  Fact Sheet for Healthcare Providers:   GravelBags.it  This test is no t yet approved or cleared by the [place] FDA and  has been authorized for detection and/or diagnosis of SARS-CoV-2 by FDA under an Emergency Use Authorization (EUA). This EUA will remain  in effect (meaning this test can be used) for the duration of the COVID-19 declaration under Section 564(b)(1) of the Act, 21 U.S.C. section 360bbb-3(b)(1), unless the authorization is terminated or revoked sooner.     Influenza A by PCR NEGATIVE NEGATIVE Final   Influenza B by PCR NEGATIVE NEGATIVE Final    Comment: (NOTE) The Xpert Xpress SARS-CoV-2/FLU/RSV assay is intended as an aid in  the diagnosis of influenza from Nasopharyngeal swab specimens and  should not be used as a sole basis for treatment. Nasal washings and  aspirates are unacceptable for Xpert Xpress SARS-CoV-2/FLU/RSV  testing.  Fact Sheet for Patients: PinkCheek.be  Fact Sheet for Healthcare Providers: GravelBags.it  This test is not yet approved or cleared by the [place] FDA and  has been authorized for detection and/or  diagnosis of SARS-CoV-2 by  FDA under an Emergency Use Authorization (EUA). This EUA will remain  in effect (meaning this test can be used) for the duration of the  Covid-19 declaration under Section 564(b)(1) of the Act, 21  U.S.C. section 360bbb-3(b)(1), unless the authorization is  terminated or revoked. Performed at [facility], [place] [address], [place], [place] [address]   MRSA PCR Screening     Status: None   Collection Time: 04/27/20  6:52 AM   Specimen: Nasal Mucosa; Nasopharyngeal  Result Value Ref Range Status   MRSA by PCR NEGATIVE NEGATIVE Final    Comment:        The GeneXpert MRSA Assay (FDA approved for NASAL specimens only), is one component of a comprehensive MRSA colonization surveillance program. It is not intended to diagnose MRSA infection nor  to guide or monitor treatment for MRSA infections. Performed at [facility], [place] [address] [place], [place] [address]      Labs: BNP (last 3 results) No results for input(s): BNP in the last 8760 hours. Basic Metabolic Panel: Recent Labs  Lab 04/30/20 0345 05/01/20 0302 05/02/20 0000 05/03/20 0206  NA 143 140 140 140  K 3.5 3.5 3.6 3.5  CL 111 108 106 106  CO2 25 22 25 28   GLUCOSE 108* 108* 116* 111*  BUN 12 9 12 12   CREATININE 0.47 0.45 0.72 0.56  CALCIUM 8.4* 8.6* 8.5* 8.4*   Liver Function Tests: Recent Labs  Lab 05/01/20 0302 05/02/20 0000 05/03/20 0206  AST 23 21 31   ALT 21 22 33  ALKPHOS 100 119 167*  BILITOT 0.9 0.7 0.8  PROT 5.3* 5.4* 5.5*  ALBUMIN 2.3* 2.2* 2.2*   No results for input(s): LIPASE, AMYLASE in the last 168 hours. No results for input(s): AMMONIA in the last 168 hours. CBC: Recent Labs  Lab 04/30/20 0729 05/01/20 0302 05/02/20 0000 05/03/20 0206  WBC 16.0* 14.1* 14.5* 11.4*  NEUTROABS 12.6* 10.0* 9.1* 5.9  HGB 10.4* 11.0* 10.8* 11.1*  HCT 33.0* 34.1* 34.3* 34.7*  MCV 94.0 91.4 91.5 92.0  PLT 172 176 203 239   Cardiac Enzymes: No results for input(s): CKTOTAL, CKMB, CKMBINDEX, TROPONINI in the last 168 hours. BNP: Invalid input(s): POCBNP CBG: No results for input(s): GLUCAP in the last 168 hours. D-Dimer No results for input(s): DDIMER in the last 72 hours. Hgb A1c No results for input(s): HGBA1C in the last 72 hours. Lipid Profile No results for input(s): CHOL, HDL, LDLCALC, TRIG, CHOLHDL, LDLDIRECT in the last 72 hours. Thyroid function studies No results for input(s): TSH, T4TOTAL, T3FREE, THYROIDAB in the last 72 hours.  Invalid input(s): FREET3 Anemia work up No results for input(s): VITAMINB12, FOLATE, FERRITIN, TIBC, IRON, RETICCTPCT in the last 72 hours. Urinalysis    Component Value Date/Time   COLORURINE YELLOW 03/25/2012 1814   APPEARANCEUR CLOUDY (A) 03/25/2012 1814   LABSPEC 1.011 03/25/2012 1814    PHURINE 7.0 03/25/2012 1814   GLUCOSEU NEGATIVE 03/25/2012 1814   HGBUR MODERATE (A) 03/25/2012 1814   BILIRUBINUR NEGATIVE 03/25/2012 1814   KETONESUR 40 (A) 03/25/2012 1814   PROTEINUR NEGATIVE 03/25/2012 1814   UROBILINOGEN 0.2 03/25/2012 1814   NITRITE NEGATIVE 03/25/2012 1814   LEUKOCYTESUR LARGE (A) 03/25/2012 1814   Sepsis Labs Invalid input(s): PROCALCITONIN,  WBC,  LACTICIDVEN Microbiology Recent Results (from the past 240 hour(s))  Respiratory Panel by RT PCR (Flu A&B, Covid) - Nasopharyngeal Swab     Status: None   Collection  Time: 04/26/20  8:35 PM   Specimen: Nasopharyngeal Swab  Result Value Ref Range Status   SARS Coronavirus 2 by RT PCR NEGATIVE NEGATIVE Final    Comment: (NOTE) SARS-CoV-2 target nucleic acids are NOT DETECTED.  The SARS-CoV-2 RNA is generally detectable in upper respiratoy specimens during the acute phase of infection. The lowest concentration of SARS-CoV-2 viral copies this assay can detect is 131 copies/mL. A negative result does not preclude SARS-Cov-2 infection and should not be used as the sole basis for treatment or other patient management decisions. A negative result may occur with  improper specimen collection/handling, submission of specimen other than nasopharyngeal swab, presence of viral mutation(s) within the areas targeted by this assay, and inadequate number of viral copies (<131 copies/mL). A negative result must be combined with clinical observations, patient history, and epidemiological information. The expected result is Negative.  Fact Sheet for Patients:  PinkCheek.be  Fact Sheet for Healthcare Providers:  GravelBags.it  This test is no t yet approved or cleared by the [place] FDA and  has been authorized for detection and/or diagnosis of SARS-CoV-2 by FDA under an Emergency Use Authorization (EUA). This EUA will remain  in effect (meaning this test can  be used) for the duration of the COVID-19 declaration under Section 564(b)(1) of the Act, 21 U.S.C. section 360bbb-3(b)(1), unless the authorization is terminated or revoked sooner.     Influenza A by PCR NEGATIVE NEGATIVE Final   Influenza B by PCR NEGATIVE NEGATIVE Final    Comment: (NOTE) The Xpert Xpress SARS-CoV-2/FLU/RSV assay is intended as an aid in  the diagnosis of influenza from Nasopharyngeal swab specimens and  should not be used as a sole basis for treatment. Nasal washings and  aspirates are unacceptable for Xpert Xpress SARS-CoV-2/FLU/RSV  testing.  Fact Sheet for Patients: PinkCheek.be  Fact Sheet for Healthcare Providers: GravelBags.it  This test is not yet approved or cleared by the [place] FDA and  has been authorized for detection and/or diagnosis of SARS-CoV-2 by  FDA under an Emergency Use Authorization (EUA). This EUA will remain  in effect (meaning this test can be used) for the duration of the  Covid-19 declaration under Section 564(b)(1) of the Act, 21  U.S.C. section 360bbb-3(b)(1), unless the authorization is  terminated or revoked. Performed at [facility], [place] [address] [place], [place] [address]   MRSA PCR Screening     Status: None   Collection Time: 04/27/20  6:52 AM   Specimen: Nasal Mucosa; Nasopharyngeal  Result Value Ref Range Status   MRSA by PCR NEGATIVE NEGATIVE Final    Comment:        The GeneXpert MRSA Assay (FDA approved for NASAL specimens only), is one component of a comprehensive MRSA colonization surveillance program. It is not intended to diagnose MRSA infection nor to guide or monitor treatment for MRSA infections. Performed at [facility], [place] [address] [place], [place] [address]      Time coordinating discharge: 39 minutes  SIGNED:   Georgette Shell, MD  [facility] 05/06/2020, 10:47 AM

## 2020-05-07 DIAGNOSIS — K449 Diaphragmatic hernia without obstruction or gangrene: Secondary | ICD-10-CM | POA: Diagnosis not present

## 2020-05-07 DIAGNOSIS — F419 Anxiety disorder, unspecified: Secondary | ICD-10-CM | POA: Diagnosis not present

## 2020-05-07 DIAGNOSIS — K219 Gastro-esophageal reflux disease without esophagitis: Secondary | ICD-10-CM | POA: Diagnosis not present

## 2020-05-07 DIAGNOSIS — J690 Pneumonitis due to inhalation of food and vomit: Secondary | ICD-10-CM | POA: Diagnosis not present

## 2020-05-07 DIAGNOSIS — D509 Iron deficiency anemia, unspecified: Secondary | ICD-10-CM | POA: Diagnosis not present

## 2020-05-07 DIAGNOSIS — G248 Other dystonia: Secondary | ICD-10-CM | POA: Diagnosis not present

## 2020-05-07 DIAGNOSIS — G809 Cerebral palsy, unspecified: Secondary | ICD-10-CM | POA: Diagnosis not present

## 2020-05-07 DIAGNOSIS — F329 Major depressive disorder, single episode, unspecified: Secondary | ICD-10-CM | POA: Diagnosis not present

## 2020-05-07 DIAGNOSIS — R0902 Hypoxemia: Secondary | ICD-10-CM | POA: Diagnosis not present

## 2020-05-07 DIAGNOSIS — F341 Dysthymic disorder: Secondary | ICD-10-CM | POA: Diagnosis not present

## 2020-05-09 DIAGNOSIS — F419 Anxiety disorder, unspecified: Secondary | ICD-10-CM | POA: Diagnosis not present

## 2020-05-09 DIAGNOSIS — F331 Major depressive disorder, recurrent, moderate: Secondary | ICD-10-CM | POA: Diagnosis not present

## 2020-05-10 ENCOUNTER — Other Ambulatory Visit: Payer: Self-pay | Admitting: *Deleted

## 2020-05-10 DIAGNOSIS — Z20828 Contact with and (suspected) exposure to other viral communicable diseases: Secondary | ICD-10-CM | POA: Diagnosis not present

## 2020-05-10 NOTE — Patient Outreach (Signed)
Member screened for potential [facility] Care Management needs as a benefit of [place] Medicare.  Per Patient [name] member resides in [facility].   Communication sent to  SW to collaborate about anticipated dc plans and potential [facility] Care Management needs.  Will continue to follow while member resides in SNF.   Marthenia Rolling, MSN-Ed, RN,BSN [place] Acute Care Coordinator [phone] [facility]) [phone]  (Toll free office)

## 2020-05-12 DIAGNOSIS — D509 Iron deficiency anemia, unspecified: Secondary | ICD-10-CM | POA: Diagnosis not present

## 2020-05-12 DIAGNOSIS — J690 Pneumonitis due to inhalation of food and vomit: Secondary | ICD-10-CM | POA: Diagnosis not present

## 2020-05-12 DIAGNOSIS — K5900 Constipation, unspecified: Secondary | ICD-10-CM | POA: Diagnosis not present

## 2020-05-12 DIAGNOSIS — G809 Cerebral palsy, unspecified: Secondary | ICD-10-CM | POA: Diagnosis not present

## 2020-05-12 DIAGNOSIS — G248 Other dystonia: Secondary | ICD-10-CM | POA: Diagnosis not present

## 2020-05-12 DIAGNOSIS — F341 Dysthymic disorder: Secondary | ICD-10-CM | POA: Diagnosis not present

## 2020-05-12 DIAGNOSIS — F419 Anxiety disorder, unspecified: Secondary | ICD-10-CM | POA: Diagnosis not present

## 2020-05-12 DIAGNOSIS — D72829 Elevated white blood cell count, unspecified: Secondary | ICD-10-CM | POA: Diagnosis not present

## 2020-05-12 DIAGNOSIS — Z66 Do not resuscitate: Secondary | ICD-10-CM | POA: Diagnosis not present

## 2020-05-12 DIAGNOSIS — F329 Major depressive disorder, single episode, unspecified: Secondary | ICD-10-CM | POA: Diagnosis not present

## 2020-05-13 DIAGNOSIS — Z20828 Contact with and (suspected) exposure to other viral communicable diseases: Secondary | ICD-10-CM | POA: Diagnosis not present

## 2020-05-13 DIAGNOSIS — F419 Anxiety disorder, unspecified: Secondary | ICD-10-CM | POA: Diagnosis not present

## 2020-05-13 DIAGNOSIS — F331 Major depressive disorder, recurrent, moderate: Secondary | ICD-10-CM | POA: Diagnosis not present

## 2020-05-17 DIAGNOSIS — Z20828 Contact with and (suspected) exposure to other viral communicable diseases: Secondary | ICD-10-CM | POA: Diagnosis not present

## 2020-05-18 DIAGNOSIS — F331 Major depressive disorder, recurrent, moderate: Secondary | ICD-10-CM | POA: Diagnosis not present

## 2020-05-18 DIAGNOSIS — F0391 Unspecified dementia with behavioral disturbance: Secondary | ICD-10-CM | POA: Diagnosis not present

## 2020-05-18 DIAGNOSIS — F419 Anxiety disorder, unspecified: Secondary | ICD-10-CM | POA: Diagnosis not present

## 2020-05-20 DIAGNOSIS — Z20828 Contact with and (suspected) exposure to other viral communicable diseases: Secondary | ICD-10-CM | POA: Diagnosis not present

## 2020-05-24 DIAGNOSIS — Z20828 Contact with and (suspected) exposure to other viral communicable diseases: Secondary | ICD-10-CM | POA: Diagnosis not present

## 2020-05-27 ENCOUNTER — Other Ambulatory Visit: Payer: Self-pay | Admitting: *Deleted

## 2020-05-27 DIAGNOSIS — Z20828 Contact with and (suspected) exposure to other viral communicable diseases: Secondary | ICD-10-CM | POA: Diagnosis not present

## 2020-05-27 NOTE — Patient Outreach (Signed)
[facility] Post- Acute Care Coordinator follow up. Member screened for potential [facility] Care Management needs as a benefit of [employer].  Verified in Patient Ping that member resides in [facility].   Communication sent to SNF SW to inquire about transition plans and potential [facility] Care Management needs.   Will continue to follow while member resides in SNF.    Aldric Wenzler, MSN-Ed, RN,BSN [facility] Post Acute Care Coordinator [phone] ( Business Mobile) 

## 2020-05-30 DIAGNOSIS — Z20828 Contact with and (suspected) exposure to other viral communicable diseases: Secondary | ICD-10-CM | POA: Diagnosis not present

## 2020-06-02 ENCOUNTER — Other Ambulatory Visit: Payer: Self-pay | Admitting: *Deleted

## 2020-06-02 NOTE — Patient Outreach (Signed)
[place] Coordinator follow up. Member screened for potential [facility] Care Management needs as a benefit of [place] Medicare.  Ms. Strehl is receiving skilled therapy at [facility]. Update received from [facility] SW indicates member will transition to LTC upon completion of therapy.  No identifiable [facility] Care Management needs at this time.    Marthenia Rolling, MSN-Ed, RN,BSN [place] Acute Care Coordinator [phone] [facility]) [phone]  (Toll free office)

## 2020-06-12 DIAGNOSIS — F331 Major depressive disorder, recurrent, moderate: Secondary | ICD-10-CM | POA: Diagnosis not present

## 2020-06-14 ENCOUNTER — Other Ambulatory Visit: Payer: Self-pay | Admitting: *Deleted

## 2020-06-14 NOTE — Patient Outreach (Signed)
[facility] Post- Acute Care Coordinator follow up. Member screened for potential [facility] Care Management needs as a benefit of [place] Medicare.  Verified in Patient [name] that member resides in [facility].   Communication sent to SNF SW to request transition plan update.   Will continue to follow while member resides in SNF.    Marthenia Rolling, MSN-Ed, RN,BSN [place] Acute Care Coordinator [phone] [facility])

## 2020-06-16 ENCOUNTER — Other Ambulatory Visit: Payer: Self-pay | Admitting: *Deleted

## 2020-06-16 DIAGNOSIS — F0391 Unspecified dementia with behavioral disturbance: Secondary | ICD-10-CM | POA: Diagnosis not present

## 2020-06-16 DIAGNOSIS — F0630 Mood disorder due to known physiological condition, unspecified: Secondary | ICD-10-CM | POA: Diagnosis not present

## 2020-06-16 DIAGNOSIS — F331 Major depressive disorder, recurrent, moderate: Secondary | ICD-10-CM | POA: Diagnosis not present

## 2020-06-16 DIAGNOSIS — F419 Anxiety disorder, unspecified: Secondary | ICD-10-CM | POA: Diagnosis not present

## 2020-06-16 NOTE — Patient Outreach (Signed)
[place] Coordinator follow up. Member screened for potential [facility] Care Management needs as a benefit of [place] Medicare.  [facility] SW reports member transitioned to LTC on 06/12/20.   No identifiable [facility] Care Management needs at this time.    Marthenia Rolling, MSN, RN,BSN [place] Acute Care Coordinator [phone] [facility]) [phone]  (Toll free office)

## 2020-06-17 DIAGNOSIS — S40219A Abrasion of unspecified shoulder, initial encounter: Secondary | ICD-10-CM | POA: Diagnosis not present

## 2020-06-17 DIAGNOSIS — G809 Cerebral palsy, unspecified: Secondary | ICD-10-CM | POA: Diagnosis not present

## 2020-06-28 ENCOUNTER — Non-Acute Institutional Stay: Payer: Medicare Other | Admitting: Nurse Practitioner

## 2020-06-28 ENCOUNTER — Other Ambulatory Visit: Payer: Self-pay

## 2020-06-28 DIAGNOSIS — Z515 Encounter for palliative care: Secondary | ICD-10-CM | POA: Diagnosis not present

## 2020-06-28 DIAGNOSIS — R1310 Dysphagia, unspecified: Secondary | ICD-10-CM

## 2020-06-28 NOTE — Progress Notes (Signed)
[facility] Consult Note Telephone: [phone]  Fax: [phone]  PATIENT NAME: [name] [address] [place] [address] [phone] (home)  DOB: 1935-08-23 MRN: [id number]  PRIMARY CARE PROVIDER:    Reynold Bowen, MD,  [place] [address] [phone]  REFERRING PROVIDER:   Reynold Bowen, [place],  [place] [address] [phone]  RESPONSIBLE PARTY:   Extended Emergency Contact Information Primary Emergency Contact: Toribio [place] Phone: [phone] Mobile Phone: [phone] Relation: Other Secondary Emergency Contact: [place] Phone: [phone] Mobile Phone: [phone] Relation: Other  I met face to face with patient in facility..   ASSESSMENT AND RECOMMENDATIONS:   Advance Care Planning: Goal of care: Goal of care is comfort and safety in the facility. Directives: Signed DNR and MOST form on file in the facility and on [facility] EMR. MOST form details includes comfort measures, determine antibiotics use at time of infection, No IVF, no feeding tube.   Symptom management/Cognition/functional decline: Patient awake, alert and coherent, able to make her needs known. She dependent on staff for all of her ADLs, able to feed self. Patient is wheelchair dependent, has abnormal posture often appearing like she would slide out of her wheelchair, has spastic episodes. Patient was hospitalized from 04/26/2020-05/06/2020 for choking episode during a meal, she was found to be  Hypoxic in the ED, admitted to the ICU and treated for aspiration pneumonia. Patient voiced no concerns on exam today, denied pain, denied acute cough or SOB. No report of fever or chills. Staff voiced no concerns for patient. Continue to monitor patient for evidence of worsening aspiration such as coughing with meals, changes with chewing/  swallowing, weight changes. Consider speech therapy and dietary consults as needed. Provided general support and encouragement, no other unmet needs identified.  Follow up Palliative Care Visit: Palliative care will continue to follow for goals of care clarification and symptom management. Return in about 4- 6 weeks or prn.  Family /Caregiver/Community Supports: Patient is a resident of [facility] and rehabilitation center. Her cousins Langley Gauss and Rosana Hoes are her HCPOA  I spent 48 minutes providing this consultation, time includes time spent with patient/family, chart review, provider coordination, and documentation. More than 50% of the time in this consultation was spent coordinating communication.   CHIEF COMPLAINT: Follow up palliative care visit  HISTORY OF PRESENT ILLNESS:  [name] is a 84 y.o. year old female with multiple medical problems including cerebral palsy with lower extremities contractures, spastic paraplegia, dysphagia on regular dysphagia 3 diet and nectar thick liquid . Palliative Care was asked to help address advance care planning and goals of care.   CODE STATUS: DNR  PPS: 30%  HOSPICE ELIGIBILITY/DIAGNOSIS: TBD  PHYSICAL EXAM / ROS:  Current and past weights: 100.4lbs down from 104.2lbs at last visit General: NAD, frail appearing, thin Cardiovascular: denied chest pain, no edema, S1S2 normal  Pulmonary: no cough, no increased SOB, room air Abdomen: appetite fair, denied constipation, incontinent of bowel GU: denies dysuria, incontinent of urine MSK:  Denied muscle or joint pain, wheelchair dependent Skin: no rashes noted on exposed skin Neurological: Muscle spasity,abnormal posturing  PAST MEDICAL HISTORY:  Past Medical History:  Diagnosis Date  . Cerebral palsy ([place])   . Constipation   . Hyperlipidemia     SOCIAL HX:  Social History   Tobacco Use  . Smoking status: Never Smoker  .  Smokeless tobacco: Never Used  Substance Use Topics  .  Alcohol use: No   FAMILY HX: No family history on file.  ALLERGIES: No Known Allergies   PERTINENT MEDICATIONS:  Outpatient Encounter Medications as of 06/28/2020  Medication Sig  . albuterol (2.5 MG/3ML) 0.083% NEBU 3 mL, albuterol (5 MG/ML) 0.5% NEBU 0.5 mL Inhale 2.5 mg into the lungs every 6 (six) hours. Albuterol sul 2.53m/3ml solution q6hr prn wheezing  . atorvastatin (LIPITOR) 10 MG tablet Take 10 mg by mouth daily.  . bisacodyl (DULCOLAX) 10 MG suppository Place 10 mg rectally every 3 (three) days.  . busPIRone (BUSPAR) 15 MG tablet Take 15 mg by mouth 3 (three) times daily.  . cholecalciferol (VITAMIN D) 1000 UNITS tablet Take 2,000 Units by mouth daily.  . citalopram (CELEXA) 40 MG tablet Take 40 mg by mouth daily.   . Cranberry 450 MG CAPS Take 1 capsule by mouth 2 (two) times daily.  . diazepam (VALIUM) 2 MG tablet Take 2 mg by mouth 2 (two) times daily. 1/2  tab (168m twice a day for muscle spasms  . diphenhydrAMINE (BENADRYL) 25 mg capsule Take 25 mg by mouth every 8 (eight) hours as needed for itching.  . docusate sodium (COLACE) 100 MG capsule Take 100 mg by mouth daily.   . ferrous sulfate 220 (44 Fe) MG/5ML solution Take 220 mg by mouth at bedtime.  . fluticasone (FLONASE) 50 MCG/ACT nasal spray Place 1 spray into both nostrils daily.  . [place] Kitchenuaifenesin (ROBITUSSIN) 100 MG/5ML syrup Take 200 mg by mouth 4 (four) times daily as needed for cough.  . [place] KitchenYDROcodone-acetaminophen (NORCO/VICODIN) 5-325 MG tablet Take 1 tablet by mouth daily as needed for moderate pain. One tablet by mouth daily at lunch time as needed for breakthrough pain  . hydrocortisone cream 1 % Apply 1 application topically 2 (two) times daily as needed for itching. Apply to affected areas twice daily as needed for itching  . ketoconazole (NIZORAL) 2 % cream Apply 1 application topically See admin instructions. Shampoo hair twice weekly for treatment of dry scalp   . ketotifen (ZADITOR) 0.025 % ophthalmic solution  Place 1 drop into both eyes 2 (two) times daily as needed.  . loperamide (IMODIUM A-D) 2 MG tablet Take 2 mg by mouth 4 (four) times daily as needed for diarrhea or loose stools. Give 59m61mo prn loose stools. May repeat 2mg86m tid for additional loose stools  . OLANZapine (ZYPREXA) 2.5 MG tablet Take 1.25 mg by mouth at bedtime.  . ondansetron (ZOFRAN) 4 MG tablet Take 4 mg by mouth every 8 (eight) hours as needed for nausea or vomiting.  . pantoprazole (PROTONIX) 40 MG tablet Take 1 tablet (40 mg total) by mouth 2 (two) times daily.  . Phenol (CHLORASEPTIC MT) Use as directed in the mouth or throat. One spray to throat every 2 hours as needed for sore throad  . polyethylene glycol (MIRALAX / GLYCOLAX) packet Take 17 g by mouth at bedtime.  . promethazine (PHENERGAN) 25 MG/ML injection Inject 25 mg into the muscle every 6 (six) hours as needed for nausea or vomiting. For nausea and vomiting  . saccharomyces boulardii (FLORASTOR) 250 MG capsule Take 250 mg by mouth 2 (two) times daily.  . seMarland Kitchenna (SENOKOT) 8.6 MG tablet Take 1 tablet by mouth daily as needed.   . sucralfate (CARAFATE) 1 g tablet Take 1 g by mouth 2 (two) times daily.  . trMarland Kitchenamcinolone cream (KENALOG) 0.1 % Apply 1 application topically 2 (  two) times daily.  . Valproate Sodium (DEPAKENE) 250 MG/5ML SOLN solution Take 250 mg by mouth in the morning and at bedtime.   No facility-administered encounter medications on file as of 06/28/2020.     Jari Favre, DNP, AGPCNP-BC

## 2020-07-05 DIAGNOSIS — E119 Type 2 diabetes mellitus without complications: Secondary | ICD-10-CM | POA: Diagnosis not present

## 2020-07-05 DIAGNOSIS — E559 Vitamin D deficiency, unspecified: Secondary | ICD-10-CM | POA: Diagnosis not present

## 2020-07-05 DIAGNOSIS — E785 Hyperlipidemia, unspecified: Secondary | ICD-10-CM | POA: Diagnosis not present

## 2020-07-05 DIAGNOSIS — D649 Anemia, unspecified: Secondary | ICD-10-CM | POA: Diagnosis not present

## 2020-07-06 DIAGNOSIS — Z20822 Contact with and (suspected) exposure to covid-19: Secondary | ICD-10-CM | POA: Diagnosis not present

## 2020-07-06 DIAGNOSIS — J189 Pneumonia, unspecified organism: Secondary | ICD-10-CM | POA: Diagnosis not present

## 2020-07-06 DIAGNOSIS — M25561 Pain in right knee: Secondary | ICD-10-CM | POA: Diagnosis not present

## 2020-07-06 DIAGNOSIS — R293 Abnormal posture: Secondary | ICD-10-CM | POA: Diagnosis not present

## 2020-07-06 DIAGNOSIS — R1312 Dysphagia, oropharyngeal phase: Secondary | ICD-10-CM | POA: Diagnosis not present

## 2020-07-06 DIAGNOSIS — R29818 Other symptoms and signs involving the nervous system: Secondary | ICD-10-CM | POA: Diagnosis not present

## 2020-07-06 DIAGNOSIS — R1311 Dysphagia, oral phase: Secondary | ICD-10-CM | POA: Diagnosis not present

## 2020-07-06 DIAGNOSIS — G809 Cerebral palsy, unspecified: Secondary | ICD-10-CM | POA: Diagnosis not present

## 2020-07-06 DIAGNOSIS — R278 Other lack of coordination: Secondary | ICD-10-CM | POA: Diagnosis not present

## 2020-07-07 DIAGNOSIS — F329 Major depressive disorder, single episode, unspecified: Secondary | ICD-10-CM | POA: Diagnosis not present

## 2020-07-07 DIAGNOSIS — K5900 Constipation, unspecified: Secondary | ICD-10-CM | POA: Diagnosis not present

## 2020-07-07 DIAGNOSIS — Z66 Do not resuscitate: Secondary | ICD-10-CM | POA: Diagnosis not present

## 2020-07-07 DIAGNOSIS — R29818 Other symptoms and signs involving the nervous system: Secondary | ICD-10-CM | POA: Diagnosis not present

## 2020-07-07 DIAGNOSIS — J189 Pneumonia, unspecified organism: Secondary | ICD-10-CM | POA: Diagnosis not present

## 2020-07-07 DIAGNOSIS — R1311 Dysphagia, oral phase: Secondary | ICD-10-CM | POA: Diagnosis not present

## 2020-07-07 DIAGNOSIS — D72829 Elevated white blood cell count, unspecified: Secondary | ICD-10-CM | POA: Diagnosis not present

## 2020-07-07 DIAGNOSIS — M25561 Pain in right knee: Secondary | ICD-10-CM | POA: Diagnosis not present

## 2020-07-07 DIAGNOSIS — R293 Abnormal posture: Secondary | ICD-10-CM | POA: Diagnosis not present

## 2020-07-07 DIAGNOSIS — G809 Cerebral palsy, unspecified: Secondary | ICD-10-CM | POA: Diagnosis not present

## 2020-07-08 DIAGNOSIS — R293 Abnormal posture: Secondary | ICD-10-CM | POA: Diagnosis not present

## 2020-07-08 DIAGNOSIS — R1311 Dysphagia, oral phase: Secondary | ICD-10-CM | POA: Diagnosis not present

## 2020-07-08 DIAGNOSIS — L98429 Non-pressure chronic ulcer of back with unspecified severity: Secondary | ICD-10-CM | POA: Diagnosis not present

## 2020-07-08 DIAGNOSIS — R29818 Other symptoms and signs involving the nervous system: Secondary | ICD-10-CM | POA: Diagnosis not present

## 2020-07-08 DIAGNOSIS — Z20822 Contact with and (suspected) exposure to covid-19: Secondary | ICD-10-CM | POA: Diagnosis not present

## 2020-07-08 DIAGNOSIS — J189 Pneumonia, unspecified organism: Secondary | ICD-10-CM | POA: Diagnosis not present

## 2020-07-08 DIAGNOSIS — G809 Cerebral palsy, unspecified: Secondary | ICD-10-CM | POA: Diagnosis not present

## 2020-07-08 DIAGNOSIS — M25561 Pain in right knee: Secondary | ICD-10-CM | POA: Diagnosis not present

## 2020-07-09 DIAGNOSIS — G809 Cerebral palsy, unspecified: Secondary | ICD-10-CM | POA: Diagnosis not present

## 2020-07-09 DIAGNOSIS — M25561 Pain in right knee: Secondary | ICD-10-CM | POA: Diagnosis not present

## 2020-07-09 DIAGNOSIS — J189 Pneumonia, unspecified organism: Secondary | ICD-10-CM | POA: Diagnosis not present

## 2020-07-09 DIAGNOSIS — R29818 Other symptoms and signs involving the nervous system: Secondary | ICD-10-CM | POA: Diagnosis not present

## 2020-07-09 DIAGNOSIS — R1311 Dysphagia, oral phase: Secondary | ICD-10-CM | POA: Diagnosis not present

## 2020-07-09 DIAGNOSIS — R293 Abnormal posture: Secondary | ICD-10-CM | POA: Diagnosis not present

## 2020-07-10 DIAGNOSIS — F331 Major depressive disorder, recurrent, moderate: Secondary | ICD-10-CM | POA: Diagnosis not present

## 2020-07-12 DIAGNOSIS — J189 Pneumonia, unspecified organism: Secondary | ICD-10-CM | POA: Diagnosis not present

## 2020-07-12 DIAGNOSIS — G809 Cerebral palsy, unspecified: Secondary | ICD-10-CM | POA: Diagnosis not present

## 2020-07-12 DIAGNOSIS — R293 Abnormal posture: Secondary | ICD-10-CM | POA: Diagnosis not present

## 2020-07-12 DIAGNOSIS — M25561 Pain in right knee: Secondary | ICD-10-CM | POA: Diagnosis not present

## 2020-07-12 DIAGNOSIS — R29818 Other symptoms and signs involving the nervous system: Secondary | ICD-10-CM | POA: Diagnosis not present

## 2020-07-12 DIAGNOSIS — R1311 Dysphagia, oral phase: Secondary | ICD-10-CM | POA: Diagnosis not present

## 2020-07-13 DIAGNOSIS — R29818 Other symptoms and signs involving the nervous system: Secondary | ICD-10-CM | POA: Diagnosis not present

## 2020-07-13 DIAGNOSIS — R1311 Dysphagia, oral phase: Secondary | ICD-10-CM | POA: Diagnosis not present

## 2020-07-13 DIAGNOSIS — M25561 Pain in right knee: Secondary | ICD-10-CM | POA: Diagnosis not present

## 2020-07-13 DIAGNOSIS — R293 Abnormal posture: Secondary | ICD-10-CM | POA: Diagnosis not present

## 2020-07-13 DIAGNOSIS — J189 Pneumonia, unspecified organism: Secondary | ICD-10-CM | POA: Diagnosis not present

## 2020-07-13 DIAGNOSIS — G809 Cerebral palsy, unspecified: Secondary | ICD-10-CM | POA: Diagnosis not present

## 2020-07-14 DIAGNOSIS — R293 Abnormal posture: Secondary | ICD-10-CM | POA: Diagnosis not present

## 2020-07-14 DIAGNOSIS — M25561 Pain in right knee: Secondary | ICD-10-CM | POA: Diagnosis not present

## 2020-07-14 DIAGNOSIS — G809 Cerebral palsy, unspecified: Secondary | ICD-10-CM | POA: Diagnosis not present

## 2020-07-14 DIAGNOSIS — R1311 Dysphagia, oral phase: Secondary | ICD-10-CM | POA: Diagnosis not present

## 2020-07-14 DIAGNOSIS — R29818 Other symptoms and signs involving the nervous system: Secondary | ICD-10-CM | POA: Diagnosis not present

## 2020-07-14 DIAGNOSIS — J189 Pneumonia, unspecified organism: Secondary | ICD-10-CM | POA: Diagnosis not present

## 2020-07-15 DIAGNOSIS — L98429 Non-pressure chronic ulcer of back with unspecified severity: Secondary | ICD-10-CM | POA: Diagnosis not present

## 2020-07-25 DIAGNOSIS — F331 Major depressive disorder, recurrent, moderate: Secondary | ICD-10-CM | POA: Diagnosis not present

## 2020-07-25 DIAGNOSIS — R059 Cough, unspecified: Secondary | ICD-10-CM | POA: Diagnosis not present

## 2020-09-19 ENCOUNTER — Emergency Department (HOSPITAL_COMMUNITY): Payer: Medicare Other

## 2020-09-19 ENCOUNTER — Other Ambulatory Visit: Payer: Self-pay

## 2020-09-19 ENCOUNTER — Inpatient Hospital Stay (HOSPITAL_COMMUNITY)
Admission: EM | Admit: 2020-09-19 | Discharge: 2020-09-25 | DRG: 871 | Disposition: A | Discharge status: Skilled Nursing Facility | Payer: Medicare Other | Attending: Internal Medicine | Admitting: Internal Medicine

## 2020-09-19 ENCOUNTER — Inpatient Hospital Stay (HOSPITAL_COMMUNITY): Payer: Medicare Other

## 2020-09-19 ENCOUNTER — Encounter (HOSPITAL_COMMUNITY): Payer: Self-pay | Admitting: Emergency Medicine

## 2020-09-19 DIAGNOSIS — R6521 Severe sepsis with septic shock: Secondary | ICD-10-CM

## 2020-09-19 DIAGNOSIS — G809 Cerebral palsy, unspecified: Secondary | ICD-10-CM | POA: Diagnosis present

## 2020-09-19 DIAGNOSIS — I480 Paroxysmal atrial fibrillation: Secondary | ICD-10-CM | POA: Diagnosis not present

## 2020-09-19 DIAGNOSIS — J690 Pneumonitis due to inhalation of food and vomit: Secondary | ICD-10-CM | POA: Diagnosis present

## 2020-09-19 DIAGNOSIS — Z79899 Other long term (current) drug therapy: Secondary | ICD-10-CM

## 2020-09-19 DIAGNOSIS — E876 Hypokalemia: Secondary | ICD-10-CM

## 2020-09-19 DIAGNOSIS — I10 Essential (primary) hypertension: Secondary | ICD-10-CM

## 2020-09-19 DIAGNOSIS — Z515 Encounter for palliative care: Secondary | ICD-10-CM | POA: Diagnosis not present

## 2020-09-19 DIAGNOSIS — M436 Torticollis: Secondary | ICD-10-CM | POA: Diagnosis present

## 2020-09-19 DIAGNOSIS — F0390 Unspecified dementia without behavioral disturbance: Secondary | ICD-10-CM | POA: Diagnosis present

## 2020-09-19 DIAGNOSIS — E041 Nontoxic single thyroid nodule: Secondary | ICD-10-CM | POA: Diagnosis present

## 2020-09-19 DIAGNOSIS — J9621 Acute and chronic respiratory failure with hypoxia: Secondary | ICD-10-CM | POA: Diagnosis present

## 2020-09-19 DIAGNOSIS — Z20822 Contact with and (suspected) exposure to covid-19: Secondary | ICD-10-CM | POA: Diagnosis present

## 2020-09-19 DIAGNOSIS — F32A Depression, unspecified: Secondary | ICD-10-CM | POA: Diagnosis present

## 2020-09-19 DIAGNOSIS — N390 Urinary tract infection, site not specified: Secondary | ICD-10-CM | POA: Diagnosis present

## 2020-09-19 DIAGNOSIS — K449 Diaphragmatic hernia without obstruction or gangrene: Secondary | ICD-10-CM | POA: Diagnosis present

## 2020-09-19 DIAGNOSIS — R1110 Vomiting, unspecified: Secondary | ICD-10-CM | POA: Diagnosis present

## 2020-09-19 DIAGNOSIS — E785 Hyperlipidemia, unspecified: Secondary | ICD-10-CM | POA: Diagnosis present

## 2020-09-19 DIAGNOSIS — A419 Sepsis, unspecified organism: Secondary | ICD-10-CM

## 2020-09-19 DIAGNOSIS — R000 Tachycardia, unspecified: Secondary | ICD-10-CM | POA: Diagnosis present

## 2020-09-19 DIAGNOSIS — K219 Gastro-esophageal reflux disease without esophagitis: Secondary | ICD-10-CM | POA: Diagnosis present

## 2020-09-19 DIAGNOSIS — R1310 Dysphagia, unspecified: Secondary | ICD-10-CM | POA: Diagnosis present

## 2020-09-19 DIAGNOSIS — E875 Hyperkalemia: Secondary | ICD-10-CM | POA: Diagnosis not present

## 2020-09-19 DIAGNOSIS — F419 Anxiety disorder, unspecified: Secondary | ICD-10-CM

## 2020-09-19 DIAGNOSIS — R112 Nausea with vomiting, unspecified: Secondary | ICD-10-CM

## 2020-09-19 DIAGNOSIS — Z66 Do not resuscitate: Secondary | ICD-10-CM | POA: Diagnosis present

## 2020-09-19 DIAGNOSIS — R4189 Other symptoms and signs involving cognitive functions and awareness: Secondary | ICD-10-CM | POA: Diagnosis present

## 2020-09-19 DIAGNOSIS — R0602 Shortness of breath: Secondary | ICD-10-CM

## 2020-09-19 DIAGNOSIS — Z7189 Other specified counseling: Secondary | ICD-10-CM | POA: Diagnosis not present

## 2020-09-19 HISTORY — DX: Sepsis, unspecified organism: A41.9

## 2020-09-19 HISTORY — DX: Other symptoms and signs involving cognitive functions and awareness: R41.89

## 2020-09-19 LAB — HEPATIC FUNCTION PANEL
ALT: 11 U/L (ref 0–44)
AST: 20 U/L (ref 15–41)
Albumin: 2.4 g/dL — ABNORMAL LOW (ref 3.5–5.0)
Alkaline Phosphatase: 52 U/L (ref 38–126)
Bilirubin, Direct: 0.1 mg/dL (ref 0.0–0.2)
Indirect Bilirubin: 0.5 mg/dL (ref 0.3–0.9)
Total Bilirubin: 0.6 mg/dL (ref 0.3–1.2)
Total Protein: 5.4 g/dL — ABNORMAL LOW (ref 6.5–8.1)

## 2020-09-19 LAB — URINALYSIS, ROUTINE W REFLEX MICROSCOPIC
Bilirubin Urine: NEGATIVE
Glucose, UA: NEGATIVE mg/dL
Ketones, ur: NEGATIVE mg/dL
Nitrite: NEGATIVE
Protein, ur: NEGATIVE mg/dL
Specific Gravity, Urine: 1.02 (ref 1.005–1.030)
pH: 5 (ref 5.0–8.0)

## 2020-09-19 LAB — CBC WITH DIFFERENTIAL/PLATELET
Abs Immature Granulocytes: 0.06 10*3/uL (ref 0.00–0.07)
Basophils Absolute: 0 10*3/uL (ref 0.0–0.1)
Basophils Relative: 0 %
Eosinophils Absolute: 0 10*3/uL (ref 0.0–0.5)
Eosinophils Relative: 0 %
HCT: 42.2 % (ref 36.0–46.0)
Hemoglobin: 13.5 g/dL (ref 12.0–15.0)
Immature Granulocytes: 0 %
Lymphocytes Relative: 4 %
Lymphs Abs: 0.5 10*3/uL — ABNORMAL LOW (ref 0.7–4.0)
MCH: 28.7 pg (ref 26.0–34.0)
MCHC: 32 g/dL (ref 30.0–36.0)
MCV: 89.8 fL (ref 80.0–100.0)
Monocytes Absolute: 0.6 10*3/uL (ref 0.1–1.0)
Monocytes Relative: 4 %
Neutro Abs: 13.5 10*3/uL — ABNORMAL HIGH (ref 1.7–7.7)
Neutrophils Relative %: 92 %
Platelets: 209 10*3/uL (ref 150–400)
RBC: 4.7 MIL/uL (ref 3.87–5.11)
RDW: 15.9 % — ABNORMAL HIGH (ref 11.5–15.5)
WBC: 14.7 10*3/uL — ABNORMAL HIGH (ref 4.0–10.5)
nRBC: 0 % (ref 0.0–0.2)

## 2020-09-19 LAB — COMPREHENSIVE METABOLIC PANEL
ALT: 13 U/L (ref 0–44)
AST: 21 U/L (ref 15–41)
Albumin: 2.3 g/dL — ABNORMAL LOW (ref 3.5–5.0)
Alkaline Phosphatase: 53 U/L (ref 38–126)
Anion gap: 6 (ref 5–15)
BUN: 12 mg/dL (ref 8–23)
CO2: 23 mmol/L (ref 22–32)
Calcium: 7.4 mg/dL — ABNORMAL LOW (ref 8.9–10.3)
Chloride: 112 mmol/L — ABNORMAL HIGH (ref 98–111)
Creatinine, Ser: 0.49 mg/dL (ref 0.44–1.00)
GFR, Estimated: >60 mL/min (ref >60)
Glucose, Bld: 90 mg/dL (ref 70–99)
Potassium: 3.8 mmol/L (ref 3.5–5.1)
Sodium: 141 mmol/L (ref 135–145)
Total Bilirubin: 0.6 mg/dL (ref 0.3–1.2)
Total Protein: 5 g/dL — ABNORMAL LOW (ref 6.5–8.1)

## 2020-09-19 LAB — BLOOD GAS, VENOUS
Acid-base deficit: 1.3 mmol/L (ref 0.0–2.0)
Bicarbonate: 22.3 mmol/L (ref 20.0–28.0)
O2 Saturation: 81 %
Patient temperature: 98.6
pCO2, Ven: 35.5 mmHg — ABNORMAL LOW (ref 44.0–60.0)
pH, Ven: 7.415 (ref 7.250–7.430)
pO2, Ven: 46.8 mmHg — ABNORMAL HIGH (ref 32.0–45.0)

## 2020-09-19 LAB — BASIC METABOLIC PANEL
Anion gap: 12 (ref 5–15)
BUN: 16 mg/dL (ref 8–23)
CO2: 22 mmol/L (ref 22–32)
Calcium: 8.3 mg/dL — ABNORMAL LOW (ref 8.9–10.3)
Chloride: 104 mmol/L (ref 98–111)
Creatinine, Ser: 0.56 mg/dL (ref 0.44–1.00)
GFR, Estimated: >60 mL/min (ref >60)
Glucose, Bld: 126 mg/dL — ABNORMAL HIGH (ref 70–99)
Potassium: 3 mmol/L — ABNORMAL LOW (ref 3.5–5.1)
Sodium: 138 mmol/L (ref 135–145)

## 2020-09-19 LAB — RESP PANEL BY RT-PCR (FLU A&B, COVID) ARPGX2
Influenza A by PCR: NEGATIVE
Influenza B by PCR: NEGATIVE
SARS Coronavirus 2 by RT PCR: NEGATIVE

## 2020-09-19 LAB — LACTIC ACID, PLASMA
Lactic Acid, Venous: 2.2 mmol/L (ref 0.5–1.9)
Lactic Acid, Venous: 3.2 mmol/L (ref 0.5–1.9)
Lactic Acid, Venous: 1.4 mmol/L (ref 0.5–1.9)

## 2020-09-19 LAB — MAGNESIUM: Magnesium: 1.6 mg/dL — ABNORMAL LOW (ref 1.7–2.4)

## 2020-09-19 LAB — LIPASE, BLOOD: Lipase: 21 U/L (ref 11–51)

## 2020-09-19 MED ORDER — PANTOPRAZOLE SODIUM 40 MG IV SOLR
40.0000 mg | Freq: Once | INTRAVENOUS | Status: AC
  Administered 2020-09-19: 40 mg via INTRAVENOUS
  Filled 2020-09-19: qty 40

## 2020-09-19 MED ORDER — IOHEXOL 300 MG/ML  SOLN
100.0000 mL | Freq: Once | INTRAMUSCULAR | Status: AC | PRN
  Administered 2020-09-19: 100 mL via INTRAVENOUS

## 2020-09-19 MED ORDER — ATORVASTATIN CALCIUM 10 MG PO TABS
10.0000 mg | ORAL_TABLET | Freq: Every day | ORAL | Status: DC
  Administered 2020-09-20 – 2020-09-25 (×6): 10 mg via ORAL
  Filled 2020-09-19 (×6): qty 1

## 2020-09-19 MED ORDER — ONDANSETRON HCL 4 MG PO TABS: 4.0000 mg | ORAL_TABLET | Freq: Four times a day (QID) | ORAL | Status: DC | PRN

## 2020-09-19 MED ORDER — SODIUM CHLORIDE 0.9 % IV SOLN: 250.0000 mL | INTRAVENOUS | Status: DC

## 2020-09-19 MED ORDER — SODIUM CHLORIDE 0.9 % IV SOLN
1.0000 g | Freq: Once | INTRAVENOUS | Status: AC
  Administered 2020-09-19: 1 g via INTRAVENOUS
  Filled 2020-09-19: qty 10

## 2020-09-19 MED ORDER — SODIUM CHLORIDE 0.9 % IV SOLN: INTRAVENOUS | Status: DC

## 2020-09-19 MED ORDER — SODIUM CHLORIDE 0.9 % IV BOLUS
1000.0000 mL | Freq: Once | INTRAVENOUS | Status: AC
  Administered 2020-09-19: 1000 mL via INTRAVENOUS

## 2020-09-19 MED ORDER — SUCRALFATE 1 G PO TABS
1.0000 g | ORAL_TABLET | Freq: Two times a day (BID) | ORAL | Status: DC
  Administered 2020-09-20 – 2020-09-25 (×11): 1 g via ORAL
  Filled 2020-09-19 (×11): qty 1

## 2020-09-19 MED ORDER — SODIUM CHLORIDE 0.9 % IV SOLN: Freq: Once | INTRAVENOUS | Status: AC

## 2020-09-19 MED ORDER — CHLORHEXIDINE GLUCONATE CLOTH 2 % EX PADS
6.0000 | MEDICATED_PAD | Freq: Every day | CUTANEOUS | Status: DC
  Administered 2020-09-19 – 2020-09-25 (×7): 6 via TOPICAL

## 2020-09-19 MED ORDER — DOCUSATE SODIUM 100 MG PO CAPS
100.0000 mg | ORAL_CAPSULE | Freq: Every day | ORAL | Status: DC
  Filled 2020-09-19: qty 1

## 2020-09-19 MED ORDER — ACETAMINOPHEN 650 MG RE SUPP: 650.0000 mg | Freq: Four times a day (QID) | RECTAL | Status: DC | PRN

## 2020-09-19 MED ORDER — SODIUM CHLORIDE 0.9 % IV SOLN
2.0000 g | INTRAVENOUS | Status: DC
  Administered 2020-09-20 – 2020-09-23 (×4): 2 g via INTRAVENOUS
  Filled 2020-09-19 (×2): qty 2
  Filled 2020-09-19 (×2): qty 20

## 2020-09-19 MED ORDER — METRONIDAZOLE IN NACL 5-0.79 MG/ML-% IV SOLN
500.0000 mg | Freq: Three times a day (TID) | INTRAVENOUS | Status: DC
  Administered 2020-09-19 – 2020-09-23 (×12): 500 mg via INTRAVENOUS
  Filled 2020-09-19 (×13): qty 100

## 2020-09-19 MED ORDER — POTASSIUM CHLORIDE 10 MEQ/100ML IV SOLN
10.0000 meq | INTRAVENOUS | Status: AC
  Administered 2020-09-19 (×4): 10 meq via INTRAVENOUS
  Filled 2020-09-19 (×4): qty 100

## 2020-09-19 MED ORDER — BUSPIRONE HCL 5 MG PO TABS
15.0000 mg | ORAL_TABLET | Freq: Three times a day (TID) | ORAL | Status: DC
  Administered 2020-09-19 – 2020-09-25 (×17): 15 mg via ORAL
  Filled 2020-09-19 (×18): qty 1

## 2020-09-19 MED ORDER — OLANZAPINE 2.5 MG PO TABS
1.2500 mg | ORAL_TABLET | Freq: Every day | ORAL | Status: DC
  Administered 2020-09-19 – 2020-09-24 (×6): 1.25 mg via ORAL
  Filled 2020-09-19 (×6): qty 0.5

## 2020-09-19 MED ORDER — ONDANSETRON HCL 4 MG/2ML IJ SOLN: 4.0000 mg | Freq: Four times a day (QID) | INTRAMUSCULAR | Status: DC | PRN

## 2020-09-19 MED ORDER — CITALOPRAM HYDROBROMIDE 20 MG PO TABS
40.0000 mg | ORAL_TABLET | Freq: Every day | ORAL | Status: DC
Start: 2020-09-20 — End: 2020-09-22
  Administered 2020-09-20 – 2020-09-21 (×2): 40 mg via ORAL
  Filled 2020-09-19 (×2): qty 2

## 2020-09-19 MED ORDER — SODIUM CHLORIDE 0.9 % IV SOLN
500.0000 mg | Freq: Once | INTRAVENOUS | Status: AC
  Administered 2020-09-19: 500 mg via INTRAVENOUS
  Filled 2020-09-19: qty 500

## 2020-09-19 MED ORDER — ACETAMINOPHEN 325 MG PO TABS: 650.0000 mg | ORAL_TABLET | Freq: Four times a day (QID) | ORAL | Status: DC | PRN

## 2020-09-19 MED ORDER — PANTOPRAZOLE SODIUM 40 MG PO TBEC
40.0000 mg | DELAYED_RELEASE_TABLET | Freq: Two times a day (BID) | ORAL | Status: DC
  Administered 2020-09-19: 40 mg via ORAL
  Filled 2020-09-19: qty 1

## 2020-09-19 MED ORDER — FLUTICASONE PROPIONATE 50 MCG/ACT NA SUSP
1.0000 | Freq: Every day | NASAL | Status: DC
  Administered 2020-09-19 – 2020-09-24 (×6): 1 via NASAL
  Filled 2020-09-19: qty 16

## 2020-09-19 MED ORDER — DIAZEPAM 2 MG PO TABS
1.0000 mg | ORAL_TABLET | Freq: Two times a day (BID) | ORAL | Status: DC
  Administered 2020-09-20 – 2020-09-25 (×11): 1 mg via ORAL
  Filled 2020-09-19 (×11): qty 1

## 2020-09-19 MED ORDER — LACTATED RINGERS IV BOLUS
30.0000 mL/kg | Freq: Once | INTRAVENOUS | Status: AC
  Administered 2020-09-19: 1425 mL via INTRAVENOUS

## 2020-09-19 MED ORDER — POLYETHYLENE GLYCOL 3350 17 G PO PACK
17.0000 g | PACK | Freq: Every day | ORAL | Status: DC
  Administered 2020-09-22 – 2020-09-24 (×3): 17 g via ORAL
  Filled 2020-09-19 (×3): qty 1

## 2020-09-19 MED ORDER — ONDANSETRON HCL 4 MG/2ML IJ SOLN
4.0000 mg | Freq: Once | INTRAMUSCULAR | Status: AC
  Administered 2020-09-19: 4 mg via INTRAVENOUS
  Filled 2020-09-19: qty 2

## 2020-09-19 MED ORDER — PANTOPRAZOLE SODIUM 40 MG PO PACK
40.0000 mg | PACK | Freq: Two times a day (BID) | ORAL | Status: DC
  Administered 2020-09-20 – 2020-09-21 (×3): 40 mg via ORAL
  Filled 2020-09-19 (×3): qty 20

## 2020-09-19 MED ORDER — ACETAMINOPHEN 650 MG RE SUPP
650.0000 mg | Freq: Once | RECTAL | Status: AC
  Administered 2020-09-19: 650 mg via RECTAL
  Filled 2020-09-19: qty 1

## 2020-09-19 MED ORDER — SODIUM CHLORIDE 0.9 % IV SOLN
500.0000 mg | INTRAVENOUS | Status: DC
  Administered 2020-09-20 – 2020-09-23 (×4): 500 mg via INTRAVENOUS
  Filled 2020-09-19 (×4): qty 500

## 2020-09-19 NOTE — ED Provider Notes (Addendum)
7:15 AM Care assumed from Dr. Tarry Kos.  Patient is currently awaiting evaluation and admission to the critical care team for sepsis requiring Levophed for blood pressure support.  Patient is received broad-spectrum antibiotics for pneumonia with possible UTI as well.  Anticipate admission to critical care service.  12:06 PM Critical care saw the patient and do not feel she needs ICU care at this time.  They say she is only on 2 L and although her lactic acid was rising, she is now not on pressors and is appearing well.  They agree with admission to medicine service for sepsis from pneumonia and possible UTI.  Medicine team will be called admission.  Clinical Impression: 1. Septic shock ([place])   2. Sepsis without acute organ dysfunction, due to unspecified organism ([place])   3. Nausea and vomiting in adult   4. Hypokalemia   5. COVID-19 virus not detected     Disposition: Admit  This note was prepared with assistance of Dragon voice recognition software. Occasional wrong-word or sound-a-like substitutions may have occurred due to the inherent limitations of voice recognition software.     Cassian Torelli, Gwenyth Allegra, MD 09/19/20 1207   2:06 PM During the process of admission, he was made aware that the patient was having nausea and vomiting and the admitting team expressed concerned about her abdomen.  We will get a CT abdomen pelvis as well as add on hepatic function to further evaluate the nausea vomiting and lactic acid troubles.  Anticipate admission after CT and other labs are completed.  CT scan was ordered and patient is being admitted to medicine service.   Ayanna Gheen, Gwenyth Allegra, MD 09/19/20 [phone]

## 2020-09-19 NOTE — Progress Notes (Signed)
Brief critical care evaluation  85 year old female patient resides at skilled nursing facility has a history of cerebral palsy, dependent for all activities of daily living with the exception of feeding herself.  Followed by hospice of [facility] she is a DO NOT RESUSCITATE.  Pulmonary called to evaluation for septic shock and intensive care admission.    Briefly Presented to the emergency room following a couple days of Vomiting of coffee-ground versus feculent emesis.  Actually started on antibiotics on 2/19 for possible urinary tract infection.  Presented to the emergency room for evaluation of vomiting and temperature 103.7.  Initially was hypotensive this responded to IV fluids antibiotics have been initiated.  On critical care arrival she is awake, follows commands, interactive chest x-ray shows mild bibasilar airspace disease which may reflect a mild aspiration pneumonia  Impression Aspiration pneumonia with hypoxic respiratory failure Septic shock responding to fluid  Full DNR, please refer to hospice of [facility] note  Not appropriate for ICU admission can go to medical floor critical care available if needed   Erick Colace ACNP-BC [facility] Pager # [phone] OR # [phone] if no answer

## 2020-09-19 NOTE — ED Notes (Signed)
O2 sats 87%, patient placed on 2 L O2 via Nasal Cannula

## 2020-09-19 NOTE — ED Triage Notes (Signed)
Patient arrives via EMS from [facility]. Facility called and stated patient had been vomiting feces since yesterday. Pt last vomitted around 0030. Patient was started on abx for UTI today. Abdomen is soft, non distended.  DNR.   EMS vitals:  T 100 BP 110/68 HR 94 SPO2 95% RA CBG 149

## 2020-09-19 NOTE — H&P (Signed)
History and Physical    [name] SJG:[id number] DOB: 07-Jun-1936 DOA: 09/19/2020  PCP: Reynold Bowen, MD  Patient coming from: [facility]  Chief Complaint: Vomiting feces  HPI: [name] is a 85 y.o. female with medical history significant of cerebral palsy, depression, GERD. Presenting with N/V. History is from chart review as patient is a poor historian. Apparently she has been vomiting feculent material since yesterday. Her SNF had concern that she had a UTI, so they started treating her for that with an unspecified abx. When her vomiting was observed, EMS was called for transport to the ED. There are no other noted aggravating or alleviating factors.  ED Course: She was found to be febrile (103+), tachycardic, and tachypneic. She was also hypotensive. She was given 3.5L in fluid boluses, but her BP remained low. She was going to start pressors, but her BP finally responded. Her CXR was concerning for PNA. Her UA was concerning for UTI. She was started on sepsis protocol. She was reviewed by PCCM. Ok'd to go to floor. TRH was called for admission.    Review of Systems:  Unable to obtain d/t mentation.  PMHx Past Medical History:  Diagnosis Date  . Cerebral palsy ([place])   . Cognitive impairment   . Constipation   . Hyperlipidemia     PSHx Past Surgical History:  Procedure Laterality Date  . ESOPHAGOGASTRODUODENOSCOPY  01/28/2012   Procedure: ESOPHAGOGASTRODUODENOSCOPY (EGD);  Surgeon: Lear Ng, MD;  Location: [facility] ENDOSCOPY;  Service: Endoscopy;  Laterality: N/A;  . ESOPHAGOGASTRODUODENOSCOPY N/A 05/10/2014   Procedure: ESOPHAGOGASTRODUODENOSCOPY (EGD);  Surgeon: Missy Sabins, MD;  Location: [facility] ENDOSCOPY;  Service: Endoscopy;  Laterality: N/A;  . right hip surgery    . TONSILLECTOMY      SocHx  reports that she has never smoked. She has never used smokeless tobacco. She reports that she does not drink alcohol and does not use drugs.  No Known  Allergies  FamHx History reviewed. No pertinent family history.  Prior to Admission medications   Medication Sig Start Date End Date Taking? Authorizing Provider  albuterol (2.5 MG/3ML) 0.083% NEBU 3 mL, albuterol (5 MG/ML) 0.5% NEBU 0.5 mL Inhale 2.5 mg into the lungs every 6 (six) hours. Albuterol sul 2.5mg /17ml solution q6hr prn wheezing   Yes [provider]  atorvastatin (LIPITOR) 10 MG tablet Take 10 mg by mouth daily.   Yes [provider]  bisacodyl (DULCOLAX) 10 MG suppository Place 10 mg rectally every 3 (three) days.   Yes [provider]  busPIRone (BUSPAR) 15 MG tablet Take 15 mg by mouth 3 (three) times daily.   Yes [provider]  cholecalciferol (VITAMIN D) 1000 UNITS tablet Take 2,000 Units by mouth daily.   Yes [provider]  citalopram (CELEXA) 40 MG tablet Take 40 mg by mouth daily.    Yes [provider]  Cranberry 450 MG CAPS Take 1 capsule by mouth 2 (two) times daily.   Yes [provider]  diazepam (VALIUM) 2 MG tablet Take 1 mg by mouth 2 (two) times daily.   Yes [provider]  diphenhydrAMINE (BENADRYL) 25 mg capsule Take 25 mg by mouth every 8 (eight) hours as needed for itching.   Yes [provider]  docusate sodium (COLACE) 100 MG capsule Take 100 mg by mouth daily.    Yes [provider]  ferrous sulfate 220 (44 Fe) MG/5ML solution Take 220 mg by mouth at bedtime.   Yes [provider]  fluticasone (FLONASE) 50 MCG/ACT nasal spray Place 1 spray into both nostrils daily.   Yes [provider]  guaifenesin (ROBITUSSIN) 100 MG/5ML syrup Take 200 mg by mouth 4 (four) times daily as needed for cough.   Yes [provider]  HYDROcodone-acetaminophen (NORCO/VICODIN) 5-325 MG tablet Take 1 tablet by mouth daily as needed for moderate pain. One tablet by mouth daily at lunch time as needed for breakthrough pain Patient taking differently: Take 1 tablet  by mouth 2 (two) times daily. For pain 05/06/20  Yes Georgette Shell, MD  hydrocortisone cream 1 % Apply 1 application topically 2 (two) times daily as needed for itching. Apply to affected areas twice daily as needed for itching   Yes [provider]  ketoconazole (NIZORAL) 2 % cream Apply 1 application topically See admin instructions. Shampoo hair twice weekly for treatment of dry scalp   Yes [provider]  ketotifen (ZADITOR) 0.025 % ophthalmic solution Place 1 drop into both eyes 2 (two) times daily as needed.   Yes [provider]  loperamide (IMODIUM A-D) 2 MG tablet Take 2 mg by mouth 4 (four) times daily as needed for diarrhea or loose stools. Give 4mg  po prn loose stools. May repeat 2mg  po tid for additional loose stools   Yes [provider]  OLANZapine (ZYPREXA) 2.5 MG tablet Take 1.25 mg by mouth at bedtime.   Yes [provider]  ondansetron (ZOFRAN) 4 MG tablet Take 4 mg by mouth every 8 (eight) hours as needed for nausea or vomiting.   Yes [provider]  pantoprazole (PROTONIX) 40 MG tablet Take 1 tablet (40 mg total) by mouth 2 (two) times daily. 05/12/14  Yes Burnard Bunting, MD  Phenol [facility] [place]) Use as directed in the mouth or throat. One spray to throat every 2 hours as needed for sore throad   Yes [provider]  polyethylene glycol (MIRALAX / GLYCOLAX) packet Take 17 g by mouth at bedtime.   Yes [provider]  promethazine (PHENERGAN) 25 MG/ML injection Inject 25 mg into the muscle every 6 (six) hours as needed for nausea or vomiting. For nausea and vomiting   Yes [provider]  saccharomyces boulardii (FLORASTOR) 250 MG capsule Take 250 mg by mouth 2 (two) times daily.   Yes [provider]  sucralfate (CARAFATE) 1 g tablet Take 1 g by mouth 2 (two) times daily.   Yes [provider]  triamcinolone cream (KENALOG) 0.1 % Apply 1 application topically 2 (two)  times daily.   Yes [provider]  senna (SENOKOT) 8.6 MG tablet Take 1 tablet by mouth daily as needed.     [provider]  Valproate Sodium (DEPAKENE) 250 MG/5ML SOLN solution Take 250 mg by mouth in the morning and at bedtime. Patient not taking: Reported on 09/19/2020    [provider]    Physical Exam: Vitals:   09/19/20 1015 09/19/20 1100 09/19/20 1147 09/19/20 1300  BP:  (!) 96/55 106/60 (!) 94/55  Pulse:  76 80 76  Resp: 18 20 20 17   Temp:      TempSrc:      SpO2:  96% 98% 94%    General: 85 y.o. ill-appearing female resting in bed Eyes: PERRL, normal sclera ENMT: Nares patent w/o discharge, orophaynx clear, dentition poor, ears w/o discharge/lesions/ulcers Neck: Supple, trachea midline Cardiovascular: RRR, +S1, S2, no m/g/r, equal pulses throughout Respiratory: decreased at bases, soft scattered rhonchi, normal WOB on  2L [place] GI: BS hypoactive, NDNT, soft, no masses noted, no organomegaly noted MSK: No e/c/c; BLE contracture Neuro: A&O x name only, unable to assess focality as she is only partially following direcitons Psyc: groggy  Labs on Admission: I have personally reviewed following labs and imaging studies  CBC: Recent Labs  Lab 09/19/20 0206  WBC 14.7*  NEUTROABS 13.5*  HGB 13.5  HCT 42.2  MCV 89.8  PLT 657   Basic Metabolic Panel: Recent Labs  Lab 09/19/20 0206  NA 138  K 3.0*  CL 104  CO2 22  GLUCOSE 126*  BUN 16  CREATININE 0.56  CALCIUM 8.3*   GFR: CrCl cannot be calculated (Unknown ideal weight.). Liver Function Tests: No results for input(s): AST, ALT, ALKPHOS, BILITOT, PROT, ALBUMIN in the last 168 hours. No results for input(s): LIPASE, AMYLASE in the last 168 hours. No results for input(s): AMMONIA in the last 168 hours. Coagulation Profile: No results for input(s): INR, PROTIME in the last 168 hours. Cardiac Enzymes: No results for input(s): CKTOTAL, CKMB, CKMBINDEX, TROPONINI in the last 168  hours. BNP (last 3 results) No results for input(s): PROBNP in the last 8760 hours. HbA1C: No results for input(s): HGBA1C in the last 72 hours. CBG: No results for input(s): GLUCAP in the last 168 hours. Lipid Profile: No results for input(s): CHOL, HDL, LDLCALC, TRIG, CHOLHDL, LDLDIRECT in the last 72 hours. Thyroid Function Tests: No results for input(s): TSH, T4TOTAL, FREET4, T3FREE, THYROIDAB in the last 72 hours. Anemia Panel: No results for input(s): VITAMINB12, FOLATE, FERRITIN, TIBC, IRON, RETICCTPCT in the last 72 hours. Urine analysis:    Component Value Date/Time   COLORURINE AMBER (A) 09/19/2020 0508   APPEARANCEUR TURBID (A) 09/19/2020 0508   LABSPEC 1.020 09/19/2020 0508   PHURINE 5.0 09/19/2020 0508   GLUCOSEU NEGATIVE 09/19/2020 0508   HGBUR MODERATE (A) 09/19/2020 0508   BILIRUBINUR NEGATIVE 09/19/2020 0508   KETONESUR NEGATIVE 09/19/2020 0508   PROTEINUR NEGATIVE 09/19/2020 0508   UROBILINOGEN 0.2 03/25/2012 1814   NITRITE NEGATIVE 09/19/2020 0508   LEUKOCYTESUR MODERATE (A) 09/19/2020 0508    Radiological Exams on Admission: DG Chest Port 1 View  Result Date: 09/19/2020 CLINICAL DATA:  Hypoxia after fluid resuscitation.  Vomiting. EXAM: PORTABLE CHEST 1 VIEW COMPARISON:  Earlier today FINDINGS: Cardiomegaly and vascular pedicle widening accentuated by rotation. Additional tracheal mass effect from a large left thyroid nodule extending into the mediastinum. Large hiatal hernia. Vascular congestion. Asymmetric indistinct density at the left base. No Kerley lines or effusion. No pneumothorax. IMPRESSION: 1. Vascular congestion. 2. Cannot exclude a left lower lobe infiltrate, certainty limited by patient rotation 3. Large hiatal hernia. Electronically Signed   By: Monte Fantasia M.D.   On: 09/19/2020 07:30   DG Chest Port 1 View  Result Date: 09/19/2020 CLINICAL DATA:  Vomiting feces since yesterday. EXAM: PORTABLE CHEST 1 VIEW COMPARISON:  May 03, 2020  FINDINGS: The study is limited secondary to patient positioning. Stable, chronic appearing increased lung markings are noted, bilaterally. Very mild right suprahilar and bilateral infrahilar atelectasis and/or infiltrate is noted. There is no evidence of a pleural effusion or pneumothorax. Stable enlargement of the cardiac silhouette is seen. Multilevel degenerative changes are noted throughout the thoracic spine. IMPRESSION: Mild right suprahilar and bilateral infrahilar atelectasis and/or infiltrate. Electronically Signed   By: Virgina Norfolk M.D.   On: 09/19/2020 02:52   Assessment/Plan Severe sepsis likely secondary to aspiration PNA and UTI     - admit to inpt,  SDU     - sepsis criteria: febrile, tachypnea, tachycardia, leukocytosis, elevated lactic acid, suspected source UTI/PNA; BP responsive after 73cc/kg fluid bolus     - follow cultures     - with reports of fecal vomiting; check CT ab/pelvis; in favor of adding flagyl to her abx regimen for now     - trend lactic acid  N/V     - reports of feculent vomiting     - ab exam is largely ok; but she is groggy and not able to contribute a whole lot to the interview     - check CT ab/pelvis     - NPO except sips/meds until CT review  Hypokalemia     - given 40 IV K+ in ED, rpt lab, check Mg2+; replace as necessary  Cerebral palsy Contractures     - continue antispasmodics as able  HLD     - statin  GERD     - protonix, carafate  DVT prophylaxis: SCDs  Code Status: DNR  Family Communication: None at bedside.  Consults called: None  Status is: Inpatient  Remains inpatient appropriate because:Inpatient level of care appropriate due to severity of illness   Dispo: The patient is from: SNF              Anticipated d/c is to: SNF              Anticipated d/c date is: 3 days              Patient currently is not medically stable to d/c.   Difficult to place patient No  Jonnie Finner DO [facility]  If 7PM-7AM,  please contact night-coverage [email or web address]  09/19/2020, 2:01 PM

## 2020-09-19 NOTE — ED Provider Notes (Signed)
[facility] DEPT Provider Note: Sharon Spurling, MD, FACEP  CSN: [id number] MRN: [id number] ARRIVAL: 09/19/20 at [place]: [id number]   CHIEF COMPLAINT  Vomiting  Level 5 caveat: Dementia HISTORY OF PRESENT ILLNESS  09/19/20 2:16 AM [name] is a 85 y.o. female with a history of cerebral palsy and cognitive impairment.  She is here with vomiting since yesterday.  Her living facility reports the emesis was possibly feculent or coffee-ground like.  EMS reports her abdomen has been soft and nontender without distention.  She was reportedly started on an antibiotic yesterday for suspected urinary tract infection.  On arrival her rectal temperature was noted to be 103.7.   Past Medical History:  Diagnosis Date  . Cerebral palsy ([place])   . Cognitive impairment   . Constipation   . Hyperlipidemia     Past Surgical History:  Procedure Laterality Date  . ESOPHAGOGASTRODUODENOSCOPY  01/28/2012   Procedure: ESOPHAGOGASTRODUODENOSCOPY (EGD);  Surgeon: Lear Ng, MD;  Location: [facility] ENDOSCOPY;  Service: Endoscopy;  Laterality: N/A;  . ESOPHAGOGASTRODUODENOSCOPY N/A 05/10/2014   Procedure: ESOPHAGOGASTRODUODENOSCOPY (EGD);  Surgeon: Missy Sabins, MD;  Location: [facility] ENDOSCOPY;  Service: Endoscopy;  Laterality: N/A;  . right hip surgery    . TONSILLECTOMY      History reviewed. No pertinent family history.  Social History   Tobacco Use  . Smoking status: Never Smoker  . Smokeless tobacco: Never Used  Substance Use Topics  . Alcohol use: No  . Drug use: No    Prior to Admission medications   Medication Sig Start Date End Date Taking? Authorizing Provider  albuterol (2.5 MG/3ML) 0.083% NEBU 3 mL, albuterol (5 MG/ML) 0.5% NEBU 0.5 mL Inhale 2.5 mg into the lungs every 6 (six) hours. Albuterol sul 2.5mg /41ml solution q6hr prn wheezing   Yes [provider]  atorvastatin (LIPITOR) 10 MG tablet Take 10 mg by mouth daily.   Yes [provider]  bisacodyl  (DULCOLAX) 10 MG suppository Place 10 mg rectally every 3 (three) days.   Yes [provider]  busPIRone (BUSPAR) 15 MG tablet Take 15 mg by mouth 3 (three) times daily.   Yes [provider]  cholecalciferol (VITAMIN D) 1000 UNITS tablet Take 2,000 Units by mouth daily.   Yes [provider]  citalopram (CELEXA) 40 MG tablet Take 40 mg by mouth daily.    Yes [provider]  Cranberry 450 MG CAPS Take 1 capsule by mouth 2 (two) times daily.   Yes [provider]  diazepam (VALIUM) 2 MG tablet Take 1 mg by mouth 2 (two) times daily.   Yes [provider]  diphenhydrAMINE (BENADRYL) 25 mg capsule Take 25 mg by mouth every 8 (eight) hours as needed for itching.   Yes [provider]  docusate sodium (COLACE) 100 MG capsule Take 100 mg by mouth daily.    Yes [provider]  ferrous sulfate 220 (44 Fe) MG/5ML solution Take 220 mg by mouth at bedtime.   Yes [provider]  fluticasone (FLONASE) 50 MCG/ACT nasal spray Place 1 spray into both nostrils daily.   Yes [provider]  guaifenesin (ROBITUSSIN) 100 MG/5ML syrup Take 200 mg by mouth 4 (four) times daily as needed for cough.   Yes [provider]  HYDROcodone-acetaminophen (NORCO/VICODIN) 5-325 MG tablet Take 1 tablet by mouth daily as needed for moderate pain. One tablet by mouth daily at lunch time as needed for breakthrough pain Patient taking differently: Take 1 tablet  by mouth 2 (two) times daily. For pain 05/06/20  Yes Sharon Shell, MD  hydrocortisone cream 1 % Apply 1 application topically 2 (two) times daily as needed for itching. Apply to affected areas twice daily as needed for itching   Yes [provider]  ketoconazole (NIZORAL) 2 % cream Apply 1 application topically See admin instructions. Shampoo hair twice weekly for treatment of dry scalp   Yes [provider]  ketotifen (ZADITOR) 0.025 % ophthalmic solution  Place 1 drop into both eyes 2 (two) times daily as needed.   Yes [provider]  loperamide (IMODIUM A-D) 2 MG tablet Take 2 mg by mouth 4 (four) times daily as needed for diarrhea or loose stools. Give 4mg  po prn loose stools. May repeat 2mg  po tid for additional loose stools   Yes [provider]  OLANZapine (ZYPREXA) 2.5 MG tablet Take 1.25 mg by mouth at bedtime.   Yes [provider]  ondansetron (ZOFRAN) 4 MG tablet Take 4 mg by mouth every 8 (eight) hours as needed for nausea or vomiting.   Yes [provider]  pantoprazole (PROTONIX) 40 MG tablet Take 1 tablet (40 mg total) by mouth 2 (two) times daily. 05/12/14  Yes Burnard Bunting, MD  Phenol [facility]) Use as directed in the mouth or throat. One spray to throat every 2 hours as needed for sore throad   Yes [provider]  polyethylene glycol (MIRALAX / GLYCOLAX) packet Take 17 g by mouth at bedtime.   Yes [provider]  promethazine (PHENERGAN) 25 MG/ML injection Inject 25 mg into the muscle every 6 (six) hours as needed for nausea or vomiting. For nausea and vomiting   Yes [provider]  saccharomyces boulardii (FLORASTOR) 250 MG capsule Take 250 mg by mouth 2 (two) times daily.   Yes [provider]  sucralfate (CARAFATE) 1 g tablet Take 1 g by mouth 2 (two) times daily.   Yes [provider]  triamcinolone cream (KENALOG) 0.1 % Apply 1 application topically 2 (two) times daily.   Yes [provider]  senna (SENOKOT) 8.6 MG tablet Take 1 tablet by mouth daily as needed.     [provider]  Valproate Sodium (DEPAKENE) 250 MG/5ML SOLN solution Take 250 mg by mouth in the morning and at bedtime. Patient not taking: Reported on 09/19/2020    [provider]    Allergies Patient has no known allergies.   REVIEW OF SYSTEMS  Negative except as noted here or in the History of Present Illness.   PHYSICAL EXAMINATION   Initial Vital Signs Blood pressure (!) 109/58, pulse 93, temperature (!) 103.6 F (39.8 C), temperature source Rectal, resp. rate 16, SpO2 93 %.  Examination General: Well-developed, well-nourished female in no acute distress; appearance consistent with age of record HENT: normocephalic; atraumatic Eyes: pupils equal, round and reactive to light; extraocular muscles grossly intact Neck: supple Heart: regular rate and rhythm Lungs: clear to auscultation bilaterally; rattly cough Abdomen: soft; nondistended; nontender; bowel sounds present Extremities: No deformity; contractures of lower extremities Neurologic: Awake, alert and oriented x1; mostly incomprehensible speech; minimal motor function of lower extremities  Skin: Warm and dry Psychiatric: Flat affect   RESULTS  Summary of this visit's results, reviewed and interpreted by myself:   EKG Interpretation  Date/Time:    Ventricular Rate:    PR Interval:    QRS Duration:   QT Interval:    QTC Calculation:   R Axis:  Text Interpretation:        Laboratory Studies: Results for orders placed or performed during the hospital encounter of 09/19/20 (from the past 24 hour(s))  Lactic acid, plasma     Status: Abnormal   Collection Time: 09/19/20  2:05 AM  Result Value Ref Range   Lactic Acid, Venous 2.2 (HH) 0.5 - 1.9 mmol/L  CBC with Differential/Platelet     Status: Abnormal   Collection Time: 09/19/20  2:06 AM  Result Value Ref Range   WBC 14.7 (H) 4.0 - 10.5 K/uL   RBC 4.70 3.87 - 5.11 MIL/uL   Hemoglobin 13.5 12.0 - 15.0 g/dL   HCT 42.2 36.0 - 46.0 %   MCV 89.8 80.0 - 100.0 fL   MCH 28.7 26.0 - 34.0 pg   MCHC 32.0 30.0 - 36.0 g/dL   RDW 15.9 (H) 11.5 - 15.5 %   Platelets 209 150 - 400 K/uL   nRBC 0.0 0.0 - 0.2 %   Neutrophils Relative % 92 %   Neutro Abs 13.5 (H) 1.7 - 7.7 K/uL   Lymphocytes Relative 4 %   Lymphs Abs 0.5 (L) 0.7 - 4.0 K/uL   Monocytes Relative 4 %   Monocytes Absolute 0.6 0.1 - 1.0 K/uL    Eosinophils Relative 0 %   Eosinophils Absolute 0.0 0.0 - 0.5 K/uL   Basophils Relative 0 %   Basophils Absolute 0.0 0.0 - 0.1 K/uL   Immature Granulocytes 0 %   Abs Immature Granulocytes 0.06 0.00 - 0.07 K/uL  Basic metabolic panel     Status: Abnormal   Collection Time: 09/19/20  2:06 AM  Result Value Ref Range   Sodium 138 135 - 145 mmol/L   Potassium 3.0 (L) 3.5 - 5.1 mmol/L   Chloride 104 98 - 111 mmol/L   CO2 22 22 - 32 mmol/L   Glucose, Bld 126 (H) 70 - 99 mg/dL   BUN 16 8 - 23 mg/dL   Creatinine, Ser 0.56 0.44 - 1.00 mg/dL   Calcium 8.3 (L) 8.9 - 10.3 mg/dL   GFR, Estimated >60 >60 mL/min   Anion gap 12 5 - 15  Blood culture (routine x 2)     Status: None (Preliminary result)   Collection Time: 09/19/20  3:24 AM   Specimen: BLOOD  Result Value Ref Range   Specimen Description      BLOOD SITE NOT SPECIFIED Performed at [facility], [address], [place], [place] [address]    Special Requests      BOTTLES DRAWN AEROBIC AND ANAEROBIC Blood Culture adequate volume Performed at [facility] [address] [place], [place] [address]    Culture PENDING    Report Status PENDING   Blood culture (routine x 2)     Status: None (Preliminary result)   Collection Time: 09/19/20  3:25 AM   Specimen: BLOOD  Result Value Ref Range   Specimen Description      BLOOD SITE NOT SPECIFIED Performed at [facility], [place] [address] [place], [place] [address]    Special Requests      BOTTLES DRAWN AEROBIC AND ANAEROBIC Blood Culture results may not be optimal due to an inadequate volume of blood received in culture bottles Performed at [facility], [place] [address], [place], [place] [address]    Culture PENDING    Report Status PENDING   Resp Panel by RT-PCR (Flu A&B, Covid) Nasopharyngeal Swab     Status:  None   Collection Time: 09/19/20  3:25 AM   Specimen: Nasopharyngeal Swab; Nasopharyngeal(NP) swabs in vial transport medium   Result Value Ref Range   SARS Coronavirus 2 by RT PCR NEGATIVE NEGATIVE   Influenza A by PCR NEGATIVE NEGATIVE   Influenza B by PCR NEGATIVE NEGATIVE  Blood gas, venous (at [facility] and [facility], not at [facility])     Status: Abnormal   Collection Time: 09/19/20  3:45 AM  Result Value Ref Range   pH, Ven 7.415 7.250 - 7.430   pCO2, Ven 35.5 (L) 44.0 - 60.0 mmHg   pO2, Ven 46.8 (H) 32.0 - 45.0 mmHg   Bicarbonate 22.3 20.0 - 28.0 mmol/L   Acid-base deficit 1.3 0.0 - 2.0 mmol/L   O2 Saturation 81.0 %   Patient temperature 98.6   Lactic acid, plasma     Status: Abnormal   Collection Time: 09/19/20  3:45 AM  Result Value Ref Range   Lactic Acid, Venous 3.2 (HH) 0.5 - 1.9 mmol/L   Imaging Studies: DG Chest Port 1 View  Result Date: 09/19/2020 CLINICAL DATA:  Vomiting feces since yesterday. EXAM: PORTABLE CHEST 1 VIEW COMPARISON:  May 03, 2020 FINDINGS: The study is limited secondary to patient positioning. Stable, chronic appearing increased lung markings are noted, bilaterally. Very mild right suprahilar and bilateral infrahilar atelectasis and/or infiltrate is noted. There is no evidence of a pleural effusion or pneumothorax. Stable enlargement of the cardiac silhouette is seen. Multilevel degenerative changes are noted throughout the thoracic spine. IMPRESSION: Mild right suprahilar and bilateral infrahilar atelectasis and/or infiltrate. Electronically Signed   By: Virgina Norfolk M.D.   On: 09/19/2020 02:52    ED COURSE and MDM  Nursing notes, initial and subsequent vitals signs, including pulse oximetry, reviewed and interpreted by myself.  Vitals:   09/19/20 0500 09/19/20 0530 09/19/20 0600 09/19/20 0630  BP: (!) 92/54 (!) 90/49 (!) 95/52 (!) 91/49  Pulse: 94 92 89 85  Resp: (!) 22 (!) 31 (!) 24 (!) 26  Temp:      TempSrc:      SpO2: 92% 92% 91% (!) 87%   Medications  potassium chloride 10 mEq in 100 mL IVPB (10 mEq Intravenous New Bag/Given 09/19/20 0649)  0.9 %  sodium chloride infusion  (has no administration in time range)  lactated ringers bolus 1,425 mL (0 mL/kg  47.5 kg (Order-Specific) Intravenous Stopped 09/19/20 0400)  ondansetron (ZOFRAN) injection 4 mg (4 mg Intravenous Given 09/19/20 0238)  pantoprazole (PROTONIX) injection 40 mg (40 mg Intravenous Given 09/19/20 0238)  acetaminophen (TYLENOL) suppository 650 mg (650 mg Rectal Given 09/19/20 0315)  cefTRIAXone (ROCEPHIN) 1 g in sodium chloride 0.9 % 100 mL IVPB (0 g Intravenous Stopped 09/19/20 0423)  azithromycin (ZITHROMAX) 500 mg in sodium chloride 0.9 % 250 mL IVPB (0 mg Intravenous Stopped 09/19/20 0535)  sodium chloride 0.9 % bolus 1,000 mL (0 mLs Intravenous Stopped 09/19/20 0601)  0.9 %  sodium chloride infusion ( Intravenous Restarted 09/19/20 0653)  sodium chloride 0.9 % bolus 1,000 mL (0 mLs Intravenous Stopped 09/19/20 0656)   2:18 AM Myself and nursing staff were unable to obtain in and out urine catheterization due to contractures of legs preventing access to the vulvovaginal region.  She was placed on a pure wick for collection of urine.  Blood cultures and lactate ordered.  LR bolus initiated.  Patient does not meet sepsis criteria at this time.   2:52 AM Potassium repletion initiated.   3:18 AM Rocephin and  Zithromax IV ordered for possible pneumonia which should also treat urinary tract infection.   4:34 AM Soft BP improved after additional IV fluid bolus.  Lactate has increased.  Will have patient admitted for sepsis, suspected of urinary origin.  5:38 AM Discussed with [name].  She is concerned that the patient may need critical care admission.  She would like [place] to give an additional liter of fluid in addition to the 2.5 L already given.  If the patient's blood pressure improves she will have [place] contact the morning hospitalist team otherwise she recommends contacting critical care.  6:32 AM Blood pressure 91/49 after a total of 3.5 L of IV fluid.  Oxygen saturation 97 and 89% on room air.  I  suspect she may have early pulmonary edema or more prominent pneumonia after fluid resuscitation.  Will start Levophed peripherally and consult pulmonary critical care.  Urinalysis still pending but urine cloudy on visual inspection.   6:37 AM PCCM (spoke to [name]) to see in ED.    PROCEDURES  Procedures CRITICAL CARE Performed by: [name] Total critical care time: 45 minutes Critical care time was exclusive of separately billable procedures and treating other patients. Critical care was necessary to treat or prevent imminent or life-threatening deterioration. Critical care was time spent personally by me on the following activities: development of treatment plan with patient and/or surrogate as well as nursing, discussions with consultants, evaluation of patient's response to treatment, examination of patient, obtaining history from patient or surrogate, ordering and performing treatments and interventions, ordering and review of laboratory studies, ordering and review of radiographic studies, pulse oximetry and re-evaluation of patient's condition.   ED DIAGNOSES     ICD-10-CM   1. Septic shock (HCC)  A41.9    R65.21   2. Sepsis without acute organ dysfunction, due to unspecified organism ([place])  A41.9   3. Nausea and vomiting in adult  R11.2   4. Hypokalemia  E87.6   5. COVID-19 virus not detected  Z20.822        Sharon Rosser, MD 09/19/20 [phone]

## 2020-09-19 NOTE — ED Notes (Addendum)
Critical Result received -- 0250   Lactic acid 2.2   Dr. Florina Ou notified. Awaiting orders.

## 2020-09-20 DIAGNOSIS — Z66 Do not resuscitate: Secondary | ICD-10-CM

## 2020-09-20 DIAGNOSIS — R6521 Severe sepsis with septic shock: Secondary | ICD-10-CM

## 2020-09-20 DIAGNOSIS — Z7189 Other specified counseling: Secondary | ICD-10-CM

## 2020-09-20 DIAGNOSIS — R112 Nausea with vomiting, unspecified: Secondary | ICD-10-CM

## 2020-09-20 DIAGNOSIS — E876 Hypokalemia: Secondary | ICD-10-CM

## 2020-09-20 DIAGNOSIS — Z515 Encounter for palliative care: Secondary | ICD-10-CM

## 2020-09-20 LAB — CBC
HCT: 35.7 % — ABNORMAL LOW (ref 36.0–46.0)
Hemoglobin: 10.9 g/dL — ABNORMAL LOW (ref 12.0–15.0)
MCH: 28.5 pg (ref 26.0–34.0)
MCHC: 30.5 g/dL (ref 30.0–36.0)
MCV: 93.5 fL (ref 80.0–100.0)
Platelets: 152 10*3/uL (ref 150–400)
RBC: 3.82 MIL/uL — ABNORMAL LOW (ref 3.87–5.11)
RDW: 16.2 % — ABNORMAL HIGH (ref 11.5–15.5)
WBC: 15.4 10*3/uL — ABNORMAL HIGH (ref 4.0–10.5)
nRBC: 0 % (ref 0.0–0.2)

## 2020-09-20 LAB — COMPREHENSIVE METABOLIC PANEL
ALT: 13 U/L (ref 0–44)
AST: 19 U/L (ref 15–41)
Albumin: 2.5 g/dL — ABNORMAL LOW (ref 3.5–5.0)
Alkaline Phosphatase: 56 U/L (ref 38–126)
Anion gap: 9 (ref 5–15)
BUN: 11 mg/dL (ref 8–23)
CO2: 21 mmol/L — ABNORMAL LOW (ref 22–32)
Calcium: 7.8 mg/dL — ABNORMAL LOW (ref 8.9–10.3)
Chloride: 111 mmol/L (ref 98–111)
Creatinine, Ser: 0.45 mg/dL (ref 0.44–1.00)
GFR, Estimated: >60 mL/min (ref >60)
Glucose, Bld: 87 mg/dL (ref 70–99)
Potassium: 3.4 mmol/L — ABNORMAL LOW (ref 3.5–5.1)
Sodium: 141 mmol/L (ref 135–145)
Total Bilirubin: 0.7 mg/dL (ref 0.3–1.2)
Total Protein: 5.4 g/dL — ABNORMAL LOW (ref 6.5–8.1)

## 2020-09-20 LAB — PROTIME-INR
INR: 1.3 — ABNORMAL HIGH (ref 0.8–1.2)
Prothrombin Time: 15.8 seconds — ABNORMAL HIGH (ref 11.4–15.2)

## 2020-09-20 LAB — URINE CULTURE

## 2020-09-20 LAB — PROCALCITONIN: Procalcitonin: 1.47 ng/mL

## 2020-09-20 LAB — CORTISOL-AM, BLOOD: Cortisol - AM: 18.5 ug/dL (ref 6.7–22.6)

## 2020-09-20 MED ORDER — LACTATED RINGERS IV SOLN: INTRAVENOUS | Status: DC

## 2020-09-20 MED ORDER — CHLORHEXIDINE GLUCONATE 0.12 % MT SOLN
15.0000 mL | Freq: Two times a day (BID) | OROMUCOSAL | Status: DC
  Administered 2020-09-20 – 2020-09-25 (×10): 15 mL via OROMUCOSAL
  Filled 2020-09-20 (×9): qty 15

## 2020-09-20 MED ORDER — DOCUSATE SODIUM 50 MG/5ML PO LIQD
100.0000 mg | Freq: Every day | ORAL | Status: DC
Start: 2020-09-20 — End: 2020-09-25
  Administered 2020-09-20 – 2020-09-25 (×3): 100 mg via ORAL
  Filled 2020-09-20 (×4): qty 10

## 2020-09-20 MED ORDER — ORAL CARE MOUTH RINSE
15.0000 mL | Freq: Two times a day (BID) | OROMUCOSAL | Status: DC
  Administered 2020-09-21 – 2020-09-24 (×5): 15 mL via OROMUCOSAL

## 2020-09-20 NOTE — Consult Note (Signed)
Consultation Note Date: 09/20/2020   Patient Name: [name]  DOB: 1936-03-14  MRN: [id number]  Age / Sex: 85 y.o., female   PCP: Sharon Bowen, MD Referring Physician: Deatra James, MD   REASON FOR CONSULTATION:Establishing goals of care  Palliative Care consult requested for goals of care discussion in this 85 y.o. female with multiple medical problems including hyperlipidemia, depression, cerebral palsy, hiatal hernia, and cognitive impairment. Patient presented to the ER from [facility] facility with complaints of vomiting feculent material. Per notation facility had been treating patient for UTI. During work-up patient was afebrile (103), hypotensive, and tachyneic. UA showed UTI and chest x-ray concerning for pneumonia. Patient started on IV antibiotics per sepsis protocol.   Clinical Assessment and Goals of Care: I have reviewed medical records including lab results, imaging, Epic notes, and MAR. I spoke with patient's cousin, [name] [facility]) to discuss diagnosis prognosis, GOC, EOL wishes, disposition and options.  [name] reports patient is actively being followed by outpatient Palliative for several years at [facility]. I re-introduced Palliative Medicine as specialized medical care for people living with serious illness. It focuses on providing relief from the symptoms and stress of a serious illness. The goal is to improve quality of life for both the patient and the family.  We discussed a brief life review of the patient, along with her functional and nutritional status. [name] shares patient has been disabled her entire life due to CP. Her parents care for her until they passed away at the age of 53. Patient has since been in a facility with most recent stay at [facility] for the past 8 years. Patient at one time was able to walk with crutches or a walker in addition to leg braces. She has however been bed/chair bound for the past several years.   [name] states  patient is able to assist with soe ADLs such as brushing her teeth and feeding herself. Her roommate and best friend of the past 8 years recently past away 2 weeks ago and family feels Ms. [name]'s condition has somewhat declined since. [name] reports she most recently visited patient on Thursday and at that time she seemed to be doing somewhat better. She was able to eat the entire kids frosty that she had brought to patient.   We discussed Her current illness and what it means in the larger context of Her on-going co-morbidities. With specific discussions regarding patient's high risk for aspiration, and overall functional and nutritional state. Natural disease trajectory and expectations at EOL were discussed.  [name] verbalized understanding of patient's current illness and co-morbidities. She reports patient has experienced many challenges over the years in regards to her health.   I attempted to elicit values and goals of care important to the patient.    We discussed at length patient's aspiration risk which will continue to place patient at high risk for recurrent pneumonia. [name] expresses understanding and acknowledges patient's ongoing aspiration challenges in addition to her hiatal hernia. [name] reports SLP works closely with patient at the facility offering recommendations to attempt to reduce the risk as much as possible.   Advanced directives, concepts specific to code status, artifical feeding and hydration, and rehospitalization were considered and discussed. Patient does have a documented advanced directive. [name] and her husband [name] are patient's designated Health and safety inspector (HCPOA).   [name] confirms wishes for DNR/DNI, no artificial feeding tubes/PEGs, and no aggressive interventions such as dialysis if ever needed.   She is  clear in expressing goals of care to continue to treat the treatable while hospitalized with a goal of returning to [facility] facility with  Palliative care continued support.   We discussed hospice and palliative. Family understands they may transition care to focus more on comfort and hospice at anytime by expressing wishes to medical team.   Questions and concerns were addressed. The family was encouraged to call with questions or concerns.  PMT will continue to support holistically.   SOCIAL HISTORY:     reports that she has never smoked. She has never used smokeless tobacco. She reports that she does not drink alcohol and does not use drugs.  CODE STATUS: DNR  ADVANCE DIRECTIVES: Primary Decision Maker: [name] and [name] (Cousins/HCPOA)    SYMPTOM MANAGEMENT: per attending   Palliative Prophylaxis:   Aspiration, Bowel Regimen, Delirium Protocol, Eye Care, Frequent Pain Assessment, Oral Care and Turn Reposition  PSYCHO-SOCIAL/SPIRITUAL:  Support System: Family  Desire for further Chaplaincy support:No Patient's Doristine Bosworth has been involved  Additional Recommendations (Limitations, Scope, Preferences):  No Artificial Feeding, No Surgical Procedures and Treat the treatable, no aggressive interventions, DNR/Dni  Education on hospice/palliative    PAST MEDICAL HISTORY: Past Medical History:  Diagnosis Date  . Cerebral palsy ([place])   . Cognitive impairment   . Constipation   . Hyperlipidemia     ALLERGIES:  has No Known Allergies.   MEDICATIONS:  Current Facility-Administered Medications  Medication Dose Route Frequency Provider Last Rate Last Admin  . 0.9 %  sodium chloride infusion  250 mL Intravenous Continuous Molpus, John, MD      . acetaminophen (TYLENOL) tablet 650 mg  650 mg Oral Q6H PRN Marylyn Ishihara, Tyrone A, DO       Or  . acetaminophen (TYLENOL) suppository 650 mg  650 mg Rectal Q6H PRN Marylyn Ishihara, Tyrone A, DO      . atorvastatin (LIPITOR) tablet 10 mg  10 mg Oral Daily Kyle, Tyrone A, DO   10 mg at 09/20/20 4782  . azithromycin (ZITHROMAX) 500 mg in sodium chloride 0.9 % 250 mL IVPB  500 mg Intravenous  Q24H Kyle, Tyrone A, DO   Stopped at 09/20/20 0536  . busPIRone (BUSPAR) tablet 15 mg  15 mg Oral TID Marylyn Ishihara, Tyrone A, DO   15 mg at 09/20/20 1001  . cefTRIAXone (ROCEPHIN) 2 g in sodium chloride 0.9 % 100 mL IVPB  2 g Intravenous Q24H Kyle, Tyrone A, DO   Stopped at 09/20/20 0429  . Chlorhexidine Gluconate Cloth 2 % PADS 6 each  6 each Topical Daily Kyle, Tyrone A, DO   6 each at 09/20/20 [phone]  . citalopram (CELEXA) tablet 40 mg  40 mg Oral Daily Kyle, Tyrone A, DO   40 mg at 09/20/20 0827  . diazepam (VALIUM) tablet 1 mg  1 mg Oral BID Marylyn Ishihara, Tyrone A, DO   1 mg at 09/20/20 1308  . docusate (COLACE) 50 MG/5ML liquid 100 mg  100 mg Oral Daily Shahmehdi, Seyed A, MD   100 mg at 09/20/20 1001  . fluticasone (FLONASE) 50 MCG/ACT nasal spray 1 spray  1 spray Each Nare Daily Kyle, Tyrone A, DO   1 spray at 09/20/20 0829  . lactated ringers infusion   Intravenous Continuous Shahmehdi, Seyed A, MD 125 mL/hr at 09/20/20 0900 Infusion Verify at 09/20/20 0900  . metroNIDAZOLE (FLAGYL) IVPB 500 mg  500 mg Intravenous Q8H Kyle, Tyrone A, DO   Stopped at 09/20/20 0716  . OLANZapine (ZYPREXA) tablet 1.25  mg  1.25 mg Oral QHS Kyle, Tyrone A, DO   1.25 mg at 09/19/20 2103  . ondansetron (ZOFRAN) tablet 4 mg  4 mg Oral Q6H PRN Marylyn Ishihara, Tyrone A, DO       Or  . ondansetron (ZOFRAN) injection 4 mg  4 mg Intravenous Q6H PRN Marylyn Ishihara, Tyrone A, DO      . pantoprazole sodium (PROTONIX) 40 mg/20 mL oral suspension 40 mg  40 mg Oral BID Marylyn Ishihara, Tyrone A, DO   40 mg at 09/20/20 0827  . polyethylene glycol (MIRALAX / GLYCOLAX) packet 17 g  17 g Oral QHS Kyle, Tyrone A, DO      . sucralfate (CARAFATE) tablet 1 g  1 g Oral BID Marylyn Ishihara, Tyrone A, DO   1 g at 09/20/20 0828    VITAL SIGNS: BP 101/83   Pulse 79   Temp 98.4 F (36.9 C) (Oral)   Resp (!) 23   Ht 5\' 2"  (1.575 m)   Wt 50 kg   SpO2 91%   BMI 20.16 kg/m  Filed Weights   09/19/20 1800  Weight: 50 kg    Estimated body mass index is 20.16 kg/m as calculated from the  following:   Height as of this encounter: 5\' 2"  (1.575 m).   Weight as of this encounter: 50 kg.  LABS: CBC:    Component Value Date/Time   WBC 15.4 (H) 09/20/2020 0220   HGB 10.9 (L) 09/20/2020 0220   HGB 13.3 11/30/2015 1307   HCT 35.7 (L) 09/20/2020 0220   HCT 41.3 11/30/2015 1307   PLT 152 09/20/2020 0220   PLT 220 11/30/2015 1307   Comprehensive Metabolic Panel:    Component Value Date/Time   NA 141 09/20/2020 0220   NA 140 11/30/2015 1307   K 3.4 (L) 09/20/2020 0220   BUN 11 09/20/2020 0220   BUN 9 11/30/2015 1307   CREATININE 0.45 09/20/2020 0220   ALBUMIN 2.5 (L) 09/20/2020 0220   ALBUMIN 3.9 11/30/2015 1307     Review of Systems  Unable to perform ROS: Mental status change   Prognosis: Guarded   Discharge Planning:  Per family return to [facility] facility with Palliative support  Recommendations: . DNR/DNI-as confirmed by [name] [facility]) . Continue with current plan of care  . No aggressive interventions, no artificial feedings/PEG . Family aware of patient's continued risk and challenges with aspiration. Reports she is followed by SLP @ facility. Treat the treatable. If patient was to further decline family would wish to focus on her comfort.  . Family confirms patient is actively being followed by outpatient Palliative Care and wishes to continue with their services. if patient was to further decline or show no improvement would wish to focus on her comfort and transition to hospice  . PMT will continue to support and follow as needed. Please call team line with urgent needs.   Palliative Performance Scale: PPS 20-30%                Denise (POA) expressed understanding and was in agreement with this plan.   Thank you for allowing the Palliative Medicine Team to assist in the care of this patient.  Time In: 1200 Time Out: 1300 Time Total: 60 min.  Visit consisted of counseling and education dealing with the complex and emotionally intense issues  of symptom management and palliative care in the setting of serious and potentially life-threatening illness.Greater than 50%  of this time was spent counseling and coordinating care related  to the above assessment and plan.  Signed by:  Alda Lea, AGPCNP-BC Palliative Medicine Team  Phone: [phone] Pager: [phone] Amion: Bjorn Pippin

## 2020-09-20 NOTE — Evaluation (Signed)
Clinical/Bedside Swallow Evaluation Patient Details  Name: [name] MRN: [id number] Date of Birth: 1936-01-30  Today's Date: 09/20/2020 Time: SLP Start Time (ACUTE ONLY): 1415 SLP Stop Time (ACUTE ONLY): 1435 SLP Time Calculation (min) (ACUTE ONLY): 20 min  Past Medical History:  Past Medical History:  Diagnosis Date  . Cerebral palsy ([place])   . Cognitive impairment   . Constipation   . Hyperlipidemia    Past Surgical History:  Past Surgical History:  Procedure Laterality Date  . ESOPHAGOGASTRODUODENOSCOPY  01/28/2012   Procedure: ESOPHAGOGASTRODUODENOSCOPY (EGD);  Surgeon: Lear Ng, MD;  Location: [facility] ENDOSCOPY;  Service: Endoscopy;  Laterality: N/A;  . ESOPHAGOGASTRODUODENOSCOPY N/A 05/10/2014   Procedure: ESOPHAGOGASTRODUODENOSCOPY (EGD);  Surgeon: Missy Sabins, MD;  Location: [facility] ENDOSCOPY;  Service: Endoscopy;  Laterality: N/A;  . right hip surgery    . TONSILLECTOMY     HPI:  Patient is an 85 y.o. female with PMH: cerebral palsy, depression, GERD presenting from [facility] with nausea and vomitting and concern for UTI. Upon admission to ED, patient was febrile with temp above 103 degrees, low BP. CXR was concerning for PNA and UA was concerning for UTI. CT abdomen, pelvis, chest consistent with multifocal pneumonia, mainly on right side, significant hiatal hernia, mediastinal lymphadenopathy. Patient has had multiple MBS studies with most recent being October of 2021 and at that time, Dys 1 nectar thick liquids was recommended.   Assessment / Plan / Recommendation Clinical Impression  Patient presents with a mild oropharyngeal dysphagia with SLP suspecting delayed swallow initiation and concern for some discoordination of oropharyngeal swallow. No overt s/s aspiraiton or penetration observed, however SpO2 decreased slightly from 91 to 89% during PO intake and then returning to 91% when PO intake finished. As patient has had h/o both oropharyngeal and esophageal  dysphagias, with several MBS studies completed in past few years (most recent 04/2020), SLP to continue to follow patient for diet toleration and determine benefit/need for MBS. (seen by Palliative care at her SNF) SLP Visit Diagnosis: Dysphagia, unspecified (R13.10)    Aspiration Risk  Mild aspiration risk    Diet Recommendation Thin liquid;Other (Comment) (continue with full liquids)   Liquid Administration via: Cup;Straw Medication Administration: Crushed with puree Supervision: Full supervision/cueing for compensatory strategies;Patient able to self feed;Staff to assist with self feeding Compensations: Minimize environmental distractions;Slow rate;Small sips/bites Postural Changes: Seated upright at 90 degrees;Remain upright for at least 30 minutes after po intake    Other  Recommendations Oral Care Recommendations: Oral care BID;Staff/trained caregiver to provide oral care   Follow up Recommendations 24 hour supervision/assistance;Skilled Nursing facility      Frequency and Duration min 2x/week  1 week       Prognosis Prognosis for Safe Diet Advancement: Fair Barriers to Reach Goals: Severity of deficits;Time post onset Barriers/Prognosis Comment: patient has history of oropharyngeal and esophageal dysphagia which both impact her swallow      Swallow Study   General Date of Onset: 09/19/20 HPI: Patient is an 85 y.o. female with PMH: cerebral palsy, depression, GERD presenting from [facility] with nausea and vomitting and concern for UTI. Upon admission to ED, patient was febrile with temp above 103 degrees, low BP. CXR was concerning for PNA and UA was concerning for UTI. CT abdomen, pelvis, chest consistent with multifocal pneumonia, mainly on right side, significant hiatal hernia, mediastinal lymphadenopathy. Patient has had multiple MBS studies with most recent being October of 2021 and at that time, Dys 1 nectar thick liquids was  recommended. Type of Study: Bedside  Swallow Evaluation Previous Swallow Assessment: remote; 04/2020 Diet Prior to this Study: Thin liquids;Other (Comment) (full liquids) Temperature Spikes Noted: No Respiratory Status: Nasal cannula History of Recent Intubation: No Behavior/Cognition: Alert;Cooperative;Pleasant mood Oral Cavity Assessment: Within Functional Limits Oral Care Completed by SLP: No Oral Cavity - Dentition: Dentures, top;Dentures, bottom Vision: Functional for self-feeding Self-Feeding Abilities: Able to feed self;Needs assist;Needs set up Patient Positioning: Upright in bed Baseline Vocal Quality: Other (comment) (slight tremor and strained) Volitional Cough: Weak    Oral/Motor/Sensory Function Overall Oral Motor/Sensory Function: Generalized oral weakness Facial ROM: Within Functional Limits Facial Symmetry: Within Functional Limits Lingual ROM: Reduced right;Reduced left Lingual Symmetry: Within Functional Limits Lingual Strength: Reduced   Ice Chips     Thin Liquid Thin Liquid: Impaired Pharyngeal  Phase Impairments: Suspected delayed Swallow    Nectar Thick     Honey Thick     Puree Puree: Within functional limits   Solid     Solid: Not tested      Sonia Baller, MA, CCC-SLP Speech Therapy

## 2020-09-20 NOTE — TOC Initial Note (Signed)
Transition of Care [facility]) - Initial/Assessment Note    Patient Details  Name: [name] MRN: [id number] Date of Birth: July 22, 1936  [facility] CM/SW Contact:    Leeroy Cha, RN Phone Number: 09/20/2020, 8:29 AM  Clinical Narrative:                  85 y.o. female with medical history significant of cerebral palsy, depression, GERD. Presenting with N/V. History is from chart review as patient is a poor historian. Apparently she has been vomiting feculent material since yesterday. Her SNF had concern that she had a UTI, so they started treating her for that with an unspecified abx. When her vomiting was observed, EMS was called for transport to the ED. There are no other noted aggravating or alleviating factors.  ED Course: She was found to be febrile (103+), tachycardic, and tachypneic. She was also hypotensive. She was given 3.5L in fluid boluses, but her BP remained low. She was going to start pressors, but her BP finally responded. Her CXR was concerning for PNA. Her UA was concerning for UTI. She was started on sepsis protocol. She was reviewed by PCCM. Ok'd to go to floor. TRH was called for admission.   PLAN: to return to home once uti cleared and mentation cleared.  Expected Discharge Plan: Home/Self Care Barriers to Discharge: Continued Medical Work up   Patient Goals and CMS Choice Patient states their goals for this hospitalization and ongoing recovery are:: pt confused at time of assessment CMS Medicare.gov Compare Post Acute Care list provided to:: Patient Represenative (must comment) (husband) Choice offered to / list presented to : Spouse  Expected Discharge Plan and Services Expected Discharge Plan: Home/Self Care   Discharge Planning Services: CM Consult   Living arrangements for the past 2 months: Single Family Home                                      Prior Living Arrangements/Services Living arrangements for the past 2 months:  Single Family Home Lives with:: Spouse   Do you feel safe going back to the place where you live?: Yes (yes per the husband)               Activities of Daily Living Home Assistive Devices/Equipment: Wheelchair,Hospital bed ADL Screening (condition at time of admission) Patient's cognitive ability adequate to safely complete daily activities?: Yes Is the patient deaf or have difficulty hearing?: No Does the patient have difficulty seeing, even when wearing glasses/contacts?: No Does the patient have difficulty concentrating, remembering, or making decisions?: Yes Patient able to express need for assistance with ADLs?: Yes Does the patient have difficulty dressing or bathing?: Yes Independently performs ADLs?: No Communication: Independent Dressing (OT): Dependent Is this a change from baseline?: Pre-admission baseline Grooming: Dependent Is this a change from baseline?: Pre-admission baseline Feeding: Independent Bathing: Dependent Is this a change from baseline?: Pre-admission baseline Toileting: Dependent Is this a change from baseline?: Pre-admission baseline In/Out Bed: Dependent Is this a change from baseline?: Pre-admission baseline Walks in Home: Dependent Is this a change from baseline?: Pre-admission baseline Does the patient have difficulty walking or climbing stairs?: Yes Weakness of Legs: Both Weakness of Arms/Hands: None  Permission Sought/Granted                  Emotional Assessment Appearance:: Appears stated age Attitude/Demeanor/Rapport: Other (comment) (confused)  Orientation: : Fluctuating Orientation (Suspected and/or reported Sundowners) Alcohol / Substance Use: Not Applicable Psych Involvement: No (comment)  Admission diagnosis:  Hypokalemia [E87.6] Nausea and vomiting in adult [R11.2] Septic shock ([place]) [A41.9, R65.21] Sepsis ([place]) [A41.9] Sepsis without acute organ dysfunction, due to unspecified organism ([place]) [A41.9] COVID-19 virus  not detected [Z20.822] Patient Active Problem List   Diagnosis Date Noted  . Sepsis ([place]) 09/19/2020  . Choking episode   . Hypoxia   . Aspiration pneumonia ([place]) 04/26/2020  . Dysphagia 11/30/2015  . Hiatal hernia with gastroesophageal reflux 05/10/2014  . Spastic paraplegia 05/06/2014  . C. difficile colitis 03/27/2012  . Colitis 03/26/2012  . Diarrhea 03/25/2012  . UTI (lower urinary tract infection) 03/25/2012  . Cerebral palsy ([place]) 03/25/2012  . H/O: GI bleed 03/25/2012  . Esophageal ulcer 01/29/2012  . Hypokalemia 01/29/2012  . Anemia 01/29/2012  . Acute upper GI bleed 01/28/2012  . Hematemesis 01/28/2012   PCP:  Reynold Bowen, MD Pharmacy:  No Pharmacies Listed    Social Determinants of Health (SDOH) Interventions    Readmission Risk Interventions Readmission Risk Prevention Plan 04/28/2020  Transportation Screening Complete  PCP or Specialist Appt within 5-7 Days Not Complete  Not Complete comments SNF resident  Home Care Screening Not Complete  Home Care Screening Not Completed Comments SNF resident  Medication Review (RN CM) Referral to Pharmacy  Some recent data might be hidden

## 2020-09-20 NOTE — Progress Notes (Signed)
PROGRESS NOTE    Patient: [name]                            PCP: Reynold Bowen, MD                    DOB: 1936-01-02            DOA: 09/19/2020 IEP:[id number]             DOS: 09/20/2020, 9:02 AM   LOS: 1 day   Date of Service: The patient was seen and examined on 09/20/2020  Subjective:   The patient was seen and examined this morning. Stable at this time.,  Able to communicate and cooperate appropriately   Brief Narrative:   [name] is a 85 y.o. female with medical history significant of cerebral palsy, depression, GERD. Presenting with N/V. History is from chart review as patient is a poor historian. Apparently she has been vomiting ?  feculent material y. Her SNF had concern that she had a UTI, so they started treating her for that with an unspecified abx.  ED Course: Febrile (103+), tachycardic, and tachypneic. She was also hypotensive. She was given 3.5L in fluid boluses, but her BP remained low. She was going to start pressors, but her BP finally responded.  Her CXR was concerning for PNA. Her UA was concerning for UTI. She was started on sepsis protocol.  She was reviewed by PCCM. CT abdomen pelvis and chest was reviewed consistent with multifocal pneumonia, mainly on right side, significant hiatal hernia, mediastinal lymphadenopathy  Assessment & Plan:   Active Problems:   Sepsis ([place])   Severe sepsis likely secondary to aspiration PNA and UTI     -Likely source of infection multifocal pneumonia, UTI -On admission met severe sepsis criteria being febrile tachycardic, tachypneic, leukocytosis, lactic acidosis, hypotension -Sepsis protocol was followed broad-spectrum antibiotic, aggressive IV fluid resuscitation -Sepsis physiology has improved -We will continue with maintenance fluid -Continue broad-spectrum antibiotics, will follow culture -Questionable materials with nausea vomiting patient has not had an episode since admission, CT abdomen pelvis and chest  was reviewed in detail -significant finding for multifocal pneumonia specifically on the right, questionable aspiration, mediastinal lymphadenopathy, severe hiatal hernia -Trending lactic acid 3.2, 1.4 today, procalcitonin 1.47,  Severe hypotension -Likely through sepsis physiology, responding to IV fluids, blood pressure improving  Nausea /vomiting -No further episodes since admission, questionable aspiration based on CT findings -?  Solid matter with nausea vomiting likely due to gastric content,  CT chest abdomen, pelvis finding consistent with severe hiatal hernia, 75% of stomach in the chest -Will advance diet as tolerated   Hypokalemia     - given 40 IV K+ in ED, rpt lab, check Mg2+; replace as necessary -Monitoring very closely,  Cerebral palsy / Contractures lower extremities     - continue antispasmodics as able -Contracted neck, positioning may be contributing to possible aspiration from nausea vomiting  HLD     -We will continue statin  GERD     -We will continue protonix, carafate -We will continue Protonix IV today  DVT prophylaxis: SCDs  Code Status: DNR  Family Communication: None at bedside.  Consults called: None  Status is: Inpatient  Remains inpatient appropriate because:Inpatient level of care appropriate due to severity of illness   Dispo: The patient is from: SNF  Anticipated d/c is to: SNF  Anticipated d/c date is: 3 days  Patient  currently is not medically stable to d/c.              Difficult to place patient No        ----------------------------------------------------------------------------------------------------------------------------------------------- Nutritional status:  The patient's BMI is: Body mass index is 20.16 kg/m. I agree with the assessment and plan as outlined  ...  ---------------------------------------------------------------------------------------------------------------------------------------------------- Cultures; Blood Cultures x 2 >>  Urine Culture  >>>    Antimicrobials: 09/19/2020 IV Rocephin, azithromycin, Flagyl >>    Consultants: PCCM   Family Communication:  No family member present at bedside- attempt will be made to update daily Level of care: Stepdown   Procedures:   No admission procedures for hospital encounter.     Antimicrobials:  Anti-infectives (From admission, onward)   Start     Dose/Rate Route Frequency Ordered Stop   09/20/20 0400  cefTRIAXone (ROCEPHIN) 2 g in sodium chloride 0.9 % 100 mL IVPB        2 g 200 mL/hr over 30 Minutes Intravenous Every 24 hours 09/19/20 1801     09/20/20 0400  azithromycin (ZITHROMAX) 500 mg in sodium chloride 0.9 % 250 mL IVPB        500 mg 250 mL/hr over 60 Minutes Intravenous Every 24 hours 09/19/20 1801     09/19/20 1500  metroNIDAZOLE (FLAGYL) IVPB 500 mg        500 mg 100 mL/hr over 60 Minutes Intravenous Every 8 hours 09/19/20 1456     09/19/20 0330  cefTRIAXone (ROCEPHIN) 1 g in sodium chloride 0.9 % 100 mL IVPB        1 g 200 mL/hr over 30 Minutes Intravenous  Once 09/19/20 0318 09/19/20 0423   09/19/20 0330  azithromycin (ZITHROMAX) 500 mg in sodium chloride 0.9 % 250 mL IVPB        500 mg 250 mL/hr over 60 Minutes Intravenous  Once 09/19/20 0318 09/19/20 0535       Medication:  . atorvastatin  10 mg Oral Daily  . busPIRone  15 mg Oral TID  . Chlorhexidine Gluconate Cloth  6 each Topical Daily  . citalopram  40 mg Oral Daily  . diazepam  1 mg Oral BID  . docusate  100 mg Oral Daily  . fluticasone  1 spray Each Nare Daily  . OLANZapine  1.25 mg Oral QHS  . pantoprazole sodium  40 mg Oral BID  . polyethylene glycol  17 g Oral QHS  . sucralfate  1 g Oral BID    acetaminophen **OR** acetaminophen, ondansetron **OR** ondansetron (ZOFRAN)  IV   Objective:   Vitals:   09/20/20 0558 09/20/20 0559 09/20/20 0600 09/20/20 0800  BP:   (!) 104/43   Pulse:   70   Resp:   15   Temp:    98.4 F (36.9 C)  TempSrc:    Oral  SpO2: 90% (!) 88% 91%   Weight:      Height:        Intake/Output Summary (Last 24 hours) at 09/20/2020 0902 Last data filed at 09/20/2020 0408 Gross per 24 hour  Intake 2117.23 ml  Output 350 ml  Net 1767.23 ml   Filed Weights   09/19/20 1800  Weight: 50 kg     Examination:   Physical Exam  Constitution:  Alert, cooperative, no distress,  Appears calm and comfortable  Psychiatric: Normal and stable mood and affect, cognition intact,   HEENT: Normocephalic, PERRL, otherwise with in Normal limits  Chest:Chest symmetric Cardio vascular:  S1/S2,  RRR, No murmure, No Rubs or Gallops  pulmonary: Clear to auscultation bilaterally, respirations unlabored, negative wheezes / crackles Abdomen: Soft, non-tender, non-distended, bowel sounds,no masses, no organomegaly Muscular skeletal:  Contracted lower extremities, Limited exam - in bed, able to move all 4 extremities, Normal strength,  Neuro: CNII-XII intact. , normal motor and sensation, reflexes intact  Extremities:  Contracted lower extremities no pitting edema lower extremities, +2 pulses  Skin: Dry, warm to touch, negative for any Rashes, No open wounds Wounds: per nursing documentation    ------------------------------------------------------------------------------------------------------------------------------------------    LABs:  CBC Latest Ref Rng & Units 09/20/2020 09/19/2020 05/03/2020  WBC 4.0 - 10.5 K/uL 15.4(H) 14.7(H) 11.4(H)  Hemoglobin 12.0 - 15.0 g/dL 10.9(L) 13.5 11.1(L)  Hematocrit 36.0 - 46.0 % 35.7(L) 42.2 34.7(L)  Platelets 150 - 400 K/uL 152 209 239   CMP Latest Ref Rng & Units 09/20/2020 09/19/2020 09/19/2020  Glucose 70 - 99 mg/dL 87 - 90  BUN 8 - 23 mg/dL 11 - 12  Creatinine 0.44 - 1.00 mg/dL 0.45 - 0.49  Sodium 135 -  145 mmol/L 141 - 141  Potassium 3.5 - 5.1 mmol/L 3.4(L) - 3.8  Chloride 98 - 111 mmol/L 111 - 112(H)  CO2 22 - 32 mmol/L 21(L) - 23  Calcium 8.9 - 10.3 mg/dL 7.8(L) - 7.4(L)  Total Protein 6.5 - 8.1 g/dL 5.4(L) 5.4(L) 5.0(L)  Total Bilirubin 0.3 - 1.2 mg/dL 0.7 0.6 0.6  Alkaline Phos 38 - 126 U/L 56 52 53  AST 15 - 41 U/L 19 20 21   ALT 0 - 44 U/L 13 11 13        Micro Results Recent Results (from the past 240 hour(s))  Blood culture (routine x 2)     Status: None (Preliminary result)   Collection Time: 09/19/20  3:24 AM   Specimen: BLOOD  Result Value Ref Range Status   Specimen Description   Final    BLOOD SITE NOT SPECIFIED Performed at [facility], [address], [place], [place] [address]    Special Requests   Final    BOTTLES DRAWN AEROBIC AND ANAEROBIC Blood Culture adequate volume Performed at [facility] [address] [place], [place] [address]    Culture   Final    NO GROWTH 1 DAY Performed at [facility], [place] [address] [place], [place] [address]    Report Status PENDING  Incomplete  Blood culture (routine x 2)     Status: None (Preliminary result)   Collection Time: 09/19/20  3:25 AM   Specimen: BLOOD  Result Value Ref Range Status   Specimen Description   Final    BLOOD SITE NOT SPECIFIED Performed at [facility] [address], [place], [place] [address]    Special Requests   Final    BOTTLES DRAWN AEROBIC AND ANAEROBIC Blood Culture results may not be optimal due to an inadequate volume of blood received in culture bottles Performed at [facility] [address], [place], [place] [address]    Culture   Final    NO GROWTH 1 DAY Performed at [facility], [place] [address], [place], [place] [address]    Report Status PENDING  Incomplete  Resp Panel by RT-PCR (Flu A&B, Covid) Nasopharyngeal Swab     Status: None   Collection Time: 09/19/20  3:25 AM   Specimen: Nasopharyngeal Swab;  Nasopharyngeal(NP) swabs in vial transport medium  Result Value Ref Range Status  SARS Coronavirus 2 by RT PCR NEGATIVE NEGATIVE Final    Comment: (NOTE) SARS-CoV-2 target nucleic acids are NOT DETECTED.  The SARS-CoV-2 RNA is generally detectable in upper respiratory specimens during the acute phase of infection. The lowest concentration of SARS-CoV-2 viral copies this assay can detect is 138 copies/mL. A negative result does not preclude SARS-Cov-2 infection and should not be used as the sole basis for treatment or other patient management decisions. A negative result may occur with  improper specimen collection/handling, submission of specimen other than nasopharyngeal swab, presence of viral mutation(s) within the areas targeted by this assay, and inadequate number of viral copies(<138 copies/mL). A negative result must be combined with clinical observations, patient history, and epidemiological information. The expected result is Negative.  Fact Sheet for Patients:  EntrepreneurPulse.com.au  Fact Sheet for Healthcare Providers:  IncredibleEmployment.be  This test is no t yet approved or cleared by the [place] FDA and  has been authorized for detection and/or diagnosis of SARS-CoV-2 by FDA under an Emergency Use Authorization (EUA). This EUA will remain  in effect (meaning this test can be used) for the duration of the COVID-19 declaration under Section 564(b)(1) of the Act, 21 U.S.C.section 360bbb-3(b)(1), unless the authorization is terminated  or revoked sooner.       Influenza A by PCR NEGATIVE NEGATIVE Final   Influenza B by PCR NEGATIVE NEGATIVE Final    Comment: (NOTE) The Xpert Xpress SARS-CoV-2/FLU/RSV plus assay is intended as an aid in the diagnosis of influenza from Nasopharyngeal swab specimens and should not be used as a sole basis for treatment. Nasal washings and aspirates are unacceptable for Xpert Xpress  SARS-CoV-2/FLU/RSV testing.  Fact Sheet for Patients: EntrepreneurPulse.com.au  Fact Sheet for Healthcare Providers: IncredibleEmployment.be  This test is not yet approved or cleared by the [place] FDA and has been authorized for detection and/or diagnosis of SARS-CoV-2 by FDA under an Emergency Use Authorization (EUA). This EUA will remain in effect (meaning this test can be used) for the duration of the COVID-19 declaration under Section 564(b)(1) of the Act, 21 U.S.C. section 360bbb-3(b)(1), unless the authorization is terminated or revoked.  Performed at [facility], [place] [address] [place], [place] [address]     Radiology Reports CT CHEST WO CONTRAST  Result Date: 09/19/2020 CLINICAL DATA:  Nausea vomiting.  Sepsis. EXAM: CT CHEST, ABDOMEN, AND PELVIS WITH CONTRAST TECHNIQUE: Multidetector CT imaging of the chest, abdomen and pelvis was performed following the standard protocol during bolus administration of intravenous contrast. CONTRAST:  159m OMNIPAQUE IOHEXOL 300 MG/ML  SOLN COMPARISON:  CT chest 04/26/2020. abdomen/pelvis CT 05/10/2014. FINDINGS: CT CHEST FINDINGS Cardiovascular: Heart size upper normal. No substantial pericardial effusion. Atherosclerotic calcification is noted in the wall of the thoracic aorta. Mediastinum/Nodes: 6.9 x 5.4 cm left thyroid mass measures slightly larger than on the prior study when it was reported at 6.1 x 4.3 cm. 12 mm short axis prevascular lymph node is stable in the interval. 13 mm short axis low right paratracheal node is similar to prior no axillary lymphadenopathy. Large hiatal hernia evident with more than 75% of the stomach contained in the chest. Lungs/Pleura: Centrilobular emphsyema noted. Dense airspace consolidation noted posterior right upper lobe, right middle lobe and right lower lobe. There is consolidative airspace disease in the left lower lobe as well. Areas of  irregular nodular airspace disease in the mid right upper lobe likely related to the more diffuse process in the right lung. Tiny right pleural effusion  noted. Multiple small lymph nodes in collateral vessels are seen in the right thoracic inlet. Musculoskeletal: No worrisome lytic or sclerotic osseous abnormality. CT ABDOMEN PELVIS FINDINGS Hepatobiliary: No suspicious focal abnormality within the liver parenchyma. There is no evidence for gallstones, gallbladder wall thickening, or pericholecystic fluid. No intrahepatic or extrahepatic biliary dilation. Pancreas: No focal mass lesion. No dilatation of the main duct. No intraparenchymal cyst. No peripancreatic edema. Spleen: No splenomegaly. No focal mass lesion. Adrenals/Urinary Tract: No adrenal nodule or mass. 3 cm cyst in the interpolar left kidney is new in the interval. Are too small to characterize. Additional tiny hypodensities in the left kidney right kidney unremarkable. No evidence for hydroureter. The urinary bladder appears normal for the degree of distention. Stomach/Bowel: As above, there is a large hiatal hernia. Duodenum is normally positioned as is the ligament of Treitz. No small bowel wall thickening. No small bowel dilatation. The terminal ileum is normal. The appendix is normal. No gross colonic mass. No colonic wall thickening. Vascular/Lymphatic: There is abdominal aortic atherosclerosis without aneurysm. Portal vein and superior mesenteric vein are patent. Splenic vein is patent. Celiac axis and SMA are opacified. IMA is opacified. There is no gastrohepatic or hepatoduodenal ligament lymphadenopathy. No retroperitoneal or mesenteric lymphadenopathy. No pelvic sidewall lymphadenopathy. Reproductive: The uterus is unremarkable.  There is no adnexal mass. Other: No intraperitoneal free fluid. Musculoskeletal: Bones are diffusely demineralized. Surgical hardware noted proximal right femur IMPRESSION: 1. Dense airspace consolidation in the right  upper lobe, right middle lobe, and right lower lobe with consolidative airspace disease in the left lower lobe. Imaging features are compatible with multifocal pneumonia. Ill-defined nodular opacities in the central right upper lobe are likely related. Follow-up recommended to ensure resolution. 2. Tiny right pleural effusion. 3. Stable mild mediastinal lymphadenopathy, likely reactive. 4. 7 cm left thyroid nodule, similar to CT chest 04/26/2020. Recommend thyroid US. (Ref: J Am Coll Radiol. 2015 Feb;12(2): 143-50). 5. No acute findings in the abdomen or pelvis. 6. Large hiatal hernia with more than 75% of the stomach contained in the chest. 7. Interval development of a 3 cm cyst in the interpolar left kidney with other tiny hypodensities in the left kidney too small to characterize. 8. Aortic Atherosclerosis (ICD10-I70.0) and Emphysema (ICD10-J43.9). Electronically Signed   By: Misty Stanley M.D.   On: 09/19/2020 15:16   CT ABDOMEN PELVIS W CONTRAST  Result Date: 09/19/2020 CLINICAL DATA:  Nausea vomiting.  Sepsis. EXAM: CT CHEST, ABDOMEN, AND PELVIS WITH CONTRAST TECHNIQUE: Multidetector CT imaging of the chest, abdomen and pelvis was performed following the standard protocol during bolus administration of intravenous contrast. CONTRAST:  173m OMNIPAQUE IOHEXOL 300 MG/ML  SOLN COMPARISON:  CT chest 04/26/2020. abdomen/pelvis CT 05/10/2014. FINDINGS: CT CHEST FINDINGS Cardiovascular: Heart size upper normal. No substantial pericardial effusion. Atherosclerotic calcification is noted in the wall of the thoracic aorta. Mediastinum/Nodes: 6.9 x 5.4 cm left thyroid mass measures slightly larger than on the prior study when it was reported at 6.1 x 4.3 cm. 12 mm short axis prevascular lymph node is stable in the interval. 13 mm short axis low right paratracheal node is similar to prior no axillary lymphadenopathy. Large hiatal hernia evident with more than 75% of the stomach contained in the chest. Lungs/Pleura:  Centrilobular emphsyema noted. Dense airspace consolidation noted posterior right upper lobe, right middle lobe and right lower lobe. There is consolidative airspace disease in the left lower lobe as well. Areas of irregular nodular airspace disease in the mid right upper lobe  likely related to the more diffuse process in the right lung. Tiny right pleural effusion noted. Multiple small lymph nodes in collateral vessels are seen in the right thoracic inlet. Musculoskeletal: No worrisome lytic or sclerotic osseous abnormality. CT ABDOMEN PELVIS FINDINGS Hepatobiliary: No suspicious focal abnormality within the liver parenchyma. There is no evidence for gallstones, gallbladder wall thickening, or pericholecystic fluid. No intrahepatic or extrahepatic biliary dilation. Pancreas: No focal mass lesion. No dilatation of the main duct. No intraparenchymal cyst. No peripancreatic edema. Spleen: No splenomegaly. No focal mass lesion. Adrenals/Urinary Tract: No adrenal nodule or mass. 3 cm cyst in the interpolar left kidney is new in the interval. Are too small to characterize. Additional tiny hypodensities in the left kidney right kidney unremarkable. No evidence for hydroureter. The urinary bladder appears normal for the degree of distention. Stomach/Bowel: As above, there is a large hiatal hernia. Duodenum is normally positioned as is the ligament of Treitz. No small bowel wall thickening. No small bowel dilatation. The terminal ileum is normal. The appendix is normal. No gross colonic mass. No colonic wall thickening. Vascular/Lymphatic: There is abdominal aortic atherosclerosis without aneurysm. Portal vein and superior mesenteric vein are patent. Splenic vein is patent. Celiac axis and SMA are opacified. IMA is opacified. There is no gastrohepatic or hepatoduodenal ligament lymphadenopathy. No retroperitoneal or mesenteric lymphadenopathy. No pelvic sidewall lymphadenopathy. Reproductive: The uterus is unremarkable.   There is no adnexal mass. Other: No intraperitoneal free fluid. Musculoskeletal: Bones are diffusely demineralized. Surgical hardware noted proximal right femur  IMPRESSION:  1. Dense airspace consolidation in the right upper lobe, right middle lobe, and right lower lobe with consolidative airspace disease in the left lower lobe. Imaging features are compatible with multifocal pneumonia. Ill-defined nodular opacities in the central right upper lobe are likely related. Follow-up recommended to ensure resolution.  2. Tiny right pleural effusion.  3. Stable mild mediastinal lymphadenopathy, likely reactive. 4. 7 cm left thyroid nodule, similar to CT chest 04/26/2020. Recommend thyroid US. (Ref: J Am Coll Radiol. 2015 Feb;12(2): 143-50). 5. No acute findings in the abdomen or pelvis. 6. Large hiatal hernia with more than 75% of the stomach contained in the chest. 7. Interval development of a 3 cm cyst in the interpolar left kidney with other tiny hypodensities in the left kidney too small to characterize. 8. Aortic Atherosclerosis (ICD10-I70.0) and Emphysema (ICD10-J43.9). Electronically Signed   By: Misty Stanley M.D.   On: 09/19/2020 15:16   DG Chest Port 1 View  Result Date: 09/19/2020 CLINICAL DATA:  Hypoxia after fluid resuscitation.  Vomiting. EXAM: PORTABLE CHEST 1 VIEW COMPARISON:  Earlier today FINDINGS: Cardiomegaly and vascular pedicle widening accentuated by rotation. Additional tracheal mass effect from a large left thyroid nodule extending into the mediastinum. Large hiatal hernia. Vascular congestion. Asymmetric indistinct density at the left base. No Kerley lines or effusion. No pneumothorax. IMPRESSION: 1. Vascular congestion. 2. Cannot exclude a left lower lobe infiltrate, certainty limited by patient rotation 3. Large hiatal hernia. Electronically Signed   By: Monte Fantasia M.D.   On: 09/19/2020 07:30   DG Chest Port 1 View  Result Date: 09/19/2020 CLINICAL DATA:  Vomiting feces since  yesterday. EXAM: PORTABLE CHEST 1 VIEW COMPARISON:  May 03, 2020 FINDINGS: The study is limited secondary to patient positioning. Stable, chronic appearing increased lung markings are noted, bilaterally. Very mild right suprahilar and bilateral infrahilar atelectasis and/or infiltrate is noted. There is no evidence of a pleural effusion or pneumothorax. Stable enlargement of the cardiac silhouette  is seen. Multilevel degenerative changes are noted throughout the thoracic spine. IMPRESSION: Mild right suprahilar and bilateral infrahilar atelectasis and/or infiltrate. Electronically Signed   By: Virgina Norfolk M.D.   On: 09/19/2020 02:52    SIGNED: Deatra James, MD, FHM. [facility],  Pager (please use [email or web address] to page/text) Please use Epic Secure Chat for non-urgent communication (7AM-7PM)  If 7PM-7AM, please contact night-coverage [email or web address], 09/20/2020, 9:02 AM

## 2020-09-21 ENCOUNTER — Inpatient Hospital Stay (HOSPITAL_COMMUNITY): Payer: Medicare Other

## 2020-09-21 DIAGNOSIS — Z20822 Contact with and (suspected) exposure to covid-19: Secondary | ICD-10-CM

## 2020-09-21 LAB — COMPREHENSIVE METABOLIC PANEL
ALT: 13 U/L (ref 0–44)
AST: 18 U/L (ref 15–41)
Albumin: 2.6 g/dL — ABNORMAL LOW (ref 3.5–5.0)
Alkaline Phosphatase: 60 U/L (ref 38–126)
Anion gap: 10 (ref 5–15)
BUN: 7 mg/dL — ABNORMAL LOW (ref 8–23)
CO2: 23 mmol/L (ref 22–32)
Calcium: 8.3 mg/dL — ABNORMAL LOW (ref 8.9–10.3)
Chloride: 111 mmol/L (ref 98–111)
Creatinine, Ser: 0.44 mg/dL (ref 0.44–1.00)
GFR, Estimated: >60 mL/min (ref >60)
Glucose, Bld: 100 mg/dL — ABNORMAL HIGH (ref 70–99)
Potassium: 2.7 mmol/L — CL (ref 3.5–5.1)
Sodium: 144 mmol/L (ref 135–145)
Total Bilirubin: 0.7 mg/dL (ref 0.3–1.2)
Total Protein: 5.5 g/dL — ABNORMAL LOW (ref 6.5–8.1)

## 2020-09-21 LAB — CBC
HCT: 33.2 % — ABNORMAL LOW (ref 36.0–46.0)
Hemoglobin: 10.5 g/dL — ABNORMAL LOW (ref 12.0–15.0)
MCH: 28.7 pg (ref 26.0–34.0)
MCHC: 31.6 g/dL (ref 30.0–36.0)
MCV: 90.7 fL (ref 80.0–100.0)
Platelets: 183 10*3/uL (ref 150–400)
RBC: 3.66 MIL/uL — ABNORMAL LOW (ref 3.87–5.11)
RDW: 15.8 % — ABNORMAL HIGH (ref 11.5–15.5)
WBC: 11.5 10*3/uL — ABNORMAL HIGH (ref 4.0–10.5)
nRBC: 0 % (ref 0.0–0.2)

## 2020-09-21 LAB — BRAIN NATRIURETIC PEPTIDE: B Natriuretic Peptide: 596.9 pg/mL — ABNORMAL HIGH (ref 0.0–100.0)

## 2020-09-21 LAB — MAGNESIUM: Magnesium: 1.6 mg/dL — ABNORMAL LOW (ref 1.7–2.4)

## 2020-09-21 MED ORDER — METOPROLOL TARTRATE 5 MG/5ML IV SOLN
5.0000 mg | Freq: Once | INTRAVENOUS | Status: AC
  Administered 2020-09-21: 5 mg via INTRAVENOUS

## 2020-09-21 MED ORDER — POTASSIUM CHLORIDE 10 MEQ/100ML IV SOLN
10.0000 meq | INTRAVENOUS | Status: AC
  Administered 2020-09-21 (×2): 10 meq via INTRAVENOUS
  Filled 2020-09-21: qty 100

## 2020-09-21 MED ORDER — PANTOPRAZOLE SODIUM 40 MG PO PACK
40.0000 mg | PACK | Freq: Every day | ORAL | Status: DC
  Administered 2020-09-22 – 2020-09-25 (×4): 40 mg via ORAL
  Filled 2020-09-21 (×4): qty 20

## 2020-09-21 MED ORDER — ASPIRIN EC 81 MG PO TBEC
81.0000 mg | DELAYED_RELEASE_TABLET | Freq: Every day | ORAL | Status: DC
Start: 2020-09-21 — End: 2020-09-25
  Administered 2020-09-21 – 2020-09-25 (×5): 81 mg via ORAL
  Filled 2020-09-21 (×5): qty 1

## 2020-09-21 MED ORDER — MAGNESIUM SULFATE 2 GM/50ML IV SOLN
2.0000 g | Freq: Once | INTRAVENOUS | Status: AC
  Administered 2020-09-21: 2 g via INTRAVENOUS
  Filled 2020-09-21: qty 50

## 2020-09-21 MED ORDER — METOPROLOL TARTRATE 5 MG/5ML IV SOLN
5.0000 mg | Freq: Four times a day (QID) | INTRAVENOUS | Status: DC | PRN
  Administered 2020-09-21: 5 mg via INTRAVENOUS
  Filled 2020-09-21 (×2): qty 5

## 2020-09-21 MED ORDER — HYDRALAZINE HCL 20 MG/ML IJ SOLN
10.0000 mg | INTRAMUSCULAR | Status: DC | PRN
  Administered 2020-09-21: 10 mg via INTRAVENOUS
  Filled 2020-09-21: qty 1

## 2020-09-21 MED ORDER — METOPROLOL TARTRATE 25 MG PO TABS
25.0000 mg | ORAL_TABLET | Freq: Two times a day (BID) | ORAL | Status: DC
  Administered 2020-09-21 – 2020-09-25 (×9): 25 mg via ORAL
  Filled 2020-09-21 (×9): qty 1

## 2020-09-21 MED ORDER — POTASSIUM CHLORIDE CRYS ER 20 MEQ PO TBCR
40.0000 meq | EXTENDED_RELEASE_TABLET | Freq: Once | ORAL | Status: AC
  Administered 2020-09-21: 40 meq via ORAL
  Filled 2020-09-21: qty 2

## 2020-09-21 MED ORDER — METOPROLOL TARTRATE 5 MG/5ML IV SOLN
INTRAVENOUS | Status: AC
  Filled 2020-09-21: qty 5

## 2020-09-21 MED ORDER — FUROSEMIDE 10 MG/ML IJ SOLN
40.0000 mg | Freq: Once | INTRAMUSCULAR | Status: AC
  Administered 2020-09-21: 40 mg via INTRAVENOUS
  Filled 2020-09-21: qty 4

## 2020-09-21 NOTE — Progress Notes (Signed)
   Daily Progress Note   Patient Name: [name]       Date: 09/21/2020 DOB: [birth year]  Age: 85 y.o. MRN#: [id number] Attending Physician: Deatra James, MD Primary Care Physician: Reynold Bowen, MD Admit Date: 09/19/2020  Reason for Consultation/Follow-up: Establishing goals of care  Subjective: Chart Reviewed and Updates Received.   No acute distress. Patient did have a new onset of a-fib overnight which she reverted back to normal sinus (IV metoprolol).   Updates provided to [facility] (cousin/[facility]). Reviewed previous goals of care discussion. Family continues to request to treat the treatable. At this time no immediate concerns with initiating anti-coagulants given no recurrent a-fib. Family would wish to consider further if required given her co-morbidities and frailty due to CP unsure if they would consider this knowing the risk involved with bleeding.   Family remains hopeful for continued stability and discharge back to [facility] with Palliative support once medical care is optimized.   All questions answered and support provided.   Length of Stay: 2 days  Vital Signs: BP (!) 108/55   Pulse 70   Temp 98.6 F (37 C) (Oral)   Resp (!) 25   Ht 5\' 2"  (1.575 m)   Wt 50 kg   SpO2 93%   BMI 20.16 kg/m  SpO2: SpO2: 93 % O2 Device: O2 Device: Nasal Cannula O2 Flow Rate: O2 Flow Rate (L/min): 5 L/min  Palliative Care Assessment & Plan    Goals of Care/Recommendations:  DNR/DNI  Continue to treat the treatable  Family remaining hopeful for stability and patient's return back to SNF with palliative support. Family has been clear if patient's health was to further decline they are focused on her quality of life and would be open to a more comfort focused of care when appropriate.   Patient is established with outpatient Palliative at [facility].   PMT will continue to support and follow as needed. Please call team line with urgent needs.   Prognosis:  Guarded  Discharge Planning: SNF with Palliative   Thank you for allowing the Palliative Medicine Team to assist in the care of this patient.  Time Total: 25 min.   Visit consisted of counseling and education dealing with the complex and emotionally intense issues of symptom management and palliative care in the setting of serious and potentially life-threatening illness.Greater than 50%  of this time was spent counseling and coordinating care related to the above assessment and plan.  Alda Lea, AGPCNP-BC  Palliative Medicine Team [phone]

## 2020-09-21 NOTE — Progress Notes (Signed)
PROGRESS NOTE    Patient: [name]                            PCP: Reynold Bowen, MD                    DOB: 04/03/1936            DOA: 09/19/2020 LAG:[id number]             DOS: 09/21/2020, 12:24 PM   LOS: 2 days   Date of Service: The patient was seen and examined on 09/21/2020  Subjective:   The patient was seen and examined this morning, in mild-moderate acute distress with shortness of breath, satting 95% on 5 L of oxygen, tachypneic with respirate of 21, tachycardic blood pressure elevated 176/98. Patient was found awake alert, was following commands   Per nursing staff she had tachycardia overnight with heart rate as high as 121 --- EKG reviewed atrial fibrillation Normal sinus rhythm this morning but tachycardic..  (This morning her vital stabilized with 5 mg IV metoprolol, repleting potassium, magnesium, Lasix)   Brief Narrative:   [name] is a 85 y.o. female with medical history significant of cerebral palsy, depression, GERD. Presenting with N/V. History is from chart review as patient is a poor historian. Apparently she has been vomiting ?  feculent material y. Her SNF had concern that she had a UTI, so they started treating her for that with an unspecified abx.  ED Course: Febrile (103+), tachycardic, and tachypneic. She was also hypotensive. She was given 3.5L in fluid boluses, but her BP remained low. She was going to start pressors, but her BP finally responded.  Her CXR was concerning for PNA. Her UA was concerning for UTI. She was started on sepsis protocol.  She was reviewed by PCCM. CT abdomen pelvis and chest was reviewed consistent with multifocal pneumonia, mainly on right side, significant hiatal hernia, mediastinal lymphadenopathy  Assessment & Plan:   Active Problems:   Sepsis ([place])   Severe sepsis likely secondary to aspiration PNA and UTI     -Likely source of infection multifocal pneumonia, UTI -On admission met severe sepsis criteria  being febrile tachycardic, tachypneic, leukocytosis, lactic acidosis, hypotension -Sepsis protocol was followed broad-spectrum antibiotic, aggressive IV fluid resuscitation -Sepsis physiology has improved ... Still tachycardic, tachypneic, blood pressure is improved -Worsening shortness of breath, with crackles due to aggressive IV fluid, will be discontinued, as needed Lasix -Continue broad-spectrum antibiotics, -Blood/urine culture -no growth to date Urine culture revealing multiple species present recollection was suggested we will reculture today 09/21/2020  -Questionable materials with nausea vomiting patient has not had an episode since admission, CT abdomen pelvis and chest was reviewed in detail -significant finding for multifocal pneumonia specifically on the right, questionable aspiration, mediastinal lymphadenopathy, severe hiatal hernia -Trending lactic acid 3.2, 1.4  today,      Procalcitonin 1.47,  Severe hypotension -history of hypertension -Hypotension resolved, patient is now hypertensive -Hypotension likely due to sepsis physiology -resolved -We will start the patient on metoprolol, and as needed hydralazine  Nausea /vomiting -No further episodes questionable aspiration based on CT findings -?  Solid matter with nausea vomiting likely due to gastric content,  CT chest abdomen, pelvis finding consistent with severe hiatal hernia, 75% of stomach in the chest -Tolerating p.o. will advance as tolerated  ? New onset paroxysmal atrial fibrillation -We will monitor closely -Onset likely due to  sepsis , severe electro abnormality such as hypokalemia, hypomagnesemia --  with 1 dose of IV metoprolol heart rate has improved to 58, converted to normal sinus -Chads vas 2 score is 3 ... Withholding chronic anticoagulation at this time (1 brief episode) If persist or her scores changes we will consider chronic anticoagulation -We will initiate aspirin   Hypokalemia     - given 40 IV  K+ in ED, rpt lab, check Mg2+; replace as necessary -Monitoring very closely, -Hyperkalemia this morning potassium 2.7, >>> will be repleted IV and p.o.,  Cerebral palsy / Contractures lower extremities     - continue antispasmodics as able  -At baseline, no changes  HLD     -We will continue statin  GERD     -We will continue protonix, carafate -We will continue Protonix IV today  DVT prophylaxis: SCDs  Code Status: DNR  Family Communication: None at bedside.  Consults called:  Palliative care, PCCM Status is: Inpatient  Remains inpatient appropriate because:Inpatient level of care appropriate due to severity of illness. She will remain in stepdown unit, continue to be in respiratory distress requiring 5 L of oxygen, tachycardic, tachypneic ....   Dispo: The patient is from: SNF  Anticipated d/c is to: SNF  Anticipated d/c date is: 3 days  Patient currently is not medically stable to d/c.              Difficult to place patient No        ----------------------------------------------------------------------------------------------------------------------------------------------- Nutritional status:  The patient's BMI is: Body mass index is 20.16 kg/m. I agree with the assessment and plan as outlined ...  ---------------------------------------------------------------------------------------------------------------------------------------------------- Cultures; Blood Cultures x 2 >> no growth to date Urine Culture  >>> indeterminant, reculturing urine September 21, 2020   Antimicrobials: 09/19/2020 IV Rocephin, azithromycin, Flagyl >>    Consultants: PCCM   Family Communication:  No family member present at bedside- attempt will be made to update daily Level of care: Stepdown   Procedures:   No admission procedures for hospital encounter.     Antimicrobials:  Anti-infectives (From admission, onward)   Start      Dose/Rate Route Frequency Ordered Stop   09/20/20 0400  cefTRIAXone (ROCEPHIN) 2 g in sodium chloride 0.9 % 100 mL IVPB        2 g 200 mL/hr over 30 Minutes Intravenous Every 24 hours 09/19/20 1801     09/20/20 0400  azithromycin (ZITHROMAX) 500 mg in sodium chloride 0.9 % 250 mL IVPB        500 mg 250 mL/hr over 60 Minutes Intravenous Every 24 hours 09/19/20 1801     09/19/20 1500  metroNIDAZOLE (FLAGYL) IVPB 500 mg        500 mg 100 mL/hr over 60 Minutes Intravenous Every 8 hours 09/19/20 1456     09/19/20 0330  cefTRIAXone (ROCEPHIN) 1 g in sodium chloride 0.9 % 100 mL IVPB        1 g 200 mL/hr over 30 Minutes Intravenous  Once 09/19/20 0318 09/19/20 0423   09/19/20 0330  azithromycin (ZITHROMAX) 500 mg in sodium chloride 0.9 % 250 mL IVPB        500 mg 250 mL/hr over 60 Minutes Intravenous  Once 09/19/20 0318 09/19/20 0535       Medication:   atorvastatin  10 mg Oral Daily   busPIRone  15 mg Oral TID   chlorhexidine  15 mL Mouth Rinse BID   Chlorhexidine Gluconate Cloth  6 each Topical Daily  citalopram  40 mg Oral Daily   diazepam  1 mg Oral BID   docusate  100 mg Oral Daily   fluticasone  1 spray Each Nare Daily   mouth rinse  15 mL Mouth Rinse q12n4p   OLANZapine  1.25 mg Oral QHS   pantoprazole sodium  40 mg Oral BID   polyethylene glycol  17 g Oral QHS   sucralfate  1 g Oral BID    acetaminophen **OR** acetaminophen, metoprolol tartrate, ondansetron **OR** ondansetron (ZOFRAN) IV   Objective:   Vitals:   09/21/20 0927 09/21/20 1000 09/21/20 1100 09/21/20 1200  BP: (!) 125/110 (!) 155/129 137/68   Pulse: (!) 120 72 (!) 58   Resp: (!) 27 (!) 27 (!) 21   Temp:    98.1 F (36.7 C)  TempSrc:    Oral  SpO2: 95% 95% 95%   Weight:      Height:        Intake/Output Summary (Last 24 hours) at 09/21/2020 1224 Last data filed at 09/21/2020 1057 Gross per 24 hour  Intake 1663.35 ml  Output 2150 ml  Net -486.65 ml   Filed Weights   09/19/20  1800  Weight: 50 kg     Examination:      Physical Exam:   General:  Alert, oriented, cooperative, no distress;   HEENT:  Normocephalic, PERRL, otherwise with in Normal limits   Neuro:  CNII-XII intact. , normal motor and sensation, reflexes intact   Lungs:   Clear to auscultation BL, Respirations unlabored, no wheezes / crackles  Cardio:    S1/S2, RRR, No murmure, No Rubs or Gallops   Abdomen:   Soft, non-tender, bowel sounds active all four quadrants,  no guarding or peritoneal signs.  Muscular skeletal:  Limited exam - in bed, able to move all upper extremities, Normal strength,  2+ pulses,  symmetric, No pitting edema, muscle wasting, severe weakness in lower extremities--chronic  Skin:  Dry, warm to touch, negative for any Rashes,  Wounds: Please see nursing documentation         ------------------------------------------------------------------------------------------------------------------------------------------    LABs:  CBC Latest Ref Rng & Units 09/21/2020 09/20/2020 09/19/2020  WBC 4.0 - 10.5 K/uL 11.5(H) 15.4(H) 14.7(H)  Hemoglobin 12.0 - 15.0 g/dL 10.5(L) 10.9(L) 13.5  Hematocrit 36.0 - 46.0 % 33.2(L) 35.7(L) 42.2  Platelets 150 - 400 K/uL 183 152 209   CMP Latest Ref Rng & Units 09/21/2020 09/20/2020 09/19/2020  Glucose 70 - 99 mg/dL 100(H) 87 -  BUN 8 - 23 mg/dL 7(L) 11 -  Creatinine 0.44 - 1.00 mg/dL 0.44 0.45 -  Sodium 135 - 145 mmol/L 144 141 -  Potassium 3.5 - 5.1 mmol/L 2.7(LL) 3.4(L) -  Chloride 98 - 111 mmol/L 111 111 -  CO2 22 - 32 mmol/L 23 21(L) -  Calcium 8.9 - 10.3 mg/dL 8.3(L) 7.8(L) -  Total Protein 6.5 - 8.1 g/dL 5.5(L) 5.4(L) 5.4(L)  Total Bilirubin 0.3 - 1.2 mg/dL 0.7 0.7 0.6  Alkaline Phos 38 - 126 U/L 60 56 52  AST 15 - 41 U/L 18 19 20   ALT 0 - 44 U/L 13 13 11        Micro Results Recent Results (from the past 240 hour(s))  Blood culture (routine x 2)     Status: None (Preliminary result)   Collection Time: 09/19/20  3:24 AM    Specimen: BLOOD  Result Value Ref Range Status   Specimen Description   Final    BLOOD SITE NOT  SPECIFIED Performed at [facility], [place] [address] [place], [place] [address]    Special Requests   Final    BOTTLES DRAWN AEROBIC AND ANAEROBIC Blood Culture adequate volume Performed at [facility] [address], [place], [place] [address]    Culture   Final    NO GROWTH 2 DAYS Performed at [facility] [address] [place], [place] [address]    Report Status PENDING  Incomplete  Blood culture (routine x 2)     Status: None (Preliminary result)   Collection Time: 09/19/20  3:25 AM   Specimen: BLOOD  Result Value Ref Range Status   Specimen Description   Final    BLOOD SITE NOT SPECIFIED Performed at [facility] [address], [place], [place] [address]    Special Requests   Final    BOTTLES DRAWN AEROBIC AND ANAEROBIC Blood Culture results may not be optimal due to an inadequate volume of blood received in culture bottles Performed at [facility] [address], [place], [place] [address]    Culture   Final    NO GROWTH 2 DAYS Performed at [facility] [address] [place], [place] [address]    Report Status PENDING  Incomplete  Resp Panel by RT-PCR (Flu A&B, Covid) Nasopharyngeal Swab     Status: None   Collection Time: 09/19/20  3:25 AM   Specimen: Nasopharyngeal Swab; Nasopharyngeal(NP) swabs in vial transport medium  Result Value Ref Range Status   SARS Coronavirus 2 by RT PCR NEGATIVE NEGATIVE Final    Comment: (NOTE) SARS-CoV-2 target nucleic acids are NOT DETECTED.  The SARS-CoV-2 RNA is generally detectable in upper respiratory specimens during the acute phase of infection. The lowest concentration of SARS-CoV-2 viral copies this assay can detect is 138 copies/mL. A negative result does not preclude SARS-Cov-2 infection and should not be used as the sole basis for treatment or other  patient management decisions. A negative result may occur with  improper specimen collection/handling, submission of specimen other than nasopharyngeal swab, presence of viral mutation(s) within the areas targeted by this assay, and inadequate number of viral copies(<138 copies/mL). A negative result must be combined with clinical observations, patient history, and epidemiological information. The expected result is Negative.  Fact Sheet for Patients:  EntrepreneurPulse.com.au  Fact Sheet for Healthcare Providers:  IncredibleEmployment.be  This test is no t yet approved or cleared by the [place] FDA and  has been authorized for detection and/or diagnosis of SARS-CoV-2 by FDA under an Emergency Use Authorization (EUA). This EUA will remain  in effect (meaning this test can be used) for the duration of the COVID-19 declaration under Section 564(b)(1) of the Act, 21 U.S.C.section 360bbb-3(b)(1), unless the authorization is terminated  or revoked sooner.       Influenza A by PCR NEGATIVE NEGATIVE Final   Influenza B by PCR NEGATIVE NEGATIVE Final    Comment: (NOTE) The Xpert Xpress SARS-CoV-2/FLU/RSV plus assay is intended as an aid in the diagnosis of influenza from Nasopharyngeal swab specimens and should not be used as a sole basis for treatment. Nasal washings and aspirates are unacceptable for Xpert Xpress SARS-CoV-2/FLU/RSV testing.  Fact Sheet for Patients: EntrepreneurPulse.com.au  Fact Sheet for Healthcare Providers: IncredibleEmployment.be  This test is not yet approved or cleared by the [place] FDA and has been authorized for detection and/or diagnosis of SARS-CoV-2 by FDA under an Emergency Use Authorization (EUA). This EUA  will remain in effect (meaning this test can be used) for the duration of the COVID-19 declaration under Section 564(b)(1) of the Act, 21 U.S.C. section  360bbb-3(b)(1), unless the authorization is terminated or revoked.  Performed at [facility], [place] [address], [place], [place] [address]   Urine culture     Status: Abnormal   Collection Time: 09/19/20  5:08 AM   Specimen: Urine, Catheterized  Result Value Ref Range Status   Specimen Description   Final    URINE, CATHETERIZED Performed at [facility] [address], [place], [place] [address]    Special Requests   Final    NONE Performed at [facility], [place] [address], [place], [place] [address]    Culture MULTIPLE SPECIES PRESENT, SUGGEST RECOLLECTION (A)  Final   Report Status 09/20/2020 FINAL  Final    Radiology Reports CT CHEST WO CONTRAST  Result Date: 09/19/2020 CLINICAL DATA:  Nausea vomiting.  Sepsis. EXAM: CT CHEST, ABDOMEN, AND PELVIS WITH CONTRAST TECHNIQUE: Multidetector CT imaging of the chest, abdomen and pelvis was performed following the standard protocol during bolus administration of intravenous contrast. CONTRAST:  152m OMNIPAQUE IOHEXOL 300 MG/ML  SOLN COMPARISON:  CT chest 04/26/2020. abdomen/pelvis CT 05/10/2014. FINDINGS: CT CHEST FINDINGS Cardiovascular: Heart size upper normal. No substantial pericardial effusion. Atherosclerotic calcification is noted in the wall of the thoracic aorta. Mediastinum/Nodes: 6.9 x 5.4 cm left thyroid mass measures slightly larger than on the prior study when it was reported at 6.1 x 4.3 cm. 12 mm short axis prevascular lymph node is stable in the interval. 13 mm short axis low right paratracheal node is similar to prior no axillary lymphadenopathy. Large hiatal hernia evident with more than 75% of the stomach contained in the chest. Lungs/Pleura: Centrilobular emphsyema noted. Dense airspace consolidation noted posterior right upper lobe, right middle lobe and right lower lobe. There is consolidative airspace disease in the left lower lobe as well. Areas of irregular nodular  airspace disease in the mid right upper lobe likely related to the more diffuse process in the right lung. Tiny right pleural effusion noted. Multiple small lymph nodes in collateral vessels are seen in the right thoracic inlet. Musculoskeletal: No worrisome lytic or sclerotic osseous abnormality. CT ABDOMEN PELVIS FINDINGS Hepatobiliary: No suspicious focal abnormality within the liver parenchyma. There is no evidence for gallstones, gallbladder wall thickening, or pericholecystic fluid. No intrahepatic or extrahepatic biliary dilation. Pancreas: No focal mass lesion. No dilatation of the main duct. No intraparenchymal cyst. No peripancreatic edema. Spleen: No splenomegaly. No focal mass lesion. Adrenals/Urinary Tract: No adrenal nodule or mass. 3 cm cyst in the interpolar left kidney is new in the interval. Are too small to characterize. Additional tiny hypodensities in the left kidney right kidney unremarkable. No evidence for hydroureter. The urinary bladder appears normal for the degree of distention. Stomach/Bowel: As above, there is a large hiatal hernia. Duodenum is normally positioned as is the ligament of Treitz. No small bowel wall thickening. No small bowel dilatation. The terminal ileum is normal. The appendix is normal. No gross colonic mass. No colonic wall thickening. Vascular/Lymphatic: There is abdominal aortic atherosclerosis without aneurysm. Portal vein and superior mesenteric vein are patent. Splenic vein is patent. Celiac axis and SMA are opacified. IMA is opacified. There is no gastrohepatic or hepatoduodenal ligament lymphadenopathy. No retroperitoneal or mesenteric lymphadenopathy. No pelvic sidewall lymphadenopathy. Reproductive: The uterus is unremarkable.  There is no adnexal mass. Other: No intraperitoneal free fluid. Musculoskeletal:  Bones are diffusely demineralized. Surgical hardware noted proximal right femur IMPRESSION: 1. Dense airspace consolidation in the right upper lobe, right  middle lobe, and right lower lobe with consolidative airspace disease in the left lower lobe. Imaging features are compatible with multifocal pneumonia. Ill-defined nodular opacities in the central right upper lobe are likely related. Follow-up recommended to ensure resolution. 2. Tiny right pleural effusion. 3. Stable mild mediastinal lymphadenopathy, likely reactive. 4. 7 cm left thyroid nodule, similar to CT chest 04/26/2020. Recommend thyroid US. (Ref: J Am Coll Radiol. 2015 Feb;12(2): 143-50). 5. No acute findings in the abdomen or pelvis. 6. Large hiatal hernia with more than 75% of the stomach contained in the chest. 7. Interval development of a 3 cm cyst in the interpolar left kidney with other tiny hypodensities in the left kidney too small to characterize. 8. Aortic Atherosclerosis (ICD10-I70.0) and Emphysema (ICD10-J43.9). Electronically Signed   By: Misty Stanley M.D.   On: 09/19/2020 15:16   CT ABDOMEN PELVIS W CONTRAST  Result Date: 09/19/2020 CLINICAL DATA:  Nausea vomiting.  Sepsis. EXAM: CT CHEST, ABDOMEN, AND PELVIS WITH CONTRAST TECHNIQUE: Multidetector CT imaging of the chest, abdomen and pelvis was performed following the standard protocol during bolus administration of intravenous contrast. CONTRAST:  185m OMNIPAQUE IOHEXOL 300 MG/ML  SOLN COMPARISON:  CT chest 04/26/2020. abdomen/pelvis CT 05/10/2014. FINDINGS: CT CHEST FINDINGS Cardiovascular: Heart size upper normal. No substantial pericardial effusion. Atherosclerotic calcification is noted in the wall of the thoracic aorta. Mediastinum/Nodes: 6.9 x 5.4 cm left thyroid mass measures slightly larger than on the prior study when it was reported at 6.1 x 4.3 cm. 12 mm short axis prevascular lymph node is stable in the interval. 13 mm short axis low right paratracheal node is similar to prior no axillary lymphadenopathy. Large hiatal hernia evident with more than 75% of the stomach contained in the chest. Lungs/Pleura: Centrilobular  emphsyema noted. Dense airspace consolidation noted posterior right upper lobe, right middle lobe and right lower lobe. There is consolidative airspace disease in the left lower lobe as well. Areas of irregular nodular airspace disease in the mid right upper lobe likely related to the more diffuse process in the right lung. Tiny right pleural effusion noted. Multiple small lymph nodes in collateral vessels are seen in the right thoracic inlet. Musculoskeletal: No worrisome lytic or sclerotic osseous abnormality. CT ABDOMEN PELVIS FINDINGS Hepatobiliary: No suspicious focal abnormality within the liver parenchyma. There is no evidence for gallstones, gallbladder wall thickening, or pericholecystic fluid. No intrahepatic or extrahepatic biliary dilation. Pancreas: No focal mass lesion. No dilatation of the main duct. No intraparenchymal cyst. No peripancreatic edema. Spleen: No splenomegaly. No focal mass lesion. Adrenals/Urinary Tract: No adrenal nodule or mass. 3 cm cyst in the interpolar left kidney is new in the interval. Are too small to characterize. Additional tiny hypodensities in the left kidney right kidney unremarkable. No evidence for hydroureter. The urinary bladder appears normal for the degree of distention. Stomach/Bowel: As above, there is a large hiatal hernia. Duodenum is normally positioned as is the ligament of Treitz. No small bowel wall thickening. No small bowel dilatation. The terminal ileum is normal. The appendix is normal. No gross colonic mass. No colonic wall thickening. Vascular/Lymphatic: There is abdominal aortic atherosclerosis without aneurysm. Portal vein and superior mesenteric vein are patent. Splenic vein is patent. Celiac axis and SMA are opacified. IMA is opacified. There is no gastrohepatic or hepatoduodenal ligament lymphadenopathy. No retroperitoneal or mesenteric lymphadenopathy. No pelvic sidewall lymphadenopathy. Reproductive: The  uterus is unremarkable.  There is no  adnexal mass. Other: No intraperitoneal free fluid. Musculoskeletal: Bones are diffusely demineralized. Surgical hardware noted proximal right femur  IMPRESSION:  1. Dense airspace consolidation in the right upper lobe, right middle lobe, and right lower lobe with consolidative airspace disease in the left lower lobe. Imaging features are compatible with multifocal pneumonia. Ill-defined nodular opacities in the central right upper lobe are likely related. Follow-up recommended to ensure resolution.  2. Tiny right pleural effusion.  3. Stable mild mediastinal lymphadenopathy, likely reactive. 4. 7 cm left thyroid nodule, similar to CT chest 04/26/2020. Recommend thyroid US. (Ref: J Am Coll Radiol. 2015 Feb;12(2): 143-50). 5. No acute findings in the abdomen or pelvis. 6. Large hiatal hernia with more than 75% of the stomach contained in the chest. 7. Interval development of a 3 cm cyst in the interpolar left kidney with other tiny hypodensities in the left kidney too small to characterize. 8. Aortic Atherosclerosis (ICD10-I70.0) and Emphysema (ICD10-J43.9). Electronically Signed   By: Misty Stanley M.D.   On: 09/19/2020 15:16   DG Chest Port 1 View  Result Date: 09/19/2020 CLINICAL DATA:  Hypoxia after fluid resuscitation.  Vomiting. EXAM: PORTABLE CHEST 1 VIEW COMPARISON:  Earlier today FINDINGS: Cardiomegaly and vascular pedicle widening accentuated by rotation. Additional tracheal mass effect from a large left thyroid nodule extending into the mediastinum. Large hiatal hernia. Vascular congestion. Asymmetric indistinct density at the left base. No Kerley lines or effusion. No pneumothorax. IMPRESSION: 1. Vascular congestion. 2. Cannot exclude a left lower lobe infiltrate, certainty limited by patient rotation 3. Large hiatal hernia. Electronically Signed   By: Monte Fantasia M.D.   On: 09/19/2020 07:30   DG Chest Port 1 View  Result Date: 09/19/2020 CLINICAL DATA:  Vomiting feces since yesterday.  EXAM: PORTABLE CHEST 1 VIEW COMPARISON:  May 03, 2020 FINDINGS: The study is limited secondary to patient positioning. Stable, chronic appearing increased lung markings are noted, bilaterally. Very mild right suprahilar and bilateral infrahilar atelectasis and/or infiltrate is noted. There is no evidence of a pleural effusion or pneumothorax. Stable enlargement of the cardiac silhouette is seen. Multilevel degenerative changes are noted throughout the thoracic spine. IMPRESSION: Mild right suprahilar and bilateral infrahilar atelectasis and/or infiltrate. Electronically Signed   By: Virgina Norfolk M.D.   On: 09/19/2020 02:52    SIGNED: Deatra James, MD, FHM. [facility],  Pager (please use [email or web address] to page/text) Please use Epic Secure Chat for non-urgent communication (7AM-7PM)  If 7PM-7AM, please contact night-coverage [email or web address], 09/21/2020, 12:24 PM

## 2020-09-22 LAB — COMPREHENSIVE METABOLIC PANEL
ALT: 13 U/L (ref 0–44)
AST: 15 U/L (ref 15–41)
Albumin: 2.8 g/dL — ABNORMAL LOW (ref 3.5–5.0)
Alkaline Phosphatase: 60 U/L (ref 38–126)
Anion gap: 11 (ref 5–15)
BUN: <5 mg/dL — ABNORMAL LOW (ref 8–23)
CO2: 27 mmol/L (ref 22–32)
Calcium: 8.2 mg/dL — ABNORMAL LOW (ref 8.9–10.3)
Chloride: 104 mmol/L (ref 98–111)
Creatinine, Ser: 0.36 mg/dL — ABNORMAL LOW (ref 0.44–1.00)
GFR, Estimated: >60 mL/min (ref >60)
Glucose, Bld: 102 mg/dL — ABNORMAL HIGH (ref 70–99)
Potassium: 3 mmol/L — ABNORMAL LOW (ref 3.5–5.1)
Sodium: 142 mmol/L (ref 135–145)
Total Bilirubin: 0.8 mg/dL (ref 0.3–1.2)
Total Protein: 5.8 g/dL — ABNORMAL LOW (ref 6.5–8.1)

## 2020-09-22 LAB — CBC
HCT: 36.6 % (ref 36.0–46.0)
Hemoglobin: 11.6 g/dL — ABNORMAL LOW (ref 12.0–15.0)
MCH: 28.6 pg (ref 26.0–34.0)
MCHC: 31.7 g/dL (ref 30.0–36.0)
MCV: 90.1 fL (ref 80.0–100.0)
Platelets: 211 10*3/uL (ref 150–400)
RBC: 4.06 MIL/uL (ref 3.87–5.11)
RDW: 15.7 % — ABNORMAL HIGH (ref 11.5–15.5)
WBC: 9.2 10*3/uL (ref 4.0–10.5)
nRBC: 0 % (ref 0.0–0.2)

## 2020-09-22 LAB — URINE CULTURE: Culture: <10000 — AB

## 2020-09-22 MED ORDER — CITALOPRAM HYDROBROMIDE 20 MG PO TABS
20.0000 mg | ORAL_TABLET | Freq: Every day | ORAL | Status: DC
  Administered 2020-09-22 – 2020-09-25 (×4): 20 mg via ORAL
  Filled 2020-09-22 (×4): qty 1

## 2020-09-22 MED ORDER — POTASSIUM CHLORIDE CRYS ER 20 MEQ PO TBCR
40.0000 meq | EXTENDED_RELEASE_TABLET | Freq: Once | ORAL | Status: AC
  Administered 2020-09-22: 40 meq via ORAL
  Filled 2020-09-22: qty 2

## 2020-09-22 NOTE — Progress Notes (Signed)
SLP Cancellation Note  Patient Details Name: [name] MRN: [id number] DOB: [birth year]   Cancelled treatment:       Reason Eval/Treat Not Completed: Other (comment);Fatigue/lethargy limiting ability to participate RN reports pt lethargic this am, Will continue efforts.   Kathleen Lime, MS [facility] SLP Acute Rehab Services Office [phone] Pager [phone]    [name] 09/22/2020, 9:05 AM

## 2020-09-22 NOTE — Progress Notes (Signed)
PROGRESS NOTE    [name]  WCB:[id number] DOB: 1936/04/22 DOA: 09/19/2020 PCP: Reynold Bowen, MD     Brief Narrative:  [name] is an 85 y.o.femalewith medical history significant ofcerebral palsy, depression, GERD, dementia who presented with N/V. She had reportedly been vomiting feculent material lately. SNF had concern that she had a UTI, so they started treating her for that with antibiotic. On arrival to the ED, she was found to be febrile, tachycardic, tachypneic as well as hypotensive. She was given IVF. CXR was concerned for pneumonia and UA concerning for UTI.   New events last 24 hours / Subjective: Patient alert and oriented to self and hospital but not to year.  She voices no new complaints.  Remains on 5 L nasal cannula O2 this morning.  Assessment & Plan:   Active Problems:   Sepsis ([place])   Severe sepsis secondary to aspiration pneumonia and UTI -Patient found to be febrile, tachycardic, tachypneic with leukocytosis, lactic acidosis with hypotension at the time of admission -UA positive for UTI but urine culture negative -Blood cultures negative  -CT C/A/P: Dense airspace consolidation in the right upper lobe, right middle lobe, and right lower lobe with consolidative airspace disease in the left lower lobe. Imaging features are compatible with multifocal pneumonia. Ill-defined nodular opacities in the central right upper lobe are likely related. Follow-up recommended to ensure resolution. No acute findings in the abdomen or pelvis. Large hiatal hernia with more than 75% of the stomach contained in the chest. -WBC improving -Continue Rocephin, azithromycin, Flagyl -Remains on 5L O2, wean as able   Essential hypertension -Hypotension has now resolved, resume home metoprolol  New onset paroxysmal atrial fibrillation -Now in normal sinus rhythm -CHA2DS2-VASc score 3, due to paroxysmal nature of A. fib with her chronic debility, she was started on aspirin  only.  Can consider advancing to full anticoagulation if A. fib persists  Hypokalemia -Replace, trend  Cerebral palsy -Supportive care  Hyperlipidemia -Continue lipitor   Anxiety -Continue buspar, celexa at lower dosage as rec by pharmacy, valium, zyprexa   Thyroid nodule -7 cm left thyroid nodule, similar to CT chest 04/26/2020. Recommend thyroid US as outpatient      DVT prophylaxis:  SCDs Start: 09/19/20 1802  Code Status: DNR Family Communication: None at bedside, updated family Shanon Brow over the phone  Disposition Plan:  Status is: Inpatient  Remains inpatient appropriate because:IV treatments appropriate due to intensity of illness or inability to take PO   Dispo: The patient is from: SNF              Anticipated d/c is to: SNF              Anticipated d/c date is: 2 days              Patient currently is not medically stable to d/c.   Difficult to place patient No    Antimicrobials:  Anti-infectives (From admission, onward)   Start     Dose/Rate Route Frequency Ordered Stop   09/20/20 0400  cefTRIAXone (ROCEPHIN) 2 g in sodium chloride 0.9 % 100 mL IVPB        2 g 200 mL/hr over 30 Minutes Intravenous Every 24 hours 09/19/20 1801     09/20/20 0400  azithromycin (ZITHROMAX) 500 mg in sodium chloride 0.9 % 250 mL IVPB        500 mg 250 mL/hr over 60 Minutes Intravenous Every 24 hours 09/19/20 1801  09/19/20 1500  metroNIDAZOLE (FLAGYL) IVPB 500 mg        500 mg 100 mL/hr over 60 Minutes Intravenous Every 8 hours 09/19/20 1456     09/19/20 0330  cefTRIAXone (ROCEPHIN) 1 g in sodium chloride 0.9 % 100 mL IVPB        1 g 200 mL/hr over 30 Minutes Intravenous  Once 09/19/20 0318 09/19/20 0423   09/19/20 0330  azithromycin (ZITHROMAX) 500 mg in sodium chloride 0.9 % 250 mL IVPB        500 mg 250 mL/hr over 60 Minutes Intravenous  Once 09/19/20 0318 09/19/20 0535        Objective: Vitals:   09/22/20 0700 09/22/20 0800 09/22/20 0900 09/22/20 1000  BP:  129/64 135/74 140/68 (!) 135/113  Pulse: 70 68 68 84  Resp: (!) 22 20 13  (!) 26  Temp:  98.5 F (36.9 C)    TempSrc:  Axillary    SpO2: 92% 92% 95% 91%  Weight:      Height:        Intake/Output Summary (Last 24 hours) at 09/22/2020 1136 Last data filed at 09/22/2020 0550 Gross per 24 hour  Intake 449.94 ml  Output 2400 ml  Net -1950.06 ml   Filed Weights   09/19/20 1800  Weight: 50 kg    Examination:  General exam: Appears calm and comfortable  Respiratory system: Clear to auscultation anteriorly, without respiratory distress, on 5 L oxygen Cardiovascular system: S1 & S2 heard, RRR. No murmurs. No pedal edema. Gastrointestinal system: Abdomen is nondistended, soft and nontender. Normal bowel sounds heard. Central nervous system: Alert and oriented to self and hospital only Extremities: Contractures, chronic Skin: No rashes, lesions or ulcers on exposed skin   Data Reviewed: I have personally reviewed following labs and imaging studies  CBC: Recent Labs  Lab 09/19/20 0206 09/20/20 0220 09/21/20 0252 09/22/20 0240  WBC 14.7* 15.4* 11.5* 9.2  NEUTROABS 13.5*  --   --   --   HGB 13.5 10.9* 10.5* 11.6*  HCT 42.2 35.7* 33.2* 36.6  MCV 89.8 93.5 90.7 90.1  PLT 209 152 183 517   Basic Metabolic Panel: Recent Labs  Lab 09/19/20 0206 09/19/20 1450 09/20/20 0220 09/21/20 0252 09/22/20 0240  NA 138 141 141 144 142  K 3.0* 3.8 3.4* 2.7* 3.0*  CL 104 112* 111 111 104  CO2 22 23 21* 23 27  GLUCOSE 126* 90 87 100* 102*  BUN 16 12 11  7* <5*  CREATININE 0.56 0.49 0.45 0.44 0.36*  CALCIUM 8.3* 7.4* 7.8* 8.3* 8.2*  MG  --  1.6*  --  1.6*  --    GFR: Estimated Creatinine Clearance: 42.8 mL/min (A) (by C-G formula based on SCr of 0.36 mg/dL (L)). Liver Function Tests: Recent Labs  Lab 09/19/20 1450 09/19/20 1630 09/20/20 0220 09/21/20 0252 09/22/20 0240  AST 21 20 19 18 15   ALT 13 11 13 13 13   ALKPHOS 53 52 56 60 60  BILITOT 0.6 0.6 0.7 0.7 0.8  PROT 5.0* 5.4*  5.4* 5.5* 5.8*  ALBUMIN 2.3* 2.4* 2.5* 2.6* 2.8*   Recent Labs  Lab 09/19/20 1630  LIPASE 21   No results for input(s): AMMONIA in the last 168 hours. Coagulation Profile: Recent Labs  Lab 09/20/20 0220  INR 1.3*   Cardiac Enzymes: No results for input(s): CKTOTAL, CKMB, CKMBINDEX, TROPONINI in the last 168 hours. BNP (last 3 results) No results for input(s): PROBNP in the last 8760 hours. HbA1C: No  results for input(s): HGBA1C in the last 72 hours. CBG: No results for input(s): GLUCAP in the last 168 hours. Lipid Profile: No results for input(s): CHOL, HDL, LDLCALC, TRIG, CHOLHDL, LDLDIRECT in the last 72 hours. Thyroid Function Tests: No results for input(s): TSH, T4TOTAL, FREET4, T3FREE, THYROIDAB in the last 72 hours. Anemia Panel: No results for input(s): VITAMINB12, FOLATE, FERRITIN, TIBC, IRON, RETICCTPCT in the last 72 hours. Sepsis Labs: Recent Labs  Lab 09/19/20 0205 09/19/20 0345 09/19/20 1630 09/20/20 0220  PROCALCITON  --   --   --  1.47  LATICACIDVEN 2.2* 3.2* 1.4  --     Recent Results (from the past 240 hour(s))  Blood culture (routine x 2)     Status: None (Preliminary result)   Collection Time: 09/19/20  3:24 AM   Specimen: BLOOD  Result Value Ref Range Status   Specimen Description   Final    BLOOD SITE NOT SPECIFIED Performed at [facility], [place] [address], [place], [place] [address]    Special Requests   Final    BOTTLES DRAWN AEROBIC AND ANAEROBIC Blood Culture adequate volume Performed at [facility] [address] [place], [place] [address]    Culture   Final    NO GROWTH 3 DAYS Performed at [facility], [place] [address] [place], [place] [address]    Report Status PENDING  Incomplete  Blood culture (routine x 2)     Status: None (Preliminary result)   Collection Time: 09/19/20  3:25 AM   Specimen: BLOOD  Result Value Ref Range Status   Specimen Description   Final    BLOOD SITE NOT  SPECIFIED Performed at [facility] [address] [place], [place] [address]    Special Requests   Final    BOTTLES DRAWN AEROBIC AND ANAEROBIC Blood Culture results may not be optimal due to an inadequate volume of blood received in culture bottles Performed at [facility] [address], [place], [place] [address]    Culture   Final    NO GROWTH 3 DAYS Performed at [facility], [place] [address], [place], [place] [address]    Report Status PENDING  Incomplete  Resp Panel by RT-PCR (Flu A&B, Covid) Nasopharyngeal Swab     Status: None   Collection Time: 09/19/20  3:25 AM   Specimen: Nasopharyngeal Swab; Nasopharyngeal(NP) swabs in vial transport medium  Result Value Ref Range Status   SARS Coronavirus 2 by RT PCR NEGATIVE NEGATIVE Final    Comment: (NOTE) SARS-CoV-2 target nucleic acids are NOT DETECTED.  The SARS-CoV-2 RNA is generally detectable in upper respiratory specimens during the acute phase of infection. The lowest concentration of SARS-CoV-2 viral copies this assay can detect is 138 copies/mL. A negative result does not preclude SARS-Cov-2 infection and should not be used as the sole basis for treatment or other patient management decisions. A negative result may occur with  improper specimen collection/handling, submission of specimen other than nasopharyngeal swab, presence of viral mutation(s) within the areas targeted by this assay, and inadequate number of viral copies(<138 copies/mL). A negative result must be combined with clinical observations, patient history, and epidemiological information. The expected result is Negative.  Fact Sheet for Patients:  EntrepreneurPulse.com.au  Fact Sheet for Healthcare Providers:  IncredibleEmployment.be  This test is no t yet approved or cleared by the [place] FDA and  has been authorized for detection and/or diagnosis of SARS-CoV-2 by FDA  under  an Emergency Use Authorization (EUA). This EUA will remain  in effect (meaning this test can be used) for the duration of the COVID-19 declaration under Section 564(b)(1) of the Act, 21 U.S.C.section 360bbb-3(b)(1), unless the authorization is terminated  or revoked sooner.       Influenza A by PCR NEGATIVE NEGATIVE Final   Influenza B by PCR NEGATIVE NEGATIVE Final    Comment: (NOTE) The Xpert Xpress SARS-CoV-2/FLU/RSV plus assay is intended as an aid in the diagnosis of influenza from Nasopharyngeal swab specimens and should not be used as a sole basis for treatment. Nasal washings and aspirates are unacceptable for Xpert Xpress SARS-CoV-2/FLU/RSV testing.  Fact Sheet for Patients: EntrepreneurPulse.com.au  Fact Sheet for Healthcare Providers: IncredibleEmployment.be  This test is not yet approved or cleared by the [place] FDA and has been authorized for detection and/or diagnosis of SARS-CoV-2 by FDA under an Emergency Use Authorization (EUA). This EUA will remain in effect (meaning this test can be used) for the duration of the COVID-19 declaration under Section 564(b)(1) of the Act, 21 U.S.C. section 360bbb-3(b)(1), unless the authorization is terminated or revoked.  Performed at [facility], [place] [address] [place], [place] [address]   Urine culture     Status: Abnormal   Collection Time: 09/19/20  5:08 AM   Specimen: Urine, Catheterized  Result Value Ref Range Status   Specimen Description   Final    URINE, CATHETERIZED Performed at [facility] [address], [place], [place] [address]    Special Requests   Final    NONE Performed at [facility], [place] [address] [place], [place] [address]    Culture MULTIPLE SPECIES PRESENT, SUGGEST RECOLLECTION (A)  Final   Report Status 09/20/2020 FINAL  Final  Culture, Urine     Status: Abnormal   Collection Time: 09/21/20   9:23 AM   Specimen: Urine, Clean Catch  Result Value Ref Range Status   Specimen Description   Final    URINE, CLEAN CATCH Performed at [facility], [place] [address], [place], [place] [address]    Special Requests   Final    NONE Performed at [facility], [place] [address], [place], [place] [address]    Culture (A)  Final    <10,000 COLONIES/mL INSIGNIFICANT GROWTH Performed at [facility] [place] [address] [place], [place] [address]    Report Status 09/22/2020 FINAL  Final      Radiology Studies: DG CHEST PORT 1 VIEW  Result Date: 09/21/2020 CLINICAL DATA:  Nausea and vomiting.  Cerebral palsy. EXAM: PORTABLE CHEST 1 VIEW COMPARISON:  Chest x-ray 09/19/2020. FINDINGS: Cardiomegaly. Mild bilateral pulmonary infiltrates, right side greater than left. Small bilateral pleural effusions. A component CHF may be present. Bilateral pneumonia cannot be excluded. Low lung volumes. No pneumothorax. Prominent hiatal hernia again noted. IMPRESSION: 1. Cardiomegaly with mild bilateral pulmonary infiltrates, right side greater than left. Small bilateral pleural effusions. A component of CHF may be present. Bilateral pneumonia cannot be excluded. Low lung volumes. 2.  Prominent hiatal hernia again noted. Electronically Signed   By: Orin   On: 09/21/2020 11:01      Scheduled Meds: . aspirin EC  81 mg Oral Daily  . atorvastatin  10 mg Oral Daily  . busPIRone  15 mg Oral TID  . chlorhexidine  15 mL Mouth Rinse BID  . Chlorhexidine Gluconate Cloth  6 each Topical Daily  .  citalopram  20 mg Oral Daily  . diazepam  1 mg Oral BID  . docusate  100 mg Oral Daily  . fluticasone  1 spray Each Nare Daily  . mouth rinse  15 mL Mouth Rinse q12n4p  . metoprolol tartrate  25 mg Oral BID  . OLANZapine  1.25 mg Oral QHS  . pantoprazole sodium  40 mg Oral Daily  . polyethylene glycol  17 g Oral QHS  . sucralfate  1 g Oral BID   Continuous Infusions: .  sodium chloride    . azithromycin Stopped (09/22/20 0442)  . cefTRIAXone (ROCEPHIN)  IV Stopped (09/22/20 0335)  . metronidazole Stopped (09/22/20 0704)     LOS: 3 days      Time spent: 25 minutes   Dessa Phi, DO [facility] 09/22/2020, 11:36 AM   Available via Epic secure chat 7am-7pm After these hours, please refer to coverage provider listed on [email or web address]

## 2020-09-23 LAB — BASIC METABOLIC PANEL
Anion gap: 10 (ref 5–15)
BUN: <5 mg/dL — ABNORMAL LOW (ref 8–23)
CO2: 24 mmol/L (ref 22–32)
Calcium: 8.4 mg/dL — ABNORMAL LOW (ref 8.9–10.3)
Chloride: 106 mmol/L (ref 98–111)
Creatinine, Ser: 0.37 mg/dL — ABNORMAL LOW (ref 0.44–1.00)
GFR, Estimated: >60 mL/min (ref >60)
Glucose, Bld: 104 mg/dL — ABNORMAL HIGH (ref 70–99)
Potassium: 3.4 mmol/L — ABNORMAL LOW (ref 3.5–5.1)
Sodium: 140 mmol/L (ref 135–145)

## 2020-09-23 LAB — MAGNESIUM: Magnesium: 1.9 mg/dL (ref 1.7–2.4)

## 2020-09-23 MED ORDER — AMOXICILLIN-POT CLAVULANATE 875-125 MG PO TABS
1.0000 | ORAL_TABLET | Freq: Two times a day (BID) | ORAL | Status: DC
  Administered 2020-09-23 – 2020-09-25 (×4): 1 via ORAL
  Filled 2020-09-23 (×5): qty 1

## 2020-09-23 MED ORDER — POTASSIUM CHLORIDE CRYS ER 20 MEQ PO TBCR
40.0000 meq | EXTENDED_RELEASE_TABLET | Freq: Once | ORAL | Status: AC
  Administered 2020-09-23: 40 meq via ORAL
  Filled 2020-09-23: qty 2

## 2020-09-23 NOTE — Care Management Important Message (Signed)
Important Message  Patient Details IM Letter given to the Patient. Name: [name] MRN: [id number] Date of Birth: 1936/05/05   Medicare Important Message Given:  Yes     Kerin Salen 09/23/2020, 10:28 AM

## 2020-09-23 NOTE — Progress Notes (Signed)
PROGRESS NOTE    [name]  LOV:[id number] DOB: 08-27-1935 DOA: 09/19/2020 PCP: Reynold Bowen, MD     Brief Narrative:  [name] is an 85 y.o.femalewith medical history significant ofcerebral palsy, depression, GERD, dementia who presented with N/V. She had reportedly been vomiting feculent material lately. SNF had concern that she had a UTI, so they started treating her for that with antibiotic. On arrival to the ED, she was found to be febrile, tachycardic, tachypneic as well as hypotensive. She was given IVF. CXR was concerned for pneumonia and UA concerning for UTI.   New events last 24 hours / Subjective: Patient is easily arousable this morning, has no complaints of shortness of breath or any pain.  Down to 4 L oxygen this morning  Assessment & Plan:   Active Problems:   Sepsis ([place])   Severe sepsis secondary to aspiration pneumonia and UTI -Patient found to be febrile, tachycardic, tachypneic with leukocytosis, lactic acidosis with hypotension at the time of admission -UA positive for UTI but urine culture negative -Blood cultures negative  -CT C/A/P: Dense airspace consolidation in the right upper lobe, right middle lobe, and right lower lobe with consolidative airspace disease in the left lower lobe. Imaging features are compatible with multifocal pneumonia. Ill-defined nodular opacities in the central right upper lobe are likely related. Follow-up recommended to ensure resolution. No acute findings in the abdomen or pelvis. Large hiatal hernia with more than 75% of the stomach contained in the chest. -Leukocytosis resolved -Rocephin, azithromycin, Flagyl --> Augmentin -Remains on 4L O2, wean as able   Essential hypertension -Hypotension has now resolved, resume home metoprolol  New onset paroxysmal atrial fibrillation -Now in normal sinus rhythm -CHA2DS2-VASc score 3, due to paroxysmal nature of A. fib with her chronic debility, she was started on aspirin  only.  Can consider advancing to full anticoagulation if A. fib persists  Hypokalemia -Replace, trend  Cerebral palsy -Supportive care  Hyperlipidemia -Continue lipitor   Anxiety -Continue buspar, celexa at lower dosage as rec by pharmacy, valium, zyprexa   Thyroid nodule -7 cm left thyroid nodule, similar to CT chest 04/26/2020. Recommend thyroid US as outpatient      DVT prophylaxis:  SCDs Start: 09/19/20 1802  Code Status: DNR Family Communication: None at bedside, updated family Shanon Brow over the phone 2/23 Disposition Plan:  Status is: Inpatient  Remains inpatient appropriate because:Inpatient level of care appropriate due to severity of illness   Dispo: The patient is from: SNF              Anticipated d/c is to: SNF              Anticipated d/c date is: 2 days              Patient currently is not medically stable to d/c.  Continue to wean oxygen as able.  SLP evaluation today   Difficult to place patient No    Antimicrobials:  Anti-infectives (From admission, onward)   Start     Dose/Rate Route Frequency Ordered Stop   09/23/20 1800  amoxicillin-clavulanate (AUGMENTIN) 875-125 MG per tablet 1 tablet        1 tablet Oral Every 12 hours 09/23/20 0932 09/26/20 0959   09/20/20 0400  cefTRIAXone (ROCEPHIN) 2 g in sodium chloride 0.9 % 100 mL IVPB  Status:  Discontinued        2 g 200 mL/hr over 30 Minutes Intravenous Every 24 hours 09/19/20 1801 09/23/20 0931  09/20/20 0400  azithromycin (ZITHROMAX) 500 mg in sodium chloride 0.9 % 250 mL IVPB  Status:  Discontinued        500 mg 250 mL/hr over 60 Minutes Intravenous Every 24 hours 09/19/20 1801 09/23/20 0932   09/19/20 1500  metroNIDAZOLE (FLAGYL) IVPB 500 mg  Status:  Discontinued        500 mg 100 mL/hr over 60 Minutes Intravenous Every 8 hours 09/19/20 1456 09/23/20 0932   09/19/20 0330  cefTRIAXone (ROCEPHIN) 1 g in sodium chloride 0.9 % 100 mL IVPB        1 g 200 mL/hr over 30 Minutes Intravenous  Once  09/19/20 0318 09/19/20 0423   09/19/20 0330  azithromycin (ZITHROMAX) 500 mg in sodium chloride 0.9 % 250 mL IVPB        500 mg 250 mL/hr over 60 Minutes Intravenous  Once 09/19/20 0318 09/19/20 0535       Objective: Vitals:   09/22/20 1300 09/22/20 1507 09/22/20 2330 09/23/20 0531  BP: (!) 157/85 (!) 148/81 (!) 160/88 (!) 144/77  Pulse: 65 70 62 64  Resp: (!) 22 (!) 22 20 19   Temp:  98.2 F (36.8 C) 98 F (36.7 C) 98.2 F (36.8 C)  TempSrc:   Oral Oral  SpO2: 97% 95% 93% 91%  Weight:      Height:        Intake/Output Summary (Last 24 hours) at 09/23/2020 1158 Last data filed at 09/23/2020 7939 Gross per 24 hour  Intake 1498.83 ml  Output 2150 ml  Net -651.17 ml   Filed Weights   09/19/20 1800  Weight: 50 kg    Examination: General exam: Appears calm and comfortable  Respiratory system: Clear to auscultation anteriorly. Respiratory effort normal. Cardiovascular system: S1 & S2 heard, RRR. No pedal edema. Gastrointestinal system: Abdomen is nondistended, soft and nontender. Normal bowel sounds heard. Central nervous system: Alert and oriented. Non focal exam. Speech clear    Data Reviewed: I have personally reviewed following labs and imaging studies  CBC: Recent Labs  Lab 09/19/20 0206 09/20/20 0220 09/21/20 0252 09/22/20 0240  WBC 14.7* 15.4* 11.5* 9.2  NEUTROABS 13.5*  --   --   --   HGB 13.5 10.9* 10.5* 11.6*  HCT 42.2 35.7* 33.2* 36.6  MCV 89.8 93.5 90.7 90.1  PLT 209 152 183 030   Basic Metabolic Panel: Recent Labs  Lab 09/19/20 1450 09/20/20 0220 09/21/20 0252 09/22/20 0240 09/23/20 0348  NA 141 141 144 142 140  K 3.8 3.4* 2.7* 3.0* 3.4*  CL 112* 111 111 104 106  CO2 23 21* 23 27 24   GLUCOSE 90 87 100* 102* 104*  BUN 12 11 7* <5* <5*  CREATININE 0.49 0.45 0.44 0.36* 0.37*  CALCIUM 7.4* 7.8* 8.3* 8.2* 8.4*  MG 1.6*  --  1.6*  --  1.9   GFR: Estimated Creatinine Clearance: 42.8 mL/min (A) (by C-G formula based on SCr of 0.37 mg/dL  (L)). Liver Function Tests: Recent Labs  Lab 09/19/20 1450 09/19/20 1630 09/20/20 0220 09/21/20 0252 09/22/20 0240  AST 21 20 19 18 15   ALT 13 11 13 13 13   ALKPHOS 53 52 56 60 60  BILITOT 0.6 0.6 0.7 0.7 0.8  PROT 5.0* 5.4* 5.4* 5.5* 5.8*  ALBUMIN 2.3* 2.4* 2.5* 2.6* 2.8*   Recent Labs  Lab 09/19/20 1630  LIPASE 21   No results for input(s): AMMONIA in the last 168 hours. Coagulation Profile: Recent Labs  Lab 09/20/20 0220  INR 1.3*   Cardiac Enzymes: No results for input(s): CKTOTAL, CKMB, CKMBINDEX, TROPONINI in the last 168 hours. BNP (last 3 results) No results for input(s): PROBNP in the last 8760 hours. HbA1C: No results for input(s): HGBA1C in the last 72 hours. CBG: No results for input(s): GLUCAP in the last 168 hours. Lipid Profile: No results for input(s): CHOL, HDL, LDLCALC, TRIG, CHOLHDL, LDLDIRECT in the last 72 hours. Thyroid Function Tests: No results for input(s): TSH, T4TOTAL, FREET4, T3FREE, THYROIDAB in the last 72 hours. Anemia Panel: No results for input(s): VITAMINB12, FOLATE, FERRITIN, TIBC, IRON, RETICCTPCT in the last 72 hours. Sepsis Labs: Recent Labs  Lab 09/19/20 0205 09/19/20 0345 09/19/20 1630 09/20/20 0220  PROCALCITON  --   --   --  1.47  LATICACIDVEN 2.2* 3.2* 1.4  --     Recent Results (from the past 240 hour(s))  Blood culture (routine x 2)     Status: None (Preliminary result)   Collection Time: 09/19/20  3:24 AM   Specimen: BLOOD  Result Value Ref Range Status   Specimen Description   Final    BLOOD SITE NOT SPECIFIED Performed at [facility], [place] [address] [place], [place] [address]    Special Requests   Final    BOTTLES DRAWN AEROBIC AND ANAEROBIC Blood Culture adequate volume Performed at [place] [address] [place], [place] [address]    Culture   Final    NO GROWTH 4 DAYS Performed at [facility], [place] [address] [place], [place] [address]    Report Status  PENDING  Incomplete  Blood culture (routine x 2)     Status: None (Preliminary result)   Collection Time: 09/19/20  3:25 AM   Specimen: BLOOD  Result Value Ref Range Status   Specimen Description   Final    BLOOD SITE NOT SPECIFIED Performed at [place] [address] [place], [place] [address]    Special Requests   Final    BOTTLES DRAWN AEROBIC AND ANAEROBIC Blood Culture results may not be optimal due to an inadequate volume of blood received in culture bottles Performed at [place] [address] [place], [place] [address]    Culture   Final    NO GROWTH 4 DAYS Performed at [facility], [place] [address] [place], [place] [address]    Report Status PENDING  Incomplete  Resp Panel by RT-PCR (Flu A&B, Covid) Nasopharyngeal Swab     Status: None   Collection Time: 09/19/20  3:25 AM   Specimen: Nasopharyngeal Swab; Nasopharyngeal(NP) swabs in vial transport medium  Result Value Ref Range Status   SARS Coronavirus 2 by RT PCR NEGATIVE NEGATIVE Final    Comment: (NOTE) SARS-CoV-2 target nucleic acids are NOT DETECTED.  The SARS-CoV-2 RNA is generally detectable in upper respiratory specimens during the acute phase of infection. The lowest concentration of SARS-CoV-2 viral copies this assay can detect is 138 copies/mL. A negative result does not preclude SARS-Cov-2 infection and should not be used as the sole basis for treatment or other patient management decisions. A negative result may occur with  improper specimen collection/handling, submission of specimen other than nasopharyngeal swab, presence of viral mutation(s) within the areas targeted by this assay, and inadequate number of viral copies(<138 copies/mL). A negative result must be combined with clinical observations, patient history, and epidemiological information. The expected result is Negative.  Fact Sheet for Patients:  EntrepreneurPulse.com.au  Fact  Sheet for Healthcare Providers:  IncredibleEmployment.be  This test is no t yet approved or cleared by the [place] FDA and  has been authorized for detection and/or diagnosis of SARS-CoV-2 by FDA under an Emergency Use Authorization (EUA). This EUA will remain  in effect (meaning this test can be used) for the duration of the COVID-19 declaration under Section 564(b)(1) of the Act, 21 U.S.C.section 360bbb-3(b)(1), unless the authorization is terminated  or revoked sooner.       Influenza A by PCR NEGATIVE NEGATIVE Final   Influenza B by PCR NEGATIVE NEGATIVE Final    Comment: (NOTE) The Xpert Xpress SARS-CoV-2/FLU/RSV plus assay is intended as an aid in the diagnosis of influenza from Nasopharyngeal swab specimens and should not be used as a sole basis for treatment. Nasal washings and aspirates are unacceptable for Xpert Xpress SARS-CoV-2/FLU/RSV testing.  Fact Sheet for Patients: EntrepreneurPulse.com.au  Fact Sheet for Healthcare Providers: IncredibleEmployment.be  This test is not yet approved or cleared by the [place] FDA and has been authorized for detection and/or diagnosis of SARS-CoV-2 by FDA under an Emergency Use Authorization (EUA). This EUA will remain in effect (meaning this test can be used) for the duration of the COVID-19 declaration under Section 564(b)(1) of the Act, 21 U.S.C. section 360bbb-3(b)(1), unless the authorization is terminated or revoked.  Performed at [facility], [place] [address] [place], [place] [address]   Urine culture     Status: Abnormal   Collection Time: 09/19/20  5:08 AM   Specimen: Urine, Catheterized  Result Value Ref Range Status   Specimen Description   Final    URINE, CATHETERIZED Performed at [place] [address] [place], [place] [address]    Special Requests   Final    NONE Performed at [facility], [place] [address] [place], [place] [address]    Culture MULTIPLE SPECIES PRESENT, SUGGEST RECOLLECTION (A)  Final   Report Status 09/20/2020 FINAL  Final  Culture, Urine     Status: Abnormal   Collection Time: 09/21/20  9:23 AM   Specimen: Urine, Clean Catch  Result Value Ref Range Status   Specimen Description   Final    URINE, CLEAN CATCH Performed at [facility], [place] [address] [place], [place] [address]    Special Requests   Final    NONE Performed at [facility], [place] [address], [place], [place] [address]    Culture (A)  Final    <10,000 COLONIES/mL INSIGNIFICANT GROWTH Performed at [place] [address] [place], [place] [address]    Report Status 09/22/2020 FINAL  Final      Radiology Studies: No results found.    Scheduled Meds: . amoxicillin-clavulanate  1 tablet Oral Q12H  . aspirin EC  81 mg Oral Daily  . atorvastatin  10 mg Oral Daily  . busPIRone  15 mg Oral TID  . chlorhexidine  15 mL Mouth Rinse BID  . Chlorhexidine Gluconate Cloth  6 each Topical Daily  . citalopram  20 mg Oral Daily  . diazepam  1 mg Oral BID  . docusate  100 mg Oral Daily  . fluticasone  1 spray Each Nare Daily  . mouth rinse  15 mL Mouth Rinse q12n4p  . metoprolol tartrate  25 mg Oral BID  . OLANZapine  1.25 mg Oral QHS  . pantoprazole sodium  40 mg Oral Daily  . polyethylene glycol  17 g Oral QHS  . sucralfate  1 g Oral BID   Continuous Infusions: . sodium chloride       LOS: 4 days      Time spent: 25 minutes   Dessa Phi, DO [facility] 09/23/2020, 11:58 AM   Available via Epic secure chat 7am-7pm After these hours, please refer to coverage Doak Mah listed on [email or web address]

## 2020-09-23 NOTE — Progress Notes (Signed)
  Speech Language Pathology Treatment: Dysphagia  Patient Details Name: [name] MRN: [id number] DOB: Sep 08, 1935 Today's Date: 09/23/2020 Time: 6761-9509 SLP Time Calculation (min) (ACUTE ONLY): 30 min  Assessment / Plan / Recommendation Clinical Impression  Today pt seen to assess po advancement readiness.   She reports needing assistance to eat but reports her swallowing ability is as good as normal. Pt is on full liquid diet but reports desires to have her diet advanced.  She is having difficulty self feeding thus SLP obtained lidded up from office for her to use independently.  Pt consumed graham crackers and coffee = no indication of dysphagia or s/s of aspiration. Recommend advance diet to dys3/thin.  SlP phoned [facility] SLP at facilty with pt permission and he advised pt was on mechanical soft/thin diet and he helped assist with proper positioning for eating.  Pt takes medications with puree - crushed prior to admit.  Advised his pt likely aspirated fecal material PTA.  Informed pt to Billy's call to which she reported gratitude.  Advised her that Abe People will see her when she returns to facility.  No SlP follow up indicated at this time.  Thanks for allowing me to help with this pt's care plan.    HPI HPI: Patient is an 85 y.o. female with PMH: cerebral palsy, depression, GERD presenting from [facility] with nausea and vomitting and concern for UTI. Upon admission to ED, patient was febrile with temp above 103 degrees, low BP. CXR was concerning for PNA and UA was concerning for UTI. CT abdomen, pelvis, chest consistent with multifocal pneumonia, mainly on right side, significant hiatal hernia, mediastinal lymphadenopathy. Patient has had multiple MBS studies with most recent being October of 2021 and at that time, Dys 1 nectar thick liquids was recommended.  Pt undewent clinical swallow evaluation and was placed on full liquid diet. Follow up indicated to assess po tolerance, determine  indication for instrumental swallow evalaution and for review of dysphagia mitigation strategies.  Pt reports she is on a regular/thin diet at her facility and takes her medications with puree.      SLP Plan  All goals met       Recommendations  Diet recommendations: Dysphagia 3 (mechanical soft);Thin liquid Liquids provided via: Straw Medication Administration: Crushed with puree Supervision: Staff to assist with self feeding Compensations: Minimize environmental distractions;Slow rate;Small sips/bites Postural Changes and/or Swallow Maneuvers: Seated upright 90 degrees;Upright 30-60 min after meal (HOB now below 27* please due to large hiatal hernia)                Oral Care Recommendations: Oral care BID;Staff/trained caregiver to provide oral care Follow up Recommendations: 24 hour supervision/assistance;Skilled Nursing facility SLP Visit Diagnosis: Dysphagia, unspecified (R13.10) Plan: All goals met       GO                [name] 09/23/2020, 1:37 PM  Kathleen Lime, MS [facility] SLP Acute Rehab Services Office [phone] Pager [phone]

## 2020-09-23 NOTE — Care Management Important Message (Signed)
Important Message  Patient Details Mailed copy of IM Letter to Patient's home. Name: [name] MRN: [id number] Date of Birth: [birth year]   Medicare Important Message Given:  Yes     Kerin Salen 09/23/2020, 10:35 AM

## 2020-09-24 ENCOUNTER — Encounter (HOSPITAL_COMMUNITY): Payer: Self-pay | Admitting: Internal Medicine

## 2020-09-24 DIAGNOSIS — A419 Sepsis, unspecified organism: Secondary | ICD-10-CM

## 2020-09-24 DIAGNOSIS — E041 Nontoxic single thyroid nodule: Secondary | ICD-10-CM

## 2020-09-24 DIAGNOSIS — I10 Essential (primary) hypertension: Secondary | ICD-10-CM

## 2020-09-24 DIAGNOSIS — E785 Hyperlipidemia, unspecified: Secondary | ICD-10-CM

## 2020-09-24 DIAGNOSIS — F419 Anxiety disorder, unspecified: Secondary | ICD-10-CM

## 2020-09-24 DIAGNOSIS — I480 Paroxysmal atrial fibrillation: Secondary | ICD-10-CM

## 2020-09-24 HISTORY — DX: Essential (primary) hypertension: I10

## 2020-09-24 HISTORY — DX: Sepsis, unspecified organism: A41.9

## 2020-09-24 LAB — CULTURE, BLOOD (ROUTINE X 2)
Culture: NO GROWTH
Culture: NO GROWTH
Special Requests: ADEQUATE

## 2020-09-24 LAB — BASIC METABOLIC PANEL
Anion gap: 9 (ref 5–15)
BUN: 9 mg/dL (ref 8–23)
CO2: 23 mmol/L (ref 22–32)
Calcium: 8.6 mg/dL — ABNORMAL LOW (ref 8.9–10.3)
Chloride: 106 mmol/L (ref 98–111)
Creatinine, Ser: 0.51 mg/dL (ref 0.44–1.00)
GFR, Estimated: >60 mL/min (ref >60)
Glucose, Bld: 122 mg/dL — ABNORMAL HIGH (ref 70–99)
Potassium: 3.8 mmol/L (ref 3.5–5.1)
Sodium: 138 mmol/L (ref 135–145)

## 2020-09-24 LAB — SARS CORONAVIRUS 2 (TAT 6-24 HRS): SARS Coronavirus 2: NEGATIVE

## 2020-09-24 MED ORDER — AMOXICILLIN-POT CLAVULANATE 875-125 MG PO TABS: 1.0000 | ORAL_TABLET | Freq: Two times a day (BID) | ORAL | 0 refills | Status: AC

## 2020-09-24 MED ORDER — CITALOPRAM HYDROBROMIDE 20 MG PO TABS: 20.0000 mg | ORAL_TABLET | Freq: Every day | ORAL | 0 refills | Status: AC

## 2020-09-24 MED ORDER — HYDROCODONE-ACETAMINOPHEN 5-325 MG PO TABS
1.0000 | ORAL_TABLET | Freq: Every day | ORAL | 0 refills | Status: AC | PRN
Start: 2020-09-24 — End: ?

## 2020-09-24 MED ORDER — ASPIRIN 81 MG PO TBEC: 81.0000 mg | DELAYED_RELEASE_TABLET | Freq: Every day | ORAL | 0 refills | Status: DC

## 2020-09-24 MED ORDER — DIAZEPAM 2 MG PO TABS: 1.0000 mg | ORAL_TABLET | Freq: Two times a day (BID) | ORAL | 0 refills | Status: AC

## 2020-09-24 MED ORDER — METOPROLOL TARTRATE 25 MG PO TABS: 25.0000 mg | ORAL_TABLET | Freq: Two times a day (BID) | ORAL | 0 refills | Status: AC

## 2020-09-24 NOTE — Discharge Summary (Addendum)
Physician Discharge Summary  [name] GNF:[id number] DOB: 12/25/1935 DOA: 09/19/2020  PCP: Reynold Bowen, MD  Admit date: 09/19/2020 Discharge date: 09/25/2020  Admitted From: SNF Disposition:  SNF  Recommendations for Outpatient Follow-up:  1. Follow up with PCP in 1 week 2. Continue outpatient palliative care   3. 7 cm left thyroid nodule, similar to CT chest 04/26/2020. Recommend thyroid US as outpatient   Discharge Condition: Stable CODE STATUS: DNR  Diet recommendations: Dysphagia 3 (mechanical soft);Thin liquid Liquids provided via: Straw Medication Administration: Crushed with puree Supervision: Staff to assist with self feeding Compensations: Minimize environmental distractions;Slow rate;Small sips/bites Postural Changes and/or Swallow Maneuvers: Seated upright 90 degrees;Upright 30-60 min after meal (HOB now below 27* please due to large hiatal hernia)  Brief/Interim Summary: [name] is an 85 y.o.femalewith medical history significant ofcerebral palsy, depression, GERD, dementia who presented with N/V. She had reportedly been vomiting feculent material lately. SNF had concern that she had a UTI, so they started treating her for that with antibiotic. On arrival to the ED, she was found to be febrile, tachycardic, tachypneic as well as hypotensive. She was given IVF. CXR was concerned for pneumonia and UA concerning for UTI. She was started on IV antibiotics, had improvement in clinical course.  Antibiotic was deescalated to Augmentin.  Patient also was evaluated by speech-language pathology as well as palliative care medicine during hospitalization.  Discharge Diagnoses:  Principal Problem:   Sepsis ([place]) Active Problems:   Hypokalemia   Lower urinary tract infectious disease   Cerebral palsy (HCC)   Hiatal hernia with gastroesophageal reflux   Dysphagia   Aspiration pneumonia (HCC)   Sepsis secondary to UTI (HCC)   HTN (hypertension)   AF (paroxysmal atrial  fibrillation) (HCC)   HLD (hyperlipidemia)   Anxiety   Thyroid nodule   Severe sepsis secondary to aspiration pneumonia and UTI -Patient found to be febrile, tachycardic, tachypneic with leukocytosis, lactic acidosis with hypotension at the time of admission -UA positive for UTI but urine culture negative -Blood cultures negative  -CT C/A/P: Dense airspace consolidation in the right upper lobe, right middle lobe, and right lower lobe with consolidative airspace disease in the left lower lobe. Imaging features are compatible with multifocal pneumonia. Ill-defined nodular opacities in the central right upper lobe are likely related. Follow-up recommended to ensure resolution. No acute findings in the abdomen or pelvis. Large hiatal hernia with more than 75% of the stomach contained in the chest. -Leukocytosis resolved -Rocephin, azithromycin, Flagyl --> Augmentin (end date 2/27 for total 7 day course)   -Weaned to 2L O2, continue to wean as able to room air as tolerated  Essential hypertension -Hypotension has now resolved, resume home metoprolol  New onset paroxysmal atrial fibrillation -Now in normal sinus rhythm -CHA2DS2-VASc score 3, due to paroxysmal nature of A. fib with her chronic debility, she was started on aspirin only.  Can consider advancing to full anticoagulation if A. fib persists  Cerebral palsy -Supportive care  Hyperlipidemia -Continue lipitor   Anxiety -Continue buspar, celexa at lower dosage as rec by pharmacy, valium, zyprexa   Thyroid nodule -7 cm left thyroid nodule, similar to CT chest 04/26/2020. Recommend thyroid US as outpatient    Discharge Instructions  Discharge Instructions    Increase activity slowly   Complete by: As directed      Allergies as of 09/24/2020   No Known Allergies     Medication List    STOP taking these medications   promethazine  25 MG/ML injection Commonly known as: PHENERGAN   Valproate Sodium 250 MG/5ML Soln  solution Commonly known as: DEPAKENE     TAKE these medications   albuterol (2.5 MG/3ML) 0.083% NEBU 3 mL, albuterol (5 MG/ML) 0.5% NEBU 0.5 mL Inhale 2.5 mg into the lungs every 6 (six) hours. Albuterol sul 2.5mg /2ml solution q6hr prn wheezing   amoxicillin-clavulanate 875-125 MG tablet Commonly known as: AUGMENTIN Take 1 tablet by mouth every 12 (twelve) hours for 2 days.   aspirin 81 MG EC tablet Take 1 tablet (81 mg total) by mouth daily. Swallow whole.   atorvastatin 10 MG tablet Commonly known as: LIPITOR Take 10 mg by mouth daily.   bisacodyl 10 MG suppository Commonly known as: DULCOLAX Place 10 mg rectally every 3 (three) days.   busPIRone 15 MG tablet Commonly known as: BUSPAR Take 15 mg by mouth 3 (three) times daily.   CHLORASEPTIC MT Use as directed in the mouth or throat. One spray to throat every 2 hours as needed for sore throad   cholecalciferol 1000 units tablet Commonly known as: VITAMIN D Take 2,000 Units by mouth daily.   citalopram 20 MG tablet Commonly known as: CELEXA Take 1 tablet (20 mg total) by mouth daily. What changed:   medication strength  how much to take   Cranberry 450 MG Caps Take 1 capsule by mouth 2 (two) times daily.   diazepam 2 MG tablet Commonly known as: VALIUM Take 0.5 tablets (1 mg total) by mouth 2 (two) times daily.   diphenhydrAMINE 25 mg capsule Commonly known as: BENADRYL Take 25 mg by mouth every 8 (eight) hours as needed for itching.   docusate sodium 100 MG capsule Commonly known as: COLACE Take 100 mg by mouth daily.   ferrous sulfate 220 (44 Fe) MG/5ML solution Take 220 mg by mouth at bedtime.   fluticasone 50 MCG/ACT nasal spray Commonly known as: FLONASE Place 1 spray into both nostrils daily.   guaifenesin 100 MG/5ML syrup Commonly known as: ROBITUSSIN Take 200 mg by mouth 4 (four) times daily as needed for cough.   HYDROcodone-acetaminophen 5-325 MG tablet Commonly known as:  NORCO/VICODIN Take 1 tablet by mouth daily as needed for moderate pain. One tablet by mouth daily at lunch time as needed for breakthrough pain What changed:   when to take this  reasons to take this  additional instructions   hydrocortisone cream 1 % Apply 1 application topically 2 (two) times daily as needed for itching. Apply to affected areas twice daily as needed for itching   ketoconazole 2 % cream Commonly known as: NIZORAL Apply 1 application topically See admin instructions. Shampoo hair twice weekly for treatment of dry scalp   ketotifen 0.025 % ophthalmic solution Commonly known as: ZADITOR Place 1 drop into both eyes 2 (two) times daily as needed.   loperamide 2 MG tablet Commonly known as: IMODIUM A-D Take 2 mg by mouth 4 (four) times daily as needed for diarrhea or loose stools. Give 4mg  po prn loose stools. May repeat 2mg  po tid for additional loose stools   metoprolol tartrate 25 MG tablet Commonly known as: LOPRESSOR Take 1 tablet (25 mg total) by mouth 2 (two) times daily.   OLANZapine 2.5 MG tablet Commonly known as: ZYPREXA Take 1.25 mg by mouth at bedtime.   ondansetron 4 MG tablet Commonly known as: ZOFRAN Take 4 mg by mouth every 8 (eight) hours as needed for nausea or vomiting.   pantoprazole 40 MG tablet Commonly  known as: Protonix Take 1 tablet (40 mg total) by mouth 2 (two) times daily.   polyethylene glycol 17 g packet Commonly known as: MIRALAX / GLYCOLAX Take 17 g by mouth at bedtime.   saccharomyces boulardii 250 MG capsule Commonly known as: FLORASTOR Take 250 mg by mouth 2 (two) times daily.   senna 8.6 MG tablet Commonly known as: SENOKOT Take 1 tablet by mouth daily as needed.   sucralfate 1 g tablet Commonly known as: CARAFATE Take 1 g by mouth 2 (two) times daily.   triamcinolone 0.1 % Commonly known as: KENALOG Apply 1 application topically 2 (two) times daily.       Follow-up Information    Reynold Bowen, MD  Follow up.   Specialty: Endocrinology Contact information: [address] [place] [address] [phone]              No Known Allergies  Consultations:  Palliative care medicine    Procedures/Studies: CT CHEST WO CONTRAST  Result Date: 09/19/2020 CLINICAL DATA:  Nausea vomiting.  Sepsis. EXAM: CT CHEST, ABDOMEN, AND PELVIS WITH CONTRAST TECHNIQUE: Multidetector CT imaging of the chest, abdomen and pelvis was performed following the standard protocol during bolus administration of intravenous contrast. CONTRAST:  149mL OMNIPAQUE IOHEXOL 300 MG/ML  SOLN COMPARISON:  CT chest 04/26/2020. abdomen/pelvis CT 05/10/2014. FINDINGS: CT CHEST FINDINGS Cardiovascular: Heart size upper normal. No substantial pericardial effusion. Atherosclerotic calcification is noted in the wall of the thoracic aorta. Mediastinum/Nodes: 6.9 x 5.4 cm left thyroid mass measures slightly larger than on the prior study when it was reported at 6.1 x 4.3 cm. 12 mm short axis prevascular lymph node is stable in the interval. 13 mm short axis low right paratracheal node is similar to prior no axillary lymphadenopathy. Large hiatal hernia evident with more than 75% of the stomach contained in the chest. Lungs/Pleura: Centrilobular emphsyema noted. Dense airspace consolidation noted posterior right upper lobe, right middle lobe and right lower lobe. There is consolidative airspace disease in the left lower lobe as well. Areas of irregular nodular airspace disease in the mid right upper lobe likely related to the more diffuse process in the right lung. Tiny right pleural effusion noted. Multiple small lymph nodes in collateral vessels are seen in the right thoracic inlet. Musculoskeletal: No worrisome lytic or sclerotic osseous abnormality. CT ABDOMEN PELVIS FINDINGS Hepatobiliary: No suspicious focal abnormality within the liver parenchyma. There is no evidence for gallstones, gallbladder wall thickening, or pericholecystic  fluid. No intrahepatic or extrahepatic biliary dilation. Pancreas: No focal mass lesion. No dilatation of the main duct. No intraparenchymal cyst. No peripancreatic edema. Spleen: No splenomegaly. No focal mass lesion. Adrenals/Urinary Tract: No adrenal nodule or mass. 3 cm cyst in the interpolar left kidney is new in the interval. Are too small to characterize. Additional tiny hypodensities in the left kidney right kidney unremarkable. No evidence for hydroureter. The urinary bladder appears normal for the degree of distention. Stomach/Bowel: As above, there is a large hiatal hernia. Duodenum is normally positioned as is the ligament of Treitz. No small bowel wall thickening. No small bowel dilatation. The terminal ileum is normal. The appendix is normal. No gross colonic mass. No colonic wall thickening. Vascular/Lymphatic: There is abdominal aortic atherosclerosis without aneurysm. Portal vein and superior mesenteric vein are patent. Splenic vein is patent. Celiac axis and SMA are opacified. IMA is opacified. There is no gastrohepatic or hepatoduodenal ligament lymphadenopathy. No retroperitoneal or mesenteric lymphadenopathy. No pelvic sidewall lymphadenopathy. Reproductive: The uterus is unremarkable.  There is no adnexal mass. Other: No intraperitoneal free fluid. Musculoskeletal: Bones are diffusely demineralized. Surgical hardware noted proximal right femur IMPRESSION: 1. Dense airspace consolidation in the right upper lobe, right middle lobe, and right lower lobe with consolidative airspace disease in the left lower lobe. Imaging features are compatible with multifocal pneumonia. Ill-defined nodular opacities in the central right upper lobe are likely related. Follow-up recommended to ensure resolution. 2. Tiny right pleural effusion. 3. Stable mild mediastinal lymphadenopathy, likely reactive. 4. 7 cm left thyroid nodule, similar to CT chest 04/26/2020. Recommend thyroid US. (Ref: J Am Coll Radiol. 2015  Feb;12(2): 143-50). 5. No acute findings in the abdomen or pelvis. 6. Large hiatal hernia with more than 75% of the stomach contained in the chest. 7. Interval development of a 3 cm cyst in the interpolar left kidney with other tiny hypodensities in the left kidney too small to characterize. 8. Aortic Atherosclerosis (ICD10-I70.0) and Emphysema (ICD10-J43.9). Electronically Signed   By: Misty Stanley M.D.   On: 09/19/2020 15:16   CT ABDOMEN PELVIS W CONTRAST  Result Date: 09/19/2020 CLINICAL DATA:  Nausea vomiting.  Sepsis. EXAM: CT CHEST, ABDOMEN, AND PELVIS WITH CONTRAST TECHNIQUE: Multidetector CT imaging of the chest, abdomen and pelvis was performed following the standard protocol during bolus administration of intravenous contrast. CONTRAST:  188mL OMNIPAQUE IOHEXOL 300 MG/ML  SOLN COMPARISON:  CT chest 04/26/2020. abdomen/pelvis CT 05/10/2014. FINDINGS: CT CHEST FINDINGS Cardiovascular: Heart size upper normal. No substantial pericardial effusion. Atherosclerotic calcification is noted in the wall of the thoracic aorta. Mediastinum/Nodes: 6.9 x 5.4 cm left thyroid mass measures slightly larger than on the prior study when it was reported at 6.1 x 4.3 cm. 12 mm short axis prevascular lymph node is stable in the interval. 13 mm short axis low right paratracheal node is similar to prior no axillary lymphadenopathy. Large hiatal hernia evident with more than 75% of the stomach contained in the chest. Lungs/Pleura: Centrilobular emphsyema noted. Dense airspace consolidation noted posterior right upper lobe, right middle lobe and right lower lobe. There is consolidative airspace disease in the left lower lobe as well. Areas of irregular nodular airspace disease in the mid right upper lobe likely related to the more diffuse process in the right lung. Tiny right pleural effusion noted. Multiple small lymph nodes in collateral vessels are seen in the right thoracic inlet. Musculoskeletal: No worrisome lytic or  sclerotic osseous abnormality. CT ABDOMEN PELVIS FINDINGS Hepatobiliary: No suspicious focal abnormality within the liver parenchyma. There is no evidence for gallstones, gallbladder wall thickening, or pericholecystic fluid. No intrahepatic or extrahepatic biliary dilation. Pancreas: No focal mass lesion. No dilatation of the main duct. No intraparenchymal cyst. No peripancreatic edema. Spleen: No splenomegaly. No focal mass lesion. Adrenals/Urinary Tract: No adrenal nodule or mass. 3 cm cyst in the interpolar left kidney is new in the interval. Are too small to characterize. Additional tiny hypodensities in the left kidney right kidney unremarkable. No evidence for hydroureter. The urinary bladder appears normal for the degree of distention. Stomach/Bowel: As above, there is a large hiatal hernia. Duodenum is normally positioned as is the ligament of Treitz. No small bowel wall thickening. No small bowel dilatation. The terminal ileum is normal. The appendix is normal. No gross colonic mass. No colonic wall thickening. Vascular/Lymphatic: There is abdominal aortic atherosclerosis without aneurysm. Portal vein and superior mesenteric vein are patent. Splenic vein is patent. Celiac axis and SMA are opacified. IMA is opacified. There is no gastrohepatic or hepatoduodenal ligament lymphadenopathy.  No retroperitoneal or mesenteric lymphadenopathy. No pelvic sidewall lymphadenopathy. Reproductive: The uterus is unremarkable.  There is no adnexal mass. Other: No intraperitoneal free fluid. Musculoskeletal: Bones are diffusely demineralized. Surgical hardware noted proximal right femur IMPRESSION: 1. Dense airspace consolidation in the right upper lobe, right middle lobe, and right lower lobe with consolidative airspace disease in the left lower lobe. Imaging features are compatible with multifocal pneumonia. Ill-defined nodular opacities in the central right upper lobe are likely related. Follow-up recommended to ensure  resolution. 2. Tiny right pleural effusion. 3. Stable mild mediastinal lymphadenopathy, likely reactive. 4. 7 cm left thyroid nodule, similar to CT chest 04/26/2020. Recommend thyroid US. (Ref: J Am Coll Radiol. 2015 Feb;12(2): 143-50). 5. No acute findings in the abdomen or pelvis. 6. Large hiatal hernia with more than 75% of the stomach contained in the chest. 7. Interval development of a 3 cm cyst in the interpolar left kidney with other tiny hypodensities in the left kidney too small to characterize. 8. Aortic Atherosclerosis (ICD10-I70.0) and Emphysema (ICD10-J43.9). Electronically Signed   By: Misty Stanley M.D.   On: 09/19/2020 15:16   DG CHEST PORT 1 VIEW  Result Date: 09/21/2020 CLINICAL DATA:  Nausea and vomiting.  Cerebral palsy. EXAM: PORTABLE CHEST 1 VIEW COMPARISON:  Chest x-ray 09/19/2020. FINDINGS: Cardiomegaly. Mild bilateral pulmonary infiltrates, right side greater than left. Small bilateral pleural effusions. A component CHF may be present. Bilateral pneumonia cannot be excluded. Low lung volumes. No pneumothorax. Prominent hiatal hernia again noted. IMPRESSION: 1. Cardiomegaly with mild bilateral pulmonary infiltrates, right side greater than left. Small bilateral pleural effusions. A component of CHF may be present. Bilateral pneumonia cannot be excluded. Low lung volumes. 2.  Prominent hiatal hernia again noted. Electronically Signed   By: Marcello Moores  Register   On: 09/21/2020 11:01   DG Chest Port 1 View  Result Date: 09/19/2020 CLINICAL DATA:  Hypoxia after fluid resuscitation.  Vomiting. EXAM: PORTABLE CHEST 1 VIEW COMPARISON:  Earlier today FINDINGS: Cardiomegaly and vascular pedicle widening accentuated by rotation. Additional tracheal mass effect from a large left thyroid nodule extending into the mediastinum. Large hiatal hernia. Vascular congestion. Asymmetric indistinct density at the left base. No Kerley lines or effusion. No pneumothorax. IMPRESSION: 1. Vascular congestion. 2.  Cannot exclude a left lower lobe infiltrate, certainty limited by patient rotation 3. Large hiatal hernia. Electronically Signed   By: Monte Fantasia M.D.   On: 09/19/2020 07:30   DG Chest Port 1 View  Result Date: 09/19/2020 CLINICAL DATA:  Vomiting feces since yesterday. EXAM: PORTABLE CHEST 1 VIEW COMPARISON:  May 03, 2020 FINDINGS: The study is limited secondary to patient positioning. Stable, chronic appearing increased lung markings are noted, bilaterally. Very mild right suprahilar and bilateral infrahilar atelectasis and/or infiltrate is noted. There is no evidence of a pleural effusion or pneumothorax. Stable enlargement of the cardiac silhouette is seen. Multilevel degenerative changes are noted throughout the thoracic spine. IMPRESSION: Mild right suprahilar and bilateral infrahilar atelectasis and/or infiltrate. Electronically Signed   By: Virgina Norfolk M.D.   On: 09/19/2020 02:52       Discharge Exam: Vitals:   09/24/20 0503 09/24/20 0611  BP: 126/77 125/72  Pulse: 76   Resp: 20 18  Temp: 98.4 F (36.9 C) 97.9 F (36.6 C)  SpO2: 93% 93%    General: Pt is alert, awake, not in acute distress Cardiovascular: RRR, S1/S2 +, no edema Respiratory: CTA bilaterally, no wheezing, no rhonchi, no respiratory distress, no conversational dyspnea  Abdominal: Soft,  NT, ND, bowel sounds + Extremities: no edema, no cyanosis, contractures consistent with CP history  Psych: Normal mood and affect, stable judgement and insight     The results of significant diagnostics from this hospitalization (including imaging, microbiology, ancillary and laboratory) are listed below for reference.     Microbiology: Recent Results (from the past 240 hour(s))  Blood culture (routine x 2)     Status: None (Preliminary result)   Collection Time: 09/19/20  3:24 AM   Specimen: BLOOD  Result Value Ref Range Status   Specimen Description   Final    BLOOD SITE NOT SPECIFIED Performed at [facility], [address], [place], [place] [address]    Special Requests   Final    BOTTLES DRAWN AEROBIC AND ANAEROBIC Blood Culture adequate volume Performed at [facility] [address] [place], [place] [address]    Culture   Final    NO GROWTH 4 DAYS Performed at [facility], [place] [address] [place], [place] [address]    Report Status PENDING  Incomplete  Blood culture (routine x 2)     Status: None (Preliminary result)   Collection Time: 09/19/20  3:25 AM   Specimen: BLOOD  Result Value Ref Range Status   Specimen Description   Final    BLOOD SITE NOT SPECIFIED Performed at [facility] [address] [place],  [place] [address]    Special Requests   Final    BOTTLES DRAWN AEROBIC AND ANAEROBIC Blood Culture results may not be optimal due to an inadequate volume of blood received in culture bottles Performed at [facility] [address] [place], [place] [address]    Culture   Final    NO GROWTH 4 DAYS Performed at [facility], [place] [address], [place], [place] [address]    Report Status PENDING  Incomplete  Resp Panel by RT-PCR (Flu A&B, Covid) Nasopharyngeal Swab     Status: None   Collection Time: 09/19/20  3:25 AM   Specimen: Nasopharyngeal Swab; Nasopharyngeal(NP) swabs in vial transport medium  Result Value Ref Range Status   SARS Coronavirus 2 by RT PCR NEGATIVE NEGATIVE Final    Comment: (NOTE) SARS-CoV-2 target nucleic acids are NOT DETECTED.  The SARS-CoV-2 RNA is generally detectable in upper respiratory specimens during the acute phase of infection. The lowest concentration of SARS-CoV-2 viral copies this assay can detect is 138 copies/mL. A negative result does not preclude SARS-Cov-2 infection and should not be used as the sole basis for treatment or other patient management decisions. A negative result may occur with  improper specimen collection/handling, submission of specimen other than  nasopharyngeal swab, presence of viral mutation(s) within the areas targeted by this assay, and inadequate number of viral copies(<138 copies/mL). A negative result must be combined with clinical observations, patient history, and epidemiological information. The expected result is Negative.  Fact Sheet for Patients:  EntrepreneurPulse.com.au  Fact Sheet for Healthcare Providers:  IncredibleEmployment.be  This test is no t yet approved or cleared by the [place] FDA and  has been authorized for detection and/or diagnosis of SARS-CoV-2 by FDA under an Emergency Use Authorization (EUA). This EUA will remain  in effect (meaning this test can be used) for the duration of the COVID-19 declaration under Section 564(b)(1) of the Act, 21 U.S.C.section 360bbb-3(b)(1), unless the authorization is terminated  or revoked sooner.       Influenza A by PCR  NEGATIVE NEGATIVE Final   Influenza B by PCR NEGATIVE NEGATIVE Final    Comment: (NOTE) The Xpert Xpress SARS-CoV-2/FLU/RSV plus assay is intended as an aid in the diagnosis of influenza from Nasopharyngeal swab specimens and should not be used as a sole basis for treatment. Nasal washings and aspirates are unacceptable for Xpert Xpress SARS-CoV-2/FLU/RSV testing.  Fact Sheet for Patients: EntrepreneurPulse.com.au  Fact Sheet for Healthcare Providers: IncredibleEmployment.be  This test is not yet approved or cleared by the [place] FDA and has been authorized for detection and/or diagnosis of SARS-CoV-2 by FDA under an Emergency Use Authorization (EUA). This EUA will remain in effect (meaning this test can be used) for the duration of the COVID-19 declaration under Section 564(b)(1) of the Act, 21 U.S.C. section 360bbb-3(b)(1), unless the authorization is terminated or revoked.  Performed at [facility], [place] [address] [place], [place] [address]   Urine culture     Status: Abnormal   Collection Time: 09/19/20  5:08 AM   Specimen: Urine, Catheterized  Result Value Ref Range Status   Specimen Description   Final    URINE, CATHETERIZED Performed at [facility] [address] [place], [place] [address]    Special Requests   Final    NONE Performed at [facility], [place] [address], [place], [place] [address]    Culture MULTIPLE SPECIES PRESENT, SUGGEST RECOLLECTION (A)  Final   Report Status 09/20/2020 FINAL  Final  Culture, Urine     Status: Abnormal   Collection Time: 09/21/20  9:23 AM   Specimen: Urine, Clean Catch  Result Value Ref Range Status   Specimen Description   Final    URINE, CLEAN CATCH Performed at [facility], [place] [address] [place], [place] [address]    Special Requests   Final    NONE Performed at [facility], [place] [address] [place], [place] [id number]    Culture (A)  Final    <10,000 COLONIES/mL INSIGNIFICANT GROWTH Performed at [facility] [address], [place], [place] [address]    Report Status 09/22/2020 FINAL  Final     Labs: BNP (last 3 results) Recent Labs    09/21/20 0252  BNP 024.0*   Basic Metabolic Panel: Recent Labs  Lab 09/19/20 1450 09/20/20 0220 09/21/20 0252 09/22/20 0240 09/23/20 0348 09/24/20 0342  NA 141 141 144 142 140 138  K 3.8 3.4* 2.7* 3.0* 3.4* 3.8  CL 112* 111 111 104 106 106  CO2 23 21* 23 27 24 23   GLUCOSE 90 87 100* 102* 104* 122*  BUN 12 11 7* <5* <5* 9  CREATININE 0.49 0.45 0.44 0.36* 0.37* 0.51  CALCIUM 7.4* 7.8* 8.3* 8.2* 8.4* 8.6*  MG 1.6*  --  1.6*  --  1.9  --    Liver Function Tests: Recent Labs  Lab 09/19/20 1450 09/19/20 1630 09/20/20 0220 09/21/20 0252 09/22/20 0240  AST 21 20 19 18 15   ALT 13 11 13 13 13   ALKPHOS 53 52 56 60 60  BILITOT 0.6 0.6 0.7 0.7 0.8  PROT 5.0* 5.4* 5.4* 5.5* 5.8*  ALBUMIN 2.3* 2.4* 2.5*  2.6* 2.8*   Recent Labs  Lab 09/19/20 1630  LIPASE 21   No results for input(s): AMMONIA in the last 168 hours. CBC: Recent Labs  Lab 09/19/20 0206 09/20/20 0220 09/21/20 0252 09/22/20 0240  WBC 14.7* 15.4* 11.5* 9.2  NEUTROABS 13.5*  --   --   --  HGB 13.5 10.9* 10.5* 11.6*  HCT 42.2 35.7* 33.2* 36.6  MCV 89.8 93.5 90.7 90.1  PLT 209 152 183 211   Cardiac Enzymes: No results for input(s): CKTOTAL, CKMB, CKMBINDEX, TROPONINI in the last 168 hours. BNP: Invalid input(s): POCBNP CBG: No results for input(s): GLUCAP in the last 168 hours. D-Dimer No results for input(s): DDIMER in the last 72 hours. Hgb A1c No results for input(s): HGBA1C in the last 72 hours. Lipid Profile No results for input(s): CHOL, HDL, LDLCALC, TRIG, CHOLHDL, LDLDIRECT in the last 72 hours. Thyroid function studies No results for input(s): TSH, T4TOTAL, T3FREE, THYROIDAB in the last 72 hours.  Invalid input(s): FREET3 Anemia work up No results for input(s): VITAMINB12, FOLATE, FERRITIN, TIBC, IRON, RETICCTPCT in the last 72 hours. Urinalysis    Component Value Date/Time   COLORURINE AMBER (A) 09/19/2020 0508   APPEARANCEUR TURBID (A) 09/19/2020 0508   LABSPEC 1.020 09/19/2020 0508   PHURINE 5.0 09/19/2020 0508   GLUCOSEU NEGATIVE 09/19/2020 0508   HGBUR MODERATE (A) 09/19/2020 0508   BILIRUBINUR NEGATIVE 09/19/2020 0508   KETONESUR NEGATIVE 09/19/2020 0508   PROTEINUR NEGATIVE 09/19/2020 0508   UROBILINOGEN 0.2 03/25/2012 1814   NITRITE NEGATIVE 09/19/2020 0508   LEUKOCYTESUR MODERATE (A) 09/19/2020 0508   Sepsis Labs Invalid input(s): PROCALCITONIN,  WBC,  LACTICIDVEN Microbiology Recent Results (from the past 240 hour(s))  Blood culture (routine x 2)     Status: None (Preliminary result)   Collection Time: 09/19/20  3:24 AM   Specimen: BLOOD  Result Value Ref Range Status   Specimen Description   Final    BLOOD SITE NOT SPECIFIED Performed at [facility], [place]  [address], [place], [place] [address]    Special Requests   Final    BOTTLES DRAWN AEROBIC AND ANAEROBIC Blood Culture adequate volume Performed at [facility] [address], [place], [place] [address]    Culture   Final    NO GROWTH 4 DAYS Performed at [facility], [place] [address], [place], [place] [address]    Report Status PENDING  Incomplete  Blood culture (routine x 2)     Status: None (Preliminary result)   Collection Time: 09/19/20  3:25 AM   Specimen: BLOOD  Result Value Ref Range Status   Specimen Description   Final    BLOOD SITE NOT SPECIFIED Performed at [facility] [address], [place], [place] [address]    Special Requests   Final    BOTTLES DRAWN AEROBIC AND ANAEROBIC Blood Culture results may not be optimal due to an inadequate volume of blood received in culture bottles Performed at [facility] [address], [place], [place] [address]    Culture   Final    NO GROWTH 4 DAYS Performed at [facility], [place] [address], [place], [place] [address]    Report Status PENDING  Incomplete  Resp Panel by RT-PCR (Flu A&B, Covid) Nasopharyngeal Swab     Status: None   Collection Time: 09/19/20  3:25 AM   Specimen: Nasopharyngeal Swab; Nasopharyngeal(NP) swabs in vial transport medium  Result Value Ref Range Status   SARS Coronavirus 2 by RT PCR NEGATIVE NEGATIVE Final    Comment: (NOTE) SARS-CoV-2 target nucleic acids are NOT DETECTED.  The SARS-CoV-2 RNA is generally detectable in upper respiratory specimens during the acute phase of infection. The lowest concentration of SARS-CoV-2 viral copies this assay can detect is 138 copies/mL. A  negative result does not preclude SARS-Cov-2 infection and should not be used as the sole basis for treatment or other patient management decisions. A negative result may occur with  improper specimen collection/handling, submission of specimen other than nasopharyngeal swab,  presence of viral mutation(s) within the areas targeted by this assay, and inadequate number of viral copies(<138 copies/mL). A negative result must be combined with clinical observations, patient history, and epidemiological information. The expected result is Negative.  Fact Sheet for Patients:  EntrepreneurPulse.com.au  Fact Sheet for Healthcare Providers:  IncredibleEmployment.be  This test is no t yet approved or cleared by the [place] FDA and  has been authorized for detection and/or diagnosis of SARS-CoV-2 by FDA under an Emergency Use Authorization (EUA). This EUA will remain  in effect (meaning this test can be used) for the duration of the COVID-19 declaration under Section 564(b)(1) of the Act, 21 U.S.C.section 360bbb-3(b)(1), unless the authorization is terminated  or revoked sooner.       Influenza A by PCR NEGATIVE NEGATIVE Final   Influenza B by PCR NEGATIVE NEGATIVE Final    Comment: (NOTE) The Xpert Xpress SARS-CoV-2/FLU/RSV plus assay is intended as an aid in the diagnosis of influenza from Nasopharyngeal swab specimens and should not be used as a sole basis for treatment. Nasal washings and aspirates are unacceptable for Xpert Xpress SARS-CoV-2/FLU/RSV testing.  Fact Sheet for Patients: EntrepreneurPulse.com.au  Fact Sheet for Healthcare Providers: IncredibleEmployment.be  This test is not yet approved or cleared by the [place] FDA and has been authorized for detection and/or diagnosis of SARS-CoV-2 by FDA under an Emergency Use Authorization (EUA). This EUA will remain in effect (meaning this test can be used) for the duration of the COVID-19 declaration under Section 564(b)(1) of the Act, 21 U.S.C. section 360bbb-3(b)(1), unless the authorization is terminated or revoked.  Performed at [facility], [place] [address] [place], [place] [address]   Urine  culture     Status: Abnormal   Collection Time: 09/19/20  5:08 AM   Specimen: Urine, Catheterized  Result Value Ref Range Status   Specimen Description   Final    URINE, CATHETERIZED Performed at [facility] [address], [place], [place] [address]    Special Requests   Final    NONE Performed at [facility], [place] [address], [place], [place] [address]    Culture MULTIPLE SPECIES PRESENT, SUGGEST RECOLLECTION (A)  Final   Report Status 09/20/2020 FINAL  Final  Culture, Urine     Status: Abnormal   Collection Time: 09/21/20  9:23 AM   Specimen: Urine, Clean Catch  Result Value Ref Range Status   Specimen Description   Final    URINE, CLEAN CATCH Performed at [facility], [place] [address], [place], [place] [address]    Special Requests   Final    NONE Performed at [facility], [place] [address] [place], [place] [address]    Culture (A)  Final    <10,000 COLONIES/mL INSIGNIFICANT GROWTH Performed at [facility] [address], [place],  [address]    Report Status 09/22/2020 FINAL  Final     Patient was seen and examined on the day of discharge and was found to be in stable condition. Time coordinating discharge: 35 minutes including assessment and coordination of care, as well as examination of the patient.   SIGNED:  Dessa Phi, DO [facility] 09/24/2020, 10:04 AM

## 2020-09-24 NOTE — NC FL2 (Signed)
[place] LEVEL OF CARE SCREENING TOOL     IDENTIFICATION  Patient Name: [name] Birthdate: 1936/03/19 Sex: female Admission Date (Current Location): 09/19/2020  [facility] and [place] Number:  Herbalist and Address:  [facility],  [place] [employer], [place]      Provider Number: [id number]  Attending Physician Name and Address:  Dessa Phi, DO  Relative Name and Phone Number:  Sherley Bounds (Other)   [phone]    Current Level of Care: Hospital Recommended Level of Care: [place] Prior Approval Number:    Date Approved/Denied:   PASRR Number:    Discharge Plan: SNF ([facility])    Current Diagnoses: Patient Active Problem List   Diagnosis Date Noted  . Sepsis secondary to UTI ([place]) 09/24/2020  . HTN (hypertension) 09/24/2020  . AF (paroxysmal atrial fibrillation) ([place]) 09/24/2020  . HLD (hyperlipidemia) 09/24/2020  . Anxiety 09/24/2020  . Thyroid nodule 09/24/2020  . Sepsis ([place]) 09/19/2020  . Choking episode   . Hypoxia   . Aspiration pneumonia ([place]) 04/26/2020  . Dysphagia 11/30/2015  . Hiatal hernia with gastroesophageal reflux 05/10/2014  . Spastic paraplegia 05/06/2014  . C. difficile colitis 03/27/2012  . Colitis 03/26/2012  . Diarrhea 03/25/2012  . Lower urinary tract infectious disease 03/25/2012  . Cerebral palsy ([place]) 03/25/2012  . H/O: GI bleed 03/25/2012  . Esophageal ulcer 01/29/2012  . Hypokalemia 01/29/2012  . Anemia 01/29/2012  . Acute upper GI bleed 01/28/2012  . Hematemesis 01/28/2012    Orientation RESPIRATION BLADDER Height & Weight     Self,Place  O2 (2L [place]) Incontinent Weight: 50 kg Height:  5\' 2"  (157.5 cm)  BEHAVIORAL SYMPTOMS/MOOD NEUROLOGICAL BOWEL NUTRITION STATUS   (none)  (none) Incontinent Diet (see d/c summary)  AMBULATORY STATUS COMMUNICATION OF NEEDS Skin   Extensive Assist Verbally Normal                       Personal Care Assistance  Level of Assistance  Bathing,Feeding Bathing Assistance: Maximum assistance Feeding assistance: Maximum assistance       Functional Limitations Info  Sight,Hearing,Speech Sight Info: Adequate Hearing Info: Adequate Speech Info: Adequate    SPECIAL CARE FACTORS FREQUENCY                       Contractures Contractures Info: Not present    Additional Factors Info  Code Status,Psychotropic Code Status Info: DNR   Psychotropic Info: see d/c summary         Current Medications (09/24/2020):  This is the current hospital active medication list Current Facility-Administered Medications  Medication Dose Route Frequency Provider Last Rate Last Admin  . 0.9 %  sodium chloride infusion  250 mL Intravenous Continuous Molpus, John, MD      . acetaminophen (TYLENOL) tablet 650 mg  650 mg Oral Q6H PRN Marylyn Ishihara, Tyrone A, DO       Or  . acetaminophen (TYLENOL) suppository 650 mg  650 mg Rectal Q6H PRN Marylyn Ishihara, Tyrone A, DO      . amoxicillin-clavulanate (AUGMENTIN) 875-125 MG per tablet 1 tablet  1 tablet Oral Q12H Dessa Phi, DO   1 tablet at 09/24/20 1021  . aspirin EC tablet 81 mg  81 mg Oral Daily Shahmehdi, Seyed A, MD   81 mg at 09/24/20 1021  . atorvastatin (LIPITOR) tablet 10 mg  10 mg Oral Daily Kyle, Tyrone A, DO   10 mg at 09/24/20 1021  .  busPIRone (BUSPAR) tablet 15 mg  15 mg Oral TID Marylyn Ishihara, Tyrone A, DO   15 mg at 09/24/20 1021  . chlorhexidine (PERIDEX) 0.12 % solution 15 mL  15 mL Mouth Rinse BID Shahmehdi, Seyed A, MD   15 mL at 09/24/20 1022  . Chlorhexidine Gluconate Cloth 2 % PADS 6 each  6 each Topical Daily Kyle, Tyrone A, DO   6 each at 09/24/20 1023  . citalopram (CELEXA) tablet 20 mg  20 mg Oral Daily Dessa Phi, DO   20 mg at 09/24/20 1021  . diazepam (VALIUM) tablet 1 mg  1 mg Oral BID Marylyn Ishihara, Tyrone A, DO   1 mg at 09/24/20 1021  . docusate (COLACE) 50 MG/5ML liquid 100 mg  100 mg Oral Daily Shahmehdi, Seyed A, MD   100 mg at 09/24/20 1021  . fluticasone  (FLONASE) 50 MCG/ACT nasal spray 1 spray  1 spray Each Nare Daily Kyle, Tyrone A, DO   1 spray at 09/24/20 1023  . hydrALAZINE (APRESOLINE) injection 10 mg  10 mg Intravenous Q4H PRN Skipper Cliche A, MD   10 mg at 09/21/20 1537  . MEDLINE mouth rinse  15 mL Mouth Rinse q12n4p Shahmehdi, Seyed A, MD   15 mL at 09/23/20 1700  . metoprolol tartrate (LOPRESSOR) injection 5 mg  5 mg Intravenous Q6H PRN Skipper Cliche A, MD   5 mg at 09/21/20 0920  . metoprolol tartrate (LOPRESSOR) tablet 25 mg  25 mg Oral BID Shahmehdi, Seyed A, MD   25 mg at 09/24/20 1022  . OLANZapine (ZYPREXA) tablet 1.25 mg  1.25 mg Oral QHS Kyle, Tyrone A, DO   1.25 mg at 09/23/20 2107  . ondansetron (ZOFRAN) tablet 4 mg  4 mg Oral Q6H PRN Marylyn Ishihara, Tyrone A, DO       Or  . ondansetron (ZOFRAN) injection 4 mg  4 mg Intravenous Q6H PRN Marylyn Ishihara, Tyrone A, DO      . pantoprazole sodium (PROTONIX) 40 mg/20 mL oral suspension 40 mg  40 mg Oral Daily Shahmehdi, Seyed A, MD   40 mg at 09/24/20 1022  . polyethylene glycol (MIRALAX / GLYCOLAX) packet 17 g  17 g Oral QHS Kyle, Tyrone A, DO   17 g at 09/23/20 2108  . sucralfate (CARAFATE) tablet 1 g  1 g Oral BID Marylyn Ishihara, Tyrone A, DO   1 g at 09/24/20 0900     Discharge Medications: Please see discharge summary for a list of discharge medications.  Relevant Imaging Results:  Relevant Lab Results:   Additional Information SS[id number] [place], [place]

## 2020-09-25 NOTE — Progress Notes (Signed)
Pt will leave today via PTAR. Pt going back to [facility]. Family/POA aware. Pt is aware.

## 2020-09-25 NOTE — Progress Notes (Signed)
Writer has attempted to call report to [facility] at this time. Secretary at [employer] took my name and number; stated the [facility] would call me back.

## 2020-09-25 NOTE — Plan of Care (Signed)
  Problem: Education: Goal: Knowledge of General Education information will improve Description: Including pain rating scale, medication(s)/side effects and non-pharmacologic comfort measures Outcome: Adequate for Discharge   Problem: Clinical Measurements: Goal: Diagnostic test results will improve Outcome: Adequate for Discharge   Problem: Coping: Goal: Level of anxiety will decrease Outcome: Adequate for Discharge   Problem: Pain Managment: Goal: General experience of comfort will improve Outcome: Adequate for Discharge   Problem: Skin Integrity: Goal: Risk for impaired skin integrity will decrease Outcome: Adequate for Discharge

## 2020-09-25 NOTE — TOC Transition Note (Addendum)
Transition of Care [facility]) - CM/SW Discharge Note   Patient Details  Name: [name] MRN: [id number] Date of Birth: 1935/11/04  Transition of Care [facility]) CM/SW Contact:  Trish Mage, LCSW Phone Number: 09/25/2020, 10:08 AM   Clinical Narrative:  Patient who was stable for d/c yesterday now has results of negative COVID test, and facility is ready to receive back today.   PTAR arranged.  Nursing, please call report to [phone]. Ask for 600 hall RN.  TOC sign off.    Final next level of care: Skilled Nursing Facility Barriers to Discharge: Barriers Resolved   Patient Goals and CMS Choice Patient states their goals for this hospitalization and ongoing recovery are:: pt confused at time of assessment CMS Medicare.gov Compare Post Acute Care list provided to:: Patient Represenative (must comment) (husband) Choice offered to / list presented to : Spouse  Discharge Placement                       Discharge Plan and Services   Discharge Planning Services: CM Consult                                 Social Determinants of Health (SDOH) Interventions     Readmission Risk Interventions Readmission Risk Prevention Plan 04/28/2020  Transportation Screening Complete  PCP or Specialist Appt within 5-7 Days Not Complete  Not Complete comments SNF resident  Home Care Screening Not Complete  Home Care Screening Not Completed Comments SNF resident  Medication Review (RN CM) Referral to Pharmacy  Some recent data might be hidden

## 2020-09-25 NOTE — Progress Notes (Signed)
[facility]) is requesting to receive a copy of discharge papers for this patient. She states that she will come and pick it up.

## 2020-09-25 NOTE — Plan of Care (Signed)
Pt going back to [facility].  Paperwork has been sent via SW. Family and pt aware of transfer.

## 2020-09-25 NOTE — Clinical Social Work Note (Signed)
[place], Van #[id number] (CSN: [id number]) (85 y.o. F) (Adm: 09/19/20) [id number]  Microbiology Results  Procedure Component Value Units Date/Time  SARS CORONAVIRUS 2 (TAT 6-24 HRS) Nasopharyngeal Nasopharyngeal Swab [[id number]] Collected: 09/24/20 1241  Order Status: Completed Specimen: Nasopharyngeal Swab Updated: 09/24/20 1857   SARS Coronavirus 2 NEGATIVE   Comment: (NOTE)  SARS-CoV-2 target nucleic acids are NOT DETECTED.   The SARS-CoV-2 RNA is generally detectable in upper and lower  respiratory specimens during the acute phase of infection. Negative  results do not preclude SARS-CoV-2 infection, do not rule out  co-infections with other pathogens, and should not be used as the  sole basis for treatment or other patient management decisions.  Negative results must be combined with clinical observations,  patient history, and epidemiological information. The expected  result is Negative.   Fact Sheet for Patients:  SugarRoll.be   Fact Sheet for Healthcare Providers:  https://www.woods-mathews.com/   This test is not yet approved or cleared by the [place] FDA and  has been authorized for detection and/or diagnosis of SARS-CoV-2 by  FDA under an Emergency Use Authorization (EUA). This EUA will remain  in effect (meaning this test can be used) for the duration of the  COVID-19 declaration under Section 564(b)(1) of the Act, 21 U.S.C.  section 360bbb-3(b)(1), unless the authorization is terminated or  revoked sooner.   Performed at [facility], [place] [address], [place], [place]  [address]

## 2020-09-25 NOTE — Progress Notes (Signed)
  PROGRESS NOTE  Patient ready to DC to SNF. Covid negative. Patient voices no concerns this morning, resting comfortably in bed.    Dessa Phi, DO [facility] 09/25/2020, 8:11 AM  Available via Epic secure chat 7am-7pm After these hours, please refer to coverage provider listed on [email or web address]

## 2020-09-25 NOTE — Progress Notes (Signed)
Writer has given report to receiving nurse, Dawn, at [facility].

## 2020-09-25 NOTE — Progress Notes (Signed)
     Chart Reviewed. Updates Received.   No acute distress. Patient continues to show stability. Most likely to discharge back to facility today with continued outpatient Palliative support.   [facility]) aware of patient's progress and plans to return to facility as requested. She is appreciative of the care provided and patient's improvement/stability.   Brief review of goals of care. Education provided on continued aspiration and risk. Family has confirmed wishes for no artificial feedings and awareness of risk. Hopeful patient will continue to do well at facility with support from Palliative and SLP team in addition to staff. Family shares facility is arranging for patient to have a new roommate hoping this will bring her some comfort in the setting of her recent roommate of 40 years death. Support provided.   Plan -Patient discharging back to facility with outpatient Palliative -Family clear in expressed goals to continue to treat the treatable, hopeful for improvement, no heroic measures/artificial feedings -Currently followed by Outpatient Palliative and family wishes to continue with their support.   Time Total: 25 min.   Visit consisted of counseling and education dealing with the complex and emotionally intense issues of symptom management and palliative care in the setting of serious and potentially life-threatening illness.Greater than 50%  of this time was spent counseling and coordinating care related to the above assessment and plan.  Alda Lea, AGPCNP-BC  Palliative Medicine Team [phone]

## 2020-12-21 ENCOUNTER — Non-Acute Institutional Stay: Payer: Medicare Other | Admitting: Nurse Practitioner

## 2020-12-21 ENCOUNTER — Other Ambulatory Visit: Payer: Self-pay

## 2020-12-21 DIAGNOSIS — L409 Psoriasis, unspecified: Secondary | ICD-10-CM

## 2020-12-21 DIAGNOSIS — Z515 Encounter for palliative care: Secondary | ICD-10-CM

## 2020-12-21 DIAGNOSIS — G8929 Other chronic pain: Secondary | ICD-10-CM

## 2020-12-21 NOTE — Progress Notes (Signed)
[facility] Consult Note Telephone: [phone]  Fax: [phone]    Date of encounter: 12/21/20 PATIENT NAME: [name] [address] [place] [facility] [address]   [phone] (home)  DOB: 1936-07-10 MRN: [id number]  PRIMARY CARE PROVIDER:    Reynold Bowen, MD,  [place] [address] [phone]  REFERRING PROVIDER:   Reynold Cummings, [place],  [place] [address] [phone]  RESPONSIBLE PARTY:    Contact Information    Name Relation Home Work [place] Other [phone]  [phone]   [name] [phone]  [phone]   [name], [name] [phone]  [phone]     I met face to face with patient in her room at [facility]. Her cousin/POA [name] present during visit. Palliative Care was asked to follow this patient by consultation request of  Sharon Bowen, MD to address advance care planning and complex medical decision making. This is a follow up visit.                                  ASSESSMENT AND PLAN / RECOMMENDATIONS:   Advance Care Planning/Goals of Care:  CODE STATUS: DNR Goal of care: Patient's goal of care is comfort. Directive: Patient has a signed DNR and MOST form present on file in the facility. Copy uploaded to [facility] EMR. Details of MOST form include comfort measures, determine use or limitation of antibiotics, no IV fluids, no feeding tube. Patient reiterated desire to not be resuscitated in the event of cardiac or respiratory arrest.   Symptom Management/Plan: left knee pain: No report of trauma to left knee, no report of recent falls. X-ray of left knee completed on 12/01/2020 showed moderate osteoarthritis and knee joint effusion. Knee tender to touch. Patients current pain medication regimen include Hydrocodone-Acetaminphen 5-312m daily as need, Lidocaine patch and Biofreeze topical application as needed.  Recommendation:  Recommend stating patient on an antiinflammatory med. Consider staring Morbic 7.536mto 1563maily, consider scheduling Hydrocodone- Acetaminophen as patient may not remember to request for pain medication. Cr 0.38, GFR >90 on 10/01/2020. Discussed visit recommendation with facility provider [name]. 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 Recommend  Hydrocortisone cream to site twice a day.  Follow up Palliative Care Visit: Palliative care will continue to follow for complex medical decision making, advance care planning, and clarification of goals. Return in about 4 weeks or prn.  PPS: 30%  HOSPICE ELIGIBILITY/DIAGNOSIS: TBD  CHIEF COMPLAIN: left knee pain  History obtained from review of Epic EMR, discussion with facility staff and interview with [name] her cousin DenSharla Cummings HISTORY OF PRESENT ILLNESS:  [name] a 85 71o. year old female with multiple medical problems including cerebral palsy with lower extremities contractures, spastic paraplegia, dysphagia on a mechanical soft diet.   Patient with complaints of left knee pain in the setting of osteoarthritis. Pain is chronic but worsened in the last month. Report pain as constant and worse with movement during personal care. Patient described pain as severe. Pain associated with swelling at the knee joint and mild redness. No report of fever, chills, increased SOB, or chest pain. Ten systems reviewed and are negative for acute change, except as noted in the HPI.   I reviewed available labs, medications, imaging, studies and related documents from the EMR. Records reviewed and summarized above.   Physical Exam: General:  frail appearing, thin, lying in bed in NAD  EYES: anicteric sclera, no discharge  ENMT: intact hearing, oral mucous membranes moist CV: RRR, no LE edema Pulmonary: LCTA, no increased work of breathing, no cough, room air Abdomen: no ascites GU: deferred MSK: severe sarcopenia, limited ROM to bilateral lower  extremities, contractures, non-ambulatory Skin: warm and dry, itchy dry rough scaly patch noted to left lateral lower leg Neuro: generalized weakness, mild cognitive impairment Psych: non-anxious affect, A and O x 2 Hem/lymph/immuno: no widespread bruising  Past Medical History:  Diagnosis Date  . Cerebral palsy ([place])   . Cognitive impairment   . Constipation   . HTN (hypertension) 09/24/2020  . Hyperlipidemia    I spent 48 minutes providing this consultation, time includes time spent with patient/family, chart review, provider coordination, and documentation. More than 50% of the time in this consultation was spent counseling and coordinating communication.   Thank you for the opportunity to participate in the care of [name].  The palliative care team will continue to follow. Please call our office at [phone] if we can be of additional assistance.   Sharon Favre, DNP, AGPCNP-BC  COVID-19 PATIENT SCREENING TOOL Asked and negative response unless otherwise noted:   Have you had symptoms of covid, tested positive or been in contact with someone with symptoms/positive test in the past 5-10 days?

## 2021-01-07 ENCOUNTER — Telehealth: Payer: Self-pay | Admitting: Nurse Practitioner

## 2021-01-10 ENCOUNTER — Other Ambulatory Visit: Payer: Self-pay

## 2021-01-10 ENCOUNTER — Other Ambulatory Visit: Payer: Medicare Other | Admitting: Nurse Practitioner

## 2021-01-10 DIAGNOSIS — G894 Chronic pain syndrome: Secondary | ICD-10-CM

## 2021-01-10 DIAGNOSIS — Z515 Encounter for palliative care: Secondary | ICD-10-CM

## 2021-01-10 NOTE — Progress Notes (Signed)
[facility] Consult Note Telephone: [phone]  Fax: [phone]    Date of encounter: 01/10/21 PATIENT NAME: [name] [address] [place] [facility] [address]   [phone] (home)  DOB: 02-24-1936 MRN: [id number]  PRIMARY CARE PROVIDER:    Reynold Bowen, MD,  [place] [address] [phone]  REFERRING PROVIDER:   Reynold Bowen, [place],   [address] [phone]  RESPONSIBLE PARTY:    Contact Information     Name Relation Home Work [place] Other [phone]  [phone]   Toni Amend [phone]  [phone]   Oluwatobi, Rup[place] [phone]  [phone]      I met face to face with patient in her room at [facility] facility. Sherley Bounds one of her POA was present during visit.  Palliative Care was asked to follow this patient by consultation request of  Reynold Bowen, MD to address advance care planning and complex medical decision making. This is a follow up visit.                                   ASSESSMENT AND PLAN / RECOMMENDATIONS:   Advance Care Planning/Goals of Care:  CODE STATUS: DNR Patient's goal of care is comfort. Signed DNR and MOST forms present on file in the facility, copy present on [facility] EMR. Details of MOST form include comfort measures, determine use or limitation of antibiotics, no IV fluids, no feeding tube. Shanon Brow reiterated desire for comfort measures for patient. POA does not want aggressive treatment for patient. Validation provided. Palliative care will continue to provide support to patient, family and the medical team.    Symptom Management/Plan: Chronic pain: Left knee pain related to osteoarthritis not relieved with current regimen of Hydrocodone-Acetaminophen 5-36m at bedtime. Mobic 7.559mdaily, Lidocaine patch, and Biofreeze. Staff report day time drowsiness when Norco was scheduled twice a  day.  Plan: Discontinue Mobic 7.38m60mnd Aspirin 56m48mily. Start Tylenol 500mg83m Ibuprofen 200mg 238ming and afternoon, administer both med at the same time. Continue Hydrocodone-Acetaminophen 5-3238mg a30mdtime. Renal function as at March 2022 is within normal limits, GFR >90. No report of acute bleed. Continue Bowel regimen with Miralax 17g and Colace daily, Bisacodyl and Senokot as needed. Discussed contingency plan with patient patient's POA David aShanon Brownnise to include referral to Ortho for joint injections as patient is not a good surgical candidate given her comorbid conditions. Patient's goal of care is comfort. They verbalized understanding. Provided general support and encouragement. Questions and concerns were addressed. Family was encouraged to call with questions and/or concerns.  Follow up Palliative Care Visit: Palliative care will continue to follow for complex medical decision making, advance care planning, and clarification of goals. Return as needed.  PPS: 30%  HOSPICE ELIGIBILITY/DIAGNOSIS: TBD  CHIEF COMPLAIN: follow up on ongoing left knee  History obtained from review of Epic EMR, discussion with facility staff and interview with Ms. Aries aFuerter POA David aShanon Brownise.E. I. du PontORY OF PRESENT ILLNESS:  Sharon P RAIZEL WESOLOWSKI5 y.o.30ear old female with multiple medical problems including cerebral palsy with lower extremities contractures, spastic paraplegia, dysphagia on a mechanical soft diet.   Patient with ongoing complaints of left knee pain in the setting of osteoarthritis. Pain is ongoing and worsened in the last 2 months. Pain not relieved with current pain regimen. Patient was seen  for this complaint on 12/21/2020. She was refered to ortho for evaluation and possible steroid injections. Her POA declined the referral saying they do not want outside referral, want patient to be managed in facility. Patient report severe pain today, saying pain in her knee is all she thinks  about.  Report pain as constant and worse with movement during personal care. Patient is associated with swelling, no redness, no report of falls or trauma to knee.  No report of fever, chills, chest pain, increased SOB, or constipation. Ten systems reviewed and are negative for acute change, except as noted in the HPI.   I reviewed available labs, medications, imaging, studies and related documents from the EMR.  Records reviewed and summarized above.   Physical Exam: General: frail appearing, thin, sitting in wheelchair reading a news paper. NAD CV: RRR, no LE edema Pulmonary: LCTA, no increased work of breathing, no cough, room air MSK: severe sarcopenia, limited ROM to bilateral lower extremities, contractures, non-ambulatory Skin: warm and dry, itchy dry rough scaly patch noted to lateral left lower leg Neuro: generalized weakness, mild cognitive impairment Psych: non-anxious affect, A and O x 2 Hem/lymph/immuno: no widespread bruising  Past Medical History:  Diagnosis Date   Cerebral palsy ([facility])    Cognitive impairment    Constipation    HTN (hypertension) 09/24/2020   Hyperlipidemia     I spent 50 minutes providing this consultation. More than 50% of the time in this consultation was spent in counseling and care coordination.  Thank you for the opportunity to participate in the care of Ms. Casalino.  The palliative care team will continue to follow. Please call our office at [phone] if we can be of additional assistance.   Jari Favre, DNP, AGPCNP-BC  COVID-19 PATIENT SCREENING TOOL Asked and negative response unless otherwise noted:   Have you had symptoms of covid, tested positive or been in contact with someone with symptoms/positive test in the past 5-10 days?

## 2021-01-18 IMAGING — DX DG CHEST 1V PORT
1 series · 1 of 1 positions shown · non-contrast
Comparison: No recent priors are available for comparison

CLINICAL DATA: Choking, short of breath with decreased O2 sats

EXAM:
PORTABLE CHEST 1 VIEW

[chest ap]
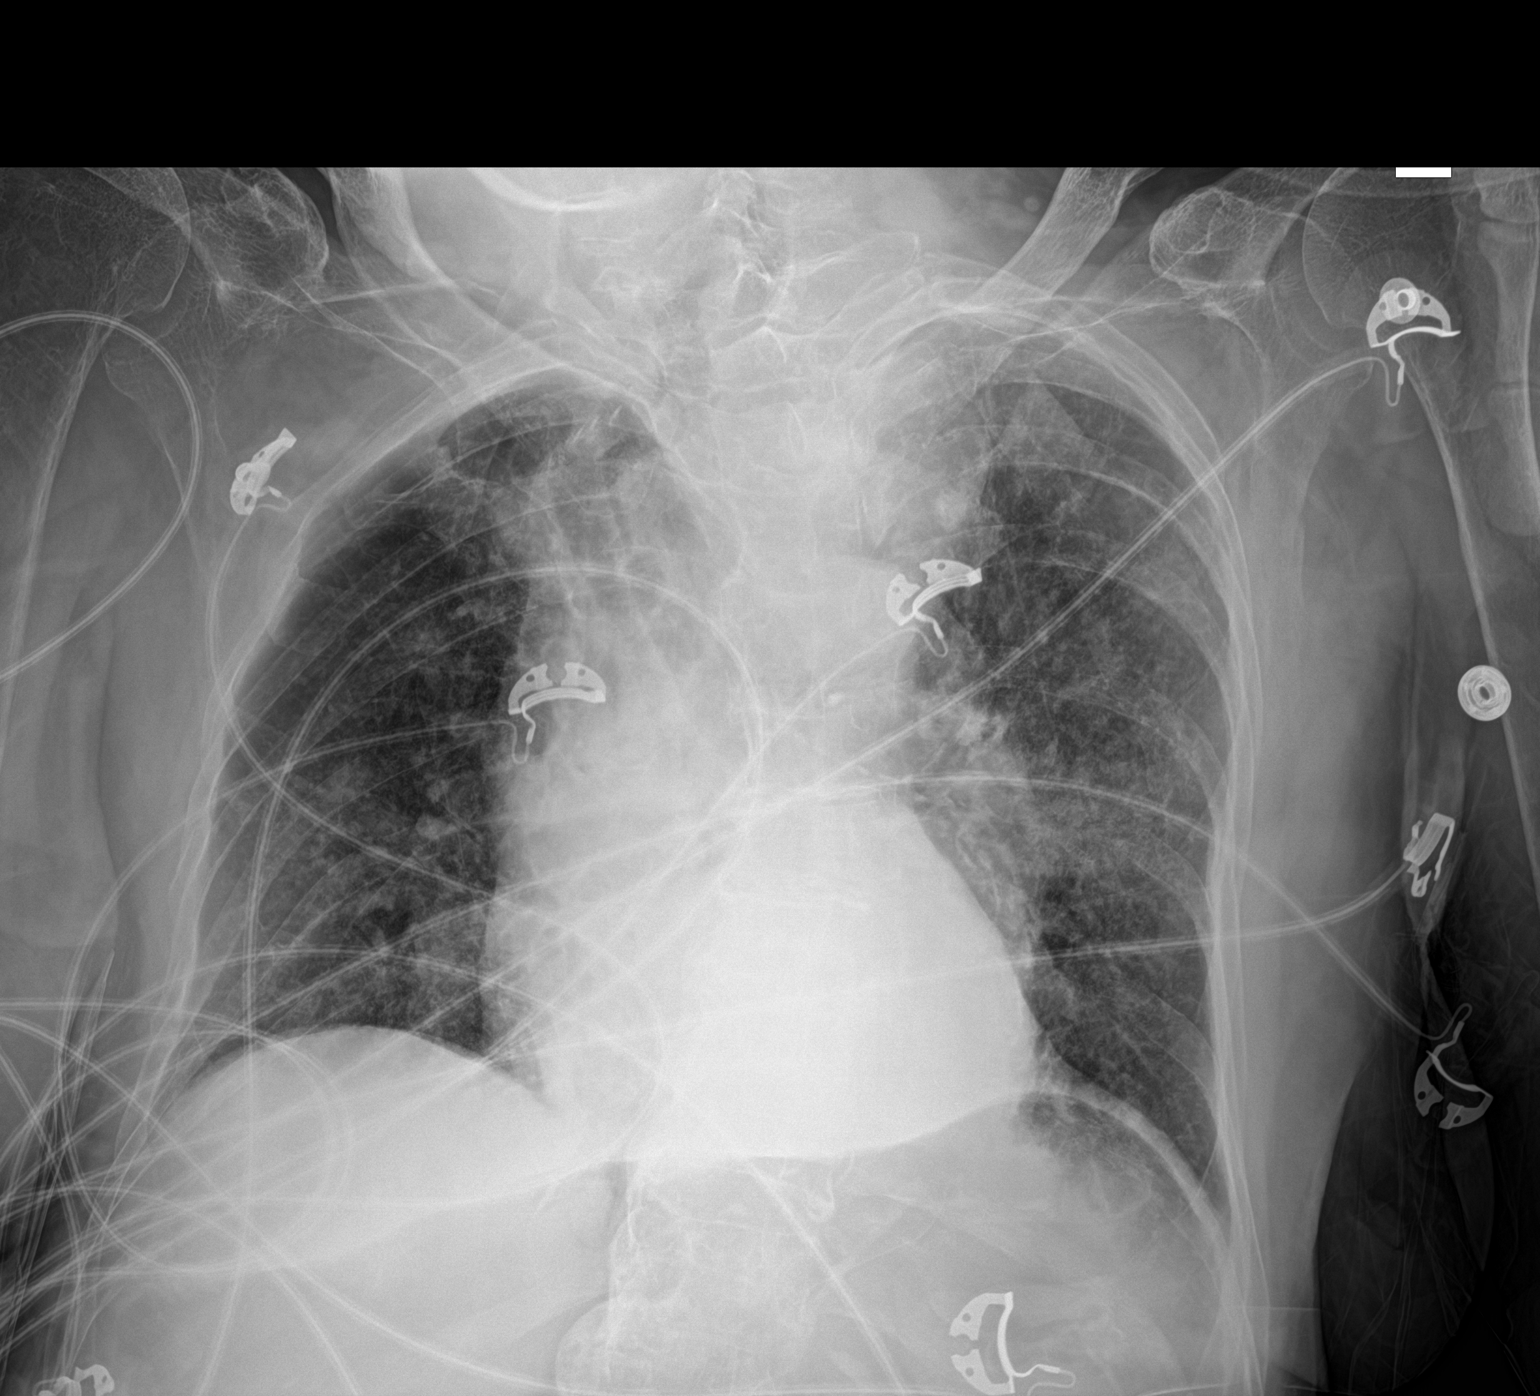

[1 of 1 positions shown; findings below may reference images not displayed]

FINDINGS: Image rotated to the RIGHT and semi erect positioning limits
assessment. Accounting for this cardiomediastinal contours are
unremarkable. There is added density at the RIGHT lung apex and
patchy opacities as well as increased interstitial markings
throughout both the RIGHT and LEFT chest. Fullness of the LEFT hilum
may relate to degree of rotation.

No sign of pleural effusion.

Lucency beneath the LEFT hemidiaphragm tracks across the midline.
There is extensive lucency beneath the LEFT hemidiaphragm.

Osteopenia without acute bone process.
IMPRESSION: 1. Question of patchy airspace opacities. There is some fullness of
the LEFT hilum. Airways not well assessed on the current study. CT
of the chest may be helpful for further assessment.
2. Interstitial and airspace markings throughout the RIGHT and LEFT
chest. This may represent edema or atypical pneumonia.
3. Findings suspicious for free air in the upper abdomen marked
gastric distension could potentially give this appearance though
this crosses the midline on the current exam. This can be further
assessed with CT.

These results were called by telephone at the time of interpretation
on 04/26/2020 at [DATE] to provider CILITO GONSALES , who
verbally acknowledged these results.

## 2021-01-20 ENCOUNTER — Non-Acute Institutional Stay: Payer: Medicare Other | Admitting: Nurse Practitioner

## 2021-01-20 ENCOUNTER — Other Ambulatory Visit: Payer: Self-pay

## 2021-01-20 DIAGNOSIS — G8929 Other chronic pain: Secondary | ICD-10-CM

## 2021-01-20 DIAGNOSIS — M25562 Pain in left knee: Secondary | ICD-10-CM

## 2021-01-20 DIAGNOSIS — Z515 Encounter for palliative care: Secondary | ICD-10-CM

## 2021-01-20 NOTE — Progress Notes (Signed)
[facility] Consult Note Telephone: [phone]  Fax: [phone]    Date of encounter: 01/20/21 PATIENT NAME: [name] [address] [place] [facility] [address]   [phone] (home)  DOB: [birth year] MRN: [id number]  PRIMARY CARE PROVIDER:    Reynold Bowen, MD,  [place] [address] [phone]  REFERRING PROVIDER:   Reynold Bowen, [place],  [place] [address] [phone]  RESPONSIBLE PARTY:    Contact Information     Name Relation Home Work [place] Other [phone]  [phone]   Toni Amend [phone]  [phone]   Cherisse, Carrell [phone]  [phone]     I met face to face with patient in facility.  Palliative Care was asked to follow this patient by consultation request of  Reynold Bowen, MD to address advance care planning and complex medical decision making. This is a follow up visit.                                  ASSESSMENT AND PLAN / RECOMMENDATIONS:   Advance Care Planning/Goals of Care:  CODE STATUS: DNR Goal of care: Patient's goal of care is comfort. Directives: Signed DNR and MOST forms present on file in the facility, copies on [facility] EMR. Details of MOST form include comfort measures, determine use or limitation of antibiotics, no IV fluids, no feeding tube.  Symptom Management/Plan: Chronic Pain left knee pain: Condition is improved. Continue current regimen with Tylenol 533m and Ibuprofen 2091mby mouth daily in the morning and afternoon. Continue Hydrocodone-Acetaminophen 5-32528mn tablet daily at bedtime. No report of constipation, continue bowel regimen with Miralax 17g and Colace daily, Bisacodyl and Senokot as needed. Continue supportive care. Routine monitor ing of renal function recommended due to prolong NSAID use. Provided general support and encouragement. I called POA Denise with visit update, voice  message left.  Follow up Palliative Care Visit: Palliative care will continue to follow for complex medical decision making, advance care planning, and clarification of goals. Return as needed.  PPS: 40% weak  HOSPICE ELIGIBILITY/DIAGNOSIS: TBD  CHIEF COMPLAIN: Palliative care follow up visit for chronic left knee pain  History obtained from review of Epic EMR, discussion with facility staff, and interview with Ms. Hollabaugh.   HISTORY OF PRESENT ILLNESS:  [name] a [age]o. year old female with multiple medical problems including cerebral palsy with lower extremities contractures, spastic paraplegia, dysphagia on a mechanical soft diet, osteoarthritis.  Patient seen today for follow up on chronic left knee pain in the context of osteoarthritis. Patient noted today sitting in her wheelchair in the living area participating in [place] [employer]. Patient report feeling well today, she denied any pain. Report improved pain control in the last week. Spoke with Nurse AshCaryl Pinaorroborated patient's report of good pain control, report improved function. Ten systems reviewed and are negative for acute change, except as noted in the HPI.   I reviewed available labs, medications, imaging, studies and related documents from the EMR.  Records reviewed and summarized above.   Physical Exam: General: frail appearing, thin, sitting in wheelchair in NAD CV: RRR, no LE edema Pulmonary: LCTA, no increased work of breathing, no cough, room air MSK: severe sarcopenia, limited ROM to bilateral lower extremities, contractures, non-ambulatory Skin: warm and dry Neuro: generalized weakness, mild cognitive impairment Psych: non-anxious affect, A and O x 2  Hem/lymph/immuno: no widespread bruising  Past Medical History:  Diagnosis Date   Cerebral palsy ([place])    Cognitive impairment    Constipation    HTN (hypertension) 09/24/2020   Hyperlipidemia     Thank you for the opportunity to participate in the care  of Ms. Kenney. The palliative care team will continue to follow. Please call our office at [phone] if we can be of additional assistance.   Jari Favre, DNP, AGPCNP-BC  COVID-19 PATIENT SCREENING TOOL Asked and negative response unless otherwise noted:   Have you had symptoms of covid, tested positive or been in contact with someone with symptoms/positive test in the past 5-10 days?

## 2021-01-23 IMAGING — DX DG CHEST 2V
2 series · 2 of 2 positions shown · non-contrast
Comparison: 04/27/2020 chest radiograph.

CLINICAL DATA: Pneumonia

EXAM:
CHEST - 2 VIEW

[chest lat]
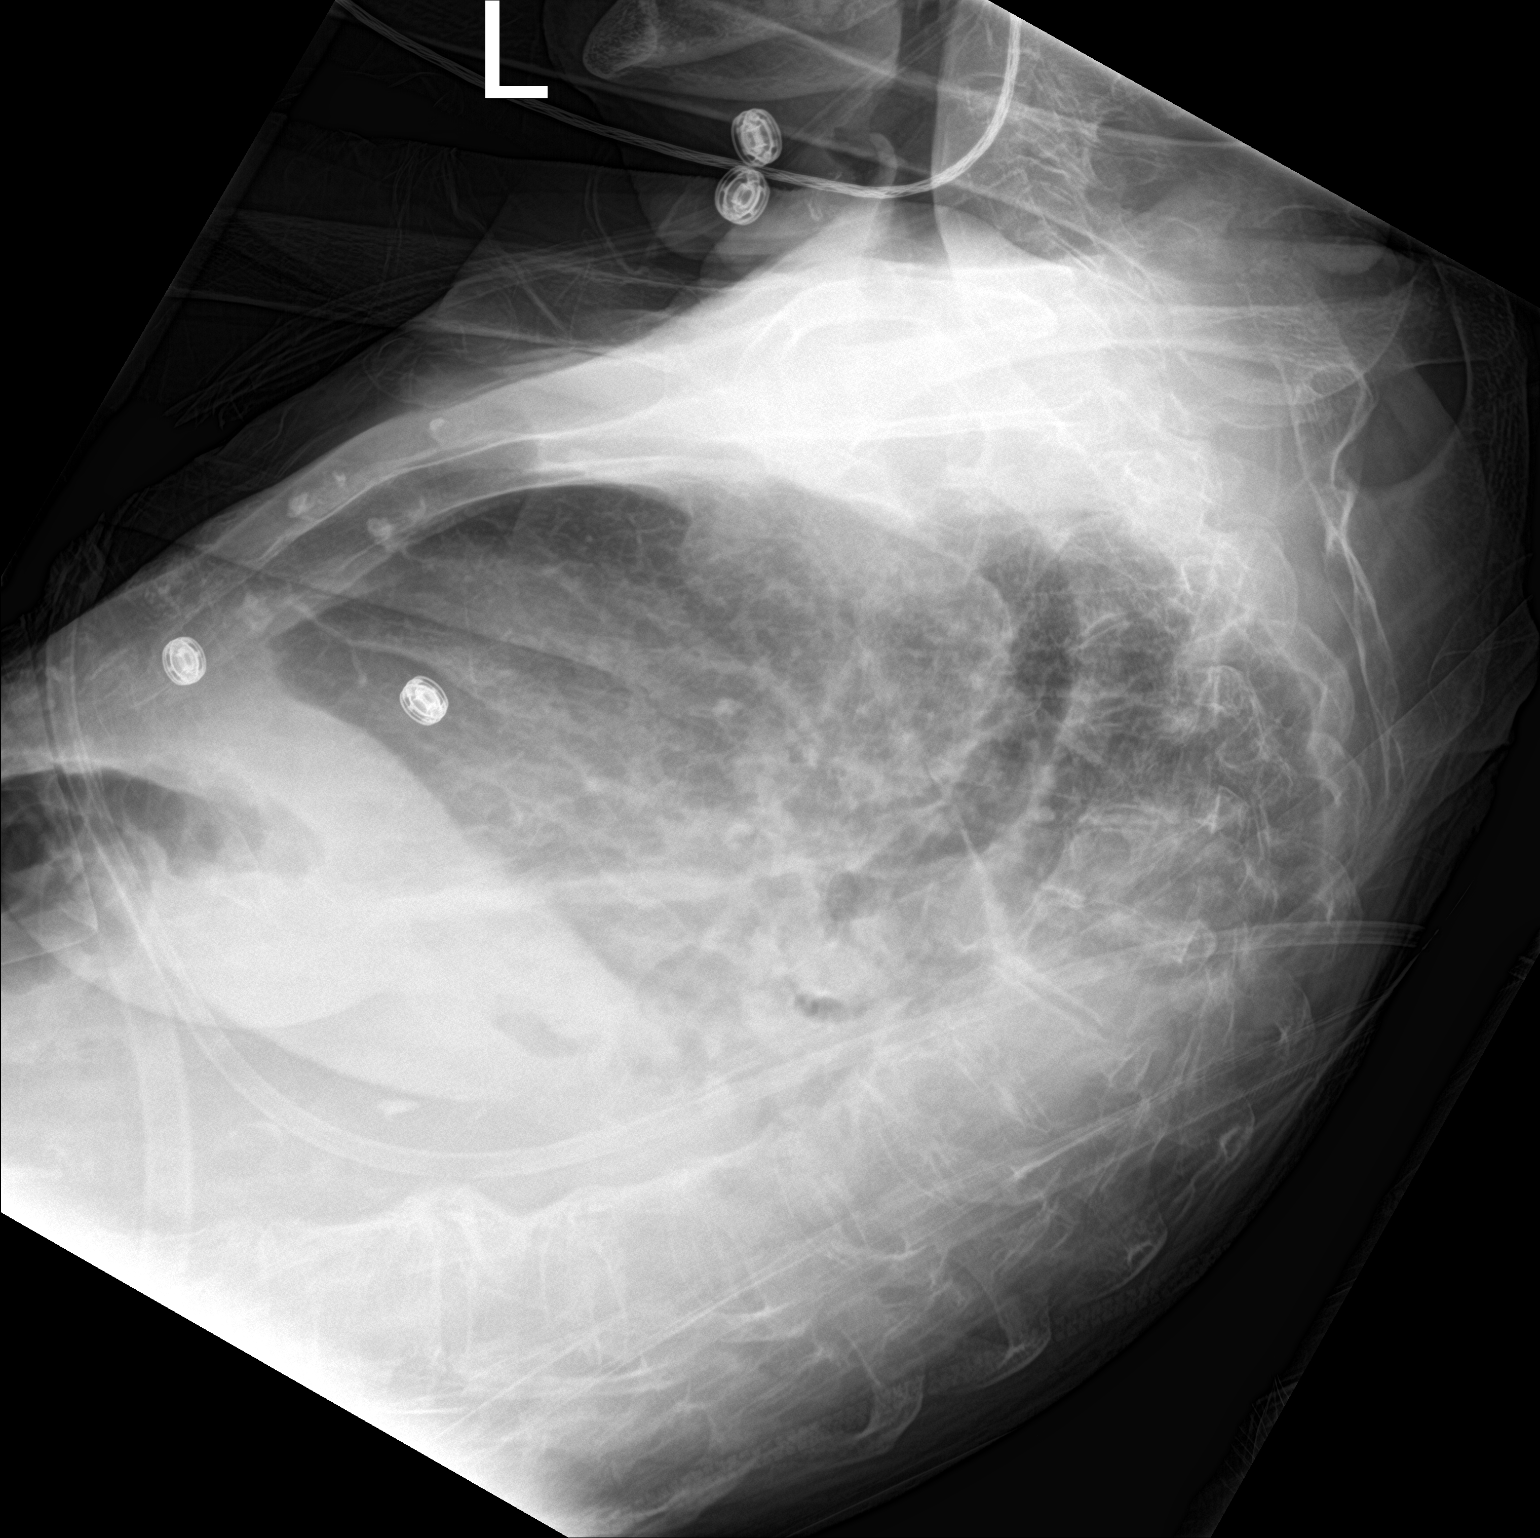

[chest ap]
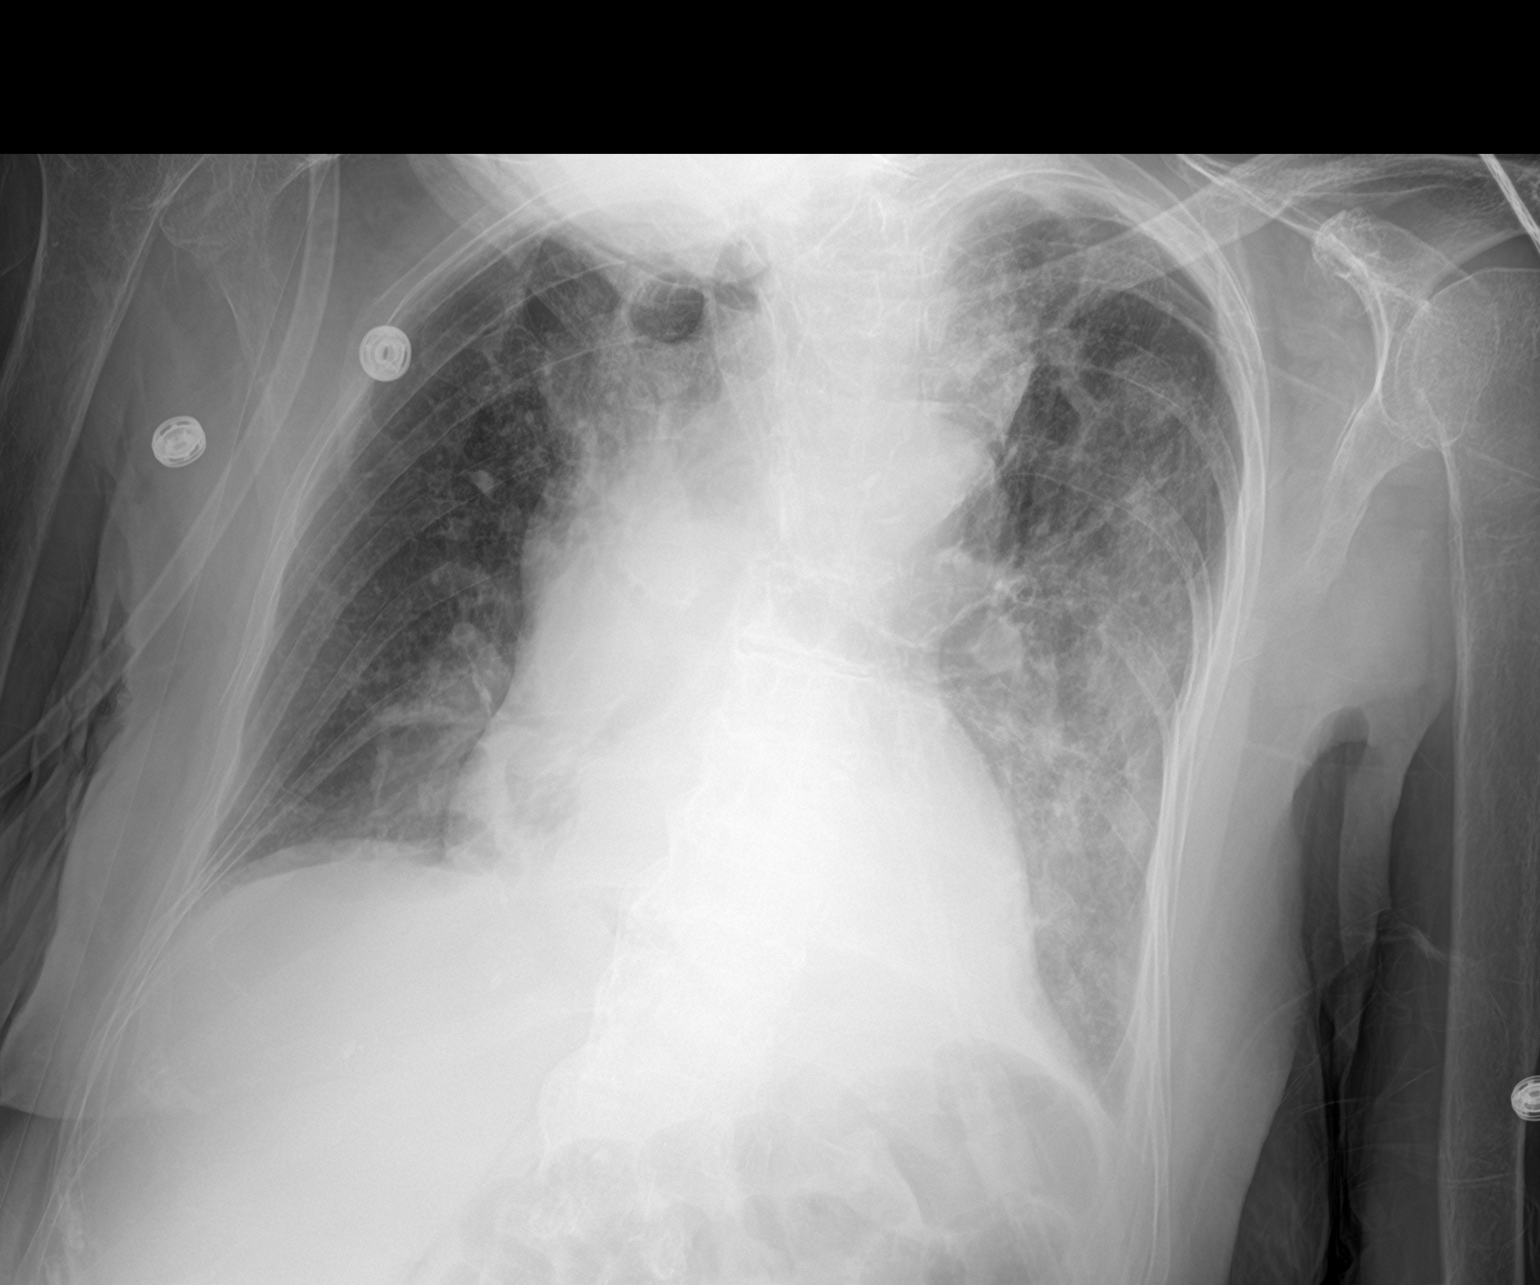

[2 of 2 positions shown; findings below may reference images not displayed]

FINDINGS: Stable cardiomediastinal silhouette with mild cardiomegaly. No
pneumothorax. Small left pleural effusion appears slightly
increased. No right pleural effusion. Dense patchy bilateral lower
lobe consolidation appears worsened. Hazy left parahilar lung
opacity appears slightly increased.
IMPRESSION: 1. Worsening dense patchy bilateral lower lobe consolidation and
hazy left parahilar lung opacity, compatible with worsening
multilobar pneumonia.
2. Small left pleural effusion appears slightly increased.

## 2021-01-25 IMAGING — DX DG CHEST 1V PORT
1 series · 1 of 1 positions shown · non-contrast
Comparison: 05/01/2020

CLINICAL DATA: Hypoxia, no chest complaints

EXAM:
PORTABLE CHEST 1 VIEW

[chest]
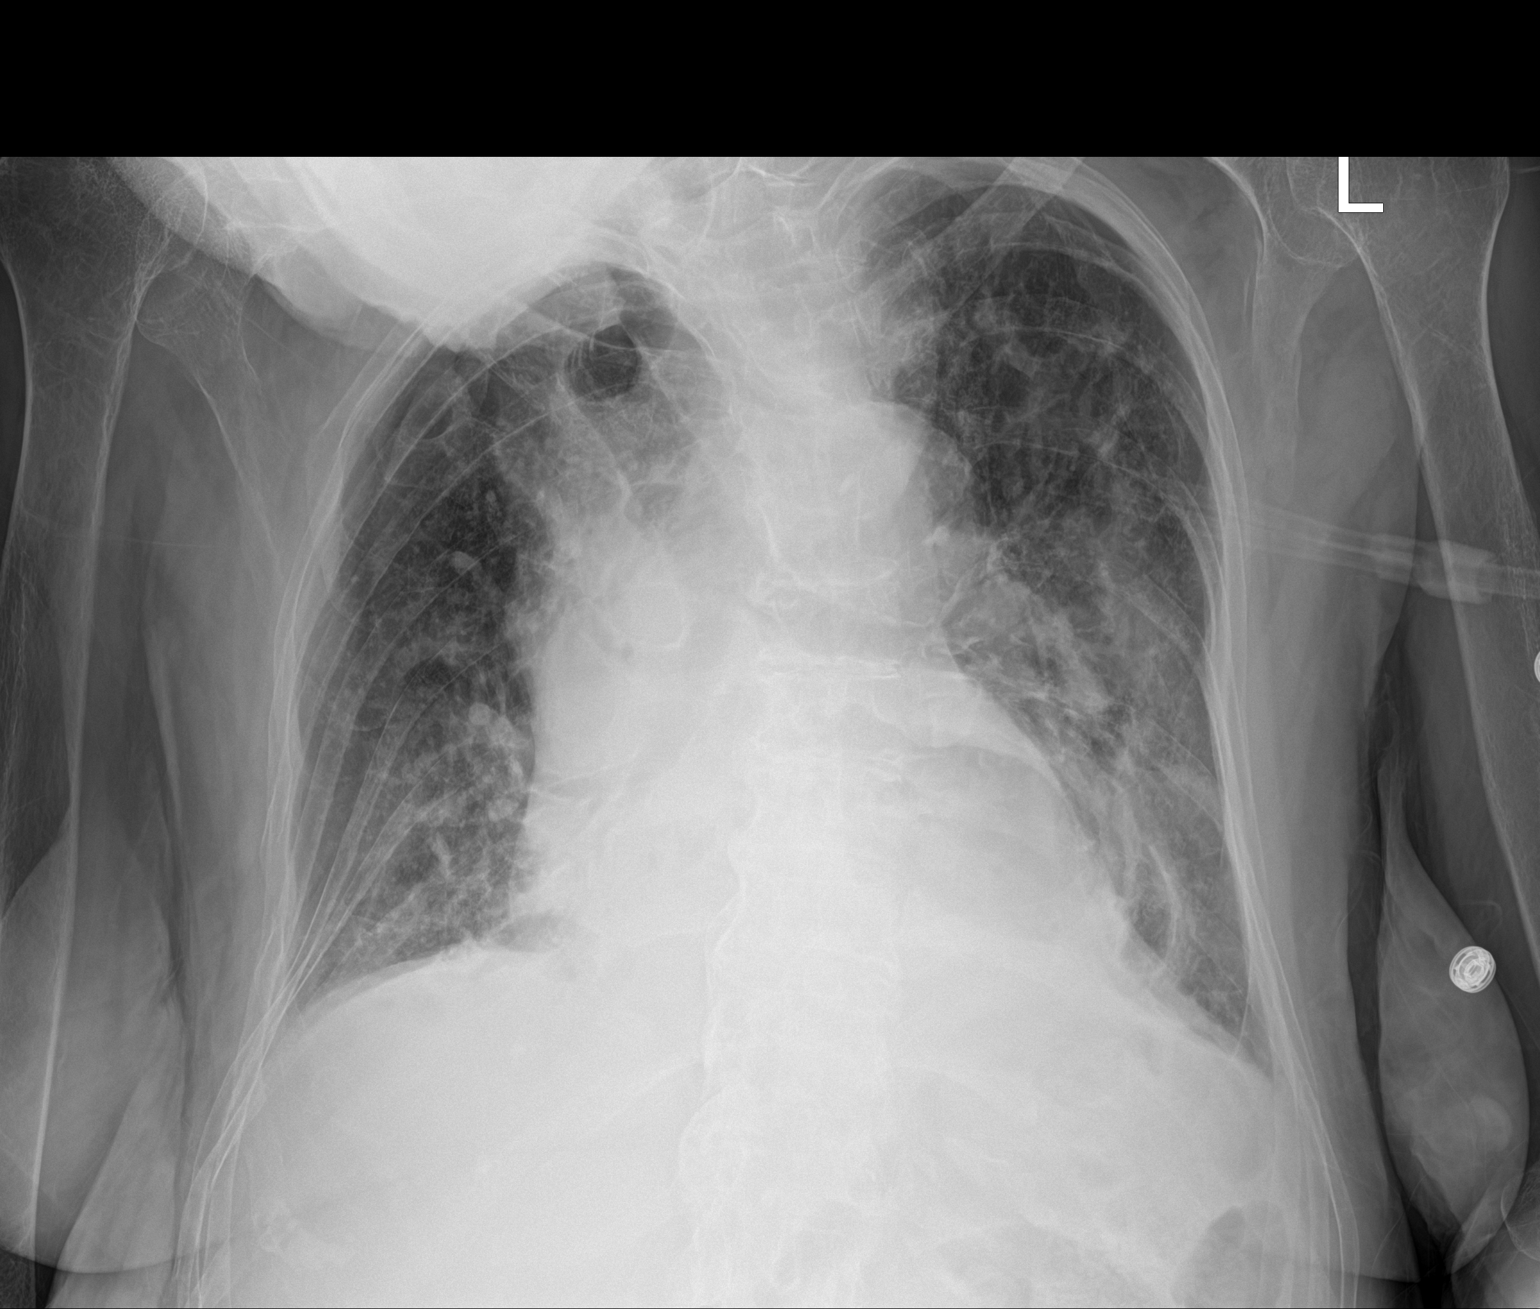

[1 of 1 positions shown; findings below may reference images not displayed]

FINDINGS: Bilateral lower lobe airspace disease. Patchy left upper lobe
airspace disease. No pleural effusion or pneumothorax. Stable
cardiomegaly. No acute osseous abnormality.
IMPRESSION: Bilateral lower lobe and left upper lobe airspace disease concerning
for stable multilobar pneumonia.

## 2021-01-31 ENCOUNTER — Emergency Department (HOSPITAL_COMMUNITY): Payer: Medicare Other

## 2021-01-31 ENCOUNTER — Inpatient Hospital Stay (HOSPITAL_COMMUNITY)
Admission: EM | Admit: 2021-01-31 | Discharge: 2021-02-02 | DRG: 871 | Disposition: A | Discharge status: Nursing Home (Includes ICF) | Payer: Medicare Other | Source: Skilled Nursing Facility | Attending: Internal Medicine | Admitting: Internal Medicine

## 2021-01-31 DIAGNOSIS — N390 Urinary tract infection, site not specified: Secondary | ICD-10-CM | POA: Diagnosis present

## 2021-01-31 DIAGNOSIS — I10 Essential (primary) hypertension: Secondary | ICD-10-CM | POA: Diagnosis present

## 2021-01-31 DIAGNOSIS — F419 Anxiety disorder, unspecified: Secondary | ICD-10-CM | POA: Diagnosis present

## 2021-01-31 DIAGNOSIS — A419 Sepsis, unspecified organism: Principal | ICD-10-CM | POA: Diagnosis present

## 2021-01-31 DIAGNOSIS — J9811 Atelectasis: Secondary | ICD-10-CM | POA: Diagnosis present

## 2021-01-31 DIAGNOSIS — Z79899 Other long term (current) drug therapy: Secondary | ICD-10-CM

## 2021-01-31 DIAGNOSIS — E785 Hyperlipidemia, unspecified: Secondary | ICD-10-CM | POA: Diagnosis present

## 2021-01-31 DIAGNOSIS — G894 Chronic pain syndrome: Secondary | ICD-10-CM | POA: Diagnosis present

## 2021-01-31 DIAGNOSIS — K449 Diaphragmatic hernia without obstruction or gangrene: Secondary | ICD-10-CM | POA: Diagnosis present

## 2021-01-31 DIAGNOSIS — R4189 Other symptoms and signs involving cognitive functions and awareness: Secondary | ICD-10-CM | POA: Diagnosis present

## 2021-01-31 DIAGNOSIS — Z8701 Personal history of pneumonia (recurrent): Secondary | ICD-10-CM

## 2021-01-31 DIAGNOSIS — G809 Cerebral palsy, unspecified: Secondary | ICD-10-CM | POA: Diagnosis present

## 2021-01-31 DIAGNOSIS — J9601 Acute respiratory failure with hypoxia: Secondary | ICD-10-CM | POA: Diagnosis present

## 2021-01-31 DIAGNOSIS — E876 Hypokalemia: Secondary | ICD-10-CM | POA: Diagnosis present

## 2021-01-31 DIAGNOSIS — Z20822 Contact with and (suspected) exposure to covid-19: Secondary | ICD-10-CM | POA: Diagnosis present

## 2021-01-31 DIAGNOSIS — Z66 Do not resuscitate: Secondary | ICD-10-CM | POA: Diagnosis present

## 2021-01-31 DIAGNOSIS — Z881 Allergy status to other antibiotic agents status: Secondary | ICD-10-CM

## 2021-01-31 DIAGNOSIS — K219 Gastro-esophageal reflux disease without esophagitis: Secondary | ICD-10-CM | POA: Diagnosis present

## 2021-01-31 DIAGNOSIS — Z87440 Personal history of urinary (tract) infections: Secondary | ICD-10-CM

## 2021-01-31 LAB — CBC WITH DIFFERENTIAL/PLATELET
Abs Immature Granulocytes: 0.06 10*3/uL (ref 0.00–0.07)
Basophils Absolute: 0.1 10*3/uL (ref 0.0–0.1)
Basophils Relative: 0 %
Eosinophils Absolute: 0.2 10*3/uL (ref 0.0–0.5)
Eosinophils Relative: 1 %
HCT: 38.3 % (ref 36.0–46.0)
Hemoglobin: 11.9 g/dL — ABNORMAL LOW (ref 12.0–15.0)
Immature Granulocytes: 0 %
Lymphocytes Relative: 8 %
Lymphs Abs: 1.1 10*3/uL (ref 0.7–4.0)
MCH: 27.4 pg (ref 26.0–34.0)
MCHC: 31.1 g/dL (ref 30.0–36.0)
MCV: 88.2 fL (ref 80.0–100.0)
Monocytes Absolute: 1.3 10*3/uL — ABNORMAL HIGH (ref 0.1–1.0)
Monocytes Relative: 9 %
Neutro Abs: 11.5 10*3/uL — ABNORMAL HIGH (ref 1.7–7.7)
Neutrophils Relative %: 82 %
Platelets: 226 10*3/uL (ref 150–400)
RBC: 4.34 MIL/uL (ref 3.87–5.11)
RDW: 15.9 % — ABNORMAL HIGH (ref 11.5–15.5)
WBC: 14.2 10*3/uL — ABNORMAL HIGH (ref 4.0–10.5)
nRBC: 0 % (ref 0.0–0.2)

## 2021-01-31 LAB — COMPREHENSIVE METABOLIC PANEL
ALT: 12 U/L (ref 0–44)
AST: 14 U/L — ABNORMAL LOW (ref 15–41)
Albumin: 3 g/dL — ABNORMAL LOW (ref 3.5–5.0)
Alkaline Phosphatase: 69 U/L (ref 38–126)
Anion gap: 7 (ref 5–15)
BUN: 11 mg/dL (ref 8–23)
CO2: 24 mmol/L (ref 22–32)
Calcium: 8.3 mg/dL — ABNORMAL LOW (ref 8.9–10.3)
Chloride: 104 mmol/L (ref 98–111)
Creatinine, Ser: 0.51 mg/dL (ref 0.44–1.00)
GFR, Estimated: >60 mL/min (ref >60)
Glucose, Bld: 117 mg/dL — ABNORMAL HIGH (ref 70–99)
Potassium: 3.2 mmol/L — ABNORMAL LOW (ref 3.5–5.1)
Sodium: 135 mmol/L (ref 135–145)
Total Bilirubin: 0.9 mg/dL (ref 0.3–1.2)
Total Protein: 6.1 g/dL — ABNORMAL LOW (ref 6.5–8.1)

## 2021-01-31 LAB — RESP PANEL BY RT-PCR (FLU A&B, COVID) ARPGX2
Influenza A by PCR: NEGATIVE
Influenza B by PCR: NEGATIVE
SARS Coronavirus 2 by RT PCR: NEGATIVE

## 2021-01-31 LAB — LIPASE, BLOOD: Lipase: 25 U/L (ref 11–51)

## 2021-01-31 LAB — LACTIC ACID, PLASMA: Lactic Acid, Venous: 0.8 mmol/L (ref 0.5–1.9)

## 2021-01-31 MED ORDER — LACTATED RINGERS IV BOLUS (SEPSIS)
1000.0000 mL | Freq: Once | INTRAVENOUS | Status: AC
  Administered 2021-02-01: 1000 mL via INTRAVENOUS

## 2021-01-31 MED ORDER — IOHEXOL 300 MG/ML  SOLN
100.0000 mL | Freq: Once | INTRAMUSCULAR | Status: AC | PRN
  Administered 2021-01-31: 100 mL via INTRAVENOUS

## 2021-01-31 MED ORDER — VANCOMYCIN HCL 1000 MG/200ML IV SOLN
1000.0000 mg | Freq: Once | INTRAVENOUS | Status: AC
  Administered 2021-02-01: 1000 mg via INTRAVENOUS
  Filled 2021-01-31: qty 200

## 2021-01-31 MED ORDER — METRONIDAZOLE 500 MG/100ML IV SOLN
500.0000 mg | Freq: Once | INTRAVENOUS | Status: AC
  Administered 2021-02-01: 500 mg via INTRAVENOUS
  Filled 2021-01-31: qty 100

## 2021-01-31 MED ORDER — LACTATED RINGERS IV SOLN: INTRAVENOUS | Status: DC

## 2021-01-31 MED ORDER — LACTATED RINGERS IV BOLUS (SEPSIS)
500.0000 mL | Freq: Once | INTRAVENOUS | Status: AC
  Administered 2021-02-01: 500 mL via INTRAVENOUS

## 2021-01-31 MED ORDER — SODIUM CHLORIDE 0.9 % IV SOLN
2.0000 g | Freq: Once | INTRAVENOUS | Status: AC
  Administered 2021-02-01: 2 g via INTRAVENOUS
  Filled 2021-01-31: qty 2

## 2021-01-31 NOTE — ED Triage Notes (Signed)
Pt BIB EMS from [facility]. Per EMS pt has had fever throughout the day - pt has been given tylenol but fever remains. Temp with EMS 101.8. EMS also reports that pt had an episode of vomiting earlier in the day. Pt was tachypnic with EMS 88% on RA - started on 3L with improvement to 96%/. Pt had blood work drawn at [facility]	[place] today with WBC 14.8 VS with EMS  124/64 HR 94 RR 28 CBG 135 18 LFA - EMS gave 500 of fluid

## 2021-01-31 NOTE — ED Notes (Signed)
Per Dr. Regenia Skeeter in and out patient. This RN and [facility] in and out pt with no output. Pt placed on purewick and Dr. Regenia Skeeter notified.

## 2021-01-31 NOTE — ED Provider Notes (Signed)
Care assumed from Dr. Julieanne Manson.  Please see his full H&P.  In short,  [name] is a 85 y.o. female with a history of cerebral palsy, cognitive impairment, hypertension and hyperlipidemia presents to the emergency department from [facility] facility with complaints of feeling bad to her stomach.  She reports to the initial provider periumbilical abdominal pain without aggravating or alleviating factors.  On my exam, patient complaining of several days of cough and lower abdominal pain with nausea and vomiting onset several days ago.  Reports she is previously been hospitalized for urinary tract infections.   Physical Exam  BP (!) 142/96   Pulse 81   Temp (!) 101.4 F (38.6 C) (Oral)   Resp 19   Ht 5\' 2"  (1.575 m)   Wt 45.4 kg   SpO2 90%   BMI 18.29 kg/m   Physical Exam Vitals and nursing note reviewed.  Constitutional:      General: She is not in acute distress.    Appearance: She is well-developed. She is not ill-appearing.  HENT:     Head: Normocephalic.     Nose: Nose normal.  Eyes:     General: No scleral icterus.    Conjunctiva/sclera: Conjunctivae normal.  Cardiovascular:     Rate and Rhythm: Normal rate.  Pulmonary:     Effort: Pulmonary effort is normal. Tachypnea present.  Abdominal:     General: There is no distension.     Tenderness: There is abdominal tenderness in the suprapubic area. There is no right CVA tenderness, left CVA tenderness, guarding or rebound.  Musculoskeletal:        General: Normal range of motion.     Cervical back: Normal range of motion.     Comments: Legs are contracted No cellulitis of the extremities  Skin:    General: Skin is warm and dry.     Comments: Warm to touch  Neurological:     Mental Status: She is alert.  Psychiatric:        Mood and Affect: Mood normal.    ED Course/Procedures   Clinical Course as of 02/01/21 0250  Tue Feb 01, 2021  0100 Hemoglobin(!): 11.9 baseline [HM]  0102 Lactic Acid, Venous:  0.8 WNL [HM]    Clinical Course User Index [HM] [employer], Gwenlyn Perking    Plan: sepsis work-up and admission  Results for orders placed or performed during the hospital encounter of 01/31/21  Resp Panel by RT-PCR (Flu A&B, Covid) Nasopharyngeal Swab   Specimen: Nasopharyngeal Swab; Nasopharyngeal(NP) swabs in vial transport medium  Result Value Ref Range   SARS Coronavirus 2 by RT PCR NEGATIVE NEGATIVE   Influenza A by PCR NEGATIVE NEGATIVE   Influenza B by PCR NEGATIVE NEGATIVE  CBC with Differential  Result Value Ref Range   WBC 14.2 (H) 4.0 - 10.5 K/uL   RBC 4.34 3.87 - 5.11 MIL/uL   Hemoglobin 11.9 (L) 12.0 - 15.0 g/dL   HCT 38.3 36.0 - 46.0 %   MCV 88.2 80.0 - 100.0 fL   MCH 27.4 26.0 - 34.0 pg   MCHC 31.1 30.0 - 36.0 g/dL   RDW 15.9 (H) 11.5 - 15.5 %   Platelets 226 150 - 400 K/uL   nRBC 0.0 0.0 - 0.2 %   Neutrophils Relative % 82 %   Neutro Abs 11.5 (H) 1.7 - 7.7 K/uL   Lymphocytes Relative 8 %   Lymphs Abs 1.1 0.7 - 4.0 K/uL   Monocytes Relative 9 %  Monocytes Absolute 1.3 (H) 0.1 - 1.0 K/uL   Eosinophils Relative 1 %   Eosinophils Absolute 0.2 0.0 - 0.5 K/uL   Basophils Relative 0 %   Basophils Absolute 0.1 0.0 - 0.1 K/uL   Immature Granulocytes 0 %   Abs Immature Granulocytes 0.06 0.00 - 0.07 K/uL  Comprehensive metabolic panel  Result Value Ref Range   Sodium 135 135 - 145 mmol/L   Potassium 3.2 (L) 3.5 - 5.1 mmol/L   Chloride 104 98 - 111 mmol/L   CO2 24 22 - 32 mmol/L   Glucose, Bld 117 (H) 70 - 99 mg/dL   BUN 11 8 - 23 mg/dL   Creatinine, Ser 0.51 0.44 - 1.00 mg/dL   Calcium 8.3 (L) 8.9 - 10.3 mg/dL   Total Protein 6.1 (L) 6.5 - 8.1 g/dL   Albumin 3.0 (L) 3.5 - 5.0 g/dL   AST 14 (L) 15 - 41 U/L   ALT 12 0 - 44 U/L   Alkaline Phosphatase 69 38 - 126 U/L   Total Bilirubin 0.9 0.3 - 1.2 mg/dL   GFR, Estimated >60 >60 mL/min   Anion gap 7 5 - 15  Lipase, blood  Result Value Ref Range   Lipase 25 11 - 51 U/L  Lactic acid, plasma  Result  Value Ref Range   Lactic Acid, Venous 0.8 0.5 - 1.9 mmol/L   CT ABDOMEN PELVIS W CONTRAST  Result Date: 01/31/2021 CLINICAL DATA:  Abdominal pain, fever EXAM: CT ABDOMEN AND PELVIS WITH CONTRAST TECHNIQUE: Multidetector CT imaging of the abdomen and pelvis was performed using the standard protocol following bolus administration of intravenous contrast. CONTRAST:  15mL OMNIPAQUE IOHEXOL 300 MG/ML  SOLN COMPARISON:  CT abdomen pelvis 09/19/2020. FINDINGS: Lower chest: Partially visualized large hiatal hernia containing the majority of the gastric lumen. Associated passive atelectasis of the left lower lobe. Right lower lobe bronchiectasis and bronchial wall thickening. Similar-appearing calcification at the right base. Associated nodular like 2.8 x 1.1 cm subpleural density (3:9). Hepatobiliary: No focal liver abnormality. No gallstones, gallbladder wall thickening, or pericholecystic fluid. No biliary dilatation. Pancreas: No focal lesion. Normal pancreatic contour. No surrounding inflammatory changes. No main pancreatic ductal dilatation. Spleen: Normal in size without focal abnormality. Adrenals/Urinary Tract: No adrenal nodule bilaterally. Bilateral kidneys enhance symmetrically. 2.8 cm fluid density lesion within left kidney likely represents a simple renal cyst. Subcentimeter hypodensities are too small to characterize. No hydronephrosis. No hydroureter. The urinary bladder is unremarkable. On delayed imaging, there is no urothelial wall thickening and there are no filling defects in the opacified portions of the bilateral collecting systems or ureters. Stomach/Bowel: Stomach is within normal limits. No evidence of bowel wall thickening or dilatation. Appendix appears normal. Vascular/Lymphatic: No abdominal aorta or iliac aneurysm. Mild atherosclerotic plaque of the aorta and its branches. No abdominal, pelvic, or inguinal lymphadenopathy. Reproductive: Uterus and bilateral adnexa are unremarkable. Other:  No intraperitoneal free fluid. No intraperitoneal free gas. No organized fluid collection. Musculoskeletal: No abdominal wall hernia or abnormality. Diffusely decreased bone density. No suspicious lytic or blastic osseous lesions. No acute displaced fracture. Multilevel degenerative changes of the spine. Right femoral neck surgical hardware fusion. IMPRESSION: 1. Partially visualized large hiatal hernia containing the majority of the gastric lumen. 2. Otherwise no acute intra-abdominal or intrapelvic abnormality. Electronically Signed   By: Iven Finn M.D.   On: 01/31/2021 23:01   DG Chest Portable 1 View  Result Date: 01/31/2021 CLINICAL DATA:  Sepsis, febrile, tachypneic. EXAM: PORTABLE CHEST 1  VIEW COMPARISON:  Chest x-ray 09/21/2020, CT chest 09/19/2020 FINDINGS: The heart size and mediastinal contours are unchanged. No focal consolidation. No pulmonary edema. No pleural effusion. No pneumothorax. No acute osseous abnormality. IMPRESSION: No active disease. Electronically Signed   By: Iven Finn M.D.   On: 01/31/2021 20:35     Procedures  MDM    Patient presents with fever, tachypnea, hypoxia, nausea and vomiting.  High concern for sepsis, likely urosepsis.  Chest x-ray without evidence of pneumonia.  COVID-negative.  Code sepsis initiated by myself along with fluids and antibiotics.  She will be admitted. Maintains oxygen saturation in low 90's with 3 L via [place].   2:50 AM Urine with evidence of urinary tract infection.  Urosepsis.  Discussed with Dr. Myna Hidalgo who will admit.  BP (!) 126/108   Pulse 82   Temp 99.3 F (37.4 C) (Oral)   Resp (!) 24   Ht 5\' 2"  (1.575 m)   Wt 45.4 kg   SpO2 95%   BMI 18.29 kg/m         Abigail Butts, PA-C 02/01/21 0254    Horton, Barbette Hair, MD 02/02/21 [phone]

## 2021-01-31 NOTE — ED Provider Notes (Signed)
[name] Provider Note   CSN: [id number] Arrival date & time: 01/31/21  1911     History Chief Complaint  Patient presents with   Sepsis    Sepsis, Febrile, tachypnic     [name] is a 85 y.o. female.  HPI  85yoF pmhx cerebral palsy, cognitive impairment, hiatal hernia, htn, hld, BIBA from [place] d/t concern for sepsis. States woke up today "feeling bad in my stomach." Reports periumbilical abdominal discomfort, radiates diffusely, "full" quality, no clear improving or worsening factors. Associated sx including fevers, chills, nv. No further medical concerns at this time, including cough, SOB, CP, changes to BM, dysuria, syncope, HA.     Past Medical History:  Diagnosis Date   Cerebral palsy ([place])    Cognitive impairment    Constipation    HTN (hypertension) 09/24/2020   Hyperlipidemia     Patient Active Problem List   Diagnosis Date Noted   Cognitive impairment    Sepsis secondary to UTI ([place]) 09/24/2020   HTN (hypertension) 09/24/2020   AF (paroxysmal atrial fibrillation) ([place]) 09/24/2020   HLD (hyperlipidemia) 09/24/2020   Anxiety 09/24/2020   Thyroid nodule 09/24/2020   Sepsis ([place]) 09/19/2020   Choking episode    Acute hypoxemic respiratory failure (HCC)    Aspiration pneumonia ([place]) 04/26/2020   Dysphagia 11/30/2015   Hiatal hernia with gastroesophageal reflux 05/10/2014   Spastic paraplegia 05/06/2014   Colitis 03/26/2012   Diarrhea 03/25/2012   Lower urinary tract infectious disease 03/25/2012   Cerebral palsy ([place]) 03/25/2012   H/O: GI bleed 03/25/2012   Esophageal ulcer 01/29/2012   Hypokalemia 01/29/2012   Anemia 01/29/2012   Hematemesis 01/28/2012    Past Surgical History:  Procedure Laterality Date   ESOPHAGOGASTRODUODENOSCOPY  01/28/2012   Procedure: ESOPHAGOGASTRODUODENOSCOPY (EGD);  Surgeon: Lear Ng, MD;  Location: [facility] ENDOSCOPY;  Service: Endoscopy;  Laterality: N/A;    ESOPHAGOGASTRODUODENOSCOPY N/A 05/10/2014   Procedure: ESOPHAGOGASTRODUODENOSCOPY (EGD);  Surgeon: Missy Sabins, MD;  Location: [facility] ENDOSCOPY;  Service: Endoscopy;  Laterality: N/A;   right hip surgery     TONSILLECTOMY       OB History   No obstetric history on file.     History reviewed. No pertinent family history.  Social History   Tobacco Use   Smoking status: Never   Smokeless tobacco: Never  Substance Use Topics   Alcohol use: No   Drug use: No    Home Medications Prior to Admission medications   Medication Sig Start Date End Date Taking? Authorizing Provider  acetaminophen (TYLENOL) 500 MG tablet Take 500 mg by mouth 2 (two) times daily.   Yes [provider]  atorvastatin (LIPITOR) 10 MG tablet Take 10 mg by mouth daily.   Yes [provider]  bisacodyl (DULCOLAX) 10 MG suppository Place 10 mg rectally every 3 (three) days.   Yes [provider]  busPIRone (BUSPAR) 15 MG tablet Take 15 mg by mouth 3 (three) times daily.   Yes [provider]  cholecalciferol (VITAMIN D) 1000 UNITS tablet Take 2,000 Units by mouth daily.   Yes [provider]  citalopram (CELEXA) 20 MG tablet Take 1 tablet (20 mg total) by mouth daily. 09/24/20 04/09/21 Yes Dessa Phi, DO  Cranberry 450 MG CAPS Take 1 capsule by mouth 2 (two) times daily.   Yes [provider]  diazepam (VALIUM) 2 MG tablet Take 0.5 tablets (1 mg total) by mouth 2 (two) times daily. 09/24/20  Yes Maylene Roes,  Anderson Malta, DO  diphenhydrAMINE (BENADRYL) 25 mg capsule Take 25 mg by mouth every 8 (eight) hours as needed for itching.   Yes [provider]  docusate sodium (COLACE) 100 MG capsule Take 100 mg by mouth daily.    Yes [provider]  ferrous sulfate 220 (44 Fe) MG/5ML solution Take 220 mg by mouth at bedtime.   Yes [provider]  fluticasone (FLONASE) 50 MCG/ACT nasal spray Place 1 spray into both nostrils daily.   Yes [provider]  guaifenesin (ROBITUSSIN) 100 MG/5ML syrup Take 200 mg by mouth 4 (four) times daily as needed for cough.   Yes [provider]  HYDROcodone-acetaminophen (NORCO/VICODIN) 5-325 MG tablet Take 1 tablet by mouth daily as needed for moderate pain. One tablet by mouth daily at lunch time as needed for breakthrough pain 09/24/20  Yes Dessa Phi, DO  hydrocortisone cream 1 % Apply 1 application topically 2 (two) times daily as needed for itching. Apply to affected areas twice daily as needed for itching   Yes [provider]  ibuprofen (ADVIL) 200 MG tablet Take 200 mg by mouth 2 (two) times daily.   Yes [provider]  ketoconazole (NIZORAL) 2 % cream Apply 1 application topically 2 (two) times a week. Wednesday and Saturday. Shampoo hair twice weekly for treatment of dry scalp   Yes [provider]  ketotifen (ZADITOR) 0.025 % ophthalmic solution Place 1 drop into both eyes 2 (two) times daily as needed (allergies).   Yes [provider]  lidocaine (LIDODERM) 5 % Place 1 patch onto the skin daily. Remove & Discard patch within 12 hours or as directed by MD   Yes [provider]  loperamide (IMODIUM A-D) 2 MG tablet Take 2 mg by mouth 4 (four) times daily as needed for diarrhea or loose stools. Give 4mg  po prn loose stools. May repeat 2mg  po tid for additional loose stools   Yes [provider]  metoprolol tartrate (LOPRESSOR) 25 MG tablet Take 1 tablet (25 mg total) by mouth 2 (two) times daily. 09/24/20 04/09/21 Yes Dessa Phi, DO  ondansetron (ZOFRAN) 4 MG tablet Take 4 mg by mouth every 8 (eight) hours as needed for nausea or vomiting.   Yes [provider]  pantoprazole (PROTONIX) 40 MG tablet Take 1 tablet (40 mg total) by mouth 2 (two) times daily. 05/12/14  Yes Burnard Bunting, MD  Phenol [facility] [place]) Use as directed in the mouth or throat. One spray to throat every 2 hours as needed for sore throad   Yes [provider]  polyethylene glycol (MIRALAX / GLYCOLAX) packet Take 17 g by mouth daily.   Yes [provider]  saccharomyces boulardii (FLORASTOR) 250 MG capsule Take 250 mg by mouth 2 (two) times daily.   Yes [provider]  senna (SENOKOT) 8.6 MG tablet Take 1 tablet by mouth daily as needed for constipation.   Yes [provider]  sucralfate (CARAFATE) 1 g tablet Take 1 g by mouth 2 (two) times daily.   Yes [provider]  triamcinolone cream (KENALOG) 0.1 % Apply 1 application topically 2 (two) times daily as needed (rash).   Yes [provider]  aspirin EC 81 MG EC tablet Take 1 tablet (81 mg total) by mouth daily. Swallow whole. Patient not taking: No sig reported 09/24/20   Dessa Phi, DO    Allergies    Vancomycin  Review of Systems   Review of Systems  Constitutional:  Positive for chills, fatigue and fever.  HENT:  Negative for ear pain and sore throat.   Eyes:  Negative for pain and visual disturbance.  Respiratory:  Negative for cough and shortness of breath.   Cardiovascular:  Negative for chest pain and palpitations.  Gastrointestinal:  Positive for abdominal pain, nausea and vomiting. Negative for constipation and diarrhea.  Genitourinary:  Negative for dysuria and hematuria.  Musculoskeletal:  Negative for arthralgias and back pain.  Skin:  Negative for color change and rash.  Neurological:  Negative for seizures and syncope.  Psychiatric/Behavioral:  Negative for agitation and confusion.   All other systems reviewed and are negative.  Physical Exam Updated Vital Signs BP (!) 124/58 (BP Location: Left Arm)   Pulse 75   Temp 98.4 F (36.9 C)   Resp 16   Ht 5\' 2"  (1.575 m)   Wt 45.4 kg   SpO2 93%   BMI 18.29 kg/m   Physical Exam Vitals and nursing note reviewed.  Constitutional:      Appearance: She is ill-appearing. She is not toxic-appearing.  HENT:     Head: Normocephalic and atraumatic.     Right Ear:  External ear normal.     Left Ear: External ear normal.     Nose: Nose normal.     Mouth/Throat:     Mouth: Mucous membranes are moist.     Pharynx: Oropharynx is clear.  Eyes:     Extraocular Movements: Extraocular movements intact.     Conjunctiva/sclera: Conjunctivae normal.     Pupils: Pupils are equal, round, and reactive to light.  Cardiovascular:     Rate and Rhythm: Normal rate.     Pulses: Normal pulses.     Heart sounds: No murmur heard.   No friction rub. No gallop.  Pulmonary:     Effort: Pulmonary effort is normal.     Breath sounds: No stridor. No wheezing, rhonchi or rales.  Abdominal:     General: There is no distension.     Palpations: Abdomen is soft.     Tenderness: There is abdominal tenderness (suprapubic). There is no right CVA tenderness, left CVA tenderness, guarding or rebound.  Musculoskeletal:     Cervical back: No tenderness.     Right lower leg: No edema.     Left lower leg: No edema.     Comments: Diffuse contractures 2/2 CP  Lymphadenopathy:     Cervical: No cervical adenopathy.  Skin:    General: Skin is warm and dry.  Neurological:     Mental Status: She is alert and oriented to person, place, and time. Mental status is at baseline.  Psychiatric:        Mood and Affect: Mood normal.        Behavior: Behavior normal.    ED Results / Procedures / Treatments   Labs (all labs ordered are listed, but only abnormal results are displayed) Labs Reviewed  CBC WITH DIFFERENTIAL/PLATELET - Abnormal; Notable for the following components:      Result Value   WBC 14.2 (*)    Hemoglobin 11.9 (*)    RDW 15.9 (*)    Neutro Abs 11.5 (*)    Monocytes Absolute 1.3 (*)    All other components within normal limits  COMPREHENSIVE METABOLIC PANEL - Abnormal; Notable for the following components:   Potassium 3.2 (*)    Glucose, Bld 117 (*)    Calcium 8.3 (*)    Total Protein 6.1 (*)  Albumin 3.0 (*)    AST 14 (*)    All other components within normal  limits  URINALYSIS, ROUTINE W REFLEX MICROSCOPIC - Abnormal; Notable for the following components:   APPearance HAZY (*)    Specific Gravity, Urine 1.039 (*)    Hgb urine dipstick MODERATE (*)    Nitrite POSITIVE (*)    Leukocytes,Ua LARGE (*)    All other components within normal limits  CBC - Abnormal; Notable for the following components:   WBC 11.2 (*)    RDW 16.2 (*)    All other components within normal limits  BASIC METABOLIC PANEL - Abnormal; Notable for the following components:   Glucose, Bld 102 (*)    BUN 6 (*)    Creatinine, Ser 0.41 (*)    Calcium 8.8 (*)    All other components within normal limits  CULTURE, BLOOD (ROUTINE X 2)  CULTURE, BLOOD (ROUTINE X 2)  RESP PANEL BY RT-PCR (FLU A&B, COVID) ARPGX2  URINE CULTURE  MRSA NEXT GEN BY PCR, NASAL  LIPASE, BLOOD  LACTIC ACID, PLASMA  PROTIME-INR  APTT  MAGNESIUM  LACTIC ACID, PLASMA  BASIC METABOLIC PANEL  CBC  CBC WITH DIFFERENTIAL/PLATELET    EKG EKG Interpretation  Date/Time:  Monday January 31 2021 19:18:21 EDT Ventricular Rate:  89 PR Interval:  140 QRS Duration: 86 QT Interval:  364 QTC Calculation: 443 R Axis:   43 Text Interpretation: Sinus rhythm Atrial premature complex Abnormal R-wave progression, early transition Minimal ST depression, anterolateral leads Confirmed by Sherwood Gambler [phone]) on 01/31/2021 8:17:26 PM Also confirmed by Sherwood Gambler [phone]), editor Mariam Dollar [phone])  on 02/01/2021 7:16:14 AM  Radiology CT Angio Chest Pulmonary Embolism (PE) W or WO Contrast  Result Date: 02/01/2021 CLINICAL DATA:  PE suspected, cough, fever, nausea, vomiting EXAM: CT ANGIOGRAPHY CHEST WITH CONTRAST TECHNIQUE: Multidetector CT imaging of the chest was performed using the standard protocol during bolus administration of intravenous contrast. Multiplanar CT image reconstructions and MIPs were obtained to evaluate the vascular anatomy. CONTRAST:  61mL OMNIPAQUE IOHEXOL 350 MG/ML SOLN COMPARISON:   09/19/2020 FINDINGS: Cardiovascular: Satisfactory opacification of the pulmonary arteries to the segmental level. No evidence of pulmonary embolism. Cardiomegaly. No pericardial effusion. Aortic atherosclerosis. Mediastinum/Nodes: No enlarged mediastinal, hilar, or axillary lymph nodes. Large hiatal hernia with intrathoracic position of the gastric body and fundus. Redemonstrated large, heterogeneous nodule of the left lobe of the thyroid, extending substernally and measuring at least 6.5 cm. Rightward deflection of the trachea. Lungs/Pleura: Trace right pleural effusion and dependent consolidation, characteristic in appearance for round atelectasis. Underlying, bandlike scarring and or atelectasis of the bilateral lung bases. No pleural effusion or pneumothorax. Upper Abdomen: No acute abnormality. Musculoskeletal: No chest wall abnormality. No acute or significant osseous findings. Review of the MIP images confirms the above findings. IMPRESSION: 1. Negative examination for pulmonary embolism. 2. Trace right pleural effusion and dependent consolidation, characteristic in appearance for round atelectasis. Underlying, bandlike scarring and or atelectasis of the bilateral lung bases. 3. Large hiatal hernia with intrathoracic position of the gastric body and fundus. 4. Redemonstrated large, heterogeneous nodule of the left lobe of the thyroid, extending substernally and measuring at least 6.5 cm. Rightward deflection of the trachea. Recommend thyroid US if and when clinically appropriate (ref: J Am Coll Radiol. 2015 Feb;12(2): 143-50). 5. Aortic atherosclerosis. Aortic Atherosclerosis (ICD10-I70.0). Electronically Signed   By: Eddie Candle M.D.   On: 02/01/2021 10:19   CT ABDOMEN PELVIS W CONTRAST  Result Date: 01/31/2021  CLINICAL DATA:  Abdominal pain, fever EXAM: CT ABDOMEN AND PELVIS WITH CONTRAST TECHNIQUE: Multidetector CT imaging of the abdomen and pelvis was performed using the standard protocol following  bolus administration of intravenous contrast. CONTRAST:  133mL OMNIPAQUE IOHEXOL 300 MG/ML  SOLN COMPARISON:  CT abdomen pelvis 09/19/2020. FINDINGS: Lower chest: Partially visualized large hiatal hernia containing the majority of the gastric lumen. Associated passive atelectasis of the left lower lobe. Right lower lobe bronchiectasis and bronchial wall thickening. Similar-appearing calcification at the right base. Associated nodular like 2.8 x 1.1 cm subpleural density (3:9). Hepatobiliary: No focal liver abnormality. No gallstones, gallbladder wall thickening, or pericholecystic fluid. No biliary dilatation. Pancreas: No focal lesion. Normal pancreatic contour. No surrounding inflammatory changes. No main pancreatic ductal dilatation. Spleen: Normal in size without focal abnormality. Adrenals/Urinary Tract: No adrenal nodule bilaterally. Bilateral kidneys enhance symmetrically. 2.8 cm fluid density lesion within left kidney likely represents a simple renal cyst. Subcentimeter hypodensities are too small to characterize. No hydronephrosis. No hydroureter. The urinary bladder is unremarkable. On delayed imaging, there is no urothelial wall thickening and there are no filling defects in the opacified portions of the bilateral collecting systems or ureters. Stomach/Bowel: Stomach is within normal limits. No evidence of bowel wall thickening or dilatation. Appendix appears normal. Vascular/Lymphatic: No abdominal aorta or iliac aneurysm. Mild atherosclerotic plaque of the aorta and its branches. No abdominal, pelvic, or inguinal lymphadenopathy. Reproductive: Uterus and bilateral adnexa are unremarkable. Other: No intraperitoneal free fluid. No intraperitoneal free gas. No organized fluid collection. Musculoskeletal: No abdominal wall hernia or abnormality. Diffusely decreased bone density. No suspicious lytic or blastic osseous lesions. No acute displaced fracture. Multilevel degenerative changes of the spine. Right  femoral neck surgical hardware fusion. IMPRESSION: 1. Partially visualized large hiatal hernia containing the majority of the gastric lumen. 2. Otherwise no acute intra-abdominal or intrapelvic abnormality. Electronically Signed   By: Iven Finn M.D.   On: 01/31/2021 23:01   DG Chest Portable 1 View  Result Date: 01/31/2021 CLINICAL DATA:  Sepsis, febrile, tachypneic. EXAM: PORTABLE CHEST 1 VIEW COMPARISON:  Chest x-ray 09/21/2020, CT chest 09/19/2020 FINDINGS: The heart size and mediastinal contours are unchanged. No focal consolidation. No pulmonary edema. No pleural effusion. No pneumothorax. No acute osseous abnormality. IMPRESSION: No active disease. Electronically Signed   By: Iven Finn M.D.   On: 01/31/2021 20:35    Procedures Procedures   Medications Ordered in ED Medications  HYDROcodone-acetaminophen (NORCO/VICODIN) 5-325 MG per tablet 1 tablet (1 tablet Oral Given 02/01/21 1939)  ibuprofen (ADVIL) tablet 200 mg (200 mg Oral Given 02/01/21 1939)  atorvastatin (LIPITOR) tablet 10 mg (10 mg Oral Given 02/01/21 1026)  metoprolol tartrate (LOPRESSOR) tablet 25 mg (25 mg Oral Given 02/01/21 2219)  busPIRone (BUSPAR) tablet 15 mg (15 mg Oral Given 02/01/21 2219)  citalopram (CELEXA) tablet 20 mg (20 mg Oral Given 02/01/21 1026)  diazepam (VALIUM) tablet 1 mg (1 mg Oral Given 02/01/21 2219)  docusate sodium (COLACE) capsule 100 mg (100 mg Oral Given 02/01/21 1023)  pantoprazole (PROTONIX) EC tablet 40 mg (40 mg Oral Given 02/01/21 2219)  polyethylene glycol (MIRALAX / GLYCOLAX) packet 17 g (17 g Oral Not Given 02/01/21 1041)  sucralfate (CARAFATE) tablet 1 g (1 g Oral Given 02/01/21 1826)  guaiFENesin (ROBITUSSIN) 100 MG/5ML solution 200 mg (has no administration in time range)  hydrocortisone cream 1 % 1 application (1 application Topical Given 02/01/21 0615)  lidocaine (LIDODERM) 5 % 1 patch (1 patch Transdermal Not Given 02/01/21 1052)  enoxaparin (LOVENOX) injection 30 mg (30 mg Subcutaneous Given  02/01/21 1046)  0.9 %  sodium chloride infusion (has no administration in time range)  acetaminophen (TYLENOL) tablet 650 mg (has no administration in time range)    Or  acetaminophen (TYLENOL) suppository 650 mg (has no administration in time range)  bisacodyl (DULCOLAX) EC tablet 5 mg (has no administration in time range)  senna (SENOKOT) tablet 8.6 mg (has no administration in time range)  ondansetron (ZOFRAN) tablet 4 mg (has no administration in time range)    Or  ondansetron (ZOFRAN) injection 4 mg (has no administration in time range)  cefTRIAXone (ROCEPHIN) 1 g in sodium chloride 0.9 % 100 mL IVPB (0 g Intravenous Stopped 02/01/21 1116)  iohexol (OMNIPAQUE) 300 MG/ML solution 100 mL (100 mLs Intravenous Contrast Given 01/31/21 2252)  lactated ringers bolus 1,000 mL (0 mLs Intravenous Stopped 02/01/21 0146)    And  lactated ringers bolus 500 mL (0 mLs Intravenous Stopped 02/01/21 0255)  ceFEPIme (MAXIPIME) 2 g in sodium chloride 0.9 % 100 mL IVPB (0 g Intravenous Stopped 02/01/21 0132)  metroNIDAZOLE (FLAGYL) IVPB 500 mg (0 mg Intravenous Stopped 02/01/21 0253)  vancomycin (VANCOREADY) IVPB 1000 mg/200 mL (0 mg Intravenous Stopped 02/01/21 0253)  acetaminophen (TYLENOL) tablet 1,000 mg (1,000 mg Oral Given 02/01/21 0133)  potassium chloride SA (KLOR-CON) CR tablet 20 mEq (20 mEq Oral Given 02/01/21 1825)  iohexol (OMNIPAQUE) 350 MG/ML injection 100 mL (75 mLs Intravenous Contrast Given 02/01/21 1002)    ED Course  I have reviewed the triage vital signs and the nursing notes.  Pertinent labs & imaging results that were available during my care of the patient were reviewed by me and considered in my medical decision making (see chart for details).  Clinical Course as of 02/01/21 2240  Tue Feb 01, 2021  0100 Hemoglobin(!): 11.9 baseline [HM]  0102 Lactic Acid, Venous: 0.8 WNL [HM]    Clinical Course User Index [HM] Muthersbaugh, Gwenlyn Perking   MDM Rules/Calculators/A&P                           This is an 23yoF w/ cerebral palsy, cognitive impairment, hiatal hernia, HTN, HLD, brought from [place] for concern of sepsis due to her new onset fever, NV, abdominal pain.  On exam, she has suprapubic tenderness.  DDx included: Appendicitis, pancreatitis, diverticulitis, NEC, obstruction, stenosis, volvulus, perforation, ruptured cyst, torsion, gastritis, GI ulcer, IBD, IBS, constipation, metabolic derangement, toxicity  All studies independently reviewed by myself, d/w the attending physician, factored into my MDM. -EKG: NSR 89 bpm, normal axis, normal intervals, nonischemic, essentially unchanged compared to prior from 08/2020. -CT AP with contrast: Large hiatal hernia -CBCd: WBC 14.2 -CMP: K+ 3.2 -Lactate: 0.8 -Unremarkable: CXR, lipase Macko/influenza PCR negative -Pending: Blood culture, urinalysis  Presentation most concerning for UTI.  Stable pressures, nontoxic-appearing.  No guarding, rebound, distention, rigidity on serial abdominal examinations.  CT abdomen pelvis reassuring.  Vomiting is nonbilious, not feculent; continue to pass flatus.  Less likely torsion.  No Hx IBD or IBS, would be unusual for this to present now.  No evidence of constipation on CT.  No significant metabolic derangement.  Unlikely due to ingestion or intoxication, no significant medication changes or environment changes.  Will proceed pending remainder of testing.  Patient agreeable with this plan.  Hemodynamically stable on reevaluation prior to signout.  Course care and plan discussed with oncoming team, to whom patient care was transferred.  Final  Clinical Impression(s) / ED Diagnoses Final diagnoses:  Sepsis without acute organ dysfunction, due to unspecified organism [facility])    Rx / DC Orders ED Discharge Orders     None        Levin Bacon, MD 02/01/21 2241    Sherwood Gambler, MD 02/06/21 [phone]

## 2021-01-31 NOTE — ED Notes (Signed)
Pt desatted to 80s on RA while this RN in room - this RN started pt on 3L [place]

## 2021-02-01 ENCOUNTER — Inpatient Hospital Stay (HOSPITAL_COMMUNITY): Payer: Medicare Other

## 2021-02-01 ENCOUNTER — Encounter (HOSPITAL_COMMUNITY): Payer: Self-pay | Admitting: Family Medicine

## 2021-02-01 DIAGNOSIS — N390 Urinary tract infection, site not specified: Secondary | ICD-10-CM | POA: Diagnosis present

## 2021-02-01 DIAGNOSIS — E876 Hypokalemia: Secondary | ICD-10-CM | POA: Diagnosis present

## 2021-02-01 DIAGNOSIS — K449 Diaphragmatic hernia without obstruction or gangrene: Secondary | ICD-10-CM | POA: Diagnosis present

## 2021-02-01 DIAGNOSIS — Z79899 Other long term (current) drug therapy: Secondary | ICD-10-CM | POA: Diagnosis not present

## 2021-02-01 DIAGNOSIS — R4189 Other symptoms and signs involving cognitive functions and awareness: Secondary | ICD-10-CM | POA: Diagnosis present

## 2021-02-01 DIAGNOSIS — Z8701 Personal history of pneumonia (recurrent): Secondary | ICD-10-CM | POA: Diagnosis not present

## 2021-02-01 DIAGNOSIS — E785 Hyperlipidemia, unspecified: Secondary | ICD-10-CM | POA: Diagnosis present

## 2021-02-01 DIAGNOSIS — J9601 Acute respiratory failure with hypoxia: Secondary | ICD-10-CM | POA: Diagnosis present

## 2021-02-01 DIAGNOSIS — G800 Spastic quadriplegic cerebral palsy: Secondary | ICD-10-CM | POA: Diagnosis not present

## 2021-02-01 DIAGNOSIS — K219 Gastro-esophageal reflux disease without esophagitis: Secondary | ICD-10-CM

## 2021-02-01 DIAGNOSIS — A419 Sepsis, unspecified organism: Principal | ICD-10-CM

## 2021-02-01 DIAGNOSIS — F419 Anxiety disorder, unspecified: Secondary | ICD-10-CM | POA: Diagnosis present

## 2021-02-01 DIAGNOSIS — J9811 Atelectasis: Secondary | ICD-10-CM | POA: Diagnosis present

## 2021-02-01 DIAGNOSIS — Z87440 Personal history of urinary (tract) infections: Secondary | ICD-10-CM | POA: Diagnosis not present

## 2021-02-01 DIAGNOSIS — G894 Chronic pain syndrome: Secondary | ICD-10-CM | POA: Diagnosis present

## 2021-02-01 DIAGNOSIS — Z881 Allergy status to other antibiotic agents status: Secondary | ICD-10-CM | POA: Diagnosis not present

## 2021-02-01 DIAGNOSIS — Z66 Do not resuscitate: Secondary | ICD-10-CM | POA: Diagnosis present

## 2021-02-01 DIAGNOSIS — G809 Cerebral palsy, unspecified: Secondary | ICD-10-CM | POA: Diagnosis present

## 2021-02-01 DIAGNOSIS — I10 Essential (primary) hypertension: Secondary | ICD-10-CM | POA: Diagnosis present

## 2021-02-01 DIAGNOSIS — Z20822 Contact with and (suspected) exposure to covid-19: Secondary | ICD-10-CM | POA: Diagnosis present

## 2021-02-01 LAB — URINALYSIS, ROUTINE W REFLEX MICROSCOPIC
Bacteria, UA: NONE SEEN
Bilirubin Urine: NEGATIVE
Glucose, UA: NEGATIVE mg/dL
Ketones, ur: NEGATIVE mg/dL
Nitrite: POSITIVE — AB
Protein, ur: NEGATIVE mg/dL
Specific Gravity, Urine: 1.039 — ABNORMAL HIGH (ref 1.005–1.030)
pH: 7 (ref 5.0–8.0)

## 2021-02-01 LAB — CBC
HCT: 39.2 % (ref 36.0–46.0)
Hemoglobin: 12.6 g/dL (ref 12.0–15.0)
MCH: 28.1 pg (ref 26.0–34.0)
MCHC: 32.1 g/dL (ref 30.0–36.0)
MCV: 87.5 fL (ref 80.0–100.0)
Platelets: 231 10*3/uL (ref 150–400)
RBC: 4.48 MIL/uL (ref 3.87–5.11)
RDW: 16.2 % — ABNORMAL HIGH (ref 11.5–15.5)
WBC: 11.2 10*3/uL — ABNORMAL HIGH (ref 4.0–10.5)
nRBC: 0 % (ref 0.0–0.2)

## 2021-02-01 LAB — PROTIME-INR
INR: 1.1 (ref 0.8–1.2)
Prothrombin Time: 14.1 seconds (ref 11.4–15.2)

## 2021-02-01 LAB — BASIC METABOLIC PANEL
Anion gap: 6 (ref 5–15)
BUN: 6 mg/dL — ABNORMAL LOW (ref 8–23)
CO2: 24 mmol/L (ref 22–32)
Calcium: 8.8 mg/dL — ABNORMAL LOW (ref 8.9–10.3)
Chloride: 108 mmol/L (ref 98–111)
Creatinine, Ser: 0.41 mg/dL — ABNORMAL LOW (ref 0.44–1.00)
GFR, Estimated: >60 mL/min (ref >60)
Glucose, Bld: 102 mg/dL — ABNORMAL HIGH (ref 70–99)
Potassium: 3.5 mmol/L (ref 3.5–5.1)
Sodium: 138 mmol/L (ref 135–145)

## 2021-02-01 LAB — APTT: aPTT: 30 seconds (ref 24–36)

## 2021-02-01 LAB — MAGNESIUM: Magnesium: 1.9 mg/dL (ref 1.7–2.4)

## 2021-02-01 MED ORDER — ACETAMINOPHEN 650 MG RE SUPP: 650.0000 mg | Freq: Four times a day (QID) | RECTAL | Status: DC | PRN

## 2021-02-01 MED ORDER — DIPHENHYDRAMINE-ZINC ACETATE 2-0.1 % EX CREA
TOPICAL_CREAM | Freq: Three times a day (TID) | CUTANEOUS | Status: DC | PRN
  Filled 2021-02-01: qty 28

## 2021-02-01 MED ORDER — POLYETHYLENE GLYCOL 3350 17 G PO PACK
17.0000 g | PACK | Freq: Every day | ORAL | Status: DC
  Administered 2021-02-02: 17 g via ORAL
  Filled 2021-02-01: qty 1

## 2021-02-01 MED ORDER — ACETAMINOPHEN 500 MG PO TABS
1000.0000 mg | ORAL_TABLET | Freq: Once | ORAL | Status: AC
  Administered 2021-02-01: 1000 mg via ORAL
  Filled 2021-02-01: qty 2

## 2021-02-01 MED ORDER — CITALOPRAM HYDROBROMIDE 20 MG PO TABS
20.0000 mg | ORAL_TABLET | Freq: Every day | ORAL | Status: DC
  Administered 2021-02-01 – 2021-02-02 (×2): 20 mg via ORAL
  Filled 2021-02-01: qty 1
  Filled 2021-02-01: qty 2

## 2021-02-01 MED ORDER — DIAZEPAM 2 MG PO TABS
1.0000 mg | ORAL_TABLET | Freq: Two times a day (BID) | ORAL | Status: DC
  Administered 2021-02-01 – 2021-02-02 (×2): 1 mg via ORAL
  Filled 2021-02-01 (×3): qty 1

## 2021-02-01 MED ORDER — HYDROCODONE-ACETAMINOPHEN 5-325 MG PO TABS
1.0000 | ORAL_TABLET | Freq: Every day | ORAL | Status: DC | PRN
  Administered 2021-02-01: 1 via ORAL
  Filled 2021-02-01: qty 1

## 2021-02-01 MED ORDER — SODIUM CHLORIDE 0.9 % IV SOLN
1.0000 g | INTRAVENOUS | Status: DC
  Administered 2021-02-01: 1 g via INTRAVENOUS
  Filled 2021-02-01 (×2): qty 10

## 2021-02-01 MED ORDER — GUAIFENESIN 100 MG/5ML PO SOLN
200.0000 mg | Freq: Four times a day (QID) | ORAL | Status: DC | PRN
  Filled 2021-02-01: qty 10

## 2021-02-01 MED ORDER — ONDANSETRON HCL 4 MG PO TABS: 4.0000 mg | ORAL_TABLET | Freq: Four times a day (QID) | ORAL | Status: DC | PRN

## 2021-02-01 MED ORDER — ONDANSETRON HCL 4 MG/2ML IJ SOLN: 4.0000 mg | Freq: Four times a day (QID) | INTRAMUSCULAR | Status: DC | PRN

## 2021-02-01 MED ORDER — ACETAMINOPHEN 325 MG PO TABS: 650.0000 mg | ORAL_TABLET | Freq: Four times a day (QID) | ORAL | Status: DC | PRN

## 2021-02-01 MED ORDER — LIDOCAINE 5 % EX PTCH
1.0000 | MEDICATED_PATCH | CUTANEOUS | Status: DC
  Administered 2021-02-02: 1 via TRANSDERMAL
  Filled 2021-02-01: qty 1

## 2021-02-01 MED ORDER — DOCUSATE SODIUM 100 MG PO CAPS
100.0000 mg | ORAL_CAPSULE | Freq: Every day | ORAL | Status: DC
  Administered 2021-02-01 – 2021-02-02 (×2): 100 mg via ORAL
  Filled 2021-02-01 (×2): qty 1

## 2021-02-01 MED ORDER — ATORVASTATIN CALCIUM 10 MG PO TABS
10.0000 mg | ORAL_TABLET | Freq: Every day | ORAL | Status: DC
  Administered 2021-02-01 – 2021-02-02 (×2): 10 mg via ORAL
  Filled 2021-02-01 (×2): qty 1

## 2021-02-01 MED ORDER — HYDROCORTISONE 1 % EX CREA
1.0000 "application " | TOPICAL_CREAM | Freq: Two times a day (BID) | CUTANEOUS | Status: DC | PRN
  Administered 2021-02-01: 1 via TOPICAL
  Filled 2021-02-01: qty 28

## 2021-02-01 MED ORDER — ENOXAPARIN SODIUM 30 MG/0.3ML IJ SOSY
30.0000 mg | PREFILLED_SYRINGE | Freq: Every day | INTRAMUSCULAR | Status: DC
  Administered 2021-02-01 – 2021-02-02 (×2): 30 mg via SUBCUTANEOUS
  Filled 2021-02-01 (×2): qty 0.3

## 2021-02-01 MED ORDER — SUCRALFATE 1 G PO TABS
1.0000 g | ORAL_TABLET | Freq: Two times a day (BID) | ORAL | Status: DC
  Administered 2021-02-01 – 2021-02-02 (×4): 1 g via ORAL
  Filled 2021-02-01 (×4): qty 1

## 2021-02-01 MED ORDER — PANTOPRAZOLE SODIUM 40 MG PO TBEC
40.0000 mg | DELAYED_RELEASE_TABLET | Freq: Two times a day (BID) | ORAL | Status: DC
  Administered 2021-02-01 – 2021-02-02 (×3): 40 mg via ORAL
  Filled 2021-02-01 (×3): qty 1

## 2021-02-01 MED ORDER — POTASSIUM CHLORIDE CRYS ER 20 MEQ PO TBCR
20.0000 meq | EXTENDED_RELEASE_TABLET | Freq: Three times a day (TID) | ORAL | Status: AC
  Administered 2021-02-01 (×3): 20 meq via ORAL
  Filled 2021-02-01 (×3): qty 1

## 2021-02-01 MED ORDER — IBUPROFEN 200 MG PO TABS
200.0000 mg | ORAL_TABLET | Freq: Two times a day (BID) | ORAL | Status: DC
  Administered 2021-02-01 – 2021-02-02 (×4): 200 mg via ORAL
  Filled 2021-02-01 (×4): qty 1

## 2021-02-01 MED ORDER — BISACODYL 5 MG PO TBEC
5.0000 mg | DELAYED_RELEASE_TABLET | Freq: Every day | ORAL | Status: DC | PRN
Start: 2021-02-01 — End: 2021-02-03

## 2021-02-01 MED ORDER — SENNA 8.6 MG PO TABS: 1.0000 | ORAL_TABLET | Freq: Every day | ORAL | Status: DC | PRN

## 2021-02-01 MED ORDER — SODIUM CHLORIDE 0.9 % IV SOLN: 250.0000 mL | INTRAVENOUS | Status: DC | PRN

## 2021-02-01 MED ORDER — IOHEXOL 350 MG/ML SOLN
100.0000 mL | Freq: Once | INTRAVENOUS | Status: AC | PRN
  Administered 2021-02-01: 75 mL via INTRAVENOUS

## 2021-02-01 MED ORDER — METOPROLOL TARTRATE 25 MG PO TABS
25.0000 mg | ORAL_TABLET | Freq: Two times a day (BID) | ORAL | Status: DC
  Administered 2021-02-01 – 2021-02-02 (×3): 25 mg via ORAL
  Filled 2021-02-01 (×3): qty 1

## 2021-02-01 MED ORDER — BUSPIRONE HCL 5 MG PO TABS
15.0000 mg | ORAL_TABLET | Freq: Three times a day (TID) | ORAL | Status: DC
  Administered 2021-02-01 – 2021-02-02 (×5): 15 mg via ORAL
  Filled 2021-02-01: qty 2
  Filled 2021-02-01 (×2): qty 3
  Filled 2021-02-01: qty 2
  Filled 2021-02-01: qty 3

## 2021-02-01 NOTE — ED Notes (Signed)
90% RA at this time

## 2021-02-01 NOTE — ED Notes (Signed)
Placed Breakfast Order 

## 2021-02-01 NOTE — Sepsis Progress Note (Signed)
Elink following for Code Sepsis  

## 2021-02-01 NOTE — ED Notes (Signed)
Dropped Advil during administration, re-pulled the med for admin.

## 2021-02-01 NOTE — ED Notes (Signed)
Redrew labs that were hemolyzed/cancelled. Alert, NAD, calm, interactive. 2nd IV started.

## 2021-02-01 NOTE — ED Notes (Addendum)
MD at [facility]. 96% on 4L [place]. Will assess RA SPO2.

## 2021-02-01 NOTE — ED Notes (Signed)
SPO2 83% RA, returned to O2 2L [place]. PO meds crushed and put in apple Sauce. Difficulty swallowing meds.  Some coughing, and choking. Ativan not given.

## 2021-02-01 NOTE — ED Notes (Addendum)
Pt IV site where Vancomycin red, warm, and pt reports itching. Dr. Myna Hidalgo made aware. Pharmacy made aware. Benadryl to be ordered

## 2021-02-01 NOTE — ED Notes (Signed)
Dr. Opyd at bedside  

## 2021-02-01 NOTE — Progress Notes (Addendum)
Brief same-day note:  Patient is 85 year old female with history of cerebral palsy, cognitive impairment, chronic pain syndrome, hiatal hernia, hypertension, anxiety who presented from group home with complaints of abdomen, nausea and vomiting.  She was also reported to have dry cough, fever.  On presentation patient was febrile.  Lab work showed potassium of 3.2, elevated leukocytes, urine was notable for large leukocytes, positive nitrates.  Chest x-ray did not show any acute cardiopulmonary disease.  CT abdomen/pelvis showed large hiatal hernia but no acute intra-abdominal or pelvic abnormality.  Patient was admitted for the management of sepsis most likely from UTI, started on fluids and antibiotics. Patient seen and examined at bedside this morning.  During my evaluation she was hemodynamically stable.  She was requiring 3 to 4 L of oxygen per minute.  She denies any abdominal pain, nausea or vomiting or chest pain or shortness of breath during my evaluation.  Patient is a poor historian.  Examination revealed clear lungs, nondistended, nontender abdomen. Patient rapidly desaturated on room air.  We will do a CT chest with contrast to rule out PE. Will continue other management as same. Patient seen by Dr. Myna Hidalgo this morning.  I agree with assessment and plan.  Addendum:  CT PE protocol negative for PE. Hypoxia cud be secondary to atelectasis,large hiatal hernia. Will continue monitoring

## 2021-02-01 NOTE — H&P (Signed)
History and Physical    LYZETTE REINHARDT FGH:[id number] DOB: September 29, 1935 DOA: 01/31/2021  PCP: Seward Carol, MD   Patient coming from: SNF   Chief Complaint: Abdominal pain, N/V, cough, fever  HPI: KOSHA JAQUITH is a 85 y.o. female with medical history significant for cerebral palsy, cognitive impairment, chronic pain, large hiatal hernia, hypertension, and anxiety, now presenting to the emergency department for evaluation of abdominal pain, nausea, and vomiting.  Patient also reports a cough for the past day.  Symptoms began yesterday, she was noted to have a fever, and continued to be febrile despite treatment with acetaminophen at her facility.  Patient was reporting some lower abdominal pain nausea, had at least 1 episode of vomiting yesterday, has had a cough, but denies shortness of breath, denies chest pain, and denies flank pain.  ED Course: Upon arrival to the ED, patient is found to be febrile to 38.6 C orally, saturating mid 80s on room air, mildly tachypneic, and with stable blood pressure.  EKG features sinus rhythm with PAC.  Chemistry panel notable for potassium 3.2.  CBC features a leukocytosis to 14,200.  Urinalysis notable for large leukocytes and positive nitrites.  Chest x-ray negative for acute cardiopulmonary disease.  CT of the abdomen pelvis redemonstrates large hiatal hernia but no acute intra-abdominal or pelvic abnormality.  Blood and urine cultures were collected in the ED, 30 cc/kg LR bolus was administered, and the patient was treated with vancomycin, cefepime, and Flagyl.  Review of Systems:  All other systems reviewed and apart from HPI, are negative.  Past Medical History:  Diagnosis Date   Cerebral palsy ([place])    Cognitive impairment    Constipation    HTN (hypertension) 09/24/2020   Hyperlipidemia     Past Surgical History:  Procedure Laterality Date   ESOPHAGOGASTRODUODENOSCOPY  01/28/2012   Procedure: ESOPHAGOGASTRODUODENOSCOPY (EGD);  Surgeon: Lear Ng, MD;  Location: [facility] ENDOSCOPY;  Service: Endoscopy;  Laterality: N/A;   ESOPHAGOGASTRODUODENOSCOPY N/A 05/10/2014   Procedure: ESOPHAGOGASTRODUODENOSCOPY (EGD);  Surgeon: Missy Sabins, MD;  Location: [facility] ENDOSCOPY;  Service: Endoscopy;  Laterality: N/A;   right hip surgery     TONSILLECTOMY      Social History:   reports that she has never smoked. She has never used smokeless tobacco. She reports that she does not drink alcohol and does not use drugs.  Allergies  Allergen Reactions   Vancomycin Itching and Other (See Comments)    Itching, redness     History reviewed. No pertinent family history.   Prior to Admission medications   Medication Sig Start Date End Date Taking? Authorizing Provider  acetaminophen (TYLENOL) 500 MG tablet Take 500 mg by mouth 2 (two) times daily.   Yes [provider]  atorvastatin (LIPITOR) 10 MG tablet Take 10 mg by mouth daily.   Yes [provider]  bisacodyl (DULCOLAX) 10 MG suppository Place 10 mg rectally every 3 (three) days.   Yes [provider]  busPIRone (BUSPAR) 15 MG tablet Take 15 mg by mouth 3 (three) times daily.   Yes [provider]  cholecalciferol (VITAMIN D) 1000 UNITS tablet Take 2,000 Units by mouth daily.   Yes [provider]  citalopram (CELEXA) 20 MG tablet Take 1 tablet (20 mg total) by mouth daily. 09/24/20 04/09/21 Yes Dessa Phi, DO  Cranberry 450 MG CAPS Take 1 capsule by mouth 2 (two) times daily.   Yes [provider]  diazepam (VALIUM) 2 MG tablet Take 0.5 tablets (  1 mg total) by mouth 2 (two) times daily. 09/24/20  Yes Dessa Phi, DO  diphenhydrAMINE (BENADRYL) 25 mg capsule Take 25 mg by mouth every 8 (eight) hours as needed for itching.   Yes [provider]  docusate sodium (COLACE) 100 MG capsule Take 100 mg by mouth daily.    Yes [provider]  ferrous sulfate 220 (44 Fe) MG/5ML solution Take 220 mg by mouth at bedtime.   Yes  [provider]  fluticasone (FLONASE) 50 MCG/ACT nasal spray Place 1 spray into both nostrils daily.   Yes [provider]  guaifenesin (ROBITUSSIN) 100 MG/5ML syrup Take 200 mg by mouth 4 (four) times daily as needed for cough.   Yes [provider]  HYDROcodone-acetaminophen (NORCO/VICODIN) 5-325 MG tablet Take 1 tablet by mouth daily as needed for moderate pain. One tablet by mouth daily at lunch time as needed for breakthrough pain 09/24/20  Yes Dessa Phi, DO  hydrocortisone cream 1 % Apply 1 application topically 2 (two) times daily as needed for itching. Apply to affected areas twice daily as needed for itching   Yes [provider]  ibuprofen (ADVIL) 200 MG tablet Take 200 mg by mouth 2 (two) times daily.   Yes [provider]  ketoconazole (NIZORAL) 2 % cream Apply 1 application topically 2 (two) times a week. Wednesday and Saturday. Shampoo hair twice weekly for treatment of dry scalp   Yes [provider]  ketotifen (ZADITOR) 0.025 % ophthalmic solution Place 1 drop into both eyes 2 (two) times daily as needed (allergies).   Yes [provider]  lidocaine (LIDODERM) 5 % Place 1 patch onto the skin daily. Remove & Discard patch within 12 hours or as directed by MD   Yes [provider]  loperamide (IMODIUM A-D) 2 MG tablet Take 2 mg by mouth 4 (four) times daily as needed for diarrhea or loose stools. Give 4mg  po prn loose stools. May repeat 2mg  po tid for additional loose stools   Yes [provider]  metoprolol tartrate (LOPRESSOR) 25 MG tablet Take 1 tablet (25 mg total) by mouth 2 (two) times daily. 09/24/20 04/09/21 Yes Dessa Phi, DO  ondansetron (ZOFRAN) 4 MG tablet Take 4 mg by mouth every 8 (eight) hours as needed for nausea or vomiting.   Yes [provider]  pantoprazole (PROTONIX) 40 MG tablet Take 1 tablet (40 mg total) by mouth 2 (two) times daily. 05/12/14  Yes Burnard Bunting, MD   Phenol [facility] [place]) Use as directed in the mouth or throat. One spray to throat every 2 hours as needed for sore throad   Yes [provider]  polyethylene glycol (MIRALAX / GLYCOLAX) packet Take 17 g by mouth daily.   Yes [provider]  saccharomyces boulardii (FLORASTOR) 250 MG capsule Take 250 mg by mouth 2 (two) times daily.   Yes [provider]  senna (SENOKOT) 8.6 MG tablet Take 1 tablet by mouth daily as needed for constipation.   Yes [provider]  sucralfate (CARAFATE) 1 g tablet Take 1 g by mouth 2 (two) times daily.   Yes [provider]  triamcinolone cream (KENALOG) 0.1 % Apply 1 application topically 2 (two) times daily as needed (rash).   Yes [provider]  aspirin EC 81 MG EC tablet Take 1 tablet (81 mg total) by mouth daily. Swallow whole. Patient not taking: No sig reported 09/24/20   Dessa Phi, DO    Physical Exam: Vitals:  02/01/21 0130 02/01/21 0132 02/01/21 0200 02/01/21 0300  BP: (!) 159/82  (!) 126/108 (!) 174/91  Pulse: 81  82 78  Resp: 17  (!) 24 20  Temp:  99.3 F (37.4 C)    TempSrc:  Oral    SpO2: 95%  95% 95%  Weight:      Height:        Constitutional: NAD, calm  Eyes: PERTLA, lids and conjunctivae normal ENMT: Mucous membranes are moist. Posterior pharynx clear of any exudate or lesions.   Neck: supple, no masses  Respiratory:  no wheezing, no crackles. No cyanosis.  Cardiovascular: S1 & S2 heard, regular rate and rhythm. No extremity edema.  Abdomen: No distension, soft. Bowel sounds active.  Musculoskeletal: no clubbing / cyanosis. Lower extremity contractures.   Skin: erythema and induration around IV in left forearm. Warm, dry, well-perfused. Neurologic: No gross facial asymmetry. No aphasia. Moving both UEs.  Psychiatric: Alert and oriented to person, place, and situation. Pleasant and cooperative.    Labs and Imaging on Admission: I have personally reviewed following  labs and imaging studies  CBC: Recent Labs  Lab 01/31/21 1920  WBC 14.2*  NEUTROABS 11.5*  HGB 11.9*  HCT 38.3  MCV 88.2  PLT 814   Basic Metabolic Panel: Recent Labs  Lab 01/31/21 1920  NA 135  K 3.2*  CL 104  CO2 24  GLUCOSE 117*  BUN 11  CREATININE 0.51  CALCIUM 8.3*   GFR: Estimated Creatinine Clearance: 38.2 mL/min (by C-G formula based on SCr of 0.51 mg/dL). Liver Function Tests: Recent Labs  Lab 01/31/21 1920  AST 14*  ALT 12  ALKPHOS 69  BILITOT 0.9  PROT 6.1*  ALBUMIN 3.0*   Recent Labs  Lab 01/31/21 1920  LIPASE 25   No results for input(s): AMMONIA in the last 168 hours. Coagulation Profile: Recent Labs  Lab 02/01/21 0034  INR 1.1   Cardiac Enzymes: No results for input(s): CKTOTAL, CKMB, CKMBINDEX, TROPONINI in the last 168 hours. BNP (last 3 results) No results for input(s): PROBNP in the last 8760 hours. HbA1C: No results for input(s): HGBA1C in the last 72 hours. CBG: No results for input(s): GLUCAP in the last 168 hours. Lipid Profile: No results for input(s): CHOL, HDL, LDLCALC, TRIG, CHOLHDL, LDLDIRECT in the last 72 hours. Thyroid Function Tests: No results for input(s): TSH, T4TOTAL, FREET4, T3FREE, THYROIDAB in the last 72 hours. Anemia Panel: No results for input(s): VITAMINB12, FOLATE, FERRITIN, TIBC, IRON, RETICCTPCT in the last 72 hours. Urine analysis:    Component Value Date/Time   COLORURINE YELLOW 02/01/2021 0151   APPEARANCEUR HAZY (A) 02/01/2021 0151   LABSPEC 1.039 (H) 02/01/2021 0151   PHURINE 7.0 02/01/2021 0151   GLUCOSEU NEGATIVE 02/01/2021 0151   HGBUR MODERATE (A) 02/01/2021 0151   BILIRUBINUR NEGATIVE 02/01/2021 0151   KETONESUR NEGATIVE 02/01/2021 0151   PROTEINUR NEGATIVE 02/01/2021 0151   UROBILINOGEN 0.2 03/25/2012 1814   NITRITE POSITIVE (A) 02/01/2021 0151   LEUKOCYTESUR LARGE (A) 02/01/2021 0151   Sepsis Labs: @LABRCNTIP (procalcitonin:4,lacticidven:4) ) Recent Results (from the past 240  hour(s))  Resp Panel by RT-PCR (Flu A&B, Covid) Nasopharyngeal Swab     Status: None   Collection Time: 01/31/21  8:25 PM   Specimen: Nasopharyngeal Swab; Nasopharyngeal(NP) swabs in vial transport medium  Result Value Ref Range Status   SARS Coronavirus 2 by RT PCR NEGATIVE NEGATIVE Final    Comment: (NOTE) SARS-CoV-2 target nucleic acids are NOT DETECTED.  The SARS-CoV-2 RNA is  generally detectable in upper respiratory specimens during the acute phase of infection. The lowest concentration of SARS-CoV-2 viral copies this assay can detect is 138 copies/mL. A negative result does not preclude SARS-Cov-2 infection and should not be used as the sole basis for treatment or other patient management decisions. A negative result may occur with  improper specimen collection/handling, submission of specimen other than nasopharyngeal swab, presence of viral mutation(s) within the areas targeted by this assay, and inadequate number of viral copies(<138 copies/mL). A negative result must be combined with clinical observations, patient history, and epidemiological information. The expected result is Negative.  Fact Sheet for Patients:  EntrepreneurPulse.com.au  Fact Sheet for Healthcare Providers:  IncredibleEmployment.be  This test is no t yet approved or cleared by the [place] FDA and  has been authorized for detection and/or diagnosis of SARS-CoV-2 by FDA under an Emergency Use Authorization (EUA). This EUA will remain  in effect (meaning this test can be used) for the duration of the COVID-19 declaration under Section 564(b)(1) of the Act, 21 U.S.C.section 360bbb-3(b)(1), unless the authorization is terminated  or revoked sooner.       Influenza A by PCR NEGATIVE NEGATIVE Final   Influenza B by PCR NEGATIVE NEGATIVE Final    Comment: (NOTE) The Xpert Xpress SARS-CoV-2/FLU/RSV plus assay is intended as an aid in the diagnosis of influenza from  Nasopharyngeal swab specimens and should not be used as a sole basis for treatment. Nasal washings and aspirates are unacceptable for Xpert Xpress SARS-CoV-2/FLU/RSV testing.  Fact Sheet for Patients: EntrepreneurPulse.com.au  Fact Sheet for Healthcare Providers: IncredibleEmployment.be  This test is not yet approved or cleared by the [place] FDA and has been authorized for detection and/or diagnosis of SARS-CoV-2 by FDA under an Emergency Use Authorization (EUA). This EUA will remain in effect (meaning this test can be used) for the duration of the COVID-19 declaration under Section 564(b)(1) of the Act, 21 U.S.C. section 360bbb-3(b)(1), unless the authorization is terminated or revoked.  Performed at [facility], [address] [place], [place] [address]      Radiological Exams on Admission: CT ABDOMEN PELVIS W CONTRAST  Result Date: 01/31/2021 CLINICAL DATA:  Abdominal pain, fever EXAM: CT ABDOMEN AND PELVIS WITH CONTRAST TECHNIQUE: Multidetector CT imaging of the abdomen and pelvis was performed using the standard protocol following bolus administration of intravenous contrast. CONTRAST:  124mL OMNIPAQUE IOHEXOL 300 MG/ML  SOLN COMPARISON:  CT abdomen pelvis 09/19/2020. FINDINGS: Lower chest: Partially visualized large hiatal hernia containing the majority of the gastric lumen. Associated passive atelectasis of the left lower lobe. Right lower lobe bronchiectasis and bronchial wall thickening. Similar-appearing calcification at the right base. Associated nodular like 2.8 x 1.1 cm subpleural density (3:9). Hepatobiliary: No focal liver abnormality. No gallstones, gallbladder wall thickening, or pericholecystic fluid. No biliary dilatation. Pancreas: No focal lesion. Normal pancreatic contour. No surrounding inflammatory changes. No main pancreatic ductal dilatation. Spleen: Normal in size without focal abnormality. Adrenals/Urinary Tract:  No adrenal nodule bilaterally. Bilateral kidneys enhance symmetrically. 2.8 cm fluid density lesion within left kidney likely represents a simple renal cyst. Subcentimeter hypodensities are too small to characterize. No hydronephrosis. No hydroureter. The urinary bladder is unremarkable. On delayed imaging, there is no urothelial wall thickening and there are no filling defects in the opacified portions of the bilateral collecting systems or ureters. Stomach/Bowel: Stomach is within normal limits. No evidence of bowel wall thickening or dilatation. Appendix appears normal. Vascular/Lymphatic: No abdominal aorta or iliac aneurysm.  Mild atherosclerotic plaque of the aorta and its branches. No abdominal, pelvic, or inguinal lymphadenopathy. Reproductive: Uterus and bilateral adnexa are unremarkable. Other: No intraperitoneal free fluid. No intraperitoneal free gas. No organized fluid collection. Musculoskeletal: No abdominal wall hernia or abnormality. Diffusely decreased bone density. No suspicious lytic or blastic osseous lesions. No acute displaced fracture. Multilevel degenerative changes of the spine. Right femoral neck surgical hardware fusion. IMPRESSION: 1. Partially visualized large hiatal hernia containing the majority of the gastric lumen. 2. Otherwise no acute intra-abdominal or intrapelvic abnormality. Electronically Signed   By: Iven Finn M.D.   On: 01/31/2021 23:01   DG Chest Portable 1 View  Result Date: 01/31/2021 CLINICAL DATA:  Sepsis, febrile, tachypneic. EXAM: PORTABLE CHEST 1 VIEW COMPARISON:  Chest x-ray 09/21/2020, CT chest 09/19/2020 FINDINGS: The heart size and mediastinal contours are unchanged. No focal consolidation. No pulmonary edema. No pleural effusion. No pneumothorax. No acute osseous abnormality. IMPRESSION: No active disease. Electronically Signed   By: Iven Finn M.D.   On: 01/31/2021 20:35    EKG: Independently reviewed. Sinus rhythm, PAC.   Assessment/Plan    1. Suspected UTI  - Presents with lower abdominal pain and N/V and was found to have fever, mild tachypnea, and leukocytosis concerning for early sepsis  - Based on ED workup, presentation is most likely due to UTI (or pneumonia not evident on CXR) but she does not appear to have life-threatening organ dysfunction due to this and sepsis therefore ruled-out  - Blood and urine cultures were collected in ED, 30 cc/kg LR bolus was given, and she was treated with broad-spectrum antibiotics  - Treat suspected UTI with Rocephin, follow cultures and clinical course   2. Acute hypoxic respiratory failure  - Presents with lower abdominal pain and N/V and found to have new 3 Lpm supplemental O2 requirement  - No conspicuous pneumonia on CXR, COVID & flu negative, no chest pain or evidence of DVT  - Aspiration possible  - Encourage incentive spirometry, continue as-needed supplemental O2, consider repeat CXR or CT chest if hypoxia persists    3. Hypokalemia  - Potassium 3.2 in setting of N/V  - Replace orally if tolerated, repeat chem panel in am    4. Hiatal hernia, GERD  - Continue PPI     5. Anxiety  - Calm in ED  - Continue Buspar, Celexa, Valium    6. Chronic pain  - Continue current regimen, controlled substance database reviewed      DVT prophylaxis: Lovenox  Code Status: DNR, confirmed on admission  Level of Care: Level of care: Telemetry Medical Family Communication: None available at time of admission  Disposition Plan:  Patient is from: SNF  Anticipated d/c is to: SNF  Anticipated d/c date is: 02/03/21  Patient currently: Pending improvement in sepsis parameters; may need further evaluation of new supplemental O2 requirement  Consults called: none  Admission status: Inpatient     Vianne Bulls, MD [facility]  02/01/2021, 3:13 AM

## 2021-02-02 LAB — CBC WITH DIFFERENTIAL/PLATELET
Abs Immature Granulocytes: 0.04 10*3/uL (ref 0.00–0.07)
Basophils Absolute: 0.1 10*3/uL (ref 0.0–0.1)
Basophils Relative: 1 %
Eosinophils Absolute: 0.6 10*3/uL — ABNORMAL HIGH (ref 0.0–0.5)
Eosinophils Relative: 6 %
HCT: 36.9 % (ref 36.0–46.0)
Hemoglobin: 11.7 g/dL — ABNORMAL LOW (ref 12.0–15.0)
Immature Granulocytes: 0 %
Lymphocytes Relative: 17 %
Lymphs Abs: 1.7 10*3/uL (ref 0.7–4.0)
MCH: 28 pg (ref 26.0–34.0)
MCHC: 31.7 g/dL (ref 30.0–36.0)
MCV: 88.3 fL (ref 80.0–100.0)
Monocytes Absolute: 1.3 10*3/uL — ABNORMAL HIGH (ref 0.1–1.0)
Monocytes Relative: 13 %
Neutro Abs: 6.5 10*3/uL (ref 1.7–7.7)
Neutrophils Relative %: 63 %
Platelets: 214 10*3/uL (ref 150–400)
RBC: 4.18 MIL/uL (ref 3.87–5.11)
RDW: 15.9 % — ABNORMAL HIGH (ref 11.5–15.5)
WBC: 10.3 10*3/uL (ref 4.0–10.5)
nRBC: 0 % (ref 0.0–0.2)

## 2021-02-02 LAB — BASIC METABOLIC PANEL
Anion gap: 7 (ref 5–15)
BUN: 12 mg/dL (ref 8–23)
CO2: 22 mmol/L (ref 22–32)
Calcium: 8.5 mg/dL — ABNORMAL LOW (ref 8.9–10.3)
Chloride: 107 mmol/L (ref 98–111)
Creatinine, Ser: 0.47 mg/dL (ref 0.44–1.00)
GFR, Estimated: >60 mL/min (ref >60)
Glucose, Bld: 101 mg/dL — ABNORMAL HIGH (ref 70–99)
Potassium: 4.1 mmol/L (ref 3.5–5.1)
Sodium: 136 mmol/L (ref 135–145)

## 2021-02-02 LAB — URINE CULTURE

## 2021-02-02 LAB — MRSA NEXT GEN BY PCR, NASAL: MRSA by PCR Next Gen: DETECTED — AB

## 2021-02-02 MED ORDER — CHLORHEXIDINE GLUCONATE CLOTH 2 % EX PADS
6.0000 | MEDICATED_PAD | Freq: Every day | CUTANEOUS | Status: DC
  Administered 2021-02-02: 6 via TOPICAL

## 2021-02-02 MED ORDER — CEFDINIR 300 MG PO CAPS: 300.0000 mg | ORAL_CAPSULE | Freq: Two times a day (BID) | ORAL | 0 refills | Status: AC

## 2021-02-02 MED ORDER — MUPIROCIN 2 % EX OINT
1.0000 "application " | TOPICAL_OINTMENT | Freq: Two times a day (BID) | CUTANEOUS | Status: DC
  Administered 2021-02-02 (×2): 1 via NASAL
  Filled 2021-02-02: qty 22

## 2021-02-02 NOTE — Discharge Summary (Signed)
Physician Discharge Summary  [name] JEH:[id number] DOB: 05-29-1936 DOA: 01/31/2021  PCP: Seward Carol, MD  Admit date: 01/31/2021 Discharge date: 02/02/2021  Admitted From: Home Disposition:  Home  Discharge Condition:Stable CODE STATUS:FULL Diet recommendation: Soft diet  Brief/Interim Summary:  Patient is 85 year old female with history of cerebral palsy, cognitive impairment, chronic pain syndrome, hiatal hernia, hypertension, anxiety who presented from group home with complaints of abdomen, nausea and vomiting.  She was also reported to have dry cough, fever.  On presentation patient was febrile.  Lab work showed potassium of 3.2, elevated leukocytes, urine was notable for large leukocytes, positive nitrates.  Chest x-ray did not show any acute cardiopulmonary disease.  CT abdomen/pelvis showed large hiatal hernia but no acute intra-abdominal or pelvic abnormality.  Patient was admitted for the management of sepsis most likely from UTI, started on fluids and antibiotics..   Since she desaturated on room air,we did CT chest with contrast to rule out PE,came out to be negative for PE . Hypoxia cud be secondary to atelectasis,large hiatal hernia.  Patient remains hemodynamically stable.  Patient seen and examined the bedside today.She denies any abdominal pain, nausea or vomiting or chest pain or shortness of breath during my evaluation.  Patient is a poor historian.  Examination revealed clear lungs, nondistended, nontender abdomen. We will change the and IV antibiotics to oral.  Blood cultures have not seen any growth till date, urine culture showed multiple species.  Patient is medically stable for discharge back to skilled nursing facility today. TOC notified. I called the niece and updated the discharge plan.     Discharge Diagnoses:  Principal Problem:   Sepsis secondary to UTI [facility]) Active Problems:   Hypokalemia   Cerebral palsy ([place])   Hiatal hernia with gastroesophageal  reflux   Acute hypoxemic respiratory failure (HCC)   Anxiety   Cognitive impairment    Discharge Instructions  Discharge Instructions     Diet general   Complete by: As directed    Soft diet   Discharge instructions   Complete by: As directed    1)Please continue your home medications 2)Follow up with your PCP in a week   Increase activity slowly   Complete by: As directed       Allergies as of 02/02/2021       Reactions   Vancomycin Itching, Other (See Comments)   Itching, redness         Medication List     TAKE these medications    acetaminophen 500 MG tablet Commonly known as: TYLENOL Take 500 mg by mouth 2 (two) times daily.   atorvastatin 10 MG tablet Commonly known as: LIPITOR Take 10 mg by mouth daily.   bisacodyl 10 MG suppository Commonly known as: DULCOLAX Place 10 mg rectally every 3 (three) days.   busPIRone 15 MG tablet Commonly known as: BUSPAR Take 15 mg by mouth 3 (three) times daily.   cefdinir 300 MG capsule Commonly known as: OMNICEF Take 1 capsule (300 mg total) by mouth 2 (two) times daily for 4 days.   CHLORASEPTIC MT Use as directed in the mouth or throat. One spray to throat every 2 hours as needed for sore throad   cholecalciferol 1000 units tablet Commonly known as: VITAMIN D Take 2,000 Units by mouth daily.   citalopram 20 MG tablet Commonly known as: CELEXA Take 1 tablet (20 mg total) by mouth daily.   Cranberry 450 MG Caps Take 1 capsule by mouth 2 (two) times  daily.   diazepam 2 MG tablet Commonly known as: VALIUM Take 0.5 tablets (1 mg total) by mouth 2 (two) times daily.   diphenhydrAMINE 25 mg capsule Commonly known as: BENADRYL Take 25 mg by mouth every 8 (eight) hours as needed for itching.   docusate sodium 100 MG capsule Commonly known as: COLACE Take 100 mg by mouth daily.   ferrous sulfate 220 (44 Fe) MG/5ML solution Take 220 mg by mouth at bedtime.   fluticasone 50 MCG/ACT nasal  spray Commonly known as: FLONASE Place 1 spray into both nostrils daily.   guaifenesin 100 MG/5ML syrup Commonly known as: ROBITUSSIN Take 200 mg by mouth 4 (four) times daily as needed for cough.   HYDROcodone-acetaminophen 5-325 MG tablet Commonly known as: NORCO/VICODIN Take 1 tablet by mouth daily as needed for moderate pain. One tablet by mouth daily at lunch time as needed for breakthrough pain   hydrocortisone cream 1 % Apply 1 application topically 2 (two) times daily as needed for itching. Apply to affected areas twice daily as needed for itching   ibuprofen 200 MG tablet Commonly known as: ADVIL Take 200 mg by mouth 2 (two) times daily.   ketoconazole 2 % cream Commonly known as: NIZORAL Apply 1 application topically 2 (two) times a week. Wednesday and Saturday. Shampoo hair twice weekly for treatment of dry scalp   ketotifen 0.025 % ophthalmic solution Commonly known as: ZADITOR Place 1 drop into both eyes 2 (two) times daily as needed (allergies).   lidocaine 5 % Commonly known as: LIDODERM Place 1 patch onto the skin daily. Remove & Discard patch within 12 hours or as directed by MD   loperamide 2 MG tablet Commonly known as: IMODIUM A-D Take 2 mg by mouth 4 (four) times daily as needed for diarrhea or loose stools. Give 4mg  po prn loose stools. May repeat 2mg  po tid for additional loose stools   metoprolol tartrate 25 MG tablet Commonly known as: LOPRESSOR Take 1 tablet (25 mg total) by mouth 2 (two) times daily.   ondansetron 4 MG tablet Commonly known as: ZOFRAN Take 4 mg by mouth every 8 (eight) hours as needed for nausea or vomiting.   pantoprazole 40 MG tablet Commonly known as: Protonix Take 1 tablet (40 mg total) by mouth 2 (two) times daily.   polyethylene glycol 17 g packet Commonly known as: MIRALAX / GLYCOLAX Take 17 g by mouth daily.   saccharomyces boulardii 250 MG capsule Commonly known as: FLORASTOR Take 250 mg by mouth 2 (two) times  daily.   senna 8.6 MG tablet Commonly known as: SENOKOT Take 1 tablet by mouth daily as needed for constipation.   sucralfate 1 g tablet Commonly known as: CARAFATE Take 1 g by mouth 2 (two) times daily.   triamcinolone cream 0.1 % Commonly known as: KENALOG Apply 1 application topically 2 (two) times daily as needed (rash).       ASK your doctor about these medications    aspirin 81 MG EC tablet Take 1 tablet (81 mg total) by mouth daily. Swallow whole.        Follow-up Information     Seward Carol, MD. Schedule an appointment as soon as possible for a visit in 1 week(s).   Specialty: Internal Medicine Contact information: [address] [place] [address] [phone]                Allergies  Allergen Reactions   Vancomycin Itching and Other (See  Comments)    Itching, redness     Consultations: None   Procedures/Studies: CT Angio Chest Pulmonary Embolism (PE) W or WO Contrast  Result Date: 02/01/2021 CLINICAL DATA:  PE suspected, cough, fever, nausea, vomiting EXAM: CT ANGIOGRAPHY CHEST WITH CONTRAST TECHNIQUE: Multidetector CT imaging of the chest was performed using the standard protocol during bolus administration of intravenous contrast. Multiplanar CT image reconstructions and MIPs were obtained to evaluate the vascular anatomy. CONTRAST:  31mL OMNIPAQUE IOHEXOL 350 MG/ML SOLN COMPARISON:  09/19/2020 FINDINGS: Cardiovascular: Satisfactory opacification of the pulmonary arteries to the segmental level. No evidence of pulmonary embolism. Cardiomegaly. No pericardial effusion. Aortic atherosclerosis. Mediastinum/Nodes: No enlarged mediastinal, hilar, or axillary lymph nodes. Large hiatal hernia with intrathoracic position of the gastric body and fundus. Redemonstrated large, heterogeneous nodule of the left lobe of the thyroid, extending substernally and measuring at least 6.5 cm. Rightward deflection of the trachea. Lungs/Pleura: Trace  right pleural effusion and dependent consolidation, characteristic in appearance for round atelectasis. Underlying, bandlike scarring and or atelectasis of the bilateral lung bases. No pleural effusion or pneumothorax. Upper Abdomen: No acute abnormality. Musculoskeletal: No chest wall abnormality. No acute or significant osseous findings. Review of the MIP images confirms the above findings. IMPRESSION: 1. Negative examination for pulmonary embolism. 2. Trace right pleural effusion and dependent consolidation, characteristic in appearance for round atelectasis. Underlying, bandlike scarring and or atelectasis of the bilateral lung bases. 3. Large hiatal hernia with intrathoracic position of the gastric body and fundus. 4. Redemonstrated large, heterogeneous nodule of the left lobe of the thyroid, extending substernally and measuring at least 6.5 cm. Rightward deflection of the trachea. Recommend thyroid US if and when clinically appropriate (ref: J Am Coll Radiol. 2015 Feb;12(2): 143-50). 5. Aortic atherosclerosis. Aortic Atherosclerosis (ICD10-I70.0). Electronically Signed   By: Eddie Candle M.D.   On: 02/01/2021 10:19   CT ABDOMEN PELVIS W CONTRAST  Result Date: 01/31/2021 CLINICAL DATA:  Abdominal pain, fever EXAM: CT ABDOMEN AND PELVIS WITH CONTRAST TECHNIQUE: Multidetector CT imaging of the abdomen and pelvis was performed using the standard protocol following bolus administration of intravenous contrast. CONTRAST:  174mL OMNIPAQUE IOHEXOL 300 MG/ML  SOLN COMPARISON:  CT abdomen pelvis 09/19/2020. FINDINGS: Lower chest: Partially visualized large hiatal hernia containing the majority of the gastric lumen. Associated passive atelectasis of the left lower lobe. Right lower lobe bronchiectasis and bronchial wall thickening. Similar-appearing calcification at the right base. Associated nodular like 2.8 x 1.1 cm subpleural density (3:9). Hepatobiliary: No focal liver abnormality. No gallstones, gallbladder wall  thickening, or pericholecystic fluid. No biliary dilatation. Pancreas: No focal lesion. Normal pancreatic contour. No surrounding inflammatory changes. No main pancreatic ductal dilatation. Spleen: Normal in size without focal abnormality. Adrenals/Urinary Tract: No adrenal nodule bilaterally. Bilateral kidneys enhance symmetrically. 2.8 cm fluid density lesion within left kidney likely represents a simple renal cyst. Subcentimeter hypodensities are too small to characterize. No hydronephrosis. No hydroureter. The urinary bladder is unremarkable. On delayed imaging, there is no urothelial wall thickening and there are no filling defects in the opacified portions of the bilateral collecting systems or ureters. Stomach/Bowel: Stomach is within normal limits. No evidence of bowel wall thickening or dilatation. Appendix appears normal. Vascular/Lymphatic: No abdominal aorta or iliac aneurysm. Mild atherosclerotic plaque of the aorta and its branches. No abdominal, pelvic, or inguinal lymphadenopathy. Reproductive: Uterus and bilateral adnexa are unremarkable. Other: No intraperitoneal free fluid. No intraperitoneal free gas. No organized fluid collection. Musculoskeletal: No abdominal wall hernia or abnormality. Diffusely decreased bone density.  No suspicious lytic or blastic osseous lesions. No acute displaced fracture. Multilevel degenerative changes of the spine. Right femoral neck surgical hardware fusion. IMPRESSION: 1. Partially visualized large hiatal hernia containing the majority of the gastric lumen. 2. Otherwise no acute intra-abdominal or intrapelvic abnormality. Electronically Signed   By: Iven Finn M.D.   On: 01/31/2021 23:01   DG Chest Portable 1 View  Result Date: 01/31/2021 CLINICAL DATA:  Sepsis, febrile, tachypneic. EXAM: PORTABLE CHEST 1 VIEW COMPARISON:  Chest x-ray 09/21/2020, CT chest 09/19/2020 FINDINGS: The heart size and mediastinal contours are unchanged. No focal consolidation. No  pulmonary edema. No pleural effusion. No pneumothorax. No acute osseous abnormality. IMPRESSION: No active disease. Electronically Signed   By: Iven Finn M.D.   On: 01/31/2021 20:35      Subjective: Patient seen and examined the bedside this morning.  Hemodynamically stable for discharge today.  Discharge Exam: Vitals:   02/02/21 0551 02/02/21 0854  BP: (!) 141/64 132/69  Pulse: 67 91  Resp: 18 17  Temp: 98.5 F (36.9 C) 98 F (36.7 C)  SpO2: 95% 90%   Vitals:   02/01/21 2040 02/01/21 2206 02/02/21 0551 02/02/21 0854  BP:  (!) 124/58 (!) 141/64 132/69  Pulse:  75 67 91  Resp:  16 18 17   Temp: 98.3 F (36.8 C) 98.4 F (36.9 C) 98.5 F (36.9 C) 98 F (36.7 C)  TempSrc: Oral     SpO2:  93% 95% 90%  Weight:      Height:        General: Pt is alert, awake, not in acute distress Cardiovascular: RRR, S1/S2 +, no rubs, no gallops Respiratory: CTA bilaterally, no wheezing, no rhonchi Abdominal: Soft, NT, ND, bowel sounds + Extremities: no edema, no cyanosis    The results of significant diagnostics from this hospitalization (including imaging, microbiology, ancillary and laboratory) are listed below for reference.     Microbiology: Recent Results (from the past 240 hour(s))  Blood culture (routine x 2)     Status: None (Preliminary result)   Collection Time: 01/31/21  7:20 PM   Specimen: BLOOD  Result Value Ref Range Status   Specimen Description BLOOD BLOOD LEFT FOREARM  Final   Special Requests   Final    BOTTLES DRAWN AEROBIC AND ANAEROBIC Blood Culture adequate volume   Culture   Final    NO GROWTH 2 DAYS Performed at [facility], [address] [place], [place] [address]    Report Status PENDING  Incomplete  Blood culture (routine x 2)     Status: None (Preliminary result)   Collection Time: 01/31/21  8:24 PM   Specimen: BLOOD RIGHT WRIST  Result Value Ref Range Status   Specimen Description BLOOD RIGHT WRIST  Final   Special Requests   Final     BOTTLES DRAWN AEROBIC AND ANAEROBIC Blood Culture adequate volume   Culture   Final    NO GROWTH 2 DAYS Performed at [facility], [place] [address] [place], [place] [address]    Report Status PENDING  Incomplete  Resp Panel by RT-PCR (Flu A&B, Covid) Nasopharyngeal Swab     Status: None   Collection Time: 01/31/21  8:25 PM   Specimen: Nasopharyngeal Swab; Nasopharyngeal(NP) swabs in vial transport medium  Result Value Ref Range Status   SARS Coronavirus 2 by RT PCR NEGATIVE NEGATIVE Final    Comment: (NOTE) SARS-CoV-2 target nucleic acids are NOT DETECTED.  The SARS-CoV-2 RNA is generally detectable  in upper respiratory specimens during the acute phase of infection. The lowest concentration of SARS-CoV-2 viral copies this assay can detect is 138 copies/mL. A negative result does not preclude SARS-Cov-2 infection and should not be used as the sole basis for treatment or other patient management decisions. A negative result may occur with  improper specimen collection/handling, submission of specimen other than nasopharyngeal swab, presence of viral mutation(s) within the areas targeted by this assay, and inadequate number of viral copies(<138 copies/mL). A negative result must be combined with clinical observations, patient history, and epidemiological information. The expected result is Negative.  Fact Sheet for Patients:  EntrepreneurPulse.com.au  Fact Sheet for Healthcare Providers:  IncredibleEmployment.be  This test is no t yet approved or cleared by the [place] FDA and  has been authorized for detection and/or diagnosis of SARS-CoV-2 by FDA under an Emergency Use Authorization (EUA). This EUA will remain  in effect (meaning this test can be used) for the duration of the COVID-19 declaration under Section 564(b)(1) of the Act, 21 U.S.C.section 360bbb-3(b)(1), unless the authorization is terminated  or revoked sooner.        Influenza A by PCR NEGATIVE NEGATIVE Final   Influenza B by PCR NEGATIVE NEGATIVE Final    Comment: (NOTE) The Xpert Xpress SARS-CoV-2/FLU/RSV plus assay is intended as an aid in the diagnosis of influenza from Nasopharyngeal swab specimens and should not be used as a sole basis for treatment. Nasal washings and aspirates are unacceptable for Xpert Xpress SARS-CoV-2/FLU/RSV testing.  Fact Sheet for Patients: EntrepreneurPulse.com.au  Fact Sheet for Healthcare Providers: IncredibleEmployment.be  This test is not yet approved or cleared by the [place] FDA and has been authorized for detection and/or diagnosis of SARS-CoV-2 by FDA under an Emergency Use Authorization (EUA). This EUA will remain in effect (meaning this test can be used) for the duration of the COVID-19 declaration under Section 564(b)(1) of the Act, 21 U.S.C. section 360bbb-3(b)(1), unless the authorization is terminated or revoked.  Performed at [facility], [place] [address], [place], [place] [address]   Urine culture     Status: Abnormal   Collection Time: 02/01/21  1:51 AM   Specimen: Urine, Random  Result Value Ref Range Status   Specimen Description URINE, RANDOM  Final   Special Requests   Final    NONE Performed at [facility], [place] [address], [place], [place] [address]    Culture MULTIPLE SPECIES PRESENT, SUGGEST RECOLLECTION (A)  Final   Report Status 02/02/2021 FINAL  Final  MRSA Next Gen by PCR, Nasal     Status: Abnormal   Collection Time: 02/01/21 10:14 PM   Specimen: Nasal Mucosa; Nasal Swab  Result Value Ref Range Status   MRSA by PCR Next Gen DETECTED (A) NOT DETECTED Final    Comment: RESULT CALLED TO, READ BACK BY AND VERIFIED WITH: LANBERANG RN, AT [phone] 02/02/21 D. VANHOOK (NOTE) The GeneXpert MRSA Assay (FDA approved for NASAL specimens only), is one component of a comprehensive MRSA colonization surveillance program. It is not  intended to diagnose MRSA infection nor to guide or monitor treatment for MRSA infections. Test performance is not FDA approved in patients less than 48 years old. Performed at [facility], [place] [address], [place], [place] [address]      Labs: BNP (last 3 results) Recent Labs    09/21/20 0252  BNP 518.8*   Basic Metabolic Panel: Recent Labs  Lab 01/31/21 1920 02/01/21 0800 02/02/21 0059  NA 135 138 136  K 3.2* 3.5 4.1  CL 104 108 107  CO2 24 24 22   GLUCOSE 117* 102* 101*  BUN 11 6* 12  CREATININE 0.51 0.41* 0.47  CALCIUM 8.3* 8.8* 8.5*  MG  --  1.9  --    Liver Function Tests: Recent Labs  Lab 01/31/21 1920  AST 14*  ALT 12  ALKPHOS 69  BILITOT 0.9  PROT 6.1*  ALBUMIN 3.0*   Recent Labs  Lab 01/31/21 1920  LIPASE 25   No results for input(s): AMMONIA in the last 168 hours. CBC: Recent Labs  Lab 01/31/21 1920 02/01/21 0623 02/02/21 0059  WBC 14.2* 11.2* 10.3  NEUTROABS 11.5*  --  6.5  HGB 11.9* 12.6 11.7*  HCT 38.3 39.2 36.9  MCV 88.2 87.5 88.3  PLT 226 231 214   Cardiac Enzymes: No results for input(s): CKTOTAL, CKMB, CKMBINDEX, TROPONINI in the last 168 hours. BNP: Invalid input(s): POCBNP CBG: No results for input(s): GLUCAP in the last 168 hours. D-Dimer No results for input(s): DDIMER in the last 72 hours. Hgb A1c No results for input(s): HGBA1C in the last 72 hours. Lipid Profile No results for input(s): CHOL, HDL, LDLCALC, TRIG, CHOLHDL, LDLDIRECT in the last 72 hours. Thyroid function studies No results for input(s): TSH, T4TOTAL, T3FREE, THYROIDAB in the last 72 hours.  Invalid input(s): FREET3 Anemia work up No results for input(s): VITAMINB12, FOLATE, FERRITIN, TIBC, IRON, RETICCTPCT in the last 72 hours. Urinalysis    Component Value Date/Time   COLORURINE YELLOW 02/01/2021 0151   APPEARANCEUR HAZY (A) 02/01/2021 0151   LABSPEC 1.039 (H) 02/01/2021 0151   PHURINE 7.0 02/01/2021 0151   GLUCOSEU NEGATIVE 02/01/2021  0151   HGBUR MODERATE (A) 02/01/2021 0151   BILIRUBINUR NEGATIVE 02/01/2021 0151   KETONESUR NEGATIVE 02/01/2021 0151   PROTEINUR NEGATIVE 02/01/2021 0151   UROBILINOGEN 0.2 03/25/2012 1814   NITRITE POSITIVE (A) 02/01/2021 0151   LEUKOCYTESUR LARGE (A) 02/01/2021 0151   Sepsis Labs Invalid input(s): PROCALCITONIN,  WBC,  LACTICIDVEN Microbiology Recent Results (from the past 240 hour(s))  Blood culture (routine x 2)     Status: None (Preliminary result)   Collection Time: 01/31/21  7:20 PM   Specimen: BLOOD  Result Value Ref Range Status   Specimen Description BLOOD BLOOD LEFT FOREARM  Final   Special Requests   Final    BOTTLES DRAWN AEROBIC AND ANAEROBIC Blood Culture adequate volume   Culture   Final    NO GROWTH 2 DAYS Performed at [facility], [place] [address], [place], [place] [address]    Report Status PENDING  Incomplete  Blood culture (routine x 2)     Status: None (Preliminary result)   Collection Time: 01/31/21  8:24 PM   Specimen: BLOOD RIGHT WRIST  Result Value Ref Range Status   Specimen Description BLOOD RIGHT WRIST  Final   Special Requests   Final    BOTTLES DRAWN AEROBIC AND ANAEROBIC Blood Culture adequate volume   Culture   Final    NO GROWTH 2 DAYS Performed at [facility], [place] [address] [place], [place] [address]    Report Status PENDING  Incomplete  Resp Panel by RT-PCR (Flu A&B, Covid) Nasopharyngeal Swab     Status: None   Collection Time: 01/31/21  8:25 PM   Specimen: Nasopharyngeal Swab; Nasopharyngeal(NP) swabs in vial transport medium  Result Value Ref Range Status   SARS Coronavirus 2 by RT PCR NEGATIVE NEGATIVE Final  Comment: (NOTE) SARS-CoV-2 target nucleic acids are NOT DETECTED.  The SARS-CoV-2 RNA is generally detectable in upper respiratory specimens during the acute phase of infection. The lowest concentration of SARS-CoV-2 viral copies this assay can detect is 138 copies/mL. A negative result does not preclude  SARS-Cov-2 infection and should not be used as the sole basis for treatment or other patient management decisions. A negative result may occur with  improper specimen collection/handling, submission of specimen other than nasopharyngeal swab, presence of viral mutation(s) within the areas targeted by this assay, and inadequate number of viral copies(<138 copies/mL). A negative result must be combined with clinical observations, patient history, and epidemiological information. The expected result is Negative.  Fact Sheet for Patients:  EntrepreneurPulse.com.au  Fact Sheet for Healthcare Providers:  IncredibleEmployment.be  This test is no t yet approved or cleared by the [place] FDA and  has been authorized for detection and/or diagnosis of SARS-CoV-2 by FDA under an Emergency Use Authorization (EUA). This EUA will remain  in effect (meaning this test can be used) for the duration of the COVID-19 declaration under Section 564(b)(1) of the Act, 21 U.S.C.section 360bbb-3(b)(1), unless the authorization is terminated  or revoked sooner.       Influenza A by PCR NEGATIVE NEGATIVE Final   Influenza B by PCR NEGATIVE NEGATIVE Final    Comment: (NOTE) The Xpert Xpress SARS-CoV-2/FLU/RSV plus assay is intended as an aid in the diagnosis of influenza from Nasopharyngeal swab specimens and should not be used as a sole basis for treatment. Nasal washings and aspirates are unacceptable for Xpert Xpress SARS-CoV-2/FLU/RSV testing.  Fact Sheet for Patients: EntrepreneurPulse.com.au  Fact Sheet for Healthcare Providers: IncredibleEmployment.be  This test is not yet approved or cleared by the [place] FDA and has been authorized for detection and/or diagnosis of SARS-CoV-2 by FDA under an Emergency Use Authorization (EUA). This EUA will remain in effect (meaning this test can be used) for the duration of  the COVID-19 declaration under Section 564(b)(1) of the Act, 21 U.S.C. section 360bbb-3(b)(1), unless the authorization is terminated or revoked.  Performed at [facility], [place] [address], [place], [place] [address]   Urine culture     Status: Abnormal   Collection Time: 02/01/21  1:51 AM   Specimen: Urine, Random  Result Value Ref Range Status   Specimen Description URINE, RANDOM  Final   Special Requests   Final    NONE Performed at [facility], [place] [address] [place], [place] [address]    Culture MULTIPLE SPECIES PRESENT, SUGGEST RECOLLECTION (A)  Final   Report Status 02/02/2021 FINAL  Final  MRSA Next Gen by PCR, Nasal     Status: Abnormal   Collection Time: 02/01/21 10:14 PM   Specimen: Nasal Mucosa; Nasal Swab  Result Value Ref Range Status   MRSA by PCR Next Gen DETECTED (A) NOT DETECTED Final    Comment: RESULT CALLED TO, READ BACK BY AND VERIFIED WITH: LANBERANG RN, AT [phone] 02/02/21 D. VANHOOK (NOTE) The GeneXpert MRSA Assay (FDA approved for NASAL specimens only), is one component of a comprehensive MRSA colonization surveillance program. It is not intended to diagnose MRSA infection nor to guide or monitor treatment for MRSA infections. Test performance is not FDA approved in patients less than 59 years old. Performed at [facility], [place] [address] [place], [place] [address]     Please note: You were cared for by a hospitalist during your hospital stay. Once you  are discharged, your primary care physician will handle any further medical issues. Please note that NO REFILLS for any discharge medications will be authorized once you are discharged, as it is imperative that you return to your primary care physician (or establish a relationship with a primary care physician if you do not have one) for your post hospital discharge needs so that they can reassess your need for medications and monitor your lab values.    Time coordinating discharge:  40 minutes  SIGNED:   Shelly Coss, MD  [facility] 02/02/2021, 12:27 PM Pager [phone]  If 7PM-7AM, please contact night-coverage [email or web address] Password [email or web address]

## 2021-02-02 NOTE — Progress Notes (Signed)
Called [facility] to give report, talked to [place].

## 2021-02-02 NOTE — Progress Notes (Signed)
SATURATION QUALIFICATIONS: (This note is used to comply with regulatory documentation for home oxygen)  Patient Saturations on Room Air at Rest = 88%  Patient Saturation on 2 liter at rest = 91    Please briefly explain why patient needs home oxygen: Patient requires oxygen to saturate above 90

## 2021-02-02 NOTE — Progress Notes (Signed)
New Admission Note:   Arrival Method: from ED via [facility] Orientation: alert and oriented to self Telemetry: 5M03, CCMD notified Assessment: to be completed Skin: redness to sacral area, apply sacral foam, skin warm and dry  IV: LFA and  L posterior forearm ,nsl Pain: 0/10 Tubes: None Safety Measures: Safety Fall Prevention Plan has been discussed  Admission: to be completed 5 [place] Orientation: Patient has been oriented to the room, unit and staff.   Family: none at bedside  Orders to be reviewed and implemented. Will continue to monitor the patient. Call light has been placed within reach and bed alarm has been activated.

## 2021-02-02 NOTE — TOC Transition Note (Signed)
Transition of Care [facility]) - CM/SW Discharge Note   Patient Details  Name: [name] MRN: [id number] Date of Birth: [birth year]  Transition of Care [facility]) CM/SW Contact:  Sable Feil, LCSW Phone Number: 02/02/2021, 4:31 PM   Clinical Narrative:  Patient medically stable and returning to [facility], where she is a long-term care resident. Discharge clinicals transmitted to facility and ambulance transport arranged. Niece, Wendall Stade contacted [phone]) and informed of discharge and ambulance transport. She requested a copy of the d/c paperwork (AVS).    Final next level of care: [facility]) Barriers to Discharge: Barriers Resolved   Patient Goals and CMS Choice Patient states their goals for this hospitalization and ongoing recovery are:: FAmoly agrfeeable to patient returning to [facility].gov Compare Post Acute Care list provided to:: Other (Comment Required) (n/a as patient LTC at [facility]) Choice offered to / list presented to : NA  Discharge Placement              Patient chooses bed at: [facility] Patient to be transferred to facility by: Non-emergency ambulance transport Name of family member notified: Wendall Stade - niece - [phone] Patient and family notified of of transfer: 02/02/21  Discharge Plan and Services  Back to [facility]                                Social Determinants of Health (SDOH) Interventions  No SDOH interventions requested or needed at discharge.   Readmission Risk Interventions Readmission Risk Prevention Plan 04/28/2020  Transportation Screening Complete  PCP or Specialist Appt within 5-7 Days Not Complete  Not Complete comments SNF resident  Home Care Screening Not Complete  Home Care Screening Not Completed Comments SNF resident  Medication Review (RN CM) Referral to Pharmacy  Some recent data might be  hidden

## 2021-02-02 NOTE — Progress Notes (Signed)
DISCHARGE NOTE SNF [facility] to be discharged [facility] per MD order. Patient verbalized understanding.  Skin clean, dry and intact without evidence of skin break down, no evidence of skin tears noted. IV catheter discontinued intact. Site without signs and symptoms of complications. Dressing and pressure applied. Pt denies pain at the site currently. No complaints noted.  Patient free of lines, drains, and wounds.   Discharge packet assembled. An After Visit Summary (AVS) was printed and given to the EMS personnel. Patient escorted via stretcher and discharged to [facility] via ambulance. Report called to accepting facility; all questions and concerns addressed.   Berneta Levins, RN

## 2021-02-05 LAB — CULTURE, BLOOD (ROUTINE X 2)
Culture: NO GROWTH
Culture: NO GROWTH
Special Requests: ADEQUATE
Special Requests: ADEQUATE

## 2021-03-11 ENCOUNTER — Non-Acute Institutional Stay: Payer: Medicare Other

## 2021-03-11 ENCOUNTER — Other Ambulatory Visit: Payer: Self-pay

## 2021-03-11 VITALS — BP 116/72 | HR 71 | Temp 97.3°F | Resp 20

## 2021-03-11 DIAGNOSIS — Z515 Encounter for palliative care: Secondary | ICD-10-CM

## 2021-03-11 NOTE — Progress Notes (Signed)
PATIENT NAME: [name] DOB: [birth year] MRN: [id number]  PRIMARY CARE PROVIDER: Seward Carol, MD  RESPONSIBLE PARTY:  Acct ID - Guarantor Home Phone Work Phone Relationship Acct Type  [id number] [name], [name] [id number]  Self P/F     [place], [place], [place] [address]    PLAN OF CARE and INTERVENTIONS:               1.  GOALS OF CARE/ ADVANCE CARE PLANNING:  Comfort               2. PERSONAL EMERGENCY PLAN:  Activate 911 for emergencies.                3.  DISEASE STATUS:  Patient found in her bed asleep.  Awakens easily to verbal stimulation.  Patient states she is in no discomfort.  Noted upper/lower extremity contractures.  Patient is able to feed herself independently.  Her po intake ranges from 50-100% on average.  She is limited to total assistance with bed mobility.  Patient remains incontinent of bowel and bladder.  Her last bm was 03/10/21.  No complaints of constipation reported. Patient continues to be placed into her wheelchair almost daily.  No new concerns or complaints from resident or staff.   HISTORY OF PRESENT ILLNESS:  85 year old female with a hx of HTN.  Patient is being followed by Palliative Care every 8-12 weeks and PRN.  CODE STATUS: DNR ADVANCED DIRECTIVES: Yes MOST FORM: Yes PPS: 30%   PHYSICAL EXAM:   VITALS: Today's Vitals   03/11/21 1023  BP: 116/72  Pulse: 71  Resp: 20  Temp: (!) 97.3 F (36.3 C)  SpO2: 90%  PainSc: 0-No pain    LUNGS: clear to auscultation  respirations even and non-labored. CARDIAC: Cor RRR}  EXTREMITIES: - for edema SKIN: Skin color, texture, turgor normal. No rashes or lesions or normal  NEURO: positive for coordination problems and gait problems       Lorenza Burton, RN

## 2021-05-18 ENCOUNTER — Non-Acute Institutional Stay: Payer: Medicare Other

## 2021-05-18 ENCOUNTER — Other Ambulatory Visit: Payer: Self-pay

## 2021-05-18 VITALS — BP 140/60 | HR 62 | Resp 16

## 2021-05-18 DIAGNOSIS — Z515 Encounter for palliative care: Secondary | ICD-10-CM

## 2021-05-18 NOTE — Progress Notes (Signed)
PATIENT NAME: [name] DOB: 1936-01-12 MRN: [id number]  PRIMARY CARE PROVIDER: Seward Carol, MD  RESPONSIBLE PARTY:  Acct ID - Guarantor Home Phone Work Phone Relationship Acct Type  [id number] KINNEDY, MONGIELLO [phone]  Self P/F     [address], [place], [place] [address]  COMMUNITY PALLIATIVE CARE RN NOTE    PLAN OF CARE and INTERVENTION:  ADVANCE CARE PLANNING/GOALS OF CARE: Comfort PATIENT/CAREGIVER EDUCATION: Education regarding Palliative care services, safety. DISEASE STATUS:  RN Palliative care visit completed today. Met with patient [name] in her room. Patient is awake and aware, able to answer questions and voice concerns. Patient is out of bed to her w/c, able to share that she had attended Bible Study this morning. Frail, thin appearance with noted contractures to upper/lower extremities. Remains dependent on staff for bed mobility, transfers, dressing. Patient can feed herself. Meal intake remains good. Patient able to recall what she had for breakfast. Incontinent of bowel and bladder. Last BM 05/17/21.  Spoke with Suanne Marker LPN at facility. No concerns voiced at this time.    HISTORY OF PRESENT ILLNESS: This is an 85 year old female with diagnoses including but not limited to HTN, A fib, Cerebral Palsy, dysphagia, and spastic paraplegia. Palliative care will continue to follow for additional support, goals of care and complex decision making.  CODE STATUS: DNR in place MOST FORM: yes, indicating antibiotics as indicated, no tube feedings, no further hospitalizations unless needed to maintain comfort. PPS: 30%         PHYSICAL EXAM:   LUNGS: clear to auscultation  CARDIAC: Cor RRR}  EXTREMITIES: No edema noted SKIN: Skin color, texture, turgor normal. No rashes or lesions  NEURO: positive for coordination,weakness and gait problems       Cornelius Moras, RN

## 2021-08-31 ENCOUNTER — Other Ambulatory Visit: Payer: Self-pay

## 2021-08-31 ENCOUNTER — Non-Acute Institutional Stay: Payer: Medicare Other

## 2021-08-31 VITALS — BP 108/64 | HR 64 | Resp 18

## 2021-08-31 DIAGNOSIS — Z515 Encounter for palliative care: Secondary | ICD-10-CM

## 2021-09-02 ENCOUNTER — Non-Acute Institutional Stay: Payer: Medicare Other

## 2021-09-02 DIAGNOSIS — Z515 Encounter for palliative care: Secondary | ICD-10-CM

## 2021-09-03 NOTE — Progress Notes (Signed)
PATIENT NAME: [name] DOB: 11-21-1935 MRN: [id number]  PRIMARY CARE PROVIDER: Seward Carol, MD  RESPONSIBLE PARTY:  Acct ID - Guarantor Home Phone Work Phone Relationship Acct Type  [id number] [name], [name] [phone]  Self P/F     [address], [place], [place] [address]     Palliative care RN telephonic encounter completed with patient's niece, [name]. [name] expressed concerns over patient at [facility] as she has not been able to come up and see her. Provided [name] update on this RN's visit with patient including observing her eating her lunch mail and that she was well-groomed with her nails freshly painted. [name] shared that patient has reported to her on the phone that her back will itch and requires to have lotion apply. [name] encouraged to call with any further concerns. Cornelius Moras, RN

## 2021-09-03 NOTE — Progress Notes (Signed)
PATIENT NAME: [name] DOB: 09-01-1935 MRN: [id number]  PRIMARY CARE PROVIDER: Seward Carol, MD  RESPONSIBLE PARTY:  Acct ID - Guarantor Home Phone Work Phone Relationship Acct Type  [id number] [name] [phone]  Self P/F     [address] [place], [place] [address]   COMMUNITY PALLIATIVE CARE RN NOTE    PLAN OF CARE and INTERVENTION:  ADVANCE CARE PLANNING/GOALS OF CARE: Comfort, safety CAREGIVER EDUCATION: Palliative care, safety DISEASE STATUS: RN Palliative care visit completed at [facility]. Awake and aware. Verbally responsive to questions and engage in conversation. Denies any pain or shortness of breath. Observed patient feeding self in common area. No evidence of coughing with food or fluids. Meal intake remains good. Requires assistance on facility caregivers to complete her ADLs. Incontinent of bowel and bladder. No evidence of constipation. Last BM 08/31/19. Spoke with Sharon Evens CNA who shared that patient has been at her baseline.  HISTORY OF PRESENT ILLNESS: This is an 86 year old female with diagnosis Cerebral Palsy. She has a history of hypertension. Palliative care has been asked to follow for additional support, care and complex decision making.   CODE STATUS: DNR PPS: 30% Physical Exam:  Pulmonary: LS CTA, No audible cough or congestion. Respirations unlabored Cardiac: RRR, no edema Neurological: Progressive weakness Abdomen: soft, non-tender Skin: Dry, intact. No visible wounds.     Duration: 50min       [name], BSN

## 2022-02-17 ENCOUNTER — Encounter: Payer: Self-pay | Admitting: Family Medicine

## 2022-02-17 ENCOUNTER — Non-Acute Institutional Stay: Payer: Medicare Other | Admitting: Family Medicine

## 2022-02-17 VITALS — BP 130/84 | HR 64 | Wt 118.0 lb

## 2022-02-17 DIAGNOSIS — F0280 Dementia in other diseases classified elsewhere without behavioral disturbance: Secondary | ICD-10-CM | POA: Insufficient documentation

## 2022-02-17 DIAGNOSIS — Z515 Encounter for palliative care: Secondary | ICD-10-CM

## 2022-02-17 DIAGNOSIS — G301 Alzheimer's disease with late onset: Secondary | ICD-10-CM

## 2022-02-17 DIAGNOSIS — R1310 Dysphagia, unspecified: Secondary | ICD-10-CM

## 2022-02-17 HISTORY — DX: Dementia in other diseases classified elsewhere, unspecified severity, without behavioral disturbance, psychotic disturbance, mood disturbance, and anxiety: F02.80

## 2022-02-17 NOTE — Progress Notes (Signed)
[facility] Consult Note Telephone: [phone]  Fax: [phone]    Date of encounter: 02/17/22 9:46 AM PATIENT NAME: [name] [address] [facility] [address]   [phone] (home)  DOB: [birth year] MRN: [id number] PRIMARY CARE PROVIDER:    Seward Carol, MD,  [place] [address] [place] [address] [phone]  REFERRING PROVIDER:   Seward Carol, MD [address] [place],  [place] [address] [phone]  RESPONSIBLE PARTY:    Contact Information     Name Relation Home Work [place] Other [phone]  [phone]   [name] [phone]  [phone]   [name] [phone]  [phone]        [name] in assisted living facility. Palliative Care was asked to follow this patient by consultation request of  Sharon Carol, MD to address advance care planning and complex medical decision making. This is a follow up visit.                       ASSESSMENT, SYMPTOM MANAGEMENT AND PLAN / RECOMMENDATIONS:   Alzheimer's Dementia with Anxiety On no meds for dementia, has Buspirone 15 mg TID and Celexa to help with anxiety. Re-orient to date/time when possible. Caregiver (POA) educated on normal progression of dementia.   Dysphagia Hx of dysphagia with aspriration pneumonia in the past most recent 2022. Weight improved. Use of chin tuck if difficulty swallowing   Palliative Care Encounter Reviewed MOST for accuracy with [name] POA  Advance Care Planning/Goals of Care: Goals include to maximize quality of life and symptom management. Patient and health care surrogate gave their permission to discuss. Identification of a healthcare agent-HC POA Denise Review of an advance directive document-MOST . Decision not to resuscitate or to de-escalate disease focused treatments due to poor prognosis. CODE STATUS: MOST as  of 07/09/14: DNR/DNI, comfort measures Antibiotics on case by case time limited basis No IV fluids or feeding tube   Follow up Palliative Care Visit: Palliative care will continue to follow for complex medical decision making, advance care planning, and clarification of goals. Return 4 weeks or prn.   This visit was coded based on medical decision making (MDM).  PPS: 50%  HOSPICE ELIGIBILITY/DIAGNOSIS: TBD  Chief Complaint:  Palliative Care is following for chronic medical management in pt with cerebral palsy and dementia.  HISTORY OF PRESENT ILLNESS:  [name] is a 87 y.o. year old female with Cerebral Palsy and spastic paraplegia, Alzheimer's Dementia with anxiety, anemia, HTN, atrial fibrillation, HLD, hx of dysphagia/ulcer and GI bleed. She says it is Wednesday, 2010.  She states she has not had a bath since last Friday.  She states she is getting changed usually about twice day.  No falls, pain, dysuria, nausea, vomiting, difficulty with coughing or choking after eating or drinking.  She states not being offered fluids routinely.  She denies constipation, bright red or dark, tarry stools. Appetite is good and has gained a little weight.  She is having to strain to have a BM.  Requires assistance with bathing and dressing, able to feed self.  Has specialized WC for mobility  History obtained from review of EMR, discussion with  family, facility staff and/or Ms. Crall.  I reviewed available labs, medications, imaging, studies and related documents from the EMR with no new results since 02/02/2021.   ROS  General: NAD  ENMT: denies dysphagia Cardiovascular: denies chest pain, denies DOE Pulmonary: denies cough, denies increased SOB Abdomen: endorses good appetite, endorses constipation managed with Miralax, endorses Incontinence of bowel GU: denies dysuria, endorses Incontinence of urine MSK:  denies increased weakness,  no falls reported Skin: denies rashes or wounds, has purple  brown ecchymosis on lateral left knee Neurological: denies pain, denies insomnia Psych: Endorses positive mood Heme/lymph/immuno: denies abnormal bleeding  Physical Exam: Current and past weights: 100 lbs 01/2021, current weight 118 lbs as of 02/03/22 Constitutional: NAD, appears sleepy General: WN, WD ENMT: intact hearing, oral mucous membranes moist, dentition intact CV: S1S2, RRR-rate controlled today, no LE edema Pulmonary: CTAB, no increased work of breathing, no cough, room air Abdomen: normo-active BS + 4 quadrants, soft and non tender, no ascites GU: deferred MSK: no sarcopenia, moves upper extremities, self propels in specialized wc Neuro:  Generalized weakness of legs and trunk,  noted cognitive impairment Psych: non-anxious affect, A and O x 3 Hem/lymph/immuno: no widespread bruising   Thank you for the opportunity to participate in the care of Ms. Phaneuf.  The palliative care team will continue to follow. Please call our office at [phone] if we can be of additional assistance.   Sharon Conception, FNP -C  COVID-19 PATIENT SCREENING TOOL Asked and negative response unless otherwise noted:   Have you had symptoms of covid, tested positive or been in contact with someone with symptoms/positive test in the past 5-10 days?  No

## 2022-03-17 ENCOUNTER — Encounter: Payer: Self-pay | Admitting: Family Medicine

## 2022-03-17 ENCOUNTER — Non-Acute Institutional Stay: Payer: Medicare Other | Admitting: Family Medicine

## 2022-03-17 VITALS — BP 128/84 | HR 60 | Resp 18

## 2022-03-17 DIAGNOSIS — K5900 Constipation, unspecified: Secondary | ICD-10-CM

## 2022-03-17 DIAGNOSIS — F4322 Adjustment disorder with anxiety: Secondary | ICD-10-CM

## 2022-03-17 DIAGNOSIS — R1310 Dysphagia, unspecified: Secondary | ICD-10-CM

## 2022-03-17 DIAGNOSIS — G8929 Other chronic pain: Secondary | ICD-10-CM
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
## 2022-03-20 DIAGNOSIS — G8929 Other chronic pain: Secondary | ICD-10-CM | POA: Insufficient documentation

## 2022-03-20 DIAGNOSIS — F4322 Adjustment disorder with anxiety: Secondary | ICD-10-CM | POA: Insufficient documentation

## 2022-03-20 DIAGNOSIS — K5900 Constipation, unspecified: Secondary | ICD-10-CM | POA: Insufficient documentation

## 2022-03-23 ENCOUNTER — Emergency Department (HOSPITAL_COMMUNITY)
Admission: EM | Admit: 2022-03-23 | Discharge: 2022-03-23 | Disposition: A | Discharge status: Skilled Nursing Facility | Payer: Medicare Other | Attending: Emergency Medicine | Admitting: Emergency Medicine

## 2022-03-23 ENCOUNTER — Encounter (HOSPITAL_COMMUNITY): Payer: Self-pay

## 2022-03-23 ENCOUNTER — Emergency Department (HOSPITAL_COMMUNITY): Payer: Medicare Other

## 2022-03-23 DIAGNOSIS — M25572 Pain in left ankle and joints of left foot: Secondary | ICD-10-CM | POA: Insufficient documentation

## 2022-03-23 DIAGNOSIS — I10 Essential (primary) hypertension: Secondary | ICD-10-CM | POA: Insufficient documentation

## 2022-03-23 DIAGNOSIS — F0280 Dementia in other diseases classified elsewhere without behavioral disturbance: Secondary | ICD-10-CM | POA: Insufficient documentation

## 2022-03-23 DIAGNOSIS — G309 Alzheimer's disease, unspecified: Secondary | ICD-10-CM | POA: Diagnosis not present

## 2022-03-23 DIAGNOSIS — Z79899 Other long term (current) drug therapy: Secondary | ICD-10-CM | POA: Insufficient documentation

## 2022-03-23 NOTE — Discharge Instructions (Signed)
You were seen today for left ankle pain.  Your imaging studies did not show any fractures or broken bones.  Please follow-up with your primary care doctor in the next 2 to 3 days regarding today's visit and your symptoms. Please return to the emergency department if you experience any chest pain, difficulty breathing, abdominal pain, altered mental status, severe fever, or any other concerning symptom.  Thank you for allowing [place] to participate in your care.

## 2022-03-23 NOTE — ED Provider Notes (Signed)
[facility] EMERGENCY DEPARTMENT Provider Note   CSN: [id number] Arrival date & time: 03/23/22  9485     History  Chief Complaint  Patient presents with   Ankle Pain    KHRISTI SCHILLER is a 86 y.o. female PMH Alzheimer's dementia, hypertension, CP, cognitive impairment who is presenting with left ankle pain.  Patient reports that on Monday, she "bumped" the outside of her ankle against her wheelchair.  Since then she has had increasing bruising, swelling, pain.  Currently, she denies having significant discomfort.  She does not take a blood thinner.  Denies any other concerning symptoms at this time.  Patient's cousin arrived at bedside, contributes history.  She reports that patient has been working with physical therapy.  She has had trouble getting in and out of her wheelchair due to her cerebral palsy and requires full assistance for lifts and movement.  She says that on Monday, she noticed some swelling over the patient's left ankle, along with bruising and skin tears, after the patient had finished working with physical therapy.  She is unsure exactly how this happened.  Home Medications Prior to Admission medications   Medication Sig Start Date End Date Taking? Authorizing Provider  acetaminophen (TYLENOL) 500 MG tablet Take 500 mg by mouth 2 (two) times daily.    [provider]  aspirin EC 81 MG EC tablet Take 1 tablet (81 mg total) by mouth daily. Swallow whole. Patient not taking: No sig reported 09/24/20   Dessa Phi, DO  atorvastatin (LIPITOR) 10 MG tablet Take 10 mg by mouth daily.    [provider]  bisacodyl (DULCOLAX) 10 MG suppository Place 10 mg rectally every 3 (three) days.    [provider]  busPIRone (BUSPAR) 15 MG tablet Take 15 mg by mouth 3 (three) times daily.    [provider]  cholecalciferol (VITAMIN D) 1000 UNITS tablet Take 2,000 Units by mouth daily.    [provider]  citalopram (CELEXA)  20 MG tablet Take 1 tablet (20 mg total) by mouth daily. 09/24/20 04/09/21  Dessa Phi, DO  Cranberry 450 MG CAPS Take 1 capsule by mouth 2 (two) times daily.    [provider]  diazepam (VALIUM) 2 MG tablet Take 0.5 tablets (1 mg total) by mouth 2 (two) times daily. 09/24/20   Dessa Phi, DO  diphenhydrAMINE (BENADRYL) 25 mg capsule Take 25 mg by mouth every 8 (eight) hours as needed for itching.    [provider]  docusate sodium (COLACE) 100 MG capsule Take 100 mg by mouth daily.     [provider]  ferrous sulfate 220 (44 Fe) MG/5ML solution Take 220 mg by mouth at bedtime.    [provider]  fluticasone (FLONASE) 50 MCG/ACT nasal spray Place 1 spray into both nostrils daily.    [provider]  guaifenesin (ROBITUSSIN) 100 MG/5ML syrup Take 200 mg by mouth 4 (four) times daily as needed for cough.    [provider]  HYDROcodone-acetaminophen (NORCO/VICODIN) 5-325 MG tablet Take 1 tablet by mouth daily as needed for moderate pain. One tablet by mouth daily at lunch time as needed for breakthrough pain 09/24/20   Dessa Phi, DO  hydrocortisone cream 1 % Apply 1 application topically 2 (two) times daily as needed for itching. Apply to affected areas twice daily as needed for itching    [provider]  ibuprofen (ADVIL) 200 MG tablet Take 200 mg by mouth 2 (two) times daily.  [provider]  ketoconazole (NIZORAL) 2 % cream Apply 1 application topically 2 (two) times a week. Wednesday and Saturday. Shampoo hair twice weekly for treatment of dry scalp    [provider]  ketotifen (ZADITOR) 0.025 % ophthalmic solution Place 1 drop into both eyes 2 (two) times daily as needed (allergies).    [provider]  lidocaine (LIDODERM) 5 % Place 1 patch onto the skin daily. Remove & Discard patch within 12 hours or as directed by MD    [provider]  loperamide (IMODIUM A-D) 2 MG tablet Take 2  mg by mouth 4 (four) times daily as needed for diarrhea or loose stools. Give '4mg'$  po prn loose stools. May repeat '2mg'$  po tid for additional loose stools    [provider]  metoprolol tartrate (LOPRESSOR) 25 MG tablet Take 1 tablet (25 mg total) by mouth 2 (two) times daily. 09/24/20 04/09/21  Dessa Phi, DO  ondansetron (ZOFRAN) 4 MG tablet Take 4 mg by mouth every 8 (eight) hours as needed for nausea or vomiting.    [provider]  pantoprazole (PROTONIX) 40 MG tablet Take 1 tablet (40 mg total) by mouth 2 (two) times daily. 05/12/14   Burnard Bunting, MD  Phenol [facility]) Use as directed in the mouth or throat. One spray to throat every 2 hours as needed for sore throad    [provider]  polyethylene glycol (MIRALAX / GLYCOLAX) packet Take 17 g by mouth daily.    [provider]  saccharomyces boulardii (FLORASTOR) 250 MG capsule Take 250 mg by mouth 2 (two) times daily.    [provider]  senna (SENOKOT) 8.6 MG tablet Take 1 tablet by mouth daily as needed for constipation.    [provider]  sucralfate (CARAFATE) 1 g tablet Take 1 g by mouth 2 (two) times daily.    [provider]  triamcinolone cream (KENALOG) 0.1 % Apply 1 application topically 2 (two) times daily as needed (rash).    [provider]      Allergies    Vancomycin    Review of Systems   Review of Systems As in HPI.  Physical Exam Updated Vital Signs BP (!) 157/76 (BP Location: Left Arm)   Pulse 64   Temp 98.2 F (36.8 C) (Oral)   Resp 15   SpO2 92%  Physical Exam Vitals and nursing note reviewed.  Constitutional:      General: She is not in acute distress.    Appearance: Normal appearance. She is well-developed. She is not ill-appearing or diaphoretic.  HENT:     Head: Normocephalic and atraumatic.     Right Ear: External ear normal.     Left Ear: External ear normal.     Nose: Nose normal.     Mouth/Throat:     Mouth:  Mucous membranes are moist.  Cardiovascular:     Rate and Rhythm: Normal rate and regular rhythm.     Heart sounds: No murmur heard. Pulmonary:     Effort: Pulmonary effort is normal. No respiratory distress.     Breath sounds: Normal breath sounds.  Abdominal:     General: There is no distension.     Palpations: Abdomen is soft.     Tenderness: There is no abdominal tenderness.  Musculoskeletal:        General: Swelling and tenderness present.     Cervical back: Neck supple.     Right lower leg: No edema.  Left lower leg: No edema.     Comments: Swelling with ecchymosis over dorsum of left foot and left ankle.  2 hemostatic skin tears present along lateral malleolus.  Skin:    General: Skin is warm and dry.     Findings: Bruising present.  Neurological:     Mental Status: She is alert.        ED Results / Procedures / Treatments   Labs (all labs ordered are listed, but only abnormal results are displayed) Labs Reviewed - No data to display  EKG None  Radiology DG Foot 2 Views Left  Result Date: 03/23/2022 CLINICAL DATA:  Bruising and swelling EXAM: LEFT FOOT - 2 VIEW COMPARISON:  None Available. FINDINGS: Advanced osteopenia.  Mild degenerative change. Negative for fracture. IMPRESSION: Negative for fracture Electronically Signed   By: Franchot Gallo M.D.   On: 03/23/2022 11:05   DG Ankle 2 Views Left  Result Date: 03/23/2022 CLINICAL DATA:  Swelling and bruising. EXAM: LEFT ANKLE - 2 VIEW COMPARISON:  None Available. FINDINGS: Normal alignment no fracture.  Generalized osteopenia. IMPRESSION: Negative for fracture. Electronically Signed   By: Franchot Gallo M.D.   On: 03/23/2022 11:04    Procedures Procedures   Medications Ordered in ED Medications - No data to display  ED Course/ Medical Decision Making/ A&P Clinical Course as of 03/23/22 1222  Thu Mar 23, 2022  1127 Left foot x-ray reviewed interpreted no evidence of acute fracture noted on my  interpretation and radiologist interpretation concurs [DR]  1128 Left ankle x-ray reviewed interpreted no evidence of acute fracture noted [DR]    Clinical Course User Index [DR] Pattricia Boss, MD                           Medical Decision Making Amount and/or Complexity of Data Reviewed Radiology: ordered.   TYAH ACORD is a 86 y.o. female with significant PMHx cerebral palsy who presented to the ED with left ankle pain.  Vitals at presentation within normal limits.  Patient is hemodynamically stable, afebrile, satting well on room air.  Normal heart sounds.  Lungs clear to auscultation bilaterally.  Abdomen is soft, nontender to palpation.  No pitting edema.  Swelling of the left ankle as in photos with surrounding ecchymosis and skin tears.  Initial differential includes but is not limited to: Muscular strain, ankle sprain, fracture, hematoma  Imaging was pursued, notable for x-rays of left ankle and foot were negative for acute fracture per radiology read.  Patient declined pain medication.  Patient resides in a nursing facility, where she has assistance with mobility and ADLs.  At this time, I believe that the patient is safe and stable for discharge at this time with return precautions provided and a plan for follow-up care in place as needed.  The plan for this patient was discussed with Dr. Jeanell Sparrow, who voiced agreement and who oversaw evaluation and treatment of this patient.    Final Clinical Impression(s) / ED Diagnoses Final diagnoses:  Acute left ankle pain    Rx / DC Orders ED Discharge Orders     None         Faylene Million, MD 03/23/22 1226    Pattricia Boss, MD 03/24/22 1714

## 2022-03-23 NOTE — ED Triage Notes (Signed)
Lives in [facility]. Family noticed left ankle was bruised on Monday. Pt states she hit left ankle against her wheelchair. Here for further eval as requested by family. Left ankle has an open wound and is bruised.

## 2022-03-31 ENCOUNTER — Non-Acute Institutional Stay: Payer: Medicare Other | Admitting: Family Medicine

## 2022-04-04 ENCOUNTER — Non-Acute Institutional Stay: Payer: Medicare Other | Admitting: Family Medicine

## 2022-04-04 ENCOUNTER — Encounter: Payer: Self-pay | Admitting: Family Medicine

## 2022-04-04 VITALS — BP 144/78 | HR 78 | Resp 20

## 2022-04-04 DIAGNOSIS — S93412D Sprain of calcaneofibular ligament of left ankle, subsequent encounter: Secondary | ICD-10-CM

## 2022-04-04 NOTE — Progress Notes (Signed)
[facility] Consult Note Telephone: [phone]  Fax: [phone]    Date of encounter: 04/04/22 8:42 AM PATIENT NAME: [name] [address] [place] [facility] [address]   [phone] (home)  DOB: Jun 30, 1936 MRN: [id number] PRIMARY CARE PROVIDER:    Seward Carol, MD,  [place] [address] [place] [address] [phone]  REFERRING PROVIDER:   Seward Carol, MD [address] [address] [place],  [place] [address] [phone]  RESPONSIBLE PARTY:    Contact Information     Name Relation Home Work [place] Other [phone]  [phone]   [name],[name] [phone]  [phone]   Arely, Tinner [phone]  [phone]        I met face to face with patient in skilled nursing facility. Palliative Care was asked to follow this patient by consultation request of  Seward Carol, MD to address advance care planning and complex medical decision making. This is a follow up visit   ASSESSMENT , SYMPTOM MANAGEMENT AND PLAN / RECOMMENDATIONS:   Left Calcaneofibular Ankle Sprain, subsequent encounter Recommend elevation, continue Tylenol 650 mg Q 6 hrs prn pain. Apply ice 15 min on, 30 min off at least TID when in bed.    CODE STATUS: DNR/DNI with comfort measures Use of antibiotics on a case by case, time limited basis No V fluids or feeding tube.      Follow up Palliative Care Visit: Palliative care will continue to follow for complex medical decision making, advance care planning, and clarification of goals. Return 4 weeks or prn.   This visit was coded based on medical decision making (MDM).  PPS: 50%  HOSPICE ELIGIBILITY/DIAGNOSIS: TBD  Chief Complaint:  Palliative Care was requested to make an acute visit due to left foot pain and injury.  HISTORY OF PRESENT ILLNESS:  JAZ LANINGHAM is a 86 y.o. year old female with Cerebral Palsy and spastic paraplegia, Alzheimer's  Dementia with anxiety, anemia, HTN, atrial fibrillation, HLD, hx of dysphagia/ulcer and GI bleed.  SOB.  Lying in bed eating breakfast on arrival. Family caregiver indicated by phone message that pt had recent ED visit after left ankle injury of uncertain mechanism with skin laceration which she thought might have been becoming infected.  She thinks that antibiotics were started but wanted Palliative Care to check.  Foot xrays at ED negative for fracture. Denies congestion, cough, runny nose.  States she has mild pain continuously in left ankle.  She does not know how her foot became injured. Denies fall.  History obtained from review of EMR,  and interview with [name].  03/23/22: DX ankle 2 views left: FINDINGS: Normal alignment no fracture.  Generalized osteopenia.  IMPRESSION: Negative for fracture.  DX Left Foot 2 view: FINDINGS: Advanced osteopenia.  Mild degenerative change.   Negative for fracture.   IMPRESSION: Negative for fracture    I reviewed EMR for available labs, medications, imaging, studies and related documents.  There are no new records since last visit/Records reviewed and summarized above.   ROS General: NAD Cardiovascular: denies chest pain, denies DOE Pulmonary: denies cough, denies SOB Abdomen: endorses good appetite, denies constipation, endorses incontinence of bowel GU: denies dysuria, endorses incontinence of urine MSK:  denies increased weakness,  no falls reported Skin: denies rashes  Neurological: endorses mild pain which is somewhat continuous, denies insomnia Psych: Endorses positive mood  Physical Exam: Constitutional: NAD General: WNWD CV: S1S2, RRR, edema of left foot only Pulmonary:  CTAB, no increased work of breathing, no cough, room air Abdomen: normo-active BS + 4 quadrants, soft and non tender, no ascites GU: deferred MSK: no sarcopenia, moves all extremities, brown/purple fading ecchymoses of left dorsal foot from ankle to toes,  darkest around outer lateral malleolus with similar bruising 3-4 fingersbreadths proximal on left lower outer leg.  Has some increased tenderness underneath heel and over outer lateral malleolus but tolerates dorsiflexion, plantar flexion and eversion/inversion with small increase in pain Skin: warm and dry Neuro:  no generalized weakness,  noted cognitive impairment Psych: non-anxious affect, A and O x 3 Hem/lymph/immuno: no widespread bruising   Thank you for the opportunity to participate in the care of [name].  The palliative care team will continue to follow. Please call our office at [phone] if we can be of additional assistance.   Marijo Conception, FNP -C  COVID-19 PATIENT SCREENING TOOL Asked and negative response unless otherwise noted:   Have you had symptoms of covid, tested positive or been in contact with someone with symptoms/positive test in the past 5-10 days?  Covid in facility, pt asymptomatic

## 2022-04-08 DIAGNOSIS — S93412A Sprain of calcaneofibular ligament of left ankle, initial encounter: Secondary | ICD-10-CM | POA: Insufficient documentation

## 2022-04-14 ENCOUNTER — Encounter: Payer: Self-pay | Admitting: Family Medicine

## 2022-04-14 ENCOUNTER — Non-Acute Institutional Stay: Payer: Medicare Other | Admitting: Family Medicine

## 2022-04-14 VITALS — BP 142/80 | HR 56 | Resp 18

## 2022-04-14 DIAGNOSIS — S93412D Sprain of calcaneofibular ligament of left ankle, subsequent encounter: Secondary | ICD-10-CM

## 2022-04-14 NOTE — Progress Notes (Unsigned)
[facility] Consult Note Telephone: [phone]  Fax: [phone]    Date of encounter: 04/14/22 11:31 AM PATIENT NAME: [name] [address] [place] [facility] [address]   [phone] (home)  DOB: 1936/07/19 MRN: [id number] PRIMARY CARE PROVIDER:    Seward Carol, MD,  [place] [address] [place] [address] [phone]  REFERRING PROVIDER:   Seward Carol, MD [address] [place] [address] [place],  [place] [address] [phone]  RESPONSIBLE PARTY:    Contact Information     Name Relation Home Work [place] Other [phone]  [phone]   Cummings,Sharon Niece [phone]  [phone]   Cummings, Sharon [phone]  [phone]        I met face to face with patient in skilled nursing facility. Palliative Care was asked to follow this patient by consultation request of  Seward Carol, MD to address advance care planning and complex medical decision making. This is a follow up visit   ASSESSMENT , SYMPTOM MANAGEMENT AND PLAN / RECOMMENDATIONS:   Left Calcaneofibular Ankle Sprain, subsequent encounter Continued pain, ecchymosis and edema Requested repeat 2 view left foot xray with results called to this provider.    CODE STATUS: DNR/DNI with comfort measures Use of antibiotics on a case by case, time limited basis No V fluids or feeding tube.      Follow up Palliative Care Visit: Palliative care will continue to follow for complex medical decision making, advance care planning, and clarification of goals. Return 4 weeks or prn.   This visit was coded based on medical decision making (MDM).  PPS: 50%  HOSPICE ELIGIBILITY/DIAGNOSIS: TBD  Chief Complaint:  Palliative Care is following pt for chronic medical management in setting of CP with recent left foot injury and pain.  HISTORY OF PRESENT ILLNESS:  [name] is a 86 y.o. year old female with Cerebral Palsy and  spastic paraplegia, Alzheimer's Dementia with anxiety, anemia, HTN, atrial fibrillation, HLD, hx of dysphagia/ulcer and GI bleed.  SOB.  On arrival pt was sitting up in her wheelchair in the common area. Denies CP, SOB, nausea, vomiting, falls, cough, congestion or fever.  She continues to c/o pain in her left foot and ankle.  She denies any recent re-injury and states she thinks pain meds help some.  She is getting along with her new roommate but misses her cousin who is her PCG and HC POA.  History obtained from review of EMR,  and interview with [name].  Previous xrays of foot and ankle immediately after injury did not show any fractures.  I reviewed EMR for available labs, medications, imaging, studies and related documents.  There are no new records since last visit.   ROS General: NAD Cardiovascular: denies chest pain, denies DOE Pulmonary: denies cough, denies SOB Abdomen: endorses good appetite, denies constipation, endorses incontinence of bowel GU: denies dysuria, endorses incontinence of urine MSK:  denies increased weakness,  no falls reported Skin: denies rashes  Neurological: endorses mild pain which is somewhat continuous, denies insomnia Psych: Endorses positive mood  Physical Exam: Constitutional: NAD General: WN, WD CV: S1S2, RRR, edema of left foot only Pulmonary: CTAB, no increased work of breathing, no cough, room air Abdomen: normo-active BS + 4 quadrants, soft and non tender GU: deferred MSK: no sarcopenia, moves all extremities, faded brown ecchymosis of left dorsal foot from midfoot to toes. Continued tenderness to midfoot Skin: warm and dry Neuro:  no new generalized weakness (  baseline weakness of trunk from CP),  mild noted cognitive impairment Psych: non-anxious affect, A and O x 3 Hem/lymph/immuno: no widespread bruising   Thank you for the opportunity to participate in the care of [name].  The palliative care team will continue to follow. Please  call our office at [phone] if we can be of additional assistance.   Marijo Conception, FNP -C  COVID-19 PATIENT SCREENING TOOL Asked and negative response unless otherwise noted:   Have you had symptoms of covid, tested positive or been in contact with someone with symptoms/positive test in the past 5-10 days?  Covid in facility, pt asymptomatic

## 2022-04-21 ENCOUNTER — Telehealth: Payer: Self-pay | Admitting: Family Medicine

## 2022-04-21 NOTE — Telephone Encounter (Signed)
Received vm from pt's niece/HC POA Denise.  TCT Langley Gauss and advised that I had a visit with pt last Friday, she was up and dressed on arrival.  Pt continues to have some edema to left foot.  Pt has roommate at the facility and didn't indicate any difficulties with roommate.  Advised xray requested of foot but do not know if xray completed as have not received results.  Damaris Hippo FNP-C

## 2022-08-10 ENCOUNTER — Emergency Department (HOSPITAL_COMMUNITY): Payer: Medicare Other

## 2022-08-10 ENCOUNTER — Other Ambulatory Visit: Payer: Self-pay

## 2022-08-10 ENCOUNTER — Encounter (HOSPITAL_COMMUNITY): Payer: Self-pay

## 2022-08-10 ENCOUNTER — Emergency Department (HOSPITAL_COMMUNITY)
Admission: EM | Admit: 2022-08-10 | Discharge: 2022-08-11 | Disposition: A | Discharge status: Home / Self Care | Payer: Medicare Other | Attending: Emergency Medicine | Admitting: Emergency Medicine

## 2022-08-10 DIAGNOSIS — Z79899 Other long term (current) drug therapy: Secondary | ICD-10-CM | POA: Insufficient documentation

## 2022-08-10 DIAGNOSIS — I10 Essential (primary) hypertension: Secondary | ICD-10-CM | POA: Diagnosis not present

## 2022-08-10 DIAGNOSIS — Z7982 Long term (current) use of aspirin: Secondary | ICD-10-CM | POA: Insufficient documentation

## 2022-08-10 DIAGNOSIS — N390 Urinary tract infection, site not specified: Secondary | ICD-10-CM

## 2022-08-10 DIAGNOSIS — R404 Transient alteration of awareness: Secondary | ICD-10-CM

## 2022-08-10 DIAGNOSIS — R4182 Altered mental status, unspecified: Secondary | ICD-10-CM | POA: Diagnosis not present

## 2022-08-10 DIAGNOSIS — F0390 Unspecified dementia without behavioral disturbance: Secondary | ICD-10-CM | POA: Insufficient documentation

## 2022-08-10 LAB — COMPREHENSIVE METABOLIC PANEL
ALT: 6 U/L (ref 0–44)
AST: 19 U/L (ref 15–41)
Albumin: 3.2 g/dL — ABNORMAL LOW (ref 3.5–5.0)
Alkaline Phosphatase: 86 U/L (ref 38–126)
Anion gap: 12 (ref 5–15)
BUN: 16 mg/dL (ref 8–23)
CO2: 20 mmol/L — ABNORMAL LOW (ref 22–32)
Calcium: 8.8 mg/dL — ABNORMAL LOW (ref 8.9–10.3)
Chloride: 108 mmol/L (ref 98–111)
Creatinine, Ser: 0.61 mg/dL (ref 0.44–1.00)
GFR, Estimated: >60 mL/min (ref >60)
Glucose, Bld: 102 mg/dL — ABNORMAL HIGH (ref 70–99)
Potassium: 4.1 mmol/L (ref 3.5–5.1)
Sodium: 140 mmol/L (ref 135–145)
Total Bilirubin: 0.3 mg/dL (ref 0.3–1.2)
Total Protein: 5.9 g/dL — ABNORMAL LOW (ref 6.5–8.1)

## 2022-08-10 LAB — CBC WITH DIFFERENTIAL/PLATELET
Abs Immature Granulocytes: 0.06 10*3/uL (ref 0.00–0.07)
Basophils Absolute: 0.1 10*3/uL (ref 0.0–0.1)
Basophils Relative: 1 %
Eosinophils Absolute: 0.4 10*3/uL (ref 0.0–0.5)
Eosinophils Relative: 5 %
HCT: 44.8 % (ref 36.0–46.0)
Hemoglobin: 13.7 g/dL (ref 12.0–15.0)
Immature Granulocytes: 1 %
Lymphocytes Relative: 29 %
Lymphs Abs: 2.1 10*3/uL (ref 0.7–4.0)
MCH: 29.6 pg (ref 26.0–34.0)
MCHC: 30.6 g/dL (ref 30.0–36.0)
MCV: 96.8 fL (ref 80.0–100.0)
Monocytes Absolute: 0.7 10*3/uL (ref 0.1–1.0)
Monocytes Relative: 11 %
Neutro Abs: 3.7 10*3/uL (ref 1.7–7.7)
Neutrophils Relative %: 53 %
Platelets: 188 10*3/uL (ref 150–400)
RBC: 4.63 MIL/uL (ref 3.87–5.11)
RDW: 14 % (ref 11.5–15.5)
WBC: 7 10*3/uL (ref 4.0–10.5)
nRBC: 0 % (ref 0.0–0.2)

## 2022-08-10 NOTE — ED Notes (Signed)
Shift report received, assumed care of patient at this time 

## 2022-08-10 NOTE — ED Provider Notes (Signed)
[facility] EMERGENCY DEPARTMENT Provider Note   CSN: [id number] Arrival date & time: 08/10/22  2125     History  Chief Complaint  Patient presents with   Altered Mental Status    [name] is a 87 y.o. female.  Patient is an 87 year old female with a past medical history of dementia and conversational at baseline, hyperlipidemia and GERD presenting to the emergency department from her nursing home with altered mental status. Per EMS the nursing home reported that after dinner tonight she became lethargic and was not talking. She was found to be mildly hypoxic to 90% on room air.  The history is provided by the EMS personnel.  Altered Mental Status      Home Medications Prior to Admission medications   Medication Sig Start Date End Date Taking? Authorizing Provider  acetaminophen (TYLENOL) 500 MG tablet Take 500 mg by mouth 2 (two) times daily.    [provider]  aspirin EC 81 MG EC tablet Take 1 tablet (81 mg total) by mouth daily. Swallow whole. Patient not taking: No sig reported 09/24/20   Dessa Phi, DO  atorvastatin (LIPITOR) 10 MG tablet Take 10 mg by mouth daily.    [provider]  bisacodyl (DULCOLAX) 10 MG suppository Place 10 mg rectally every 3 (three) days.    [provider]  busPIRone (BUSPAR) 15 MG tablet Take 15 mg by mouth 3 (three) times daily.    [provider]  cholecalciferol (VITAMIN D) 1000 UNITS tablet Take 2,000 Units by mouth daily.    [provider]  citalopram (CELEXA) 20 MG tablet Take 1 tablet (20 mg total) by mouth daily. 09/24/20 04/09/21  Dessa Phi, DO  Cranberry 450 MG CAPS Take 1 capsule by mouth 2 (two) times daily.    [provider]  diazepam (VALIUM) 2 MG tablet Take 0.5 tablets (1 mg total) by mouth 2 (two) times daily. 09/24/20   Dessa Phi, DO  diphenhydrAMINE (BENADRYL) 25 mg capsule Take 25 mg by mouth every 8 (eight) hours as needed for itching.     [provider]  docusate sodium (COLACE) 100 MG capsule Take 100 mg by mouth daily.     [provider]  ferrous sulfate 220 (44 Fe) MG/5ML solution Take 220 mg by mouth at bedtime.    [provider]  fluticasone (FLONASE) 50 MCG/ACT nasal spray Place 1 spray into both nostrils daily.    [provider]  guaifenesin (ROBITUSSIN) 100 MG/5ML syrup Take 200 mg by mouth 4 (four) times daily as needed for cough.    [provider]  HYDROcodone-acetaminophen (NORCO/VICODIN) 5-325 MG tablet Take 1 tablet by mouth daily as needed for moderate pain. One tablet by mouth daily at lunch time as needed for breakthrough pain 09/24/20   Dessa Phi, DO  hydrocortisone cream 1 % Apply 1 application topically 2 (two) times daily as needed for itching. Apply to affected areas twice daily as needed for itching    [provider]  ibuprofen (ADVIL) 200 MG tablet Take 200 mg by mouth 2 (two) times daily.    [provider]  ketoconazole (NIZORAL) 2 % cream Apply 1 application topically 2 (two) times a week. Wednesday and Saturday. Shampoo hair twice weekly for treatment of dry scalp    [provider]  ketotifen (ZADITOR) 0.025 % ophthalmic solution Place 1 drop into both eyes 2 (two) times daily as needed (allergies).    [provider]  lidocaine (LIDODERM) 5 % Place 1 patch onto the skin daily. Remove & Discard patch within 12 hours or as directed by MD    [provider]  loperamide (IMODIUM A-D) 2 MG tablet Take 2 mg by mouth 4 (four) times daily as needed for diarrhea or loose stools. Give '4mg'$  po prn loose stools. May repeat '2mg'$  po tid for additional loose stools    [provider]  metoprolol tartrate (LOPRESSOR) 25 MG tablet Take 1 tablet (25 mg total) by mouth 2 (two) times daily. 09/24/20 04/09/21  Dessa Phi, DO  ondansetron (ZOFRAN) 4 MG tablet Take 4 mg by mouth every 8 (eight) hours as needed for nausea or  vomiting.    [provider]  pantoprazole (PROTONIX) 40 MG tablet Take 1 tablet (40 mg total) by mouth 2 (two) times daily. 05/12/14   Burnard Bunting, MD  Phenol [facility]) Use as directed in the mouth or throat. One spray to throat every 2 hours as needed for sore throad    [provider]  polyethylene glycol (MIRALAX / GLYCOLAX) packet Take 17 g by mouth daily.    [provider]  saccharomyces boulardii (FLORASTOR) 250 MG capsule Take 250 mg by mouth 2 (two) times daily.    [provider]  senna (SENOKOT) 8.6 MG tablet Take 1 tablet by mouth daily as needed for constipation.    [provider]  sucralfate (CARAFATE) 1 g tablet Take 1 g by mouth 2 (two) times daily.    [provider]  triamcinolone cream (KENALOG) 0.1 % Apply 1 application topically 2 (two) times daily as needed (rash).    [provider]      Allergies    Vancomycin    Review of Systems   Review of Systems  Physical Exam Updated Vital Signs BP (!) 162/77   Pulse (!) 54   Temp (!) 97.5 F (36.4 C) (Oral)   Resp 15   SpO2 92%  Physical Exam Vitals and nursing note reviewed.  Constitutional:      General: She is not in acute distress.    Comments: Drowsy but arousable to verbal stimuli  HENT:     Head: Normocephalic and atraumatic.     Nose: Nose normal.     Mouth/Throat:     Mouth: Mucous membranes are moist.     Pharynx: Oropharynx is clear.  Eyes:     Extraocular Movements: Extraocular movements intact.     Conjunctiva/sclera: Conjunctivae normal.  Cardiovascular:     Rate and Rhythm: Normal rate and regular rhythm.     Heart sounds: Normal heart sounds.  Pulmonary:     Effort: Pulmonary effort is normal.     Breath sounds: Normal breath sounds.  Abdominal:     General: Abdomen is flat.     Palpations: Abdomen is soft.     Tenderness: There is no abdominal tenderness.  Musculoskeletal:        General: No tenderness or  deformity.     Cervical back: Normal range of motion and neck supple.     Right lower leg: No edema.     Left lower leg: No edema.  Skin:    General: Skin is warm and dry.  Neurological:     General: No focal deficit present.     Comments: Oriented to person only  Psychiatric:        Mood and Affect: Mood normal.        Behavior: Behavior normal.  ED Results / Procedures / Treatments   Labs (all labs ordered are listed, but only abnormal results are displayed) Labs Reviewed  COMPREHENSIVE METABOLIC PANEL - Abnormal; Notable for the following components:      Result Value   CO2 20 (*)    Glucose, Bld 102 (*)    Calcium 8.8 (*)    Total Protein 5.9 (*)    Albumin 3.2 (*)    All other components within normal limits  CBC WITH DIFFERENTIAL/PLATELET  URINALYSIS, ROUTINE W REFLEX MICROSCOPIC    EKG EKG Interpretation  Date/Time:  Thursday August 10 2022 22:23:23 EST Ventricular Rate:  56 PR Interval:  222 QRS Duration: 92 QT Interval:  438 QTC Calculation: 423 R Axis:   42 Text Interpretation: Sinus rhythm Prolonged PR interval No significant change since last tracing Confirmed by Leanord Asal ([id number]) on 08/10/2022 11:05:32 PM  Radiology DG Chest Portable 1 View  Result Date: 08/10/2022 CLINICAL DATA:  Altered mental status EXAM: PORTABLE CHEST 1 VIEW COMPARISON:  Chest CT 02/01/2021 FINDINGS: Left upper mediastinal masslike density appears unchanged corresponding to left thyroid nodule. The heart is enlarged, unchanged. The aorta is ectatic. Large hiatal hernia again seen. There is no focal lung infiltrate, pleural effusion or pneumothorax. No acute fractures are seen. IMPRESSION: 1. No acute cardiopulmonary process. 2. Large hiatal hernia. Electronically Signed   By: Ronney Asters M.D.   On: 08/10/2022 22:39    Procedures Procedures    Medications Ordered in ED Medications - No data to display  ED Course/ Medical Decision Making/ A&P Clinical Course as of  08/10/22 2313  Thu Aug 10, 2022  2312 Patient signed out to Dr. Leonette Monarch in stable condition pending UA, CTH and reassessment. [VK]    Clinical Course User Index [VK] Kemper Durie, DO                           Medical Decision Making This patient presents to the ED with chief complaint(s) of AMS with pertinent past medical history of dementia, HTN which further complicates the presenting complaint. The complaint involves an extensive differential diagnosis and also carries with it a high risk of complications and morbidity.    The differential diagnosis includes ICH, mass effect, infection, dehydration, medication side effect, arrhythmia, anemia, electrolyte abnormality  Additional history obtained: Additional history obtained from EMS  Records reviewed prior ED records  ED Course and Reassessment: Upon patient's arrival to the emergency department she is drowsy but arousable to verbal stimuli.  She is oriented to self only and has no complaints at this time.  Attempted to call her nursing home for additional information but have and unable to get a hold of her nurse at this time.  She will have EKG, labs, urine and head CT performed to evaluate for cause of her altered mental status and she will be closely reassessed.  Independent labs interpretation:  The following labs were independently interpreted: Within normal range, UA pending  Independent visualization of imaging: pending     Amount and/or Complexity of Data Reviewed Labs: ordered. Radiology: ordered.          Final Clinical Impression(s) / ED Diagnoses Final diagnoses:  None    Rx / DC Orders ED Discharge Orders     None         Kemper Durie, DO 08/10/22 2313

## 2022-08-10 NOTE — ED Notes (Signed)
Patient transported to CT 

## 2022-08-10 NOTE — ED Triage Notes (Signed)
Patient arrives via EMS from [facility] due to AMS and lethargy. Per staff at facility patient became lethargic after dinner and found her spo2 to be at 90% on room air. Patient was placed on 2 liters and spo2 went up to 98%. Patient has history of dementia but is normally talkative and has not responded to any questions. BP 160/70 HR 50 and CBG 123.

## 2022-08-11 DIAGNOSIS — R4182 Altered mental status, unspecified: Secondary | ICD-10-CM | POA: Diagnosis not present

## 2022-08-11 LAB — URINALYSIS, ROUTINE W REFLEX MICROSCOPIC
Bilirubin Urine: NEGATIVE
Glucose, UA: NEGATIVE mg/dL
Hgb urine dipstick: NEGATIVE
Ketones, ur: NEGATIVE mg/dL
Nitrite: POSITIVE — AB
Protein, ur: NEGATIVE mg/dL
Specific Gravity, Urine: 1.011 (ref 1.005–1.030)
pH: 7 (ref 5.0–8.0)

## 2022-08-11 MED ORDER — CEPHALEXIN 500 MG PO CAPS: 500.0000 mg | ORAL_CAPSULE | Freq: Three times a day (TID) | ORAL | 0 refills | Status: AC

## 2022-08-11 MED ORDER — SODIUM CHLORIDE 0.9 % IV SOLN
1.0000 g | Freq: Once | INTRAVENOUS | Status: AC
  Administered 2022-08-11: 1 g via INTRAVENOUS
  Filled 2022-08-11: qty 10

## 2022-08-11 NOTE — ED Notes (Signed)
Called report to Merita Norton at [facility].

## 2022-08-11 NOTE — ED Provider Notes (Signed)
Here for AMS pending CTH and UA. Plan to reassess.  On my assessment, patient is alert and oriented to person and place.  She can tell me that she lives at [facility].  This appears to be a significant improvement from earlier.  CT head without acute findings.  Notable for chronic bilateral basal ganglia infarcts.  UA consistent with UTI which is likely the source of her confusion. She is not septic and appropriate for OP management.  She was given a dose of IV Rocephin and discharged with prescription for Keflex.  The patient appears reasonably screened and/or stabilized for discharge and I doubt any other medical condition or other [facility] requiring further screening, evaluation, or treatment in the ED at this time. I have discussed the findings, Dx and Tx plan with the patient/family who expressed understanding and agree(s) with the plan. Discharge instructions discussed at length. The patient/family was given strict return precautions who verbalized understanding of the instructions. No further questions at time of discharge.  Disposition: Discharge  Condition: Good  ED Discharge Orders          Ordered    cephALEXin (KEFLEX) 500 MG capsule  3 times daily        08/11/22 0346             Follow Up: Seward Carol, MD [address] [place] [address] [phone]  Call  to schedule an appointment for close follow up      Clovis Mankins, Grayce Sessions, MD 08/11/22 [phone]

## 2022-09-04 ENCOUNTER — Non-Acute Institutional Stay: Payer: Medicare Other | Admitting: Family Medicine

## 2022-09-04 ENCOUNTER — Encounter: Payer: Self-pay | Admitting: Family Medicine

## 2022-09-04 VITALS — BP 136/66 | HR 78 | Temp 97.7°F | Resp 18

## 2022-09-04 DIAGNOSIS — K219 Gastro-esophageal reflux disease without esophagitis: Secondary | ICD-10-CM

## 2022-09-04 DIAGNOSIS — K5900 Constipation, unspecified: Secondary | ICD-10-CM

## 2022-09-04 DIAGNOSIS — R1310 Dysphagia, unspecified: Secondary | ICD-10-CM

## 2022-09-04 NOTE — Progress Notes (Signed)
[facility] Consult Note Telephone: [phone]  Fax: [phone]    Date of encounter: 09/04/22 1:53 PM PATIENT NAME: [name] [address] [place] [address]   [phone] (home) [phone] (work) DOB: [birth year] MRN: [id number] PRIMARY CARE PROVIDER:    Seward Carol, MD,  [place] [address] [place] [address] [place] [address] [phone]  REFERRING PROVIDER:   Seward Carol, MD [address] [place],  [place] [address] [phone]  RESPONSIBLE PARTY:    Contact Information     Name Relation Home Work [place] Other [phone]  [phone]   [name],[name] [phone]    Cayla, Wiegand [phone]         I met face to face with patient in skilled nursing facility. Palliative Care was asked to follow this patient by consultation request of  Seward Carol, MD to address advance care planning and complex medical decision making. This is a follow up visit   ASSESSMENT , SYMPTOM MANAGEMENT AND PLAN / RECOMMENDATIONS:  Constipation, unspecified Recommend holding of Imodium and similar meds as most likely loose stools overflow diarrhea with pt on iron supplement. Recommend Miralax 17 gm 1-2 times daily until pt having soft, formed stool every 2-3 days.  Initially may have diarrhea until bulk of stool comes out and regular bowel habits become more normal. If pt cannot take in volume, would recommend Sennosides 2 po BID or liquid equivalent.  2.   Dysphagia/Hiatal hernia Most recent CXR indicated "large" hiatal hernia which puts pt at risk of reflux aspiration. Recommend upright for all intake. Encourage development of routine bowel regimen to help with intestinal peristalsis and emptying, decreasing amount available to reflux.     CODE STATUS: DNR/DNI with comfort measures Use of antibiotics on a case by case, time limited basis No IV  fluids or feeding tube.      Follow up Palliative Care Visit: Palliative care will continue to follow for complex medical decision making, advance care planning, and clarification of goals. Return 4 weeks or prn.   This visit was coded based on medical decision making (MDM).  PPS: 40%  HOSPICE ELIGIBILITY/DIAGNOSIS: TBD  Chief Complaint:  Palliative Care is following pt for chronic medical management with Dementia in setting of CP.  HISTORY OF PRESENT ILLNESS:  [name] is a 87 y.o. year old female with Cerebral Palsy and spastic paraplegia, Alzheimer's Dementia with anxiety, anemia, HTN, atrial fibrillation, HLD, hx of dysphagia/ulcer and GI bleed.  SOB.  On arrival pt was sitting up in her wheelchair in the common area eating her lunch.  She states being given "too much rice". Denies CP, SOB, nausea, vomiting, falls.   She is getting along with her new roommate but misses her cousin who is her PCG and [facility] POA but whom is staying away from facility until winter illnesses decline. She reports being given a "doll" who is down at the foot of her chair.  This doll appears to provide some comfort and peace to the patient.  She has an unusual hyperpigmented skin lesion to her right malar area and denies significant increase in size, denies pruritus and has noted no bleeding from the area. Facility staff indicates that pt's mood has been more labile recently and that she has been the aggressor in confrontations recently and difficult to redirect.               History obtained from review of  EMR,  discussion with facility staff and interview with Ms. Agena.  UA consistent with UTI on 08/10/22 with ED visit and AMS.  She was treated with IV Rocephin and d/c'd on Keflex.  CT head without acute findings, notable for chronic bilateral basal ganglia infarcts.     Latest Ref Rng & Units 08/10/2022   10:30 PM 02/02/2021   12:59 AM 02/01/2021    6:23 AM  CBC  WBC 4.0 - 10.5 K/uL 7.0  10.3  11.2    Hemoglobin 12.0 - 15.0 g/dL 13.7  11.7  12.6   Hematocrit 36.0 - 46.0 % 44.8  36.9  39.2   Platelets 150 - 400 K/uL 188  214  231       Latest Ref Rng & Units 08/10/2022   10:30 PM 02/02/2021   12:59 AM 02/01/2021    8:00 AM  CMP  Glucose 70 - 99 mg/dL 102  101  102   BUN 8 - 23 mg/dL '16  12  6   '$ Creatinine 0.44 - 1.00 mg/dL 0.61  0.47  0.41   Sodium 135 - 145 mmol/L 140  136  138   Potassium 3.5 - 5.1 mmol/L 4.1  4.1  3.5   Chloride 98 - 111 mmol/L 108  107  108   CO2 22 - 32 mmol/L '20  22  24   '$ Calcium 8.9 - 10.3 mg/dL 8.8  8.5  8.8   Total Protein 6.5 - 8.1 g/dL 5.9     Total Bilirubin 0.3 - 1.2 mg/dL 0.3     Alkaline Phos 38 - 126 U/L 86     AST 15 - 41 U/L 19     ALT 0 - 44 U/L 6         Latest Ref Rng & Units 08/10/2022   10:30 PM 01/31/2021    7:20 PM 09/22/2020    2:40 AM  Hepatic Function  Total Protein 6.5 - 8.1 g/dL 5.9  6.1  5.8   Albumin 3.5 - 5.0 g/dL 3.2  3.0  2.8   AST 15 - 41 U/L '19  14  15   '$ ALT 0 - 44 U/L '6  12  13   '$ Alk Phosphatase 38 - 126 U/L 86  69  60   Total Bilirubin 0.3 - 1.2 mg/dL 0.3  0.9  0.8    Component Ref Range & Units 3 wk ago (08/11/22) 1 yr ago (02/01/21) 1 yr ago (09/19/20) 10 yr ago (03/25/12) 10 yr ago (03/25/12) 10 yr ago (01/28/12) 10 yr ago (01/28/12)  Color, Urine YELLOW YELLOW YELLOW AMBER Abnormal  CM  YELLOW  YELLOW  APPearance CLEAR HAZY Abnormal  HAZY Abnormal  TURBID Abnormal   CLOUDY Abnormal   CLOUDY Abnormal   Specific Gravity, Urine 1.005 - 1.030 1.011 1.039 High  1.020  1.011  1.015  pH 5.0 - 8.0 7.0 7.0 5.0  7.0  7.5  Glucose, UA NEGATIVE mg/dL NEGATIVE NEGATIVE NEGATIVE  NEGATIVE  NEGATIVE  Hgb urine dipstick NEGATIVE NEGATIVE MODERATE Abnormal  MODERATE Abnormal   MODERATE Abnormal   TRACE Abnormal   Bilirubin Urine NEGATIVE NEGATIVE NEGATIVE NEGATIVE  NEGATIVE  NEGATIVE  Ketones, ur NEGATIVE mg/dL NEGATIVE NEGATIVE NEGATIVE  40 Abnormal   NEGATIVE  Protein, ur NEGATIVE mg/dL NEGATIVE NEGATIVE NEGATIVE  NEGATIVE  NEGATIVE   Nitrite NEGATIVE POSITIVE Abnormal  POSITIVE Abnormal  NEGATIVE  NEGATIVE  NEGATIVE  Leukocytes,Ua NEGATIVE MODERATE Abnormal  LARGE Abnormal  MODERATE Abnormal  RBC / HPF 0 - 5 RBC/hpf 6-10 6-10 0-5 0-2 R  0-2 R   WBC, UA 0 - 5 WBC/hpf 11-20 21-50 11-20 TOO NUMEROUS TO COUNT R  7-10 R   Bacteria, UA NONE SEEN MANY Abnormal  NONE SEEN MANY Abnormal  FEW Abnormal  R  FEW Abnormal  R   Squamous Epithelial / HPF 0 - 5 /HPF 6-10 0-5 R, CM 0-5 R   RARE R   Mucus  PRESENT        Comment: Performed at [facility] [address], [place], [place] [address]  Amorphous Crystal    PRESENT CM      Urine-Other     MUCOUS PRESENT     Urobilinogen, UA      0.2 R  0.2 R  Leukocytes, UA      LARGE Abnormal  R  LARGE Abnormal  R  Resulting Agency  [facility]   08/10/22 CT brain/head: FINDINGS: Brain: There is moderate severity cerebral atrophy with widening of the extra-axial spaces and ventricular dilatation. There are areas of decreased attenuation within the white matter tracts of the supratentorial brain, consistent with microvascular disease changes.   Small bilateral chronic basal ganglia lacunar infarcts are noted.   Vascular: No hyperdense vessel or unexpected calcification.   Skull: Normal. Negative for fracture or focal lesion.   Sinuses/Orbits: No acute finding.   Other: None.   IMPRESSION: 1. No acute intracranial abnormality. 2. Generalized cerebral atrophy and microvascular disease changes of the supratentorial brain. 3. Small bilateral chronic basal ganglia lacunar infarcts.   08/10/22 Portable CXR: FINDINGS: Left upper mediastinal masslike density appears unchanged corresponding to left thyroid nodule. The heart is enlarged, unchanged. The aorta is ectatic. Large hiatal hernia again seen. There is no focal lung infiltrate, pleural effusion or pneumothorax. No acute fractures are seen.    IMPRESSION: 1. No acute cardiopulmonary process. 2. Large hiatal hernia.  I reviewed EMR for available labs, medications, imaging, studies and related documents.    ROS General: NAD Cardiovascular: denies chest pain, denies DOE Pulmonary: denies cough, denies SOB Abdomen: endorses good appetite, endorses constipation, endorses incontinence of bowel GU: denies dysuria, endorses incontinence of urine MSK:  denies increased weakness above baseline,  no falls reported Skin: denies rashes  Neurological: denies pain or insomnia Psych: Endorses positive mood  Physical Exam: Weight 08/10/22 126.6 lbs Constitutional: NAD General: WN, WD CV: S1S2, RRR, no edema Pulmonary: CTAB, no increased work of breathing, no cough, room air Abdomen: normo-active BS + 4 quadrants, soft and non tender GU: deferred MSK: noted sarcopenia in all 4 limbs, muscle and subcutaneous fat wasting Skin: warm and dry, irregularly shaped brown lesion with skin projection from center similar to skin tag, right cheek inferior to right lower eyelid Neuro:  no new generalized weakness (baseline weakness of trunk from CP),  mild noted cognitive impairment Psych: non-anxious affect, A and Ox2 Hem/lymph/immuno: no widespread bruising   Thank you for the opportunity to participate in the care of Ms. Susi.  The palliative care team will continue to follow. Please call our office at [phone] if we can be of additional assistance.   Marijo Conception, FNP -C  COVID-19 PATIENT SCREENING TOOL Asked and negative response unless otherwise noted:   Have you had symptoms of covid, tested positive or been in contact with someone with symptoms/positive test  in the past 5-10 days?  unknown

## 2022-09-25 ENCOUNTER — Encounter (HOSPITAL_COMMUNITY): Payer: Self-pay

## 2022-09-25 ENCOUNTER — Emergency Department (HOSPITAL_COMMUNITY): Payer: Medicare Other

## 2022-09-25 ENCOUNTER — Emergency Department (HOSPITAL_COMMUNITY)
Admission: EM | Admit: 2022-09-25 | Discharge: 2022-09-26 | Disposition: A | Discharge status: Skilled Nursing Facility | Payer: Medicare Other | Attending: Emergency Medicine | Admitting: Emergency Medicine

## 2022-09-25 ENCOUNTER — Other Ambulatory Visit: Payer: Self-pay

## 2022-09-25 DIAGNOSIS — R519 Headache, unspecified: Secondary | ICD-10-CM | POA: Diagnosis present

## 2022-09-25 DIAGNOSIS — F0390 Unspecified dementia without behavioral disturbance: Secondary | ICD-10-CM | POA: Diagnosis not present

## 2022-09-25 DIAGNOSIS — S32048A Other fracture of fourth lumbar vertebra, initial encounter for closed fracture: Secondary | ICD-10-CM | POA: Diagnosis not present

## 2022-09-25 DIAGNOSIS — W06XXXA Fall from bed, initial encounter: Secondary | ICD-10-CM | POA: Diagnosis not present

## 2022-09-25 DIAGNOSIS — S32040A Wedge compression fracture of fourth lumbar vertebra, initial encounter for closed fracture: Secondary | ICD-10-CM

## 2022-09-25 LAB — COMPREHENSIVE METABOLIC PANEL
ALT: 12 U/L (ref 0–44)
AST: 19 U/L (ref 15–41)
Albumin: 3.5 g/dL (ref 3.5–5.0)
Alkaline Phosphatase: 68 U/L (ref 38–126)
Anion gap: 10 (ref 5–15)
BUN: 18 mg/dL (ref 8–23)
CO2: 23 mmol/L (ref 22–32)
Calcium: 8.9 mg/dL (ref 8.9–10.3)
Chloride: 105 mmol/L (ref 98–111)
Creatinine, Ser: 0.76 mg/dL (ref 0.44–1.00)
GFR, Estimated: >60 mL/min (ref >60)
Glucose, Bld: 103 mg/dL — ABNORMAL HIGH (ref 70–99)
Potassium: 3.7 mmol/L (ref 3.5–5.1)
Sodium: 138 mmol/L (ref 135–145)
Total Bilirubin: 0.4 mg/dL (ref 0.3–1.2)
Total Protein: 6.4 g/dL — ABNORMAL LOW (ref 6.5–8.1)

## 2022-09-25 LAB — CBC WITH DIFFERENTIAL/PLATELET
Abs Immature Granulocytes: 0.02 10*3/uL (ref 0.00–0.07)
Basophils Absolute: 0.1 10*3/uL (ref 0.0–0.1)
Basophils Relative: 1 %
Eosinophils Absolute: 0.2 10*3/uL (ref 0.0–0.5)
Eosinophils Relative: 3 %
HCT: 40.2 % (ref 36.0–46.0)
Hemoglobin: 12.7 g/dL (ref 12.0–15.0)
Immature Granulocytes: 0 %
Lymphocytes Relative: 25 %
Lymphs Abs: 2.2 10*3/uL (ref 0.7–4.0)
MCH: 28.9 pg (ref 26.0–34.0)
MCHC: 31.6 g/dL (ref 30.0–36.0)
MCV: 91.6 fL (ref 80.0–100.0)
Monocytes Absolute: 1 10*3/uL (ref 0.1–1.0)
Monocytes Relative: 11 %
Neutro Abs: 5.3 10*3/uL (ref 1.7–7.7)
Neutrophils Relative %: 60 %
Platelets: 267 10*3/uL (ref 150–400)
RBC: 4.39 MIL/uL (ref 3.87–5.11)
RDW: 14.2 % (ref 11.5–15.5)
WBC: 8.8 10*3/uL (ref 4.0–10.5)
nRBC: 0 % (ref 0.0–0.2)

## 2022-09-25 NOTE — ED Provider Triage Note (Signed)
Emergency Medicine Provider Triage Evaluation Note  JULIONA BRIGHTMAN , a 87 y.o. female  was evaluated in triage.  Pt complains of fall.  Patient is from [facility].  She had an unwitnessed fall and was noted to have scalp hematoma.  Patient had previous falls..  Review of Systems  Positive: fall Negative:   Physical Exam  BP (!) 149/80   Pulse (!) 54   Temp (!) 97.5 F (36.4 C)   Resp 16   SpO2 91%  Gen:   Demented Resp:  Normal effort  MSK:   Moves extremities without difficulty  Other:  + Lower lumbar tenderness  Medical Decision Making  Medically screening exam initiated at 9:23 PM.  Appropriate orders placed.  Verdie Mosher was informed that the remainder of the evaluation will be completed by another provider, this initial triage assessment does not replace that evaluation, and the importance of remaining in the ED until their evaluation is complete.  BLAIRE KRITIKOS is a 87 y.o. female here presenting with unwitnessed fall.  Patient is demented and is from a nursing facility.  Patient had an unwitnessed fall.  Obvious frontal scalp hematoma.  Plan to get CT head and cervical spine.  Patient also has lower lumbar tenderness so we will get a CT lumbar spine and basic labs as well.    Drenda Freeze, MD 09/25/22 2126

## 2022-09-25 NOTE — ED Triage Notes (Signed)
Pt arrived from [facility] via [facility] s/p unwitnessed fall out of bed. C/o mild head pain. Pt hit right side forehead noted to have golfball size pump knot.

## 2022-09-26 NOTE — ED Notes (Signed)
[facility] called  

## 2022-09-26 NOTE — ED Notes (Signed)
Report given to [place] at [facility], transport booked.

## 2022-09-26 NOTE — Discharge Instructions (Signed)
You have a compression fracture in your spine is not causing any impingement on any nerves or your spinal cord.  This does not appear to have occurred during the fall today but likely within the last month. Use the Tylenol that you have at home for your pain.  If that does not seem to be working then follow-up with your PCP for further management.  It should be relatively inconsequential.

## 2022-09-26 NOTE — ED Provider Notes (Signed)
[facility] Provider Note   CSN: [id number] Arrival date & time: 09/25/22  2102     History  Chief Complaint  Patient presents with   [name] is a 87 y.o. female.  Patient has dementia and does not remember all the details around her fall but does not think she passed out.  She states she thinks-year-old out of bed.  Hit the right forehead.  She states that this only place that she is really hurting.  Paperwork reviewed she is on a couple sedating medications also is DNR with limited interventions.   Fall       Home Medications Prior to Admission medications   Medication Sig Start Date End Date Taking? Authorizing Provider  acetaminophen (TYLENOL) 500 MG tablet Take 500 mg by mouth 2 (two) times daily.    [provider]  atorvastatin (LIPITOR) 10 MG tablet Take 10 mg by mouth daily.    [provider]  bisacodyl (DULCOLAX) 10 MG suppository Place 10 mg rectally every 3 (three) days.    [provider]  busPIRone (BUSPAR) 15 MG tablet Take 15 mg by mouth 3 (three) times daily.    [provider]  cholecalciferol (VITAMIN D) 1000 UNITS tablet Take 2,000 Units by mouth daily.    [provider]  citalopram (CELEXA) 20 MG tablet Take 1 tablet (20 mg total) by mouth daily. 09/24/20 04/09/21  Dessa Phi, DO  Cranberry 450 MG CAPS Take 1 capsule by mouth 2 (two) times daily.    [provider]  diazepam (VALIUM) 2 MG tablet Take 0.5 tablets (1 mg total) by mouth 2 (two) times daily. 09/24/20   Dessa Phi, DO  diphenhydrAMINE (BENADRYL) 25 mg capsule Take 25 mg by mouth every 8 (eight) hours as needed for itching.    [provider]  docusate sodium (COLACE) 100 MG capsule Take 100 mg by mouth daily.     [provider]  ferrous sulfate 220 (44 Fe) MG/5ML solution Take 220 mg by mouth at bedtime.    [provider]  fluticasone (FLONASE) 50  MCG/ACT nasal spray Place 1 spray into both nostrils daily.    [provider]  guaifenesin (ROBITUSSIN) 100 MG/5ML syrup Take 200 mg by mouth 4 (four) times daily as needed for cough.    [provider]  HYDROcodone-acetaminophen (NORCO/VICODIN) 5-325 MG tablet Take 1 tablet by mouth daily as needed for moderate pain. One tablet by mouth daily at lunch time as needed for breakthrough pain 09/24/20   Dessa Phi, DO  hydrocortisone cream 1 % Apply 1 application topically 2 (two) times daily as needed for itching. Apply to affected areas twice daily as needed for itching    [provider]  ibuprofen (ADVIL) 200 MG tablet Take 200 mg by mouth 2 (two) times daily.    [provider]  ketoconazole (NIZORAL) 2 % cream Apply 1 application topically 2 (two) times a week. Wednesday and Saturday. Shampoo hair twice weekly for treatment of dry scalp    [provider]  ketotifen (ZADITOR) 0.025 % ophthalmic solution Place 1 drop into both eyes 2 (two) times daily as needed (allergies).    [provider]  lidocaine (LIDODERM) 5 % Place 1 patch onto the skin daily. Remove & Discard patch within 12 hours or as directed by MD    [provider]  loperamide (IMODIUM A-D) 2 MG tablet Take 2 mg  by mouth 4 (four) times daily as needed for diarrhea or loose stools. Give '4mg'$  po prn loose stools. May repeat '2mg'$  po tid for additional loose stools    [provider]  metoprolol tartrate (LOPRESSOR) 25 MG tablet Take 1 tablet (25 mg total) by mouth 2 (two) times daily. 09/24/20 04/09/21  Dessa Phi, DO  ondansetron (ZOFRAN) 4 MG tablet Take 4 mg by mouth every 8 (eight) hours as needed for nausea or vomiting.    [provider]  pantoprazole (PROTONIX) 40 MG tablet Take 1 tablet (40 mg total) by mouth 2 (two) times daily. 05/12/14   Burnard Bunting, MD  Phenol [facility] MT) Use as directed in the mouth or throat. One spray to throat  every 2 hours as needed for sore throad    [provider]  polyethylene glycol (MIRALAX / GLYCOLAX) packet Take 17 g by mouth daily.    [provider]  saccharomyces boulardii (FLORASTOR) 250 MG capsule Take 250 mg by mouth 2 (two) times daily.    [provider]  senna (SENOKOT) 8.6 MG tablet Take 1 tablet by mouth daily as needed for constipation.    [provider]  sucralfate (CARAFATE) 1 g tablet Take 1 g by mouth 2 (two) times daily.    [provider]  triamcinolone cream (KENALOG) 0.1 % Apply 1 application topically 2 (two) times daily as needed (rash).    [provider]      Allergies    Vancomycin    Review of Systems   Review of Systems  Physical Exam Updated Vital Signs BP (!) 149/80   Pulse (!) 54   Temp (!) 97.5 F (36.4 C)   Resp 16   SpO2 91%  Physical Exam Vitals and nursing note reviewed.  Constitutional:      Appearance: She is well-developed.  HENT:     Head: Normocephalic and atraumatic.     Mouth/Throat:     Mouth: Mucous membranes are dry.  Eyes:     Pupils: Pupils are equal, round, and reactive to light.  Cardiovascular:     Rate and Rhythm: Normal rate and regular rhythm.  Pulmonary:     Effort: No respiratory distress.     Breath sounds: No stridor.  Abdominal:     General: Abdomen is flat. There is no distension.  Musculoskeletal:        General: No swelling or tenderness. Normal range of motion.     Cervical back: Normal range of motion.  Skin:    General: Skin is warm and dry.  Neurological:     General: No focal deficit present.     Mental Status: She is alert.     ED Results / Procedures / Treatments   Labs (all labs ordered are listed, but only abnormal results are displayed) Labs Reviewed  COMPREHENSIVE METABOLIC PANEL - Abnormal; Notable for the following components:      Result Value   Glucose, Bld 103 (*)    Total Protein 6.4 (*)    All other components within normal  limits  CBC WITH DIFFERENTIAL/PLATELET    EKG None  Radiology CT Lumbar Spine Wo Contrast  Result Date: 09/26/2022 CLINICAL DATA:  Unwitnessed fall. Back trauma, no prior imaging (Age >= 16y) EXAM: CT LUMBAR SPINE WITHOUT CONTRAST TECHNIQUE: Multidetector CT imaging of the lumbar spine was performed without intravenous contrast administration. Multiplanar CT image reconstructions were also generated. RADIATION DOSE REDUCTION: This exam was performed according to the departmental  dose-optimization program which includes automated exposure control, adjustment of the mA and/or kV according to patient size and/or use of iterative reconstruction technique. COMPARISON:  CT abdomen pelvis 01/31/2021 FINDINGS: Segmentation: 5 lumbar type vertebrae. Alignment: Normal. Vertebrae: The osseous structures are diffusely osteopenic. There is a probable subacute fracture of the L4 vertebral body with the transverse fracture plane extending through the superior aspect of vertebral body height involving both the anterior and posterior walls as well as the superior endplate. The inferior endplate appears intact. There is mild retropulsion of the a posterior wall as well as the posterosuperior aspect of the L4 vertebral body with resultant mild central canal stenosis. Sclerosis along the fracture margins suggests that this is at least subacute in nature. The posterior elements are intact and there is no associated facet ule subluxation or distraction. Approximately 25% loss of height. No other fracture identified. Remaining vertebral body height is preserved. Bulky bridging osteophytes at T11-L1 result in ankylosis of the vertebral bodies anteriorly. Paraspinal and other soft tissues: Mild paravertebral infiltration at L4 is present in keeping with a small amount of paravertebral edema or hemorrhage. 2 mm nonobstructing calculus noted within the right kidney. Disc levels: Intervertebral disc heights are preserved. Review of  the axial images demonstrates no significant neuroforaminal narrowing. There is, as noted above, mild central canal stenosis secondary to retropulsion of the posterior wall of L4. No canal hematoma. Broad-based disc bulge in combination with facet hypertrophy results in moderate central canal stenosis at L4-5. IMPRESSION: 1. Probable subacute fracture of the L4 vertebral body with the transverse fracture plane extending through the superior aspect of vertebral body height involving both the anterior and posterior walls as well as the superior endplate. The inferior endplate appears intact. 25% loss of height. There is mild retropulsion of the posterior wall as well as the posterosuperior aspect of the L4 vertebral body with resultant mild central canal stenosis. No canal hematoma. Posterior elements appear intact. Sclerosis along the fracture margins suggests that this is at least subacute in nature. 2. Diffuse osteopenia. 3. Degenerative disc and degenerative joint disease resulting in moderate central canal stenosis at L4-5. 4. 2 mm nonobstructing calculus within the right kidney. Electronically Signed   By: Fidela Salisbury M.D.   On: 09/26/2022 00:21   CT Cervical Spine Wo Contrast  Result Date: 09/26/2022 CLINICAL DATA:  Status post fall. EXAM: CT CERVICAL SPINE WITHOUT CONTRAST TECHNIQUE: Multidetector CT imaging of the cervical spine was performed without intravenous contrast. Multiplanar CT image reconstructions were also generated. RADIATION DOSE REDUCTION: This exam was performed according to the departmental dose-optimization program which includes automated exposure control, adjustment of the mA and/or kV according to patient size and/or use of iterative reconstruction technique. COMPARISON:  None Available. FINDINGS: Alignment: There is exaggerated lordosis of the cervical spine with approximately 1 mm anterolisthesis of the C4 vertebral body on C5. Skull base and vertebrae: No acute fracture. No  primary bone lesion or focal pathologic process. Soft tissues and spinal canal: No prevertebral fluid or swelling. No visible canal hematoma. Disc levels: Marked severity endplate sclerosis and mild anterior osteophyte formation are seen at the levels of C4-C5, C5-C6 and C6-C7. There is marked severity narrowing of the anterior atlantoaxial articulation. Marked severity intervertebral disc space narrowing is seen at C4-C5, C5-C6 and C6-C7, with moderate severity posterior intervertebral disc space narrowing at C3-C4. Bilateral marked severity multilevel facet joint hypertrophy is noted. Upper chest: A 3 mm pleural based noncalcified lung nodule is seen within the  anteromedial aspect of the left apex (axial CT image 84, CT series 5). Other: None. IMPRESSION: 1. No acute fracture or traumatic subluxation of the cervical spine. 2. Marked severity multilevel degenerative changes, as described above. 3. 3 mm pleural based noncalcified lung nodule within the anteromedial aspect of the left apex. Follow-up with nonemergent chest CT is recommended. Electronically Signed   By: Virgina Norfolk M.D.   On: 09/26/2022 00:12   CT HEAD WO CONTRAST (5MM)  Result Date: 09/26/2022 CLINICAL DATA:  Status post fall. EXAM: CT HEAD WITHOUT CONTRAST TECHNIQUE: Contiguous axial images were obtained from the base of the skull through the vertex without intravenous contrast. RADIATION DOSE REDUCTION: This exam was performed according to the departmental dose-optimization program which includes automated exposure control, adjustment of the mA and/or kV according to patient size and/or use of iterative reconstruction technique. COMPARISON:  August 10, 2022 FINDINGS: Brain: There is moderate severity cerebral atrophy with widening of the extra-axial spaces and ventricular dilatation. There are areas of decreased attenuation within the white matter tracts of the supratentorial brain, consistent with microvascular disease changes.  Vascular: No hyperdense vessel or unexpected calcification. Skull: Normal. Negative for fracture or focal lesion. Sinuses/Orbits: No acute finding. Other: There is mild right frontal scalp soft tissue swelling. IMPRESSION: 1. Mild right frontal scalp soft tissue swelling without evidence for an acute fracture or acute intracranial abnormality. 2. Generalized cerebral atrophy with widening of the extra-axial spaces and ventricular chronic white matter small vessel ischemic changes. Electronically Signed   By: Virgina Norfolk M.D.   On: 09/26/2022 00:07    Procedures Procedures    Medications Ordered in ED Medications - No data to display  ED Course/ Medical Decision Making/ A&P                             Medical Decision Making Will evaluate for any traumatic injury.  If these are negative can likely be discharged. CT lumbar spine consistent with likely a L4 compression fracture on my interpretation.  Radiology feels like it is more subacute in nature so unlikely related to the fall today.  She is at neurologic baseline.  Discussed these findings with her power of attorney.  Will discharge back to facility on Tylenol with PCP follow-up if pain is not controlled.  Final Clinical Impression(s) / ED Diagnoses Final diagnoses:  Compression fracture of L4 vertebra, initial encounter [facility])    Rx / [place] Orders ED Discharge Orders     None         Kaleen Rochette, Corene Cornea, MD 09/26/22 0030

## 2022-12-01 ENCOUNTER — Non-Acute Institutional Stay: Payer: Medicare Other | Admitting: Family Medicine

## 2022-12-01 ENCOUNTER — Encounter: Payer: Self-pay | Admitting: Family Medicine

## 2022-12-01 VITALS — BP 104/78 | HR 72 | Temp 98.0°F | Resp 18

## 2022-12-01 DIAGNOSIS — S32049S Unspecified fracture of fourth lumbar vertebra, sequela: Secondary | ICD-10-CM

## 2022-12-01 DIAGNOSIS — G301 Alzheimer's disease with late onset: Secondary | ICD-10-CM

## 2022-12-01 DIAGNOSIS — W19XXXS Unspecified fall, sequela: Secondary | ICD-10-CM

## 2022-12-01 NOTE — Progress Notes (Unsigned)
Therapist, nutritional Palliative Care Consult Note Telephone: [phone]  Fax: [phone]   Date of encounter: 12/01/22 11:07 AM PATIENT NAME: [name] [address] [place] [address]   [phone] (home) [phone] (work) DOB: [birth year] MRN: [id number] PRIMARY CARE PROVIDER:    Renford Dills, MD,  [address] [place] [address] [phone]  REFERRING PROVIDER:   Renford Dills, MD [address] [place],  [place] [address] [phone]  Emergency Contact:    Contact Information     Name Relation Home Work [place] Other [phone]  [phone]   [name],[name] [phone]    Burkhammer,Sandra Other [phone]        Health Care POA/Health Care Agent   I met face to face with patient in *** facility. Palliative Care was asked to follow this patient by consultation request of Renford Dills, MD to address advance care planning and complex medical decision making. This is a follow up visit.  ADVANCE CARE PLANNING/GOALS OF CARE: Patient/health care surrogate gave his/her permission to discuss.   PRESENT FOR DISCUSSION: GOALS: BARRIERS:   Our advance care planning conversation included a discussion about:    The value and importance of advance care planning  Experiences with loved ones who have been seriously ill or have died  Exploration of personal, cultural or spiritual beliefs that might influence medical decisions  Review, updating or creation of an advance directive document.  CODE STATUS: DNR/DNI with comfort measures Use of antibiotics on a case by case, time limited basis No IV fluids or feeding tube.  I spent ***   minutes providing this consultation with more than 50% of the time spent on counseling patient and coordinating communication with referring provider, staff/family, chart review and  documentation. --------------------------------------------------------------------------------------------------------------------------------------------------------------------------------------------  ASSESSMENT AND / RECOMMENDATIONS:  PPS:    Follow up Palliative Care Visit:  Palliative Care continuing to follow up by monitoring for changes in appetite, weight, functional and cognitive status for chronic disease progression and management in agreement with patient's stated goals of care. Next visit in *** weeks or prn.  This visit was coded based on medical decision making (MDM).  Chief Complaint  Palliative Care is following pt for chronic medical management with dementia in setting of Cerebral Palsy with spastic paraplegia.  HISTORY OF PRESENT ILLNESS: [name] is a 87 y.o. year old female with Cerebral Palsy with spastic paraplegia.  Pt with an unwitnessed fall in February with identification of L4 vertebral compression fracture noted in ED. Pt denies pain, SOB, nausea/vomiting, falls (since February).  Eating well. Denies constipation.   ACTIVITIES OF DAILY LIVING: CONTINENT OF BLADDER/BOWEL? No BATHING/DRESSING/FEEDING: Requires assist with all except feeding  MOBILITY:   WHEELCHAIR  APPETITE? Good  WEIGHT:  CURRENT PROBLEM LIST:  Patient Active Problem List   Diagnosis Date Noted   Sprain of calcaneofibular ligament of left ankle 04/08/2022   Adjustment disorder with anxious mood 03/20/2022   Chronic pain of left knee 03/20/2022   Constipation 03/20/2022   Alzheimer's dementia (HCC) 02/17/2022   Palliative care encounter 02/17/2022   Cognitive impairment    HTN (hypertension) 09/24/2020   AF (paroxysmal atrial fibrillation) (HCC) 09/24/2020   HLD (hyperlipidemia) 09/24/2020   Anxiety 09/24/2020   Thyroid nodule 09/24/2020   Choking episode    Dysphagia 11/30/2015   Hiatal hernia with gastroesophageal reflux 05/10/2014   Spastic paraplegia 05/06/2014    Diarrhea 03/25/2012   Cerebral palsy (HCC) 03/25/2012  H/O: GI bleed 03/25/2012   Esophageal ulcer 01/29/2012   Anemia 01/29/2012   PAST MEDICAL HISTORY:  Active Ambulatory Problems    Diagnosis Date Noted   Esophageal ulcer 01/29/2012   Anemia 01/29/2012   Diarrhea 03/25/2012   Cerebral palsy (HCC) 03/25/2012   H/O: GI bleed 03/25/2012   Spastic paraplegia 05/06/2014   Hiatal hernia with gastroesophageal reflux 05/10/2014   Dysphagia 11/30/2015   Choking episode    HTN (hypertension) 09/24/2020   AF (paroxysmal atrial fibrillation) (HCC) 09/24/2020   HLD (hyperlipidemia) 09/24/2020   Anxiety 09/24/2020   Thyroid nodule 09/24/2020   Cognitive impairment    Alzheimer's dementia (HCC) 02/17/2022   Palliative care encounter 02/17/2022   Adjustment disorder with anxious mood 03/20/2022   Chronic pain of left knee 03/20/2022   Constipation 03/20/2022   Sprain of calcaneofibular ligament of left ankle 04/08/2022   Resolved Ambulatory Problems    Diagnosis Date Noted   Acute upper GI bleed 01/28/2012   Hematemesis 01/28/2012   Hypokalemia 01/29/2012   Lower urinary tract infectious disease 03/25/2012   Colitis 03/26/2012   C. difficile colitis 03/27/2012   Aspiration pneumonia (HCC) 04/26/2020   Acute hypoxemic respiratory failure (HCC)    Sepsis (HCC) 09/19/2020   Sepsis secondary to UTI (HCC) 09/24/2020   Past Medical History:  Diagnosis Date   Hyperlipidemia    SOCIAL HX:  Social History   Tobacco Use   Smoking status: Never   Smokeless tobacco: Never  Substance Use Topics   Alcohol use: No   FAMILY HX: No family history on file.     Preferred Pharmacy: ALLERGIES:  Allergies  Allergen Reactions   Vancomycin Itching and Other (See Comments)    Itching, redness      PERTINENT MEDICATIONS:  Outpatient Encounter Medications as of 12/01/2022  Medication Sig   acetaminophen (TYLENOL) 500 MG tablet Take 500 mg by mouth 2 (two) times daily.   atorvastatin  (LIPITOR) 10 MG tablet Take 10 mg by mouth daily.   bisacodyl (DULCOLAX) 10 MG suppository Place 10 mg rectally every 3 (three) days.   busPIRone (BUSPAR) 15 MG tablet Take 15 mg by mouth 3 (three) times daily.   cholecalciferol (VITAMIN D) 1000 UNITS tablet Take 2,000 Units by mouth daily.   citalopram (CELEXA) 20 MG tablet Take 1 tablet (20 mg total) by mouth daily.   Cranberry 450 MG CAPS Take 1 capsule by mouth 2 (two) times daily.   diazepam (VALIUM) 2 MG tablet Take 0.5 tablets (1 mg total) by mouth 2 (two) times daily.   diphenhydrAMINE (BENADRYL) 25 mg capsule Take 25 mg by mouth every 8 (eight) hours as needed for itching.   docusate sodium (COLACE) 100 MG capsule Take 100 mg by mouth daily.    ferrous sulfate 220 (44 Fe) MG/5ML solution Take 220 mg by mouth at bedtime.   fluticasone (FLONASE) 50 MCG/ACT nasal spray Place 1 spray into both nostrils daily.   guaifenesin (ROBITUSSIN) 100 MG/5ML syrup Take 200 mg by mouth 4 (four) times daily as needed for cough.   HYDROcodone-acetaminophen (NORCO/VICODIN) 5-325 MG tablet Take 1 tablet by mouth daily as needed for moderate pain. One tablet by mouth daily at lunch time as needed for breakthrough pain   hydrocortisone cream 1 % Apply 1 application topically 2 (two) times daily as needed for itching. Apply to affected areas twice daily as needed for itching   ibuprofen (ADVIL) 200 MG tablet Take 200 mg by mouth 2 (two) times daily.  ketoconazole (NIZORAL) 2 % cream Apply 1 application topically 2 (two) times a week. Wednesday and Saturday. Shampoo hair twice weekly for treatment of dry scalp   ketotifen (ZADITOR) 0.025 % ophthalmic solution Place 1 drop into both eyes 2 (two) times daily as needed (allergies).   lidocaine (LIDODERM) 5 % Place 1 patch onto the skin daily. Remove & Discard patch within 12 hours or as directed by MD   loperamide (IMODIUM A-D) 2 MG tablet Take 2 mg by mouth 4 (four) times daily as needed for diarrhea or loose  stools. Give 4mg  po prn loose stools. May repeat 2mg  po tid for additional loose stools   metoprolol tartrate (LOPRESSOR) 25 MG tablet Take 1 tablet (25 mg total) by mouth 2 (two) times daily.   ondansetron (ZOFRAN) 4 MG tablet Take 4 mg by mouth every 8 (eight) hours as needed for nausea or vomiting.   pantoprazole (PROTONIX) 40 MG tablet Take 1 tablet (40 mg total) by mouth 2 (two) times daily.   Phenol (CHLORASEPTIC MT) Use as directed in the mouth or throat. One spray to throat every 2 hours as needed for sore throad   polyethylene glycol (MIRALAX / GLYCOLAX) packet Take 17 g by mouth daily.   saccharomyces boulardii (FLORASTOR) 250 MG capsule Take 250 mg by mouth 2 (two) times daily.   senna (SENOKOT) 8.6 MG tablet Take 1 tablet by mouth daily as needed for constipation.   sucralfate (CARAFATE) 1 g tablet Take 1 g by mouth 2 (two) times daily.   triamcinolone cream (KENALOG) 0.1 % Apply 1 application topically 2 (two) times daily as needed (rash).   No facility-administered encounter medications on file as of 12/01/2022.    History obtained from review of EMR, discussion with facility staff/caregiver and/or patient.   CBC    Component Value Date/Time   WBC 8.8 09/25/2022 2116   RBC 4.39 09/25/2022 2116   HGB 12.7 09/25/2022 2116   HGB 13.3 11/30/2015 1307   HCT 40.2 09/25/2022 2116   HCT 41.3 11/30/2015 1307   PLT 267 09/25/2022 2116   PLT 220 11/30/2015 1307   MCV 91.6 09/25/2022 2116   MCV 89 11/30/2015 1307   MCH 28.9 09/25/2022 2116   MCHC 31.6 09/25/2022 2116   RDW 14.2 09/25/2022 2116   RDW 14.3 11/30/2015 1307   LYMPHSABS 2.2 09/25/2022 2116   MONOABS 1.0 09/25/2022 2116   EOSABS 0.2 09/25/2022 2116   BASOSABS 0.1 09/25/2022 2116    CMP    Latest Ref Rng & Units 09/25/2022    9:16 PM 08/10/2022   10:30 PM 02/02/2021   12:59 AM  CMP  Glucose 70 - 99 mg/dL 161  096  045   BUN 8 - 23 mg/dL 18  16  12    Creatinine 0.44 - 1.00 mg/dL 4.09  8.11  9.14   Sodium 135 -  145 mmol/L 138  140  136   Potassium 3.5 - 5.1 mmol/L 3.7  4.1  4.1   Chloride 98 - 111 mmol/L 105  108  107   CO2 22 - 32 mmol/L 23  20  22    Calcium 8.9 - 10.3 mg/dL 8.9  8.8  8.5   Total Protein 6.5 - 8.1 g/dL 6.4  5.9    Total Bilirubin 0.3 - 1.2 mg/dL 0.4  0.3    Alkaline Phos 38 - 126 U/L 68  86    AST 15 - 41 U/L 19  19    ALT 0 -  44 U/L 12  6      LFTs    Latest Ref Rng & Units 09/25/2022    9:16 PM 08/10/2022   10:30 PM 01/31/2021    7:20 PM  Hepatic Function  Total Protein 6.5 - 8.1 g/dL 6.4  5.9  6.1   Albumin 3.5 - 5.0 g/dL 3.5  3.2  3.0   AST 15 - 41 U/L 19  19  14    ALT 0 - 44 U/L 12  6  12    Alk Phosphatase 38 - 126 U/L 68  86  69   Total Bilirubin 0.3 - 1.2 mg/dL 0.4  0.3  0.9      I reviewed available labs, medications, imaging, studies and related documents from the EMR.  Records reviewed and summarized above.   Physical Exam: GENERAL: NAD LUNGS: CTAB, no increased work of breathing, room air CARDIAC:  S1S2, RRR with no MRG, No edema/cyanosis ABD:  Normo-active BS x 4 quads, soft, non-tender EXTREMITIES: Strength equal but weaker in BLE, Yes muscle atrophy/subcutaneous fat loss in BLE NEURO:  Noted BLE weakness and mod cognitive impairment.  PSYCH:  non-anxious affect, A & O x 2  Thank you for the opportunity to participate in the care of Aquilla Hacker. Please call our main office at [phone] if we can be of additional assistance.    Joycelyn Man FNP-C  [email or web address]  Phone:  [phone]

## 2022-12-03 ENCOUNTER — Encounter: Payer: Self-pay | Admitting: Family Medicine

## 2022-12-03 DIAGNOSIS — S32049A Unspecified fracture of fourth lumbar vertebra, initial encounter for closed fracture: Secondary | ICD-10-CM | POA: Insufficient documentation

## 2022-12-03 DIAGNOSIS — W19XXXA Unspecified fall, initial encounter: Secondary | ICD-10-CM | POA: Insufficient documentation

## 2023-03-11 ENCOUNTER — Emergency Department (HOSPITAL_COMMUNITY): Payer: Medicare Other

## 2023-03-11 ENCOUNTER — Encounter (HOSPITAL_COMMUNITY): Payer: Self-pay

## 2023-03-11 ENCOUNTER — Other Ambulatory Visit: Payer: Self-pay

## 2023-03-11 ENCOUNTER — Emergency Department (HOSPITAL_COMMUNITY)
Admission: EM | Admit: 2023-03-11 | Discharge: 2023-03-11 | Disposition: A | Discharge status: Home / Self Care | Payer: Medicare Other | Attending: Emergency Medicine | Admitting: Emergency Medicine

## 2023-03-11 DIAGNOSIS — S0990XA Unspecified injury of head, initial encounter: Secondary | ICD-10-CM | POA: Diagnosis present

## 2023-03-11 DIAGNOSIS — S80212A Abrasion, left knee, initial encounter: Secondary | ICD-10-CM | POA: Insufficient documentation

## 2023-03-11 DIAGNOSIS — S0003XA Contusion of scalp, initial encounter: Secondary | ICD-10-CM

## 2023-03-11 DIAGNOSIS — W050XXA Fall from non-moving wheelchair, initial encounter: Secondary | ICD-10-CM | POA: Diagnosis not present

## 2023-03-11 DIAGNOSIS — W19XXXA Unspecified fall, initial encounter: Secondary | ICD-10-CM

## 2023-03-11 NOTE — ED Triage Notes (Signed)
Patient BIB EMS from [facility] for having slid off her w/c and hit the back of her head on the floor.  Fall was witnessed.  Patient reports pain in the back of her head but she is at baseline per EMS at this time.  VS in transit BP 132/54, HR 73, 93% on RA.  Patient reportedly taking ASA

## 2023-03-11 NOTE — ED Notes (Signed)
[facility] notified for transport 

## 2023-03-11 NOTE — Discharge Instructions (Addendum)
Patient was seen in the emergency department and evaluated for fall.  She had a CT of her head and her neck and x-[name] of her knee that did not show any fractures or acute abnormality. Please return to the emergency department if there are new or worsening symptoms otherwise follow-up with her doctor this week

## 2023-03-11 NOTE — ED Provider Notes (Signed)
[place] EMERGENCY DEPARTMENT AT [facility] Provider Note   CSN: [id number] Arrival date & time: 03/11/23  1105     History  Chief Complaint  Patient presents with   [name] is a 87 y.o. female.  HPI 87 year old female brought in by EMS from [place] with report of fall from her wheelchair today.  Was witnessed fall.  She reportedly slid from her wheelchair and hit the back of her head on the floor.  There is no loss of consciousness.  She is complaining of some pain in the back of her head.  No report of blood thinners.     Home Medications Prior to Admission medications   Medication Sig Start Date End Date Taking? Authorizing Provider  acetaminophen (TYLENOL) 500 MG tablet Take 500 mg by mouth 2 (two) times daily.    [provider]  atorvastatin (LIPITOR) 10 MG tablet Take 10 mg by mouth daily.    [provider]  bisacodyl (DULCOLAX) 10 MG suppository Place 10 mg rectally every 3 (three) days.    [provider]  busPIRone (BUSPAR) 15 MG tablet Take 15 mg by mouth 3 (three) times daily.    [provider]  cholecalciferol (VITAMIN D) 1000 UNITS tablet Take 2,000 Units by mouth daily.    [provider]  citalopram (CELEXA) 20 MG tablet Take 1 tablet (20 mg total) by mouth daily. 09/24/20 04/09/21  Noralee Stain, DO  Cranberry 450 MG CAPS Take 1 capsule by mouth 2 (two) times daily.    [provider]  diazepam (VALIUM) 2 MG tablet Take 0.5 tablets (1 mg total) by mouth 2 (two) times daily. 09/24/20   Noralee Stain, DO  diphenhydrAMINE (BENADRYL) 25 mg capsule Take 25 mg by mouth every 8 (eight) hours as needed for itching.    [provider]  docusate sodium (COLACE) 100 MG capsule Take 100 mg by mouth daily.     [provider]  ferrous sulfate 220 (44 Fe) MG/5ML solution Take 220 mg by mouth at bedtime.    [provider]  fluticasone (FLONASE) 50 MCG/ACT nasal spray  Place 1 spray into both nostrils daily.    [provider]  guaifenesin (ROBITUSSIN) 100 MG/5ML syrup Take 200 mg by mouth 4 (four) times daily as needed for cough.    [provider]  HYDROcodone-acetaminophen (NORCO/VICODIN) 5-325 MG tablet Take 1 tablet by mouth daily as needed for moderate pain. One tablet by mouth daily at lunch time as needed for breakthrough pain 09/24/20   Noralee Stain, DO  hydrocortisone cream 1 % Apply 1 application topically 2 (two) times daily as needed for itching. Apply to affected areas twice daily as needed for itching    [provider]  ibuprofen (ADVIL) 200 MG tablet Take 200 mg by mouth 2 (two) times daily.    [provider]  ketoconazole (NIZORAL) 2 % cream Apply 1 application topically 2 (two) times a week. Wednesday and Saturday. Shampoo hair twice weekly for treatment of dry scalp    [provider]  ketotifen (ZADITOR) 0.025 % ophthalmic solution Place 1 drop into both eyes 2 (two) times daily as needed (allergies).    [provider]  lidocaine (LIDODERM) 5 % Place 1 patch onto the skin daily. Remove & Discard patch within 12 hours or as directed by MD    [provider]  loperamide (IMODIUM A-D) 2 MG tablet Take 2 mg by mouth  4 (four) times daily as needed for diarrhea or loose stools. Give 4mg  po prn loose stools. May repeat 2mg  po tid for additional loose stools    [provider]  metoprolol tartrate (LOPRESSOR) 25 MG tablet Take 1 tablet (25 mg total) by mouth 2 (two) times daily. 09/24/20 04/09/21  Noralee Stain, DO  ondansetron (ZOFRAN) 4 MG tablet Take 4 mg by mouth every 8 (eight) hours as needed for nausea or vomiting.    [provider]  pantoprazole (PROTONIX) 40 MG tablet Take 1 tablet (40 mg total) by mouth 2 (two) times daily. 05/12/14   Geoffry Paradise, MD  Phenol [facility] [place]) Use as directed in the mouth or throat. One spray to throat every 2 hours as needed  for sore throad    [provider]  polyethylene glycol (MIRALAX / GLYCOLAX) packet Take 17 g by mouth daily.    [provider]  saccharomyces boulardii (FLORASTOR) 250 MG capsule Take 250 mg by mouth 2 (two) times daily.    [provider]  senna (SENOKOT) 8.6 MG tablet Take 1 tablet by mouth daily as needed for constipation.    [provider]  sucralfate (CARAFATE) 1 g tablet Take 1 g by mouth 2 (two) times daily.    [provider]  triamcinolone cream (KENALOG) 0.1 % Apply 1 application topically 2 (two) times daily as needed (rash).    [provider]      Allergies    Vancomycin    Review of Systems   Review of Systems  Physical Exam Updated Vital Signs BP 120/62 (BP Location: Left Arm)   Pulse (!) 50   Temp 98.2 F (36.8 C) (Oral)   Resp 18   Ht 1.575 m (5\' 2" )   Wt 53 kg   SpO2 93%   BMI 21.37 kg/m  Physical Exam Vitals and nursing note reviewed.  Constitutional:      Appearance: Normal appearance.  HENT:     Head: Normocephalic.     Right Ear: External ear normal.     Left Ear: External ear normal.     Nose: Nose normal.     Mouth/Throat:     Pharynx: Oropharynx is clear.  Eyes:     Extraocular Movements: Extraocular movements intact.     Pupils: Pupils are equal, round, and reactive to light.  Cardiovascular:     Rate and Rhythm: Normal rate.     Pulses: Normal pulses.  Pulmonary:     Effort: Pulmonary effort is normal.     Breath sounds: Normal breath sounds.  Abdominal:     General: Bowel sounds are normal.     Palpations: Abdomen is soft.  Musculoskeletal:     Cervical back: Normal range of motion.     Comments: Patient with contractures of lower extremities there is some tenderness to the left knee with a small abrasion Pelvis is stable No point tenderness in the right lower extremity or bilateral upper extremities Cervical, thoracic, and lumbar spine are palpated and no tenderness is noted   Skin:    General: Skin is warm and dry.     Capillary Refill: Capillary refill takes less than 2 seconds.  Neurological:     Mental Status: She is alert.  Psychiatric:        Mood and Affect: Mood normal.     ED Results / Procedures / Treatments   Labs (all labs ordered are listed, but only abnormal results are displayed) Labs Reviewed -  No data to display  EKG None  Radiology DG Knee Complete 4 Views Left  Result Date: 03/11/2023 CLINICAL DATA:  Pain after fall from wheelchair. EXAM: LEFT KNEE - COMPLETE 5 VIEW COMPARISON:  None Available. FINDINGS: The knee is located. Moderate osteopenia is present. No acute bone or soft tissue abnormalities are present. IMPRESSION: 1. No acute abnormality. 2. Moderate osteopenia. Electronically Signed   By: Marin Roberts M.D.   On: 03/11/2023 14:50   CT Head Wo Contrast  Result Date: 03/11/2023 CLINICAL DATA:  Fall from wheelchair EXAM: CT HEAD WITHOUT CONTRAST CT CERVICAL SPINE WITHOUT CONTRAST TECHNIQUE: Multidetector CT imaging of the head and cervical spine was performed following the standard protocol without intravenous contrast. Multiplanar CT image reconstructions of the cervical spine were also generated. RADIATION DOSE REDUCTION: This exam was performed according to the departmental dose-optimization program which includes automated exposure control, adjustment of the mA and/or kV according to patient size and/or use of iterative reconstruction technique. COMPARISON:  None Available. FINDINGS: CT HEAD FINDINGS Brain: No acute intracranial hemorrhage. No focal mass lesion. No CT evidence of acute infarction. No midline shift or mass effect. No hydrocephalus. Basilar cisterns are patent. There are periventricular and subcortical white matter hypodensities. Generalized cortical atrophy. Vascular: No hyperdense vessel or unexpected calcification. Skull: Normal. Negative for fracture or focal lesion. Sinuses/Orbits: Paranasal sinuses and  mastoid air cells are clear. Orbits are clear. Other: None. CT CERVICAL SPINE FINDINGS Alignment: Normal alignment of the cervical vertebral bodies. Skull base and vertebrae: Normal craniocervical junction. No loss of vertebral body height or disc height. Normal facet articulation. No evidence of fracture. Soft tissues and spinal canal: No prevertebral soft tissue swelling. No perispinal or epidural hematoma. Disc levels: Multilevel disc osteophytic disease. Facet hypertrophy and fusion. Upper chest: Clear Other: None IMPRESSION: 1. No intracranial trauma. 2. Atrophy and white matter microvascular disease. 3. No cervical spine fracture. Electronically Signed   By: Genevive Bi M.D.   On: 03/11/2023 12:46   CT Cervical Spine Wo Contrast  Result Date: 03/11/2023 CLINICAL DATA:  Fall from wheelchair EXAM: CT HEAD WITHOUT CONTRAST CT CERVICAL SPINE WITHOUT CONTRAST TECHNIQUE: Multidetector CT imaging of the head and cervical spine was performed following the standard protocol without intravenous contrast. Multiplanar CT image reconstructions of the cervical spine were also generated. RADIATION DOSE REDUCTION: This exam was performed according to the departmental dose-optimization program which includes automated exposure control, adjustment of the mA and/or kV according to patient size and/or use of iterative reconstruction technique. COMPARISON:  None Available. FINDINGS: CT HEAD FINDINGS Brain: No acute intracranial hemorrhage. No focal mass lesion. No CT evidence of acute infarction. No midline shift or mass effect. No hydrocephalus. Basilar cisterns are patent. There are periventricular and subcortical white matter hypodensities. Generalized cortical atrophy. Vascular: No hyperdense vessel or unexpected calcification. Skull: Normal. Negative for fracture or focal lesion. Sinuses/Orbits: Paranasal sinuses and mastoid air cells are clear. Orbits are clear. Other: None. CT CERVICAL SPINE FINDINGS Alignment:  Normal alignment of the cervical vertebral bodies. Skull base and vertebrae: Normal craniocervical junction. No loss of vertebral body height or disc height. Normal facet articulation. No evidence of fracture. Soft tissues and spinal canal: No prevertebral soft tissue swelling. No perispinal or epidural hematoma. Disc levels: Multilevel disc osteophytic disease. Facet hypertrophy and fusion. Upper chest: Clear Other: None IMPRESSION: 1. No intracranial trauma. 2. Atrophy and white matter microvascular disease. 3. No cervical spine fracture. Electronically Signed   By: Loura Halt.D.  On: 03/11/2023 12:46    Procedures Procedures    Medications Ordered in ED Medications - No data to display  ED Course/ Medical Decision Making/ A&P Clinical Course as of 03/11/23 1455  Sun Mar 11, 2023  1400 CT head and cervical spine x-rays reviewed interpreted and no evidence acute Gershon Mussel are noted [DR]  1453 X-Jousha Schwandt of knee with osteopenia and no acute abnormality noted [DR]    Clinical Course User Index [DR] Margarita Grizzle, MD                                 Medical Decision Making Amount and/or Complexity of Data Reviewed Radiology: ordered.           Final Clinical Impression(s) / ED Diagnoses Final diagnoses:  Fall, initial encounter  Contusion of scalp, initial encounter    Rx / DC Orders ED Discharge Orders     None         Margarita Grizzle, MD 03/11/23 1455

## 2024-04-30 DEATH — deceased
